# Patient Record
Sex: Male | Born: 1945
Health system: Southern US, Community
[De-identification: ages and names within clinical notes are randomized; demographics above are authoritative.]

## PROBLEM LIST (undated history)

## (undated) DIAGNOSIS — I7090 Unspecified atherosclerosis: Secondary | ICD-10-CM

## (undated) DIAGNOSIS — Z860100 Personal history of colon polyps, unspecified: Secondary | ICD-10-CM

## (undated) DIAGNOSIS — C609 Malignant neoplasm of penis, unspecified: Secondary | ICD-10-CM

## (undated) DIAGNOSIS — Z974 Presence of external hearing-aid: Secondary | ICD-10-CM

## (undated) DIAGNOSIS — K219 Gastro-esophageal reflux disease without esophagitis: Secondary | ICD-10-CM

## (undated) DIAGNOSIS — Z8719 Personal history of other diseases of the digestive system: Secondary | ICD-10-CM

## (undated) DIAGNOSIS — K449 Diaphragmatic hernia without obstruction or gangrene: Secondary | ICD-10-CM

## (undated) DIAGNOSIS — C801 Malignant (primary) neoplasm, unspecified: Secondary | ICD-10-CM

## (undated) DIAGNOSIS — R399 Unspecified symptoms and signs involving the genitourinary system: Secondary | ICD-10-CM

## (undated) DIAGNOSIS — N400 Enlarged prostate without lower urinary tract symptoms: Secondary | ICD-10-CM

## (undated) DIAGNOSIS — I440 Atrioventricular block, first degree: Secondary | ICD-10-CM

## (undated) DIAGNOSIS — N471 Phimosis: Secondary | ICD-10-CM

## (undated) DIAGNOSIS — Z8619 Personal history of other infectious and parasitic diseases: Secondary | ICD-10-CM

## (undated) DIAGNOSIS — I2510 Atherosclerotic heart disease of native coronary artery without angina pectoris: Secondary | ICD-10-CM

## (undated) DIAGNOSIS — C61 Malignant neoplasm of prostate: Secondary | ICD-10-CM

## (undated) DIAGNOSIS — K5792 Diverticulitis of intestine, part unspecified, without perforation or abscess without bleeding: Secondary | ICD-10-CM

## (undated) DIAGNOSIS — I10 Essential (primary) hypertension: Secondary | ICD-10-CM

## (undated) DIAGNOSIS — Z973 Presence of spectacles and contact lenses: Secondary | ICD-10-CM

## (undated) DIAGNOSIS — A630 Anogenital (venereal) warts: Secondary | ICD-10-CM

## (undated) DIAGNOSIS — G4730 Sleep apnea, unspecified: Secondary | ICD-10-CM

## (undated) DIAGNOSIS — I447 Left bundle-branch block, unspecified: Secondary | ICD-10-CM

## (undated) DIAGNOSIS — N138 Other obstructive and reflux uropathy: Secondary | ICD-10-CM

## (undated) DIAGNOSIS — N529 Male erectile dysfunction, unspecified: Secondary | ICD-10-CM

## (undated) HISTORY — DX: Malignant (primary) neoplasm, unspecified: C80.1

## (undated) HISTORY — DX: Essential (primary) hypertension: I10

## (undated) HISTORY — PX: PROSTATE SURGERY: SHX751

## (undated) HISTORY — DX: Gastro-esophageal reflux disease without esophagitis: K21.9

## (undated) HISTORY — PX: COLONOSCOPY: SHX174

## (undated) HISTORY — DX: Diverticulitis of intestine, part unspecified, without perforation or abscess without bleeding: K57.92

## (undated) HISTORY — PX: HERNIA REPAIR: SHX51

## (undated) HISTORY — PX: ABDOMINAL HERNIA REPAIR: SHX539

## (undated) HISTORY — DX: Unspecified atherosclerosis: I70.90

## (undated) HISTORY — DX: Malignant neoplasm of prostate: C61

## (undated) HISTORY — PX: OTHER SURGICAL HISTORY: SHX169

---

## 2000-08-22 ENCOUNTER — Encounter (INDEPENDENT_AMBULATORY_CARE_PROVIDER_SITE_OTHER): Payer: Self-pay

## 2000-08-22 ENCOUNTER — Encounter: Payer: Self-pay | Admitting: Gastroenterology

## 2000-08-22 ENCOUNTER — Encounter: Payer: Self-pay | Admitting: Internal Medicine

## 2000-08-22 ENCOUNTER — Ambulatory Visit (HOSPITAL_COMMUNITY): Admission: RE | Admit: 2000-08-22 | Discharge: 2000-08-22 | Payer: Self-pay | Admitting: Gastroenterology

## 2005-11-28 ENCOUNTER — Encounter: Payer: Self-pay | Admitting: Internal Medicine

## 2005-11-28 ENCOUNTER — Encounter: Payer: Self-pay | Admitting: Gastroenterology

## 2007-01-24 HISTORY — PX: PROSTATE SURGERY: SHX751

## 2007-01-24 HISTORY — PX: CT RADIATION THERAPY GUIDE: HXRAD513

## 2008-01-20 ENCOUNTER — Encounter: Payer: Self-pay | Admitting: Internal Medicine

## 2008-01-20 ENCOUNTER — Encounter: Payer: Self-pay | Admitting: Gastroenterology

## 2008-09-01 ENCOUNTER — Ambulatory Visit: Payer: Self-pay | Admitting: Gastroenterology

## 2008-09-02 ENCOUNTER — Encounter: Payer: Self-pay | Admitting: Gastroenterology

## 2008-09-10 ENCOUNTER — Ambulatory Visit: Payer: Self-pay | Admitting: Gastroenterology

## 2008-09-10 ENCOUNTER — Telehealth (INDEPENDENT_AMBULATORY_CARE_PROVIDER_SITE_OTHER): Payer: Self-pay

## 2008-09-10 DIAGNOSIS — K2970 Gastritis, unspecified, without bleeding: Secondary | ICD-10-CM | POA: Insufficient documentation

## 2008-09-10 DIAGNOSIS — K2990 Gastroduodenitis, unspecified, without bleeding: Secondary | ICD-10-CM

## 2008-09-10 LAB — CONVERTED CEMR LAB: UREASE: NEGATIVE

## 2008-09-11 ENCOUNTER — Encounter (INDEPENDENT_AMBULATORY_CARE_PROVIDER_SITE_OTHER): Payer: Self-pay

## 2008-09-15 ENCOUNTER — Encounter: Payer: Self-pay | Admitting: Gastroenterology

## 2008-10-05 ENCOUNTER — Telehealth: Payer: Self-pay | Admitting: Gastroenterology

## 2008-11-12 ENCOUNTER — Ambulatory Visit: Payer: Self-pay | Admitting: Gastroenterology

## 2008-11-12 DIAGNOSIS — R197 Diarrhea, unspecified: Secondary | ICD-10-CM | POA: Insufficient documentation

## 2008-11-12 DIAGNOSIS — K219 Gastro-esophageal reflux disease without esophagitis: Secondary | ICD-10-CM | POA: Insufficient documentation

## 2009-08-21 ENCOUNTER — Encounter: Payer: Self-pay | Admitting: Internal Medicine

## 2009-10-08 ENCOUNTER — Encounter: Admission: RE | Admit: 2009-10-08 | Discharge: 2009-10-08 | Payer: Self-pay | Admitting: Otolaryngology

## 2009-11-23 ENCOUNTER — Encounter: Payer: Self-pay | Admitting: Internal Medicine

## 2009-11-23 DIAGNOSIS — C61 Malignant neoplasm of prostate: Secondary | ICD-10-CM

## 2009-11-23 HISTORY — DX: Malignant neoplasm of prostate: C61

## 2009-11-30 ENCOUNTER — Ambulatory Visit
Admission: RE | Admit: 2009-11-30 | Discharge: 2010-01-10 | Payer: Self-pay | Source: Home / Self Care | Attending: Radiation Oncology | Admitting: Radiation Oncology

## 2009-12-20 ENCOUNTER — Telehealth (INDEPENDENT_AMBULATORY_CARE_PROVIDER_SITE_OTHER): Payer: Self-pay | Admitting: *Deleted

## 2009-12-21 ENCOUNTER — Encounter: Payer: Self-pay | Admitting: Internal Medicine

## 2009-12-21 ENCOUNTER — Ambulatory Visit: Payer: Self-pay | Admitting: Internal Medicine

## 2009-12-21 DIAGNOSIS — K4090 Unilateral inguinal hernia, without obstruction or gangrene, not specified as recurrent: Secondary | ICD-10-CM | POA: Insufficient documentation

## 2009-12-21 DIAGNOSIS — C61 Malignant neoplasm of prostate: Secondary | ICD-10-CM | POA: Insufficient documentation

## 2009-12-22 ENCOUNTER — Encounter: Payer: Self-pay | Admitting: Gastroenterology

## 2009-12-23 ENCOUNTER — Ambulatory Visit (HOSPITAL_COMMUNITY)
Admission: RE | Admit: 2009-12-23 | Discharge: 2009-12-23 | Payer: Self-pay | Source: Home / Self Care | Admitting: Surgery

## 2009-12-23 HISTORY — PX: INGUINAL HERNIA REPAIR: SUR1180

## 2009-12-24 ENCOUNTER — Telehealth: Payer: Self-pay | Admitting: Gastroenterology

## 2009-12-31 ENCOUNTER — Encounter: Payer: Self-pay | Admitting: Internal Medicine

## 2010-01-05 ENCOUNTER — Ambulatory Visit: Payer: Self-pay | Admitting: Internal Medicine

## 2010-01-11 ENCOUNTER — Encounter: Payer: Self-pay | Admitting: Internal Medicine

## 2010-01-28 ENCOUNTER — Encounter: Payer: Self-pay | Admitting: Internal Medicine

## 2010-02-14 ENCOUNTER — Ambulatory Visit
Admission: RE | Admit: 2010-02-14 | Discharge: 2010-02-14 | Payer: Self-pay | Source: Home / Self Care | Attending: Internal Medicine | Admitting: Internal Medicine

## 2010-02-14 ENCOUNTER — Ambulatory Visit
Admission: RE | Admit: 2010-02-14 | Discharge: 2010-02-22 | Payer: Self-pay | Source: Home / Self Care | Attending: Radiation Oncology | Admitting: Radiation Oncology

## 2010-02-22 NOTE — Miscellaneous (Signed)
Summary: omeprazole refill  Clinical Lists Changes  Medications: Changed medication from OMEPRAZOLE 40 MG  CPDR (OMEPRAZOLE) 1 each day 30 minutes before breakfast to OMEPRAZOLE 40 MG  CPDR (OMEPRAZOLE) 1 each day 30 minutes before breakfast NEEDS OFFICE VISIT FOR ANY FURTHER REFILLS!! - Signed Rx of OMEPRAZOLE 40 MG  CPDR (OMEPRAZOLE) 1 each day 30 minutes before breakfast NEEDS OFFICE VISIT FOR ANY FURTHER REFILLS!!;  #30 x 0;  Signed;  Entered by: Christie Nottingham CMA (AAMA);  Authorized by: Meryl Dare MD Clementeen Graham;  Method used: Electronically to [facility]*, [address], [place], [place]  [id number], Ph: [phone], Fax: [phone]    Prescriptions: OMEPRAZOLE 40 MG  CPDR (OMEPRAZOLE) 1 each day 30 minutes before breakfast NEEDS OFFICE VISIT FOR ANY FURTHER REFILLS!!  #30 x 0   Entered by:   Christie Nottingham CMA (AAMA)   Authorized by:   Meryl Dare MD [facility]   Signed by:   Christie Nottingham CMA Duncan Dull) on 12/22/2009   Method used:   Electronically to        [facility]* (retail)       [address]       [place], [place]  [address]       Ph: [phone]       Fax: [phone]   RxID:   [id number]

## 2010-02-22 NOTE — Progress Notes (Signed)
Summary: Medication  Phone Note Call from Patient Call back at Home Phone [phone]   Caller: Patient Call For: Dr. Russella Dar Reason for Call: Talk to Nurse Summary of Call: Wants to know why the dose was increased on his OMEPRAZOLE, says we used to call in 20mg  and now we are calling in 40mg  Initial call taken by: [place] Johnson,  December 24, 2009 8:55 AM  Follow-up for Phone Call        Pt was switched to omeprazole 40mg  at his last office visit. Pt is due for a yearly follow-up visit and informed that he needs to be seen in the office soon to get his yearly refills. Pt states he just had a  prostate biopsy yesterday and cannot move around much. He states he needs to see Dr. Debby Bud for a follow up anyway and wants to know if he can just see Dr. Debby Bud for this follow up. I told pt that Dr. Russella Dar states he can see his PCP for yearly follow up refills on his PPI instead of coming to see two doctors for refills and follow visits if that is easier on him. Pt agreed that it was and will call Dr. Debby Bud office for a follow-up visit.  Follow-up by: Christie Nottingham CMA Duncan Dull),  December 24, 2009 9:17 AM

## 2010-02-22 NOTE — Letter (Signed)
Summary: Appt Reminder 2  [facility]  [address] [place], [place] [address]   Phone: [phone]  Fax: [phone]        September 11, 2008 MRN: [id number]    Oscar Smith [address] [place], [place]  [address]    Dear Mr. Dascenzo,   You have a return appointment with Dr. Russella Dar on 11-12-08 at 8:45am.  Please remember to bring a complete list of the medicines you are taking, your insurance card and your co-pay.  If you have to cancel or reschedule this appointment, please call before 5:00 pm the evening before to avoid a cancellation fee.  If you have any questions or concerns, please call [phone].    Sincerely,    Darcey Nora RN, CGRN

## 2010-02-22 NOTE — Letter (Signed)
Summary: Patient [facility] Biopsy Results  [facility]  [address] [place], [place] [address]   Phone: [phone]  Fax: [phone]        September 15, 2008 MRN: [id number]    Oscar Smith [address] [place], [place]  [address]    Dear Mr. Lizardi,  I am pleased to inform you that the biopsies taken during your recent endoscopic examination did not show any evidence of H. pylori infection.  Continue with the treatment plan as outlined on the day of your      exam.  Please call us if you are having persistent problems or have questions about your condition that have not been fully answered at this time.  Sincerely,  Meryl Dare MD [facility]  This letter has been electronically signed by your physician.

## 2010-02-22 NOTE — Procedures (Signed)
Summary: EGD w/ biopsy/[facility]  EGD w/ biopsy/[facility]   Imported By: Sherian Rein 10/07/2008 09:48:01  _____________________________________________________________________  External Attachment:    Type:   Image     Comment:   External Document

## 2010-02-22 NOTE — Miscellaneous (Signed)
Summary: clotest  Clinical Lists Changes  Problems: Added new problem of GASTRITIS (ICD-535.50) Orders: Added new Test order of TLB-H Pylori Screen Gastric Biopsy (83013-CLOTEST) - Signed 

## 2010-02-22 NOTE — Miscellaneous (Signed)
Summary: Omeprazole order  Clinical Lists Changes  Medications: Added new medication of OMEPRAZOLE 40 MG  CPDR (OMEPRAZOLE) 1 each day 30 minutes before breakfast - Signed Rx of OMEPRAZOLE 40 MG  CPDR (OMEPRAZOLE) 1 each day 30 minutes before breakfast;  #30 x 11;  Signed;  Entered by: Doristine Church RN II;  Authorized by: Meryl Dare MD Clementeen Graham;  Method used: Electronically to [facility]*, [address], [place], [place]  [address], Ph: [phone], Fax: [phone] Observations: Added new observation of ALLERGY REV: Done (09/10/2008 10:16) Added new observation of NKA: T (09/10/2008 10:16)    Prescriptions: OMEPRAZOLE 40 MG  CPDR (OMEPRAZOLE) 1 each day 30 minutes before breakfast  #30 x 11   Entered by:   Doristine Church RN II   Authorized by:   Meryl Dare MD [facility]   Signed by:   Doristine Church RN II on 09/10/2008   Method used:   Electronically to        [facility]* (retail)       [address]       [place], [place]  [id number]       Ph: [phone]       Fax: [phone]   RxID:   [id number]

## 2010-02-22 NOTE — Assessment & Plan Note (Signed)
Summary: follow up EGD/Sheri   History of Present Illness Visit Type: Initial Visit Primary GI MD: Elie Goody MD [facility] Primary Provider: Elvina Sidle, MD Chief Complaint: Patient here today to follow up from an EGD, he states he is having no problems with acid reflux. He is against any rxs and would like to do only natural drug, he has changed his diet and feels much better. He is having trouble with diarrhea but states that he only has a BM once every 2 to 3 days. He has no abdominal pain and no blood in his stool.  History of Present Illness:   This is a return visit for erosive esophagitis. He has had excellent control of GERD with antireflux measures and daily omeprazole. He has a long history of intermittent diarrhea related to certain foods, and, since beginning a low fat diet he has not had any diarrhea.   GI Review of Systems      Denies abdominal pain, acid reflux, belching, bloating, chest pain, dysphagia with liquids, dysphagia with solids, heartburn, loss of appetite, nausea, vomiting, vomiting blood, weight loss, and  weight gain.      Reports diarrhea.     Denies anal fissure, black tarry stools, change in bowel habit, constipation, diverticulosis, fecal incontinence, heme positive stool, hemorrhoids, irritable bowel syndrome, jaundice, light color stool, liver problems, rectal bleeding, and  rectal pain. Preventive Screening-Counseling & Management  Alcohol-Tobacco     Smoking Status: quit  Caffeine-Diet-Exercise     Does Patient Exercise: yes      Drug Use:  no.     Current Medications (verified): 1)  Omeprazole 40 Mg  Cpdr (Omeprazole) .Marland Kitchen.. 1 Each Day 30 Minutes Before Breakfast 2)  Multivitamins  Tabs (Multiple Vitamin) .... Take One By Mouth Once Daily 3)  Omega-3 350 Mg Caps (Omega-3 Fatty Acids) .... Take One By Mouth Once Daily  Allergies (verified): No Known Drug Allergies  Past History:  Past Medical History: GERD/EE Hiatal  Hernia Diverticulosis Internal/external hemorrhoids  Past Surgical History: Unremarkable  Family History: Family History of Stomach Cancer: father died age 65 Family History of Colon Cancer: maternal nephews X 2  Social History: Patient is a former smoker. teens Alcohol Use - yes one or two beers a day Daily Caffeine Use dronks acid free coffee Illicit Drug Use - no Patient gets regular exercise. Smoking Status:  quit Drug Use:  no Does Patient Exercise:  yes  Review of Systems       The patient complains of allergy/sinus, hearing problems, heart rhythm changes, and urination - excessive.         The pertinent positives and negatives are noted as above and in the HPI. All other ROS were reviewed and were negative.   Vital Signs:  Patient profile:   65 year old male Height:      70 inches Weight:      185.6 pounds BMI:     26.73 Pulse rate:   80 / minute Pulse rhythm:   regular BP sitting:   120 / 78  (left arm) Cuff size:   regular  Vitals Entered By: Harlow Mares CMA Duncan Dull) (November 12, 2008 8:44 AM)  Physical Exam  General:  Well developed, well nourished, no acute distress. Head:  Normocephalic and atraumatic. Eyes:  PERRLA, no icterus. Mouth:  No deformity or lesions, dentition normal. Lungs:  Clear throughout to auscultation. Heart:  Regular rate and rhythm; no murmurs, rubs,  or bruits. Abdomen:  Soft, nontender and  nondistended. No masses, hepatosplenomegaly or hernias noted. Normal bowel sounds. Psych:  Alert and cooperative. Normal mood and affect.   Impression & Recommendations:  Problem # 1:  GERD (ICD-530.81) GERD with grade B. erosive esophagitis. We had a long discussion about the recurrent nature of her erosive esophagitis and potential complications, such as bleeding, strictures, or Barrett's esophagus. He is advised to remain on a proton pump inhibitor life long along with standard antireflux measures.  Problem # 2:  SCREENING  COLORECTAL-CANCER (ICD-V76.51) Colorectal cancer screening due November 2017.  Problem # 3:  DIARRHEA (ICD-787.91) Resolved with dietary changes. His diarrhea is most likely is caused by high-fat foods.  Patient Instructions: 1)  Please schedule a follow-up appointment in 1 year. 2)  Please continue current medications.  3)  Copy sent to : Elvina Sidle, MD 4)  The medication list was reviewed and reconciled.  All changed / newly prescribed medications were explained.  A complete medication list was provided to the patient / caregiver.  Appended Document: follow up EGD/Sheri    Clinical Lists Changes  Medications: Rx of OMEPRAZOLE 40 MG  CPDR (OMEPRAZOLE) 1 each day 30 minutes before breakfast;  #30 x 11;  Signed;  Entered by: Christie Nottingham CMA (AAMA);  Authorized by: Meryl Dare MD Clementeen Graham;  Method used: Electronically to [facility] [address] [place], [place]  [id number], Ph: [phone], Fax: [phone]    Prescriptions: OMEPRAZOLE 40 MG  CPDR (OMEPRAZOLE) 1 each day 30 minutes before breakfast  #30 x 11   Entered by:   Christie Nottingham CMA (AAMA)   Authorized by:   Meryl Dare MD [facility]   Signed by:   Christie Nottingham CMA Duncan Dull) on 11/12/2008   Method used:   Electronically to        [facility]* (retail)       [address]       [place], [place]  [address]       Ph: [phone]       Fax: [phone]   RxID:   [id number]

## 2010-02-22 NOTE — Procedures (Signed)
Summary: Endoscopy   EGD  Procedure date:  09/10/2008  Findings:      Location: [facility]   ENDOSCOPY PROCEDURE REPORT  PATIENT:  Oscar Smith  MR#:  [id number] BIRTHDATE:   1945/11/07, 63 yrs. old   GENDER:   male  ENDOSCOPIST:   Judie Petit T. Russella Dar, MD, [facility] Referred by: Elvina Sidle, M.D.  PROCEDURE DATE:  09/10/2008 PROCEDURE:  EGD with biopsy ASA CLASS:   Class II INDICATIONS: GERD   MEDICATIONS:   Fentanyl 50 mcg IV, Versed 7 mg IV TOPICAL ANESTHETIC:   Exactacain Spray  DESCRIPTION OF PROCEDURE:   After the risks benefits and alternatives of the procedure were thoroughly explained, informed consent was obtained.  The LB [id number]-H180 [id number] endoscope was introduced through the mouth and advanced to the second portion of the duodenum, without limitations.  The instrument was slowly withdrawn as the mucosa was fully examined. <<PROCEDUREIMAGES>>          <<OLD IMAGES>>  Esophagitis was found in the distal esophagus. It was linear arrayed and erosive.  LA Classification Grade B. Mild gastritis was found in the body of the stomach. It was erosive. A biopsy for H. pylori was taken.  Otherwise the examination was normal.   Retroflexed views revealed a hiatal hernia,  4 cm.  The scope was then withdrawn from the patient and the procedure completed.  COMPLICATIONS:   None  ENDOSCOPIC IMPRESSION:  1) Erosive esophagitis   2) Mild gastritis  3) A hiatal hernia  RECOMMENDATIONS:  1) await biopsy results  2) anti-reflux regimen  3) PPI qam indefinitely  4) follow-up: GI clinic 2 months     Samadhi Mahurin T. Russella Dar, MD, Clementeen Graham        Patient: Oscar Smith Note: All result statuses are Final unless otherwise noted.  Tests: (1) H. Pylori Screen Gastric Biopsy (CLOTEST)  H Pylori Screen Gastric Biopsy                             Negative                    Negative  Note: An exclamation mark (!) indicates a result that was not dispersed  into the flowsheet. Document Creation Date: 09/14/2008 5:09 PM _______________________________________________________________________  (1) Order result status: Final Collection or observation date-time: 09/10/2008 10:35 Requested date-time:  Receipt date-time: 09/14/2008 17:09 Reported date-time: 09/14/2008 17:10 Referring Physician:   Ordering Physician: Claudette Head [phone]) Specimen Source:  Source: Eduard Roux Order Number: [id number] Lab site:    Signed by Meryl Dare MD [facility] on 09/15/2008 at 9:41 AM    September 15, 2008 MRN: [id number]    [facility] [address] [place], [place]  [address]    Dear Mr. Mabile,  I am pleased to inform you that the biopsies taken during your recent endoscopic examination did not show any evidence of H. pylori infection.  Continue with the treatment plan as outlined on the day of your      exam.  Please call us if you are having persistent problems or have questions about your condition that have not been fully answered at this time.  Sincerely,  Meryl Dare MD [facility]  This letter has been electronically signed by your physician.   This report was created from the original endoscopy report, which was reviewed and signed by the above listed endoscopist.

## 2010-02-22 NOTE — Progress Notes (Signed)
Summary: meds not working  Phone Note Call from Patient Call back at Work Phone [phone]   Caller: Patient Call For: Oscar Smith Reason for Call: Talk to Nurse Summary of Call: Patient states that medication Omeprazole is not working , states that it makes his symptoms worse. Initial call taken by: Tawni Levy,  October 05, 2008 8:35 AM  Follow-up for Phone Call        patient states that the omeprazole gave him diarrhea, he stopped it over the weekend and did have some improvement. Patient is given samples of dexilant 60 mg and aciphex, he will try both and let us know what helps.  Patient  reports he has a hx of chronic diarrhea. He wonders if he has IBS.  I have offered to move up his appointment for 11-12-08, he wants to keep this appointment.  He will call back for RX of PPI that helps.   Follow-up by: Darcey Nora RN, CGRN,  October 05, 2008 9:27 AM    New/Updated Medications: DEXILANT 60 MG CPDR (DEXLANSOPRAZOLE) 1 by mouth ac breakfast ACIPHEX 20 MG  TBEC (RABEPRAZOLE SODIUM) Take 1 each day 30 minutes before meals

## 2010-02-22 NOTE — Procedures (Signed)
Summary: Colonoscopy/[facility]  Colonoscopy/[facility]   Imported By: Sherian Rein 10/07/2008 09:50:30  _____________________________________________________________________  External Attachment:    Type:   Image     Comment:   External Document

## 2010-02-22 NOTE — Miscellaneous (Signed)
Summary: Update colon recall  Clinical Lists Changes  Observations: Added new observation of COLONNXTDUE: 11/2015 (11/12/2008 8:52)

## 2010-02-22 NOTE — Letter (Signed)
Summary: Oscar Smith   Imported By: Lester Ivalee 12/27/2009 07:13:17  _____________________________________________________________________  External Attachment:    Type:   Image     Comment:   External Document

## 2010-02-22 NOTE — Progress Notes (Signed)
Summary: Handwritten notes/[facility]  Handwritten notes/[facility]   Imported By: Sherian Rein 10/07/2008 09:43:20  _____________________________________________________________________  External Attachment:    Type:   Image     Comment:   External Document

## 2010-02-22 NOTE — Procedures (Signed)
Summary: EGD/Msarc Shanon Ace MD  EGD/Msarc Shanon Ace MD   Imported By: Lester Coalmont 12/27/2009 07:29:25  _____________________________________________________________________  External Attachment:    Type:   Image     Comment:   External Document

## 2010-02-22 NOTE — Procedures (Signed)
Summary: Colon/Marc E Magod MD  Colon/Marc Shanon Ace MD   Imported By: Lester Hawthorne 12/27/2009 07:28:15  _____________________________________________________________________  External Attachment:    Type:   Image     Comment:   External Document

## 2010-02-22 NOTE — Procedures (Signed)
Summary: Colonoscopy w/ polypectomy/[facility]  Colonoscopy w/ polypectomy/[facility]   Imported By: Sherian Rein 10/07/2008 09:46:36  _____________________________________________________________________  External Attachment:    Type:   Image     Comment:   External Document

## 2010-02-22 NOTE — Letter (Signed)
Summary: [facility]   Imported By: Sherian Rein 10/07/2008 09:51:31  _____________________________________________________________________  External Attachment:    Type:   Image     Comment:   External Document

## 2010-02-22 NOTE — Miscellaneous (Signed)
Summary: [facility] Previsit/prep  Clinical Lists Changes  Observations: Added new observation of NKA: T (09/01/2008 9:02)

## 2010-02-22 NOTE — Procedures (Signed)
Summary: Colon/Marc E Magod MD  Colon/Marc Shanon Ace MD   Imported By: Lester Bacliff 12/27/2009 07:33:58  _____________________________________________________________________  External Attachment:    Type:   Image     Comment:   External Document

## 2010-02-22 NOTE — Assessment & Plan Note (Signed)
Summary: PER DR Albena Comes [place] WORK IN-NEW MEDICARE PT-#-PKG/OFF-STC   Vital Signs:  Patient profile:   65 year old male Height:      70 inches Weight:      188 pounds BMI:     27.07 O2 Sat:      98 % on Room air Temp:     98.5 degrees F oral Pulse rate:   75 / minute BP sitting:   138 / 80  (left arm) Cuff size:   regular  Vitals Entered By: Bill Salinas CMA (December 21, 2009 11:31 AM)  O2 Flow:  Room air  Primary Care Provider:  Elvina Sidle, MD   History of Present Illness: Patient presents to establish for on-going continuity care.  He has chronic hearing loss and does wear hearing aids.  He has had EGD with erosive esophagitis and is taking PPI therapy which he should take daily.  He has been diagnosed with low grade prostate cancer and is in the process of consultaiton with radiation oncology and his urologist re: potential treatment but will be waiting to January for repeat PSA  Patient has been diagnosed with a right inquinal hernia. He has an appointment with Dr. Luisa Hart next week but he is having increased pain at rest and with activity.  Preventive Screening-Counseling & Management  Alcohol-Tobacco     Alcohol drinks/day: 3     Alcohol type: wine     >5/day in last 3 mos: yes     Smoking Status: quit  Caffeine-Diet-Exercise     Caffeine use/day: 1 cup per day     Does Patient Exercise: no  Hep-HIV-STD-Contraception     Dental Visit-last 6 months yes     Sun Exposure-Excessive: no  Safety-Violence-Falls     Seat Belt Use: yes     Helmet Use: n/a     Firearms in the Home: no firearms in the home     Smoke Detectors: yes     Violence in the Home: no risk noted     Fall Risk: low fall risk  Current Medications (verified): 1)  Omeprazole 40 Mg  Cpdr (Omeprazole) .Marland Kitchen.. 1 Each Day 30 Minutes Before Breakfast 2)  Multivitamins  Tabs (Multiple Vitamin) .... Take One By Mouth Once Daily 3)  Omega-3 350 Mg Caps (Omega-3 Fatty Acids) .... Take One By Mouth Once  Daily  Allergies (verified): No Known Drug Allergies  Past History:  Past Medical History: INGUINAL HERNIA, RIGHT (ICD-550.90) ADENOCARCINOMA, PROSTATE, GLEASON GRADE 6 (ICD-185) DIARRHEA (ICD-787.91) GERD (ICD-530.81) SCREENING COLORECTAL-CANCER (ICD-V76.51) GASTRITIS (ICD-535.50)  Social History: Trained as an Art gallery manager Married ''75 - 11 years/divorced; married '86 no children Worked as a Sales promotion account executive in [place] Patient is a former smoker. teens Alcohol Use - yes one or two beers a day Daily Caffeine Use dronks acid free coffee Illicit Drug Use - no Patient gets regular exercise. Caffeine use/day:  1 cup per day Does Patient Exercise:  no Dental Care w/in 6 mos.:  yes Sun Exposure-Excessive:  no Seat Belt Use:  yes Fall Risk:  low fall risk  Review of Systems       The patient complains of abdominal pain.  The patient denies anorexia, fever, weight loss, weight gain, chest pain, dyspnea on exertion, muscle weakness, and abnormal bleeding.    Physical Exam  General:  alert, well-developed, well-nourished, and well-hydrated.   Head:  normocephalic and atraumatic.   Eyes:  vision grossly intact, pupils equal, and pupils round.   Abdomen:  soft  and normal bowel sounds.  large right inquinal hernia very tender to touch. On inquinal exam very hard and tender hernia that was not reducible. Neurologic:  alert & oriented X3, cranial nerves II-XII intact, strength normal in all extremities, and gait normal.   Skin:  turgor normal, color normal, and no rashes.   Cervical Nodes:  no anterior cervical adenopathy and no posterior cervical adenopathy.   Psych:  Oriented X3, memory intact for recent and remote, and good eye contact.     Impression & Recommendations:  Problem # 1:  INGUINAL HERNIA, RIGHT (ICD-550.90) acute problem with pain and an irreducible hernia on my exam  Plan to see Dr Jamey Ripa at walk in clinic this PM at 1500hrs patient aware  Problem # 2:  ADENOCARCINOMA,  PROSTATE, GLEASON GRADE 6 (ICD-185) Prudence is the key. Repeat PSA January. Research treatment options including robotic nerve sparing prostatectomy vs porbe guided XRT  Problem # 3:  GASTRITIS (ICD-535.50) EGD proven erosive esophagitis and gastritis. Currently asymptomatic  Plan - continue PPI therapy.  His updated medication list for this problem includes:    Omeprazole 40 Mg Cpdr (Omeprazole) .Marland Kitchen... 1 each day 30 minutes before breakfast  Complete Medication List: 1)  Omeprazole 40 Mg Cpdr (Omeprazole) .Marland Kitchen.. 1 each day 30 minutes before breakfast 2)  Multivitamins Tabs (Multiple vitamin) .... Take one by mouth once daily 3)  Omega-3 350 Mg Caps (Omega-3 fatty acids) .... Take one by mouth once daily   Orders Added: 1)  New Patient Level III [04540]

## 2010-02-22 NOTE — Progress Notes (Signed)
----   Converted from flag ---- ---- 12/15/2009 5:26 PM, Jacques Navy MD wrote: next week is fine  ---- 12/15/2009 12:17 PM, Ivar Bury wrote: Dr Debby Bud  --Oscar Smith will be a new medicare pt with a hernia /groin issue, requesting a prim care phy.--Your next new availability is 01/28/10.  Can I work pt in sooner?  Thanks for your reply ------------------------------ Gave pt appt--phone--:  12/21/09 @ 1110A w/Dr Debby Bud

## 2010-02-23 ENCOUNTER — Ambulatory Visit: Payer: Medicare Other | Attending: Radiation Oncology | Admitting: Radiation Oncology

## 2010-02-23 ENCOUNTER — Ambulatory Visit: Payer: Medicare Other | Admitting: Radiation Oncology

## 2010-02-23 DIAGNOSIS — Z510 Encounter for antineoplastic radiation therapy: Secondary | ICD-10-CM | POA: Insufficient documentation

## 2010-02-23 DIAGNOSIS — C61 Malignant neoplasm of prostate: Secondary | ICD-10-CM | POA: Insufficient documentation

## 2010-02-24 ENCOUNTER — Ambulatory Visit: Payer: Medicare Other | Admitting: Radiation Oncology

## 2010-02-24 NOTE — Letter (Signed)
Summary: [facility]   Imported By: Sherian Rein 01/25/2010 08:33:24  _____________________________________________________________________  External Attachment:    Type:   Image     Comment:   External Document

## 2010-02-24 NOTE — Letter (Signed)
Summary: [facility]   Imported By: Sherian Rein 01/25/2010 08:36:57  _____________________________________________________________________  External Attachment:    Type:   Image     Comment:   External Document

## 2010-02-24 NOTE — Assessment & Plan Note (Signed)
Summary: PER PT 2 WK FU---STC   Vital Signs:  Patient profile:   65 year old male Height:      70 inches Weight:      187 pounds BMI:     26.93 O2 Sat:      98 % on Room air Temp:     98.1 degrees F oral Pulse rate:   67 / minute BP sitting:   138 / 84  (left arm) Cuff size:   regular  Vitals Entered By: Bill Salinas CMA (January 05, 2010 11:03 AM)  O2 Flow:  Room air CC: follow-up visit after having surg on inguinal hernia surg/ ab   Primary Care Provider:  Rosalyn Gess. Gennesis Hogland  CC:  follow-up visit after having surg on inguinal hernia surg/ ab.  History of Present Illness: Patient recently seen as a new patient. He did have a chance to review office note with no additions or corrections.  In the interval since his visit he has had right inguinal  herniorrphy by Dr. Jamey Ripa. He did well and is healing nicely. He still feel some tenderness at the op site. He has questions about level of activity which is deferred to Dr. Jamey Ripa.  He continues to research options in regard to the diagnosis of prostate cancer. He located a urologist by internet review - Dr. Margo Aye in [place] with whom he has an appointment to discuss robotic surgery. I have suggested that for continuity of care he may want to consider seeing Dr. Laverle Patter here in [place] who is also an accomplished urological surgeon experienced in robotic procedures.  Current Medications (verified): 1)  Omeprazole 40 Mg  Cpdr (Omeprazole) .Marland Kitchen.. 1 Each Day 30 Minutes Before Breakfast Needs Office Visit For Any Further Refills!! 2)  Multivitamins  Tabs (Multiple Vitamin) .... Take One By Mouth Once Daily 3)  Omega-3 350 Mg Caps (Omega-3 Fatty Acids) .... Take One By Mouth Once Daily  Allergies (verified): No Known Drug Allergies PMH-FH-SH reviewed-no changes except otherwise noted  Review of Systems  The patient denies anorexia, fever, weight loss, chest pain, syncope, dyspnea on exertion, abdominal pain, hematuria, muscle weakness,  difficulty walking, abnormal bleeding, and enlarged lymph nodes.    Physical Exam  General:  Well-developed,well-nourished,in no acute distress; alert,appropriate and cooperative throughout examination Eyes:  pupils equal, pupils round, corneas and lenses clear, and no injection.   Neck:  supple and full ROM.   Lungs:  normal respiratory effort and normal breath sounds.   Heart:  normal rate and regular rhythm.   Abdomen:  soft, normal bowel sounds, and no guarding.  Well healed surgical scar right groin with a woody texture consistent with mesh repair  Msk:  normal ROM, no joint tenderness, and no joint swelling.   Pulses:  2+ Neurologic:  alert & oriented X3, cranial nerves II-XII intact, and gait normal.   Skin:  turgor normal and color normal.   Psych:  Oriented X3, memory intact for recent and remote, normally interactive, and not anxious appearing.     Impression & Recommendations:  Problem # 1:  INGUINAL HERNIA, RIGHT (ICD-550.90) Successful repain.  Problem # 2:  ADENOCARCINOMA, PROSTATE, GLEASON GRADE 6 (ICD-185) Patient is exploring his options. Based on our discussion - HPI - will refer him to Dr. Laverle Patter.  Orders: Urology Referral (Urology)  Complete Medication List: 1)  Omeprazole 40 Mg Cpdr (Omeprazole) .Marland Kitchen.. 1 each day 30 minutes before breakfast needs office visit for any further refills!! 2)  Multivitamins Tabs (Multiple vitamin) .Marland KitchenMarland KitchenMarland Kitchen  Take one by mouth once daily 3)  Omega-3 350 Mg Caps (Omega-3 fatty acids) .... Take one by mouth once daily Prescriptions: OMEPRAZOLE 40 MG  CPDR (OMEPRAZOLE) 1 each day 30 minutes before breakfast NEEDS OFFICE VISIT FOR ANY FURTHER REFILLS!!  #90 x 3   Entered and Authorized by:   Jacques Navy MD   Signed by:   Jacques Navy MD on 01/05/2010   Method used:   Electronically to        [facility]* (retail)       [address]       [place], [place]  [id number]       Ph: [phone]       Fax: [phone]    RxID:   [id number]    Orders Added: 1)  Urology Referral [Urology] 2)  Est. Patient Level III [24401]

## 2010-02-24 NOTE — Assessment & Plan Note (Signed)
Summary: FU  Natale Milch  #   Vital Signs:  Patient profile:   65 year old male Height:      70 inches Weight:      194 pounds BMI:     27.94 O2 Sat:      97 % on Room air Temp:     98.3 degrees F oral Pulse rate:   80 / minute BP sitting:   138 / 82  (left arm) Cuff size:   regular  Vitals Entered By: Bill Salinas CMA (February 14, 2010 3:04 PM)  O2 Flow:  Room air CC: 3 month f/u to discuss treatment for prostate cancer and discuss hernia/ ab   Primary Care Provider:  Rosalyn Gess. Deepa Barthel  CC:  3 month f/u to discuss treatment for prostate cancer and discuss hernia/ ab.  History of Present Illness: Mr. Carlisle Beers presents for follow-up. He has made a decision in regard to treatment for prostate cancer: he will go with XRT. He has had gold markers place in the prostate and will begin 35-40 XRT sessions Feb 1st. He is going for simulation this week.  Patient is about 8 weeks out from right inquinal hernia repair by Dr. Jamey Ripa. Mr. Stephannie Li reports that he will still have sharp pain with certain movement in the right inquinal area and feels like somthing pulls loose with stretching or flexing. He has had no percepitble bulge, no fever, no change in the surgical scar.  Current Medications (verified): 1)  Omeprazole 40 Mg  Cpdr (Omeprazole) .Marland Kitchen.. 1 Each Day 30 Minutes Before Breakfast Needs Office Visit For Any Further Refills!! 2)  Multivitamins  Tabs (Multiple Vitamin) .... Take One By Mouth Once Daily 3)  Omega-3 350 Mg Caps (Omega-3 Fatty Acids) .... Take One By Mouth Once Daily  Allergies (verified): No Known Drug Allergies PMH-FH-SH reviewed-no changes except otherwise noted  Review of Systems       The patient complains of abdominal pain.  The patient denies anorexia, fever, weight loss, chest pain, severe indigestion/heartburn, muscle weakness, suspicious skin lesions, and enlarged lymph nodes.    Physical Exam  General:  Well-developed,well-nourished,in no acute distress;  alert,appropriate and cooperative throughout examination Head:  no abnormalities observed.   Lungs:  normal respiratory effort and normal breath sounds.   Heart:  normal rate and regular rhythm.   Abdomen:  Well healed scar at right lower abdomen. Able to palpate stitch line vs mesh on palpation along the scar line. Right inquinal exam is normal with no bulge or tenderness.   Impression & Recommendations:  Problem # 1:  INGUINAL HERNIA, RIGHT (ICD-550.90) Patient with persistent discomfort and concern about whether he can resume physical activity. Exam is unremarkable.  Plan - Recommened that he contact Dr. Jamey Ripa with his questions and concerns.  Problem # 2:  ADENOCARCINOMA, PROSTATE, GLEASON GRADE 6 (ICD-185) Patient is planning to proceed with treatment as outlined in HPI.  Complete Medication List: 1)  Omeprazole 40 Mg Cpdr (Omeprazole) .Marland Kitchen.. 1 each day 30 minutes before breakfast needs office visit for any further refills!! 2)  Multivitamins Tabs (Multiple vitamin) .... Take one by mouth once daily 3)  Omega-3 350 Mg Caps (Omega-3 fatty acids) .... Take one by mouth once daily   Orders Added: 1)  Est. Patient Level III [[address]]

## 2010-02-24 NOTE — Consult Note (Signed)
Summary: [facility]   Imported By: Sherian Rein 02/08/2010 10:09:34  _____________________________________________________________________  External Attachment:    Type:   Image     Comment:   External Document

## 2010-04-05 LAB — CBC
HCT: 42.8 % (ref 39.0–52.0)
Hemoglobin: 15.4 g/dL (ref 13.0–17.0)
MCH: 32 pg (ref 26.0–34.0)
MCHC: 36 g/dL (ref 30.0–36.0)
MCV: 88.8 fL (ref 78.0–100.0)
Platelets: 171 10*3/uL (ref 150–400)
RBC: 4.82 MIL/uL (ref 4.22–5.81)
RDW: 12.4 % (ref 11.5–15.5)
WBC: 5.9 10*3/uL (ref 4.0–10.5)

## 2010-04-05 LAB — SURGICAL PCR SCREEN
MRSA, PCR: NEGATIVE
Staphylococcus aureus: POSITIVE — AB

## 2010-06-10 NOTE — Procedures (Signed)
[facility]  Patient:    Oscar Smith                 MRN: [id number] Proc. Date: 08/22/00 Adm. Date:  [id number] Attending:  Nelda Marseille                           Procedure Report  PROCEDURE:  Colonoscopy with polypectomy.  INDICATION:  Periodic diarrhea in a patient due for colonic screening. Consent was signed after risks, benefits, methods, and options were thoroughly discussed in the office.  MEDICATIONS:  Demerol 70, Versed 6.  PROCEDURE:  Rectal inspection was pertinent for external hemorrhoids, small. Digital examination was negative.  Video colonoscope was inserted, easily advanced around the colon to the cecum.  This did not require any abdominal pressure or any position changes.  Cecum was identified by the appendiceal orifice and ileocecal valve.  No obvious abnormality was seen on insertion. The scope was slowly withdrawn.  The prep was adequate.  There was some liquid stool that required washing and suctioning.   Cecum and ascending were normal. At the approximate level of the hepatic flexure, a tiny 1-2 mm polyp was seen and was cold-biopsied x 1.  The scope was further withdrawn.  In the rectal distal sigmoid area, were three tiny polyps, all of which were hot-biopsied x 1 or 2 and they were all put in the same container.  Once back in the rectum, the scope was retroflexed, pertinent for some small internal hemorrhoids.  The scope was straightened, readvanced a short ways up the sigmoid, air was suctioned, and scope removed.  The patient tolerated the procedure well.  There were no obvious immediate complication.  ENDOSCOPIC DIAGNOSES: 1. Internal and external hemorrhoids. 2. Three tiny rectum and distal sigmoid small polyps hot-biopsied. 3. Questionable tiny hepatic flexure polyp, cold-biopsied. 4. Otherwise within normal limits to the cecum.  PLAN:  Await pathology to determine future colonic screening.   Continue work-up with an EGD.  Please see that dictation for details. DD:  08/22/00 TD:  08/22/00 Job: [id number]

## 2010-06-30 ENCOUNTER — Ambulatory Visit
Admission: RE | Admit: 2010-06-30 | Discharge: 2010-06-30 | Disposition: A | Discharge status: Home / Self Care | Payer: Medicare Other | Source: Ambulatory Visit | Attending: Radiation Oncology | Admitting: Radiation Oncology

## 2010-09-05 ENCOUNTER — Encounter: Payer: Self-pay | Admitting: Internal Medicine

## 2010-09-05 ENCOUNTER — Ambulatory Visit (INDEPENDENT_AMBULATORY_CARE_PROVIDER_SITE_OTHER): Payer: Medicare Other | Admitting: Internal Medicine

## 2010-09-05 VITALS — BP 116/80 | HR 88 | Temp 97.2°F | Wt 184.0 lb

## 2010-09-05 DIAGNOSIS — R059 Cough, unspecified: Secondary | ICD-10-CM

## 2010-09-05 DIAGNOSIS — R05 Cough: Secondary | ICD-10-CM

## 2010-09-05 DIAGNOSIS — J157 Pneumonia due to Mycoplasma pneumoniae: Secondary | ICD-10-CM

## 2010-09-05 MED ORDER — AZITHROMYCIN 250 MG PO TABS: ORAL_TABLET | ORAL | Status: AC

## 2010-09-05 NOTE — Progress Notes (Signed)
  Subjective:    Patient ID: Oscar Smith, male    DOB: 1946-01-22, 65 y.o.   MRN: [id number]  HPI Patient presents with a 10 day h/o cough, dry and non-productive, sinus fullness, weakness and loss of energy. He has had intermittent low grade fevers. No rigors. No DOE. He has decreased exercise tolerance in general after six months of limited activity due to hernia repair and recovery and XRT for prostate cancer.   I have reviewed the patient's medical history in detail and updated the computerized patient record.      Review of Systems Review of Systems  Constitutional:  Positive for low grade fever,activity change. Negative for rigors or unexpected weight change.  HEENT:  Negative for hearing loss, ear pain, congestion, neck stiffness and postnasal drip. Negative for sore throat or swallowing problems. Negative for dental complaints.   Eyes: Negative for vision loss or change in visual acuity.  Respiratory: Negative for chest tightness and wheezing.   Cardiovascular: Negative for chest pain and palpitation. Positive for decreased exercise tolerance Gastrointestinal: No change in bowel habit. No bloating or gas. No reflux or indigestion Genitourinary: Negative for urgency, frequency, flank pain and difficulty urinating.  Musculoskeletal: Negative for myalgias, back pain, arthralgias and gait problem.  Neurological: Negative for dizziness, tremors, weakness and headaches.  Hematological: Negative for adenopathy.  Psychiatric/Behavioral: Negative for behavioral problems and dysphoric mood.       Objective:   Physical Exam Vitals noted Gen'l - a little haggard in appearance but in no acute distress HEENT - no sinus tenderness, C&S clear, throat clear Neck- supple Chest - CTAP Cor - RRR       Assessment & Plan:  mycoplasma pneumoniae - symptoms are c/w this diagnosis.  Plan - azithromycin as directed.

## 2010-09-06 ENCOUNTER — Telehealth: Payer: Self-pay | Admitting: Internal Medicine

## 2010-09-06 NOTE — Telephone Encounter (Signed)
Received copies from [facility] on 09/06/10. Forwarded  3pages to Dr. Debby Bud for review.

## 2010-09-16 ENCOUNTER — Telehealth: Payer: Self-pay

## 2010-09-16 MED ORDER — BENZONATATE 100 MG PO CAPS: 100.0000 mg | ORAL_CAPSULE | Freq: Three times a day (TID) | ORAL | Status: AC | PRN

## 2010-09-16 MED ORDER — PREDNISONE 10 MG PO TABS: ORAL_TABLET | ORAL | Status: DC

## 2010-09-16 NOTE — Telephone Encounter (Signed)
Patient called [facility] c/o continued cough and congestion after completeing rx. He would like to know what else MD suggest. Thanks

## 2010-09-16 NOTE — Telephone Encounter (Signed)
Per Scheduling, pt called back and has also been set up for Saturday clinic just incase MD feels that he needs to be seen

## 2010-09-16 NOTE — Telephone Encounter (Signed)
Called patient  - sounds like a tracheal irritative cough.   Plan - tessalon perles tid x 10           Prednisone 30mg  x 1, 20omg x 3, 10 mg x 6           Robitussin DM q6          OV next week if not better.

## 2010-09-17 ENCOUNTER — Ambulatory Visit: Payer: Medicare Other | Admitting: Family Medicine

## 2010-09-22 ENCOUNTER — Encounter: Payer: Self-pay | Admitting: Internal Medicine

## 2010-09-22 ENCOUNTER — Ambulatory Visit (INDEPENDENT_AMBULATORY_CARE_PROVIDER_SITE_OTHER): Payer: Medicare Other | Admitting: Internal Medicine

## 2010-09-22 DIAGNOSIS — J309 Allergic rhinitis, unspecified: Secondary | ICD-10-CM

## 2010-09-22 NOTE — Progress Notes (Signed)
  Subjective:    Patient ID: Oscar Smith, male    DOB: April 05, 1945, 65 y.o.   MRN: [id number]  HPI patinet has been seen for URI and then cyclical cough for which he is finishing prednisone and tessalon perles. He will still have drainage and cough and thinks he may have developed allergies. He also c/o burning mouth and is worry about thrush.   I have reviewed the patient's medical history in detail and updated the computerized patient record.    Review of Systems System review is negative for any constitutional, cardiac, pulmonary, GI or neuro symptoms or complaints     Objective:   Physical Exam Vitals noted Gen'l- WNWD white man  HEENT - no plaque on the tongue or buccal membranes.       Assessment & Plan:  Cough - finish prednisone and tessalon  Allergy - symptoms are behaving like allergy.  Plan - otc allegra or claritin.

## 2010-09-24 DIAGNOSIS — J309 Allergic rhinitis, unspecified: Secondary | ICD-10-CM | POA: Insufficient documentation

## 2010-10-19 ENCOUNTER — Ambulatory Visit: Payer: Medicare Other

## 2010-10-21 ENCOUNTER — Ambulatory Visit (INDEPENDENT_AMBULATORY_CARE_PROVIDER_SITE_OTHER): Payer: Medicare Other

## 2010-10-21 DIAGNOSIS — Z23 Encounter for immunization: Secondary | ICD-10-CM

## 2010-10-31 ENCOUNTER — Encounter: Payer: Medicare Other | Admitting: Internal Medicine

## 2010-12-22 ENCOUNTER — Ambulatory Visit (INDEPENDENT_AMBULATORY_CARE_PROVIDER_SITE_OTHER): Payer: Medicare Other | Admitting: Internal Medicine

## 2010-12-22 ENCOUNTER — Encounter: Payer: Self-pay | Admitting: Internal Medicine

## 2010-12-22 ENCOUNTER — Other Ambulatory Visit: Payer: Self-pay | Admitting: Internal Medicine

## 2010-12-22 ENCOUNTER — Other Ambulatory Visit (INDEPENDENT_AMBULATORY_CARE_PROVIDER_SITE_OTHER): Payer: Medicare Other

## 2010-12-22 DIAGNOSIS — K219 Gastro-esophageal reflux disease without esophagitis: Secondary | ICD-10-CM

## 2010-12-22 DIAGNOSIS — K2970 Gastritis, unspecified, without bleeding: Secondary | ICD-10-CM

## 2010-12-22 DIAGNOSIS — Z136 Encounter for screening for cardiovascular disorders: Secondary | ICD-10-CM

## 2010-12-22 DIAGNOSIS — Z0000 Encounter for general adult medical examination without abnormal findings: Secondary | ICD-10-CM

## 2010-12-22 DIAGNOSIS — L299 Pruritus, unspecified: Secondary | ICD-10-CM

## 2010-12-22 DIAGNOSIS — K4090 Unilateral inguinal hernia, without obstruction or gangrene, not specified as recurrent: Secondary | ICD-10-CM

## 2010-12-22 DIAGNOSIS — C61 Malignant neoplasm of prostate: Secondary | ICD-10-CM

## 2010-12-22 LAB — COMPREHENSIVE METABOLIC PANEL
ALT: 25 U/L (ref 0–53)
AST: 24 U/L (ref 0–37)
Albumin: 4 g/dL (ref 3.5–5.2)
Alkaline Phosphatase: 78 U/L (ref 39–117)
BUN: 13 mg/dL (ref 6–23)
CO2: 28 mEq/L (ref 19–32)
Calcium: 9.1 mg/dL (ref 8.4–10.5)
Chloride: 107 mEq/L (ref 96–112)
Creatinine, Ser: 1 mg/dL (ref 0.4–1.5)
GFR: 83.93 mL/min (ref >60.00)
Glucose, Bld: 91 mg/dL (ref 70–99)
Potassium: 4.3 mEq/L (ref 3.5–5.1)
Sodium: 140 mEq/L (ref 135–145)
Total Bilirubin: 0.7 mg/dL (ref 0.3–1.2)
Total Protein: 7.2 g/dL (ref 6.0–8.3)

## 2010-12-22 LAB — LIPID PANEL
Cholesterol: 208 mg/dL — ABNORMAL HIGH (ref 0–200)
HDL: 36.7 mg/dL — ABNORMAL LOW (ref >39.00)
Total CHOL/HDL Ratio: 6
Triglycerides: 186 mg/dL — ABNORMAL HIGH (ref 0.0–149.0)
VLDL: 37.2 mg/dL (ref 0.0–40.0)

## 2010-12-22 LAB — HEPATIC FUNCTION PANEL
ALT: 25 U/L (ref 0–53)
AST: 24 U/L (ref 0–37)
Albumin: 4 g/dL (ref 3.5–5.2)
Alkaline Phosphatase: 78 U/L (ref 39–117)
Bilirubin, Direct: 0.1 mg/dL (ref 0.0–0.3)
Total Bilirubin: 0.7 mg/dL (ref 0.3–1.2)
Total Protein: 7.2 g/dL (ref 6.0–8.3)

## 2010-12-22 LAB — LDL CHOLESTEROL, DIRECT: Direct LDL: 124.7 mg/dL

## 2010-12-22 MED ORDER — OMEPRAZOLE 40 MG PO CPDR: 40.0000 mg | DELAYED_RELEASE_CAPSULE | Freq: Every day | ORAL | Status: DC

## 2010-12-22 NOTE — Progress Notes (Signed)
Subjective:    Patient ID: Oscar Smith, male    DOB: [birth year], 65 y.o.   MRN: [id number]  HPI   The patient is here for annual Medicare wellness examination and management of other chronic and acute problems. He reports that he is doing well and has no active medical complaints. He is current with his urologist.   The risk factors are reflected in the social history.  The roster of all physicians providing medical care to patient - is listed in the Snapshot section of the chart.  Activities of daily living:  The patient is 100% inedpendent in all ADLs: dressing, toileting, feeding as well as independent mobility  Home safety : The patient has smoke detectors in the home. They wear seatbelts  No firearms at home. There is no violence in the home.   There is no risks for hepatitis, STDs or HIV. There is no   history of blood transfusion. They have no travel history to infectious disease endemic areas of the world.  The patient has seen their dentist in the last six month. They have seen their eye doctor in the last year. They admit to  hearing difficulty and have not had audiologic testing in the last year.  They do not  have excessive sun exposure. Discussed the need for sun protection: hats, long sleeves and use of sunscreen if there is significant sun exposure.   Diet: the importance of a healthy diet is discussed. They do have a healthy diet.  The patient has a regular exercise program: cardio , 30 min duration, 7 days per week.  The benefits of regular aerobic exercise were discussed.  Depression screen: there are no signs or vegative symptoms of depression- irritability, change in appetite, anhedonia, sadness/tearfullness.  Cognitive assessment: the patient manages all their financial and personal affairs and is actively engaged.He is engaged in his art and has taken up the piano.  The following portions of the patient's history were reviewed and updated as appropriate:  allergies, current medications, past family history, past medical history,  past surgical history, past social history  and problem list.  Past Medical History  Diagnosis Date  . Inguinal hernia     right  . Diarrhea   . GERD (gastroesophageal reflux disease)   . Gastritis   . Adenocarcinoma of prostate     Gleason Grade 6, XRT   Past Surgical History  Procedure Date  . Abdominal hernia repair    No family history on file. History   Social History  . Marital Status: Married    Spouse Name: N/A    Number of Children: N/A  . Years of Education: N/A   Occupational History  . Not on file.   Social History Main Topics  . Smoking status: Former Smoker    Quit date: 09/04/1965  . Smokeless tobacco: Never Used  . Alcohol Use: 10.5 oz/week    21 drink(s) per week  . Drug Use: No  . Sexually Active: Yes -- Male partner(s)   Other Topics Concern  . Not on file   Social History Narrative   Trained as an Art gallery manager. Married '75- 11 years, divorced; Married '86. No children. 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 Sculptor-life size figures by commission (see work in [employer] park.) Learning the piano. Patient get regular exercise.         Review of Systems Constitutional:  Negative for fever, chills, activity change and unexpected  weight change.  HEENT:  Negative for hearing loss, ear pain, congestion, neck stiffness and postnasal drip. Negative for sore throat or swallowing problems. Negative for dental complaints.   Eyes: Negative for vision loss or change in visual acuity.  Respiratory: Negative for chest tightness and wheezing. Negative for DOE.   Cardiovascular: Negative for chest pain or palpitations. No decreased exercise tolerance Gastrointestinal: No change in bowel habit. No bloating or gas. No reflux or indigestion Genitourinary: Negative for urgency, frequency, flank pain and difficulty urinating.   Musculoskeletal: Negative for myalgias, back pain, arthralgias and gait problem.  Neurological: Negative for dizziness, tremors, weakness and headaches.  Hematological: Negative for adenopathy.  Psychiatric/Behavioral: Negative for behavioral problems and dysphoric mood.       Objective:   Physical Exam Vital signs reviewed Gen'l: Well nourished well developed white male in no acute distress  HEENT: Head: Normocephalic and atraumatic. Right Ear: External ear normal. EAC/TM nl. Left Ear: External ear normal.  EAC/TM nl. Nose: Nose normal. Mouth/Throat: Oropharynx is clear and moist. Dentition - native, in good repair. No buccal or palatal lesions. Posterior pharynx clear. Eyes: Conjunctivae and sclera clear. EOM intact. Pupils are equal, round, and reactive to light. Right eye exhibits no discharge. Left eye exhibits no discharge. Neck: Normal range of motion. Neck supple. No JVD present. No tracheal deviation present. No thyromegaly present.  Cardiovascular: Normal rate, regular rhythm, no gallop, no friction rub, no murmur heard.      Quiet precordium. 2+ radial and DP pulses . No carotid bruits Pulmonary/Chest: Effort normal. No respiratory distress or increased WOB, no wheezes, no rales. No chest wall deformity or CVAT. Abdominal: Soft. Bowel sounds are normal in all quadrants. He exhibits no distension, no tenderness, no rebound or guarding, No heptosplenomegaly  Genitourinary: Deferred to Dr. Laverle Patter  Musculoskeletal: Normal range of motion. He exhibits no edema and no tenderness.       Small and large joints without redness, synovial thickening or deformity. Full range of motion preserved about all small, median and large joints.  Lymphadenopathy:    He has no cervical or supraclavicular adenopathy.  Neurological: He is alert and oriented to person, place, and time. CN II-XII intact. DTRs 2+ and symmetrical biceps, radial and patellar tendons. Cerebellar function normal with no tremor,  rigidity, normal gait and station.  Skin: Skin is warm and dry. No rash noted. No erythema.  Psychiatric: He has a normal mood and affect. His behavior is normal. Thought content normal.   Lab Results  Component Value Date   WBC 5.9 12/22/2009   HGB 15.4 12/22/2009   HCT 42.8 12/22/2009   PLT 171 12/22/2009   GLUCOSE 91 12/22/2010   CHOL 208* 12/22/2010   TRIG 186.0* 12/22/2010   HDL 36.70* 12/22/2010   LDLDIRECT 124.7 12/22/2010   ALT 25 12/22/2010   ALT 25 12/22/2010   AST 24 12/22/2010   AST 24 12/22/2010   NA 140 12/22/2010   K 4.3 12/22/2010   CL 107 12/22/2010   CREATININE 1.0 12/22/2010   BUN 13 12/22/2010   CO2 28 12/22/2010           Assessment & Plan:

## 2010-12-25 ENCOUNTER — Encounter: Payer: Self-pay | Admitting: Internal Medicine

## 2010-12-25 DIAGNOSIS — Z0000 Encounter for general adult medical examination without abnormal findings: Secondary | ICD-10-CM | POA: Insufficient documentation

## 2010-12-25 NOTE — Assessment & Plan Note (Signed)
Reviewed EGD report with the patient. Explained the long term risks of silent reflux: Tissue transformation that can be premalignant (Barrett's); strictures.  Plan - he is advised to take omeprazole 40mg  qAM routinely, benefit outweighs risk.

## 2010-12-25 NOTE — Assessment & Plan Note (Signed)
Interval medical history is negative for any new illness or event. Physical exam, sans prostate, is normal. Lab results are in normal range including LDL cholesterol which is better than goal of 130 or less. He is current for colorectal cancer screening. Immunizations: had singles vaccine, due Tdap and pneumonia vaccine.   In summary - a very nice, intertesing man who is medically stable He is encouraged to keep up his healthy ways. He will return as needed.

## 2010-12-25 NOTE — Assessment & Plan Note (Signed)
Patient reports he is doing well. He still follows with Dr. Laverle Patter who monitors his PSA. So far so good.

## 2010-12-25 NOTE — Assessment & Plan Note (Signed)
Doing well, no limitations in activity

## 2011-01-31 DIAGNOSIS — N529 Male erectile dysfunction, unspecified: Secondary | ICD-10-CM | POA: Diagnosis not present

## 2011-01-31 DIAGNOSIS — C61 Malignant neoplasm of prostate: Secondary | ICD-10-CM | POA: Diagnosis not present

## 2011-01-31 DIAGNOSIS — N393 Stress incontinence (female) (male): Secondary | ICD-10-CM | POA: Diagnosis not present

## 2011-03-08 DIAGNOSIS — H2510 Age-related nuclear cataract, unspecified eye: Secondary | ICD-10-CM | POA: Diagnosis not present

## 2011-04-19 DIAGNOSIS — J342 Deviated nasal septum: Secondary | ICD-10-CM | POA: Diagnosis not present

## 2011-04-19 DIAGNOSIS — R040 Epistaxis: Secondary | ICD-10-CM | POA: Diagnosis not present

## 2011-04-28 DIAGNOSIS — C61 Malignant neoplasm of prostate: Secondary | ICD-10-CM | POA: Diagnosis not present

## 2011-05-15 ENCOUNTER — Other Ambulatory Visit: Payer: Self-pay | Admitting: Dermatology

## 2011-05-15 DIAGNOSIS — D485 Neoplasm of uncertain behavior of skin: Secondary | ICD-10-CM | POA: Diagnosis not present

## 2011-05-15 DIAGNOSIS — L259 Unspecified contact dermatitis, unspecified cause: Secondary | ICD-10-CM | POA: Diagnosis not present

## 2011-05-15 DIAGNOSIS — D235 Other benign neoplasm of skin of trunk: Secondary | ICD-10-CM | POA: Diagnosis not present

## 2011-05-15 DIAGNOSIS — D239 Other benign neoplasm of skin, unspecified: Secondary | ICD-10-CM | POA: Diagnosis not present

## 2011-08-21 DIAGNOSIS — H6120 Impacted cerumen, unspecified ear: Secondary | ICD-10-CM | POA: Diagnosis not present

## 2011-09-01 ENCOUNTER — Telehealth: Payer: Self-pay | Admitting: Internal Medicine

## 2011-09-01 DIAGNOSIS — K219 Gastro-esophageal reflux disease without esophagitis: Secondary | ICD-10-CM

## 2011-09-01 NOTE — Telephone Encounter (Signed)
OK for referral - [facility] notified.

## 2011-09-01 NOTE — Telephone Encounter (Signed)
Pt request referral to Dr. Loreta Ave, GI([facility]) for stomach issues, Ph [phone], pt does has appt on 8/21 with Dr Debby Bud, would like referral to see Dr Ferrel Logan ASAP

## 2011-09-04 ENCOUNTER — Telehealth: Payer: Self-pay | Admitting: Internal Medicine

## 2011-09-04 NOTE — Telephone Encounter (Signed)
Pt requesting referral to Dr Charna Elizabeth for GI  Doctor--Also he says when in [place] he do not get diarrhea but in the [place] He gets diarrhea and he is concern about this.  Pt desire a call.

## 2011-09-04 NOTE — Telephone Encounter (Signed)
Called patient - he voices his concerns about the diarrhea issue. His father had stomach cancer, his sister was recently diagnosed with pancreatic cancer.  I told him we were working on the referral to Dr. Loreta Ave

## 2011-09-13 ENCOUNTER — Ambulatory Visit (INDEPENDENT_AMBULATORY_CARE_PROVIDER_SITE_OTHER): Payer: Medicare Other | Admitting: Internal Medicine

## 2011-09-13 ENCOUNTER — Encounter: Payer: Self-pay | Admitting: Internal Medicine

## 2011-09-13 VITALS — BP 138/80 | HR 68 | Temp 98.2°F | Resp 16 | Wt 192.0 lb

## 2011-09-13 DIAGNOSIS — R197 Diarrhea, unspecified: Secondary | ICD-10-CM

## 2011-09-13 NOTE — Progress Notes (Signed)
  Subjective:    Patient ID: Oscar Smith, male    DOB: 1945-02-21, 66 y.o.   MRN: [id number]  HPI Mr. Oscar Smith presetns to get a referral to Dr. Ranae Palms. Reviewed phone notes and referral records. All our work and communication was complete August 15th. He is cleared to accept appointment for August 22nd.    Review of Systems     Objective:   Physical Exam        Assessment & Plan:

## 2011-09-14 DIAGNOSIS — K648 Other hemorrhoids: Secondary | ICD-10-CM | POA: Diagnosis not present

## 2011-09-14 DIAGNOSIS — Z8379 Family history of other diseases of the digestive system: Secondary | ICD-10-CM | POA: Diagnosis not present

## 2011-09-14 DIAGNOSIS — R198 Other specified symptoms and signs involving the digestive system and abdomen: Secondary | ICD-10-CM | POA: Diagnosis not present

## 2011-09-14 DIAGNOSIS — K644 Residual hemorrhoidal skin tags: Secondary | ICD-10-CM | POA: Diagnosis not present

## 2011-09-14 DIAGNOSIS — K219 Gastro-esophageal reflux disease without esophagitis: Secondary | ICD-10-CM | POA: Diagnosis not present

## 2011-09-14 DIAGNOSIS — K5730 Diverticulosis of large intestine without perforation or abscess without bleeding: Secondary | ICD-10-CM | POA: Diagnosis not present

## 2011-10-12 ENCOUNTER — Encounter: Payer: Self-pay | Admitting: Gastroenterology

## 2011-10-12 DIAGNOSIS — Z8379 Family history of other diseases of the digestive system: Secondary | ICD-10-CM | POA: Diagnosis not present

## 2011-10-12 DIAGNOSIS — Z800 Family history of malignant neoplasm of digestive organs: Secondary | ICD-10-CM | POA: Diagnosis not present

## 2011-10-12 DIAGNOSIS — K5730 Diverticulosis of large intestine without perforation or abscess without bleeding: Secondary | ICD-10-CM | POA: Diagnosis not present

## 2011-10-12 DIAGNOSIS — K219 Gastro-esophageal reflux disease without esophagitis: Secondary | ICD-10-CM | POA: Diagnosis not present

## 2011-10-12 DIAGNOSIS — Z86010 Personal history of colonic polyps: Secondary | ICD-10-CM | POA: Diagnosis not present

## 2011-10-12 DIAGNOSIS — Z1211 Encounter for screening for malignant neoplasm of colon: Secondary | ICD-10-CM | POA: Diagnosis not present

## 2011-10-18 ENCOUNTER — Ambulatory Visit (INDEPENDENT_AMBULATORY_CARE_PROVIDER_SITE_OTHER): Payer: Medicare Other | Admitting: Physician Assistant

## 2011-10-18 DIAGNOSIS — Z0000 Encounter for general adult medical examination without abnormal findings: Secondary | ICD-10-CM

## 2011-10-18 DIAGNOSIS — Z23 Encounter for immunization: Secondary | ICD-10-CM | POA: Diagnosis not present

## 2011-11-13 DIAGNOSIS — N529 Male erectile dysfunction, unspecified: Secondary | ICD-10-CM | POA: Diagnosis not present

## 2011-11-13 DIAGNOSIS — N476 Balanoposthitis: Secondary | ICD-10-CM | POA: Diagnosis not present

## 2011-11-13 DIAGNOSIS — N393 Stress incontinence (female) (male): Secondary | ICD-10-CM | POA: Diagnosis not present

## 2011-11-13 DIAGNOSIS — C61 Malignant neoplasm of prostate: Secondary | ICD-10-CM | POA: Diagnosis not present

## 2011-11-15 DIAGNOSIS — D126 Benign neoplasm of colon, unspecified: Secondary | ICD-10-CM | POA: Diagnosis not present

## 2011-11-15 DIAGNOSIS — R198 Other specified symptoms and signs involving the digestive system and abdomen: Secondary | ICD-10-CM | POA: Diagnosis not present

## 2011-11-15 DIAGNOSIS — R197 Diarrhea, unspecified: Secondary | ICD-10-CM | POA: Diagnosis not present

## 2011-11-15 DIAGNOSIS — Z1211 Encounter for screening for malignant neoplasm of colon: Secondary | ICD-10-CM | POA: Diagnosis not present

## 2011-11-15 DIAGNOSIS — K5730 Diverticulosis of large intestine without perforation or abscess without bleeding: Secondary | ICD-10-CM | POA: Diagnosis not present

## 2011-11-22 ENCOUNTER — Ambulatory Visit (INDEPENDENT_AMBULATORY_CARE_PROVIDER_SITE_OTHER): Payer: Medicare Other | Admitting: Family Medicine

## 2011-11-22 VITALS — BP 150/91 | HR 69 | Temp 98.8°F | Resp 16 | Ht 70.0 in | Wt 191.0 lb

## 2011-11-22 DIAGNOSIS — R079 Chest pain, unspecified: Secondary | ICD-10-CM

## 2011-11-22 NOTE — Progress Notes (Signed)
66 yo who developed substernal chest pain last night which resolved with Maalox.  It recurred at noon today.    He had a colonoscopy last week.  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  Objective: NAD Chest:  Clear Heart:  Regular, no murmur Abdomen:  Soft, nontender, some fullness  EKG:  NSR  Assessment:  Most consistent with reflux.  I would like to do a complete physical in the next two weeks and patient will make an appointment.  Plan:  Continue the Maalox prn.  CPE

## 2011-12-04 ENCOUNTER — Ambulatory Visit (INDEPENDENT_AMBULATORY_CARE_PROVIDER_SITE_OTHER): Payer: Medicare Other | Admitting: Family Medicine

## 2011-12-04 ENCOUNTER — Encounter: Payer: Self-pay | Admitting: Family Medicine

## 2011-12-04 VITALS — BP 136/90 | HR 66 | Temp 97.9°F | Resp 16 | Ht 70.0 in | Wt 188.6 lb

## 2011-12-04 DIAGNOSIS — K219 Gastro-esophageal reflux disease without esophagitis: Secondary | ICD-10-CM | POA: Diagnosis not present

## 2011-12-04 DIAGNOSIS — E7800 Pure hypercholesterolemia, unspecified: Secondary | ICD-10-CM | POA: Diagnosis not present

## 2011-12-04 DIAGNOSIS — C61 Malignant neoplasm of prostate: Secondary | ICD-10-CM | POA: Diagnosis not present

## 2011-12-04 DIAGNOSIS — Z13220 Encounter for screening for lipoid disorders: Secondary | ICD-10-CM

## 2011-12-04 DIAGNOSIS — Z0000 Encounter for general adult medical examination without abnormal findings: Secondary | ICD-10-CM | POA: Diagnosis not present

## 2011-12-04 DIAGNOSIS — R030 Elevated blood-pressure reading, without diagnosis of hypertension: Secondary | ICD-10-CM | POA: Diagnosis not present

## 2011-12-04 LAB — POCT URINALYSIS DIPSTICK
Bilirubin, UA: NEGATIVE
Blood, UA: NEGATIVE
Glucose, UA: NEGATIVE
Leukocytes, UA: NEGATIVE
Nitrite, UA: NEGATIVE
Protein, UA: NEGATIVE
Spec Grav, UA: 1.02
Urobilinogen, UA: 0.2
pH, UA: 6.5

## 2011-12-04 LAB — COMPREHENSIVE METABOLIC PANEL
ALT: 24 U/L (ref 0–53)
AST: 21 U/L (ref 0–37)
Albumin: 4.2 g/dL (ref 3.5–5.2)
Alkaline Phosphatase: 76 U/L (ref 39–117)
BUN: 12 mg/dL (ref 6–23)
CO2: 27 mEq/L (ref 19–32)
Calcium: 9.3 mg/dL (ref 8.4–10.5)
Chloride: 101 mEq/L (ref 96–112)
Creat: 0.87 mg/dL (ref 0.50–1.35)
Glucose, Bld: 85 mg/dL (ref 70–99)
Potassium: 4 mEq/L (ref 3.5–5.3)
Sodium: 138 mEq/L (ref 135–145)
Total Bilirubin: 0.9 mg/dL (ref 0.3–1.2)
Total Protein: 7 g/dL (ref 6.0–8.3)

## 2011-12-04 LAB — LIPID PANEL
Cholesterol: 218 mg/dL — ABNORMAL HIGH (ref 0–200)
HDL: 38 mg/dL — ABNORMAL LOW (ref >39)
LDL Cholesterol: 146 mg/dL — ABNORMAL HIGH (ref 0–99)
Total CHOL/HDL Ratio: 5.7 Ratio
Triglycerides: 172 mg/dL — ABNORMAL HIGH (ref <150)
VLDL: 34 mg/dL (ref 0–40)

## 2011-12-04 LAB — IFOBT (OCCULT BLOOD): IFOBT: NEGATIVE

## 2011-12-04 LAB — POCT UA - MICROSCOPIC ONLY
Bacteria, U Microscopic: NEGATIVE
Casts, Ur, LPF, POC: NEGATIVE
Crystals, Ur, HPF, POC: NEGATIVE
Mucus, UA: NEGATIVE
WBC, Ur, HPF, POC: NEGATIVE
Yeast, UA: NEGATIVE

## 2011-12-04 NOTE — Progress Notes (Signed)
@[email or web address] @  Patient ID: Oscar Smith MRN: [id number], DOB: 07/05/1945 66 y.o. Date of Encounter: 12/04/2011, 3:38 PM  Primary Physician: Illene Regulus, MD  Chief Complaint: Physical (CPE)  HPI: 66 y.o. y/o male with history noted below here for CPE.  Doing well. No issues/complaints. Hearing loss:  Since childhood, followed by Dr. 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 Eyes: No visual changes, eye redness, or discharge. ENT/Mouth: Ears: No otalgia, tinnitus, hearing loss, discharge. Nose: No congestion, rhinorrhea, sinus pain, or epistaxis. Throat: No sore throat, post nasal drip, or teeth pain. Cardiovascular: No CP, palpitations, diaphoresis, DOE, edema, orthopnea, PND. Respiratory: No cough, hemoptysis, SOB, or wheezing. Gastrointestinal: No anorexia, dysphagia, reflux, pain, nausea, vomiting, hematemesis, diarrhea, constipation, BRBPR, or melena. Genitourinary: No dysuria, frequency, urgency, hematuria, incontinence, nocturia, decreased urinary stream, discharge, impotence, or testicular pain/masses. Musculoskeletal: No decreased ROM, myalgias, stiffness, joint swelling, or weakness. Skin: No rash, erythema, lesion changes, pain, warmth, jaundice, or pruritis. Neurological: No headache, dizziness, syncope, seizures, tremors, memory loss, coordination problems, or paresthesias. Psychological: No anxiety, depression, hallucinations, SI/HI. Endocrine: No fatigue, polydipsia, polyphagia, polyuria, or known diabetes. All other systems were reviewed and are otherwise negative.  Past Medical History  Diagnosis Date  . Inguinal hernia     right  . Diarrhea   . GERD (gastroesophageal reflux disease)   . Gastritis   .  Adenocarcinoma of prostate     Gleason Grade 6, XRT     Past Surgical History  Procedure Date  . Abdominal hernia repair   . Hernia repair   . Prostate surgery     Home Meds:  Prior to Admission medications   Medication Sig Start Date End Date Taking? Authorizing Provider  MULTIPLE VITAMIN PO Take by mouth.     Yes Historical Provider, MD  omeprazole (PRILOSEC) 40 MG capsule Take 1 capsule (40 mg total) by mouth daily. 12/22/10  Yes Jacques Navy, MD  OVER THE COUNTER MEDICATION OTC Ultraflora taking prn for IB   Yes Historical Provider, MD  fish oil-omega-3 fatty acids 1000 MG capsule Take 2 g by mouth daily.      Historical Provider, MD    Allergies: No Known Allergies  History   Social History  . Marital Status: Married    Spouse Name: N/A    Number of Children: N/A  . Years of Education: N/A   Occupational History  . Not on file.   Social History Main Topics  . Smoking status: Former Smoker    Quit date: 09/04/1965  . Smokeless tobacco: Never Used  . Alcohol Use: 10.5 oz/week    21 drink(s) per week  . Drug Use: No  . Sexually Active: Yes -- Male partner(s)   Other Topics Concern  . Not on file   Social History Narrative   Trained as an Art gallery manager. Married '75- 11 years, divorced; Married '86. No children. 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 Sculptor-life size figures by commission (see work in [employer] [place].) Learning the piano. Patient get regular exercise.    Family History  Problem Relation Age of Onset  . Stomach cancer Father   . Pancreatic cancer Sister   . Cancer Sister     Physical Exam: Blood pressure 136/90, pulse 66, temperature 97.9  F (36.6 C), temperature source Oral, resp. rate 16, height 5\' 10"  (1.778 m), weight 188 lb 9.6 oz (85.548 kg), SpO2 98.00%.  General: Well developed, well nourished, in no acute distress. HEENT: Normocephalic, atraumatic. Conjunctiva pink,  sclera non-icteric. Pupils 2 mm constricting to 1 mm, round, regular, and equally reactive to light and accomodation. EOMI. Internal auditory canal clear. TMs with good cone of light and without pathology. Nasal mucosa pink. Nares are without discharge. No sinus tenderness. Oral mucosa pink. Dentition fair. Pharynx without exudate.   Neck: Supple. Trachea midline. No thyromegaly. Full ROM. No lymphadenopathy. Lungs: Clear to auscultation bilaterally without wheezes, rales, or rhonchi. Breathing is of normal effort and unlabored.  Prominent swelling left sterno clavicular joint Cardiovascular: RRR with S1 S2. No murmurs, rubs, or gallops appreciated. Distal pulses 2+ symmetrically. No carotid or abdominal bruits Abdomen: Soft, non-tender, non-distended with normoactive bowel sounds. No hepatosplenomegaly or masses. No rebound/guarding. No CVA tenderness. Without hernias.  Rectal: No external hemorrhoids or fissures. Rectal vault without masses.  Genitourinary:  uncircumcised male. No penile lesions. Testes descended bilaterally, and smooth without tenderness or masses.  Musculoskeletal: Full range of motion and 5/5 strength throughout. Without swelling, atrophy, tenderness, crepitus, or warmth. Extremities without clubbing, cyanosis, or edema. Calves supple. Skin: Warm and moist without erythema, ecchymosis, wounds, or rash. Neuro: A+Ox3. CN II-XII grossly intact. Moves all extremities spontaneously. Full sensation throughout. Normal gait. DTR 2+ throughout upper and lower extremities. Finger to nose intact. Psych:  Responds to questions appropriately with a normal affect.   Studies: CBC, CMET, Lipid,  UA:   Assessment/Plan:  66 y.o. y/o  male here for CPE - 1. Annual physical exam     Signed, Elvina Sidle, MD 12/04/2011 3:38 PM

## 2011-12-05 LAB — CBC WITH DIFFERENTIAL/PLATELET
Basophils Absolute: 0 10*3/uL (ref 0.0–0.1)
Basophils Relative: 1 % (ref 0–1)
Eosinophils Absolute: 0.1 10*3/uL (ref 0.0–0.7)
Eosinophils Relative: 2 % (ref 0–5)
HCT: 42.8 % (ref 39.0–52.0)
Hemoglobin: 15.2 g/dL (ref 13.0–17.0)
Lymphocytes Relative: 31 % (ref 12–46)
Lymphs Abs: 1.6 10*3/uL (ref 0.7–4.0)
MCH: 31.7 pg (ref 26.0–34.0)
MCHC: 35.5 g/dL (ref 30.0–36.0)
MCV: 89.4 fL (ref 78.0–100.0)
Monocytes Absolute: 0.4 10*3/uL (ref 0.1–1.0)
Monocytes Relative: 8 % (ref 3–12)
Neutro Abs: 3.1 10*3/uL (ref 1.7–7.7)
Neutrophils Relative %: 58 % (ref 43–77)
Platelets: 192 10*3/uL (ref 150–400)
RBC: 4.79 MIL/uL (ref 4.22–5.81)
RDW: 13.5 % (ref 11.5–15.5)
WBC: 5.3 10*3/uL (ref 4.0–10.5)

## 2011-12-08 DIAGNOSIS — R350 Frequency of micturition: Secondary | ICD-10-CM | POA: Diagnosis not present

## 2011-12-12 DIAGNOSIS — J310 Chronic rhinitis: Secondary | ICD-10-CM | POA: Diagnosis not present

## 2011-12-12 DIAGNOSIS — H6120 Impacted cerumen, unspecified ear: Secondary | ICD-10-CM | POA: Diagnosis not present

## 2011-12-12 DIAGNOSIS — H903 Sensorineural hearing loss, bilateral: Secondary | ICD-10-CM | POA: Diagnosis not present

## 2011-12-12 DIAGNOSIS — J342 Deviated nasal septum: Secondary | ICD-10-CM | POA: Diagnosis not present

## 2011-12-25 DIAGNOSIS — N529 Male erectile dysfunction, unspecified: Secondary | ICD-10-CM | POA: Diagnosis not present

## 2012-01-03 DIAGNOSIS — N529 Male erectile dysfunction, unspecified: Secondary | ICD-10-CM | POA: Diagnosis not present

## 2012-01-03 DIAGNOSIS — R350 Frequency of micturition: Secondary | ICD-10-CM | POA: Diagnosis not present

## 2012-01-08 DIAGNOSIS — J328 Other chronic sinusitis: Secondary | ICD-10-CM | POA: Diagnosis not present

## 2012-01-11 ENCOUNTER — Other Ambulatory Visit: Payer: Self-pay | Admitting: Otolaryngology

## 2012-01-11 DIAGNOSIS — J324 Chronic pansinusitis: Secondary | ICD-10-CM

## 2012-01-18 DIAGNOSIS — C61 Malignant neoplasm of prostate: Secondary | ICD-10-CM | POA: Diagnosis not present

## 2012-01-18 DIAGNOSIS — N529 Male erectile dysfunction, unspecified: Secondary | ICD-10-CM | POA: Diagnosis not present

## 2012-01-19 ENCOUNTER — Other Ambulatory Visit: Payer: Medicare Other

## 2012-01-25 ENCOUNTER — Encounter: Payer: Medicare Other | Admitting: Internal Medicine

## 2012-02-06 ENCOUNTER — Ambulatory Visit
Admission: RE | Admit: 2012-02-06 | Discharge: 2012-02-06 | Disposition: A | Discharge status: Home / Self Care | Payer: Medicare Other | Source: Ambulatory Visit | Attending: Otolaryngology | Admitting: Otolaryngology

## 2012-02-06 DIAGNOSIS — J329 Chronic sinusitis, unspecified: Secondary | ICD-10-CM | POA: Diagnosis not present

## 2012-02-06 DIAGNOSIS — J324 Chronic pansinusitis: Secondary | ICD-10-CM

## 2012-02-14 DIAGNOSIS — J328 Other chronic sinusitis: Secondary | ICD-10-CM | POA: Diagnosis not present

## 2012-02-20 DIAGNOSIS — J3489 Other specified disorders of nose and nasal sinuses: Secondary | ICD-10-CM | POA: Diagnosis not present

## 2012-02-20 DIAGNOSIS — J329 Chronic sinusitis, unspecified: Secondary | ICD-10-CM | POA: Diagnosis not present

## 2012-02-20 DIAGNOSIS — J342 Deviated nasal septum: Secondary | ICD-10-CM | POA: Diagnosis not present

## 2012-02-20 DIAGNOSIS — J341 Cyst and mucocele of nose and nasal sinus: Secondary | ICD-10-CM | POA: Insufficient documentation

## 2012-02-20 DIAGNOSIS — B9689 Other specified bacterial agents as the cause of diseases classified elsewhere: Secondary | ICD-10-CM | POA: Insufficient documentation

## 2012-03-13 DIAGNOSIS — H2510 Age-related nuclear cataract, unspecified eye: Secondary | ICD-10-CM | POA: Diagnosis not present

## 2012-04-12 DIAGNOSIS — C61 Malignant neoplasm of prostate: Secondary | ICD-10-CM | POA: Diagnosis not present

## 2012-04-16 DIAGNOSIS — J329 Chronic sinusitis, unspecified: Secondary | ICD-10-CM | POA: Diagnosis not present

## 2012-04-16 DIAGNOSIS — J342 Deviated nasal septum: Secondary | ICD-10-CM | POA: Diagnosis not present

## 2012-04-19 DIAGNOSIS — C61 Malignant neoplasm of prostate: Secondary | ICD-10-CM | POA: Diagnosis not present

## 2012-06-10 ENCOUNTER — Ambulatory Visit (INDEPENDENT_AMBULATORY_CARE_PROVIDER_SITE_OTHER): Payer: Medicare Other | Admitting: Family Medicine

## 2012-06-10 VITALS — BP 158/90 | HR 78 | Temp 98.7°F | Resp 18 | Ht 70.0 in | Wt 188.0 lb

## 2012-06-10 DIAGNOSIS — K5732 Diverticulitis of large intestine without perforation or abscess without bleeding: Secondary | ICD-10-CM

## 2012-06-10 DIAGNOSIS — R1032 Left lower quadrant pain: Secondary | ICD-10-CM

## 2012-06-10 LAB — POCT CBC
Granulocyte percent: 75.1 %G (ref 37–80)
HCT, POC: 43.6 % (ref 43.5–53.7)
Hemoglobin: 14.2 g/dL (ref 14.1–18.1)
Lymph, poc: 1.5 (ref 0.6–3.4)
MCH, POC: 31.7 pg — AB (ref 27–31.2)
MCHC: 32.6 g/dL (ref 31.8–35.4)
MCV: 97.4 fL — AB (ref 80–97)
MID (cbc): 0.6 (ref 0–0.9)
MPV: 9.7 fL (ref 0–99.8)
POC Granulocyte: 6.4 (ref 2–6.9)
POC LYMPH PERCENT: 17.5 %L (ref 10–50)
POC MID %: 7.4 %M (ref 0–12)
Platelet Count, POC: 168 10*3/uL (ref 142–424)
RBC: 4.48 M/uL — AB (ref 4.69–6.13)
RDW, POC: 12.8 %
WBC: 8.5 10*3/uL (ref 4.6–10.2)

## 2012-06-10 LAB — POCT URINALYSIS DIPSTICK
Bilirubin, UA: NEGATIVE
Blood, UA: NEGATIVE
Glucose, UA: NEGATIVE
Ketones, UA: NEGATIVE
Leukocytes, UA: NEGATIVE
Nitrite, UA: NEGATIVE
Protein, UA: NEGATIVE
Spec Grav, UA: 1.015
Urobilinogen, UA: 0.2
pH, UA: 5.5

## 2012-06-10 LAB — POCT UA - MICROSCOPIC ONLY
Bacteria, U Microscopic: NEGATIVE
Casts, Ur, LPF, POC: NEGATIVE
Crystals, Ur, HPF, POC: NEGATIVE
Epithelial cells, urine per micros: NEGATIVE
Mucus, UA: NEGATIVE
RBC, urine, microscopic: NEGATIVE
WBC, Ur, HPF, POC: NEGATIVE
Yeast, UA: NEGATIVE

## 2012-06-10 LAB — POCT SEDIMENTATION RATE: POCT SED RATE: 30 mm/hr — AB (ref 0–22)

## 2012-06-10 MED ORDER — METRONIDAZOLE 500 MG PO TABS: 500.0000 mg | ORAL_TABLET | Freq: Three times a day (TID) | ORAL | Status: DC

## 2012-06-10 MED ORDER — CIPROFLOXACIN HCL 500 MG PO TABS: 500.0000 mg | ORAL_TABLET | Freq: Two times a day (BID) | ORAL | Status: DC

## 2012-06-10 NOTE — Progress Notes (Signed)
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  When he arrived home last Friday, he noted daily diarrhea and periumbilical abdominal pain.  No blood in the diarrhea.  No night sweats or fever.  He has chronic urinary leakage following the surgery for prostate cancer.  Objective:  NAD Alert, appropriate Chest:  Clear Heart:  Reg, no murmur Abdomen:  Soft, tender LLQ with palpation, no masses or HSM, no rebound Results for orders placed in visit on 06/10/12  POCT UA - MICROSCOPIC ONLY      Result Value Range   WBC, Ur, HPF, POC neg     RBC, urine, microscopic neg     Bacteria, U Microscopic neg     Mucus, UA neg     Epithelial cells, urine per micros neg     Crystals, Ur, HPF, POC neg     Casts, Ur, LPF, POC neg     Yeast, UA neg    POCT URINALYSIS DIPSTICK      Result Value Range   Color, UA yellow     Clarity, UA clear     Glucose, UA neg     Bilirubin, UA neg     Ketones, UA neg     Spec Grav, UA 1.015     Blood, UA neg     pH, UA 5.5     Protein, UA neg     Urobilinogen, UA 0.2     Nitrite, UA neg     Leukocytes, UA Negative    POCT CBC      Result Value Range   WBC 8.5  4.6 - 10.2 K/uL   Lymph, poc 1.5  0.6 - 3.4   POC LYMPH PERCENT 17.5  10 - 50 %L   MID (cbc) 0.6  0 - 0.9   POC MID % 7.4  0 - 12 %M   POC Granulocyte 6.4  2 - 6.9   Granulocyte percent 75.1  37 - 80 %G   RBC 4.48 (*) 4.69 - 6.13 M/uL   Hemoglobin 14.2  14.1 - 18.1 g/dL   HCT, POC 95.6  21.3 - 53.7 %   MCV 97.4 (*) 80 - 97 fL   MCH, POC 31.7 (*) 27 - 31.2 pg   MCHC 32.6  31.8 - 35.4 g/dL   RDW, POC 08.6     Platelet Count, POC 168  142 - 424 K/uL   MPV 9.7  0 - 99.8 fL  POCT SEDIMENTATION RATE      Result Value Range   POCT SED RATE    0 - 22 mm/hr      Assessment:  Diverticulitis, acute without evidence of abscess formation.    Plan:  Follow up 48 hours

## 2012-06-10 NOTE — Patient Instructions (Addendum)
Diverticulitis °A diverticulum is a small pouch or sac on the colon. Diverticulosis is the presence of these diverticula on the colon. Diverticulitis is the irritation (inflammation) or infection of diverticula. °CAUSES  °The colon and its diverticula contain bacteria. If food particles block the tiny opening to a diverticulum, the bacteria inside can grow and cause an increase in pressure. This leads to infection and inflammation and is called diverticulitis. °SYMPTOMS  °· Abdominal pain and tenderness. Usually, the pain is located on the left side of your abdomen. However, it could be located elsewhere. °· Fever. °· Bloating. °· Feeling sick to your stomach (nausea). °· Throwing up (vomiting). °· Abnormal stools. °DIAGNOSIS  °Your caregiver will take a history and perform a physical exam. Since many things can cause abdominal pain, other tests may be necessary. Tests may include: °· Blood tests. °· Urine tests. °· X-ray of the abdomen. °· CT scan of the abdomen. °Sometimes, surgery is needed to determine if diverticulitis or other conditions are causing your symptoms. °TREATMENT  °Most of the time, you can be treated without surgery. Treatment includes: °· Resting the bowels by only having liquids for a few days. As you improve, you will need to eat a low-fiber diet. °· Intravenous (IV) fluids if you are losing body fluids (dehydrated). °· Antibiotic medicines that treat infections may be given. °· Pain and nausea medicine, if needed. °· Surgery if the inflamed diverticulum has burst. °HOME CARE INSTRUCTIONS  °· Try a clear liquid diet (broth, tea, or water for as long as directed by your caregiver). You may then gradually begin a low-fiber diet as tolerated. A low-fiber diet is a diet with less than 10 grams of fiber. Choose the foods below to reduce fiber in the diet: °· White breads, cereals, rice, and pasta. °· Cooked fruits and vegetables or soft fresh fruits and vegetables without the skin. °· Ground or  well-cooked tender beef, ham, veal, lamb, pork, or poultry. °· Eggs and seafood. °· After your diverticulitis symptoms have improved, your caregiver may put you on a high-fiber diet. A high-fiber diet includes 14 grams of fiber for every 1000 calories consumed. For a standard 2000 calorie diet, you would need 28 grams of fiber. Follow these diet guidelines to help you increase the fiber in your diet. It is important to slowly increase the amount fiber in your diet to avoid gas, constipation, and bloating. °· Choose whole-grain breads, cereals, pasta, and brown rice. °· Choose fresh fruits and vegetables with the skin on. Do not overcook vegetables because the more vegetables are cooked, the more fiber is lost. °· Choose more nuts, seeds, legumes, dried peas, beans, and lentils. °· Look for food products that have greater than 3 grams of fiber per serving on the Nutrition Facts label. °· Take all medicine as directed by your caregiver. °· If your caregiver has given you a follow-up appointment, it is very important that you go. Not going could result in lasting (chronic) or permanent injury, pain, and disability. If there is any problem keeping the appointment, call to reschedule. °SEEK MEDICAL CARE IF:  °· Your pain does not improve. °· You have a hard time advancing your diet beyond clear liquids. °· Your bowel movements do not return to normal. °SEEK IMMEDIATE MEDICAL CARE IF:  °· Your pain becomes worse. °· You have an oral temperature above 102° F (38.9° C), not controlled by medicine. °· You have repeated vomiting. °· You have bloody or black, tarry stools. °· Symptoms   that brought you to your caregiver become worse or are not getting better. °MAKE SURE YOU:  °· Understand these instructions. °· Will watch your condition. °· Will get help right away if you are not doing well or get worse. °Document Released: 10/19/2004 Document Revised: 04/03/2011 Document Reviewed: 02/14/2010 °ExitCare® Patient Information  ©2013 [employer]. ° °

## 2012-06-18 ENCOUNTER — Ambulatory Visit: Payer: Medicare Other | Admitting: Family Medicine

## 2012-08-20 DIAGNOSIS — J329 Chronic sinusitis, unspecified: Secondary | ICD-10-CM | POA: Diagnosis not present

## 2012-08-20 DIAGNOSIS — J3489 Other specified disorders of nose and nasal sinuses: Secondary | ICD-10-CM | POA: Diagnosis not present

## 2012-08-20 DIAGNOSIS — J342 Deviated nasal septum: Secondary | ICD-10-CM | POA: Diagnosis not present

## 2012-09-05 ENCOUNTER — Ambulatory Visit (INDEPENDENT_AMBULATORY_CARE_PROVIDER_SITE_OTHER): Payer: Medicare Other | Admitting: Family Medicine

## 2012-09-05 ENCOUNTER — Encounter: Payer: Self-pay | Admitting: Family Medicine

## 2012-09-05 VITALS — BP 114/64 | HR 92 | Temp 98.1°F | Resp 16 | Ht 70.5 in | Wt 186.0 lb

## 2012-09-05 DIAGNOSIS — R002 Palpitations: Secondary | ICD-10-CM

## 2012-09-05 DIAGNOSIS — R42 Dizziness and giddiness: Secondary | ICD-10-CM | POA: Diagnosis not present

## 2012-09-05 LAB — CBC WITH DIFFERENTIAL/PLATELET
Basophils Absolute: 0 10*3/uL (ref 0.0–0.1)
Basophils Relative: 0 % (ref 0–1)
Eosinophils Absolute: 0.1 10*3/uL (ref 0.0–0.7)
Eosinophils Relative: 2 % (ref 0–5)
HCT: 44.8 % (ref 39.0–52.0)
Hemoglobin: 15.7 g/dL (ref 13.0–17.0)
Lymphocytes Relative: 23 % (ref 12–46)
Lymphs Abs: 1.4 10*3/uL (ref 0.7–4.0)
MCH: 31.4 pg (ref 26.0–34.0)
MCHC: 35 g/dL (ref 30.0–36.0)
MCV: 89.6 fL (ref 78.0–100.0)
Monocytes Absolute: 0.7 10*3/uL (ref 0.1–1.0)
Monocytes Relative: 10 % (ref 3–12)
Neutro Abs: 4 10*3/uL (ref 1.7–7.7)
Neutrophils Relative %: 65 % (ref 43–77)
Platelets: 211 10*3/uL (ref 150–400)
RBC: 5 MIL/uL (ref 4.22–5.81)
RDW: 13.8 % (ref 11.5–15.5)
WBC: 6.2 10*3/uL (ref 4.0–10.5)

## 2012-09-05 LAB — COMPREHENSIVE METABOLIC PANEL
ALT: 20 U/L (ref 0–53)
AST: 18 U/L (ref 0–37)
Albumin: 4 g/dL (ref 3.5–5.2)
Alkaline Phosphatase: 68 U/L (ref 39–117)
BUN: 15 mg/dL (ref 6–23)
CO2: 28 mEq/L (ref 19–32)
Calcium: 9.2 mg/dL (ref 8.4–10.5)
Chloride: 102 mEq/L (ref 96–112)
Creat: 1.19 mg/dL (ref 0.50–1.35)
Glucose, Bld: 78 mg/dL (ref 70–99)
Potassium: 4.2 mEq/L (ref 3.5–5.3)
Sodium: 138 mEq/L (ref 135–145)
Total Bilirubin: 0.8 mg/dL (ref 0.3–1.2)
Total Protein: 6.6 g/dL (ref 6.0–8.3)

## 2012-09-05 LAB — TSH: TSH: 1.171 u[IU]/mL (ref 0.350–4.500)

## 2012-09-05 NOTE — Progress Notes (Signed)
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 He feels like at times the heart is racing and 90 pounds his chest and it stops. He's had no chest pain or syncope. He also denies shortness of breath.  Patient denies calf pain or edema. Patient does have chronic hearing loss but this has not changed recently and is not having vertigo. He also has no ear pain.  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  Objective: HEENT unremarkable No acute distress Chest: Clear Neck: Supple no adenopathy or bruits Heart: Regular no murmur Abdomen: Soft nontender Extremities: No edema, marked varicosities on the left leg. Skin: No rash  Assessment:  strong possibility the patient is having anxiety related issues. 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 He may be having some intermittent atrial arrhythmias, the patient is rather reluctant to pursue etiologies if they aren't immediately life-threatening and he says he can live with this as long as it's not a coronary issue.  Plan: Hold off on cardiology consult for now Lightheaded - Plan: CBC with Differential, TSH, EKG 12-Lead  Palpitations - Plan: TSH, Comprehensive metabolic panel, EKG 12-Lead  Signed, Elvina Sidle, MD

## 2012-11-01 ENCOUNTER — Ambulatory Visit (INDEPENDENT_AMBULATORY_CARE_PROVIDER_SITE_OTHER): Payer: Medicare Other | Admitting: *Deleted

## 2012-11-01 DIAGNOSIS — Z23 Encounter for immunization: Secondary | ICD-10-CM | POA: Diagnosis not present

## 2012-11-20 ENCOUNTER — Other Ambulatory Visit: Payer: Self-pay | Admitting: Dermatology

## 2012-11-20 DIAGNOSIS — L821 Other seborrheic keratosis: Secondary | ICD-10-CM | POA: Diagnosis not present

## 2012-11-20 DIAGNOSIS — L723 Sebaceous cyst: Secondary | ICD-10-CM | POA: Diagnosis not present

## 2012-11-20 DIAGNOSIS — D485 Neoplasm of uncertain behavior of skin: Secondary | ICD-10-CM | POA: Diagnosis not present

## 2012-11-20 DIAGNOSIS — D1801 Hemangioma of skin and subcutaneous tissue: Secondary | ICD-10-CM | POA: Diagnosis not present

## 2012-11-25 DIAGNOSIS — C61 Malignant neoplasm of prostate: Secondary | ICD-10-CM | POA: Diagnosis not present

## 2012-12-02 DIAGNOSIS — R350 Frequency of micturition: Secondary | ICD-10-CM | POA: Diagnosis not present

## 2012-12-02 DIAGNOSIS — C61 Malignant neoplasm of prostate: Secondary | ICD-10-CM | POA: Diagnosis not present

## 2012-12-03 ENCOUNTER — Other Ambulatory Visit: Payer: Self-pay | Admitting: Dermatology

## 2012-12-03 DIAGNOSIS — D1801 Hemangioma of skin and subcutaneous tissue: Secondary | ICD-10-CM | POA: Diagnosis not present

## 2012-12-03 DIAGNOSIS — D485 Neoplasm of uncertain behavior of skin: Secondary | ICD-10-CM | POA: Diagnosis not present

## 2012-12-05 ENCOUNTER — Other Ambulatory Visit: Payer: Self-pay | Admitting: Family Medicine

## 2012-12-05 DIAGNOSIS — I83893 Varicose veins of bilateral lower extremities with other complications: Secondary | ICD-10-CM

## 2012-12-10 DIAGNOSIS — I8310 Varicose veins of unspecified lower extremity with inflammation: Secondary | ICD-10-CM | POA: Diagnosis not present

## 2012-12-17 DIAGNOSIS — L820 Inflamed seborrheic keratosis: Secondary | ICD-10-CM | POA: Diagnosis not present

## 2012-12-24 DIAGNOSIS — M79609 Pain in unspecified limb: Secondary | ICD-10-CM | POA: Diagnosis not present

## 2012-12-24 DIAGNOSIS — I8310 Varicose veins of unspecified lower extremity with inflammation: Secondary | ICD-10-CM | POA: Diagnosis not present

## 2012-12-26 DIAGNOSIS — M79609 Pain in unspecified limb: Secondary | ICD-10-CM | POA: Diagnosis not present

## 2012-12-26 DIAGNOSIS — I8310 Varicose veins of unspecified lower extremity with inflammation: Secondary | ICD-10-CM | POA: Diagnosis not present

## 2013-01-30 ENCOUNTER — Encounter: Payer: Self-pay | Admitting: Family Medicine

## 2013-01-30 ENCOUNTER — Ambulatory Visit (INDEPENDENT_AMBULATORY_CARE_PROVIDER_SITE_OTHER): Payer: Medicare Other | Admitting: Family Medicine

## 2013-01-30 DIAGNOSIS — N529 Male erectile dysfunction, unspecified: Secondary | ICD-10-CM

## 2013-01-30 DIAGNOSIS — Z23 Encounter for immunization: Secondary | ICD-10-CM

## 2013-01-30 MED ORDER — TETANUS-DIPHTH-ACELL PERTUSSIS 5-2.5-18.5 LF-MCG/0.5 IM SUSP: 0.5000 mL | Freq: Once | INTRAMUSCULAR | Status: DC

## 2013-01-30 MED ORDER — SILDENAFIL CITRATE 100 MG PO TABS: 50.0000 mg | ORAL_TABLET | Freq: Every day | ORAL | Status: DC | PRN

## 2013-01-30 NOTE — Progress Notes (Signed)
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 He'll be traveling to [place], [place] to the [place] in February. He wants to make sure all of his immunizations are up-to-date.  Patient's last tetanus shot was June of 2004.  Patient also be traveling to the [place] with stump oversewn [place]. He's concerned about having read about a new virus there and wonders if there's a vaccine for that.  Patient continues to have some erectile dysfunction. He would like to try the vacuum pump. He's had some success with Viagra but not a lot. He's not on the best in terms with his wife at this point.  Plan:  Travel, initial encounter - Plan: Tdap (BOOSTRIX) injection 0.5 mL  Erectile dysfunction - Plan: sildenafil (VIAGRA) 100 MG tablet Vacuum pump prescription written for patient  Signed, [name], MD

## 2013-02-04 DIAGNOSIS — K5730 Diverticulosis of large intestine without perforation or abscess without bleeding: Secondary | ICD-10-CM | POA: Diagnosis not present

## 2013-02-04 DIAGNOSIS — K219 Gastro-esophageal reflux disease without esophagitis: Secondary | ICD-10-CM | POA: Diagnosis not present

## 2013-02-04 DIAGNOSIS — K449 Diaphragmatic hernia without obstruction or gangrene: Secondary | ICD-10-CM | POA: Diagnosis not present

## 2013-02-13 ENCOUNTER — Encounter: Payer: Self-pay | Admitting: Family Medicine

## 2013-02-13 ENCOUNTER — Ambulatory Visit (INDEPENDENT_AMBULATORY_CARE_PROVIDER_SITE_OTHER): Payer: Medicare Other | Admitting: Family Medicine

## 2013-02-13 VITALS — BP 144/87 | HR 67 | Temp 97.7°F | Resp 16 | Ht 69.0 in | Wt 183.2 lb

## 2013-02-13 DIAGNOSIS — Z1329 Encounter for screening for other suspected endocrine disorder: Secondary | ICD-10-CM

## 2013-02-13 DIAGNOSIS — Z130 Encounter for screening for diseases of the blood and blood-forming organs and certain disorders involving the immune mechanism: Secondary | ICD-10-CM

## 2013-02-13 DIAGNOSIS — Z13228 Encounter for screening for other metabolic disorders: Secondary | ICD-10-CM

## 2013-02-13 DIAGNOSIS — Z23 Encounter for immunization: Secondary | ICD-10-CM

## 2013-02-13 NOTE — Progress Notes (Signed)
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 He's not sure if he had the measles as a child. He's doing some international traveling in the next month, stricture is completely immune.  Plan: MMR today.  Signed, Ozella Rocks.D.

## 2013-02-19 DIAGNOSIS — N476 Balanoposthitis: Secondary | ICD-10-CM | POA: Diagnosis not present

## 2013-02-20 DIAGNOSIS — J342 Deviated nasal septum: Secondary | ICD-10-CM | POA: Diagnosis not present

## 2013-02-20 DIAGNOSIS — J3489 Other specified disorders of nose and nasal sinuses: Secondary | ICD-10-CM | POA: Diagnosis not present

## 2013-02-20 DIAGNOSIS — J329 Chronic sinusitis, unspecified: Secondary | ICD-10-CM | POA: Diagnosis not present

## 2013-03-13 DIAGNOSIS — H2510 Age-related nuclear cataract, unspecified eye: Secondary | ICD-10-CM | POA: Diagnosis not present

## 2013-03-17 DIAGNOSIS — H1044 Vernal conjunctivitis: Secondary | ICD-10-CM | POA: Diagnosis not present

## 2013-04-22 DIAGNOSIS — I8310 Varicose veins of unspecified lower extremity with inflammation: Secondary | ICD-10-CM | POA: Diagnosis not present

## 2013-04-28 ENCOUNTER — Telehealth: Payer: Self-pay | Admitting: *Deleted

## 2013-04-28 NOTE — Telephone Encounter (Signed)
On 04-28-13 fax medical records to [facility] [place] [facility], it was follow up note,new pt consult note, end of tx note,

## 2013-05-29 DIAGNOSIS — C61 Malignant neoplasm of prostate: Secondary | ICD-10-CM | POA: Diagnosis not present

## 2013-06-05 DIAGNOSIS — Z8546 Personal history of malignant neoplasm of prostate: Secondary | ICD-10-CM | POA: Diagnosis not present

## 2013-06-05 DIAGNOSIS — R39198 Other difficulties with micturition: Secondary | ICD-10-CM | POA: Diagnosis not present

## 2013-06-05 DIAGNOSIS — N476 Balanoposthitis: Secondary | ICD-10-CM | POA: Diagnosis not present

## 2013-06-05 DIAGNOSIS — N3943 Post-void dribbling: Secondary | ICD-10-CM | POA: Diagnosis not present

## 2013-07-07 DIAGNOSIS — R39198 Other difficulties with micturition: Secondary | ICD-10-CM | POA: Diagnosis not present

## 2013-07-07 DIAGNOSIS — R350 Frequency of micturition: Secondary | ICD-10-CM | POA: Diagnosis not present

## 2013-07-07 DIAGNOSIS — N476 Balanoposthitis: Secondary | ICD-10-CM | POA: Diagnosis not present

## 2013-07-07 DIAGNOSIS — N3943 Post-void dribbling: Secondary | ICD-10-CM | POA: Diagnosis not present

## 2013-07-11 ENCOUNTER — Encounter: Payer: Self-pay | Admitting: Family Medicine

## 2013-07-11 DIAGNOSIS — C61 Malignant neoplasm of prostate: Secondary | ICD-10-CM

## 2013-07-11 NOTE — Assessment & Plan Note (Signed)
[facility] Dr. Sarajane Marek 06/05/13 Discussion Summary: History of prostate cancer stable  Follow up in six months He is interested in trying medication for his nocturia and weak urinary stream as well as post void dribbling.  Prescription for Flomax sent electronically to his pharmacy.  He is given warnings. Follow up in 1 month with AUA sysmptom score and postvoid residual by ultrasound.   He would like to try topical antifungal cream for his balanitis.  Prescription for clotrimazole/betamethasone sent electronically.  Follow up in one month for reeevaluation.

## 2013-07-22 DIAGNOSIS — R339 Retention of urine, unspecified: Secondary | ICD-10-CM | POA: Diagnosis not present

## 2013-07-22 DIAGNOSIS — R39198 Other difficulties with micturition: Secondary | ICD-10-CM | POA: Diagnosis not present

## 2013-07-22 DIAGNOSIS — R350 Frequency of micturition: Secondary | ICD-10-CM | POA: Diagnosis not present

## 2013-07-22 DIAGNOSIS — N3943 Post-void dribbling: Secondary | ICD-10-CM | POA: Diagnosis not present

## 2013-07-30 DIAGNOSIS — M6281 Muscle weakness (generalized): Secondary | ICD-10-CM | POA: Diagnosis not present

## 2013-07-30 DIAGNOSIS — R279 Unspecified lack of coordination: Secondary | ICD-10-CM | POA: Diagnosis not present

## 2013-07-30 DIAGNOSIS — M62838 Other muscle spasm: Secondary | ICD-10-CM | POA: Diagnosis not present

## 2013-07-30 DIAGNOSIS — N3946 Mixed incontinence: Secondary | ICD-10-CM | POA: Diagnosis not present

## 2013-08-11 DIAGNOSIS — M6281 Muscle weakness (generalized): Secondary | ICD-10-CM | POA: Diagnosis not present

## 2013-08-11 DIAGNOSIS — N3946 Mixed incontinence: Secondary | ICD-10-CM | POA: Diagnosis not present

## 2013-08-11 DIAGNOSIS — R279 Unspecified lack of coordination: Secondary | ICD-10-CM | POA: Diagnosis not present

## 2013-08-11 DIAGNOSIS — M62838 Other muscle spasm: Secondary | ICD-10-CM | POA: Diagnosis not present

## 2013-08-21 DIAGNOSIS — M6281 Muscle weakness (generalized): Secondary | ICD-10-CM | POA: Diagnosis not present

## 2013-08-21 DIAGNOSIS — M62838 Other muscle spasm: Secondary | ICD-10-CM | POA: Diagnosis not present

## 2013-08-21 DIAGNOSIS — N3946 Mixed incontinence: Secondary | ICD-10-CM | POA: Diagnosis not present

## 2013-08-21 DIAGNOSIS — R279 Unspecified lack of coordination: Secondary | ICD-10-CM | POA: Diagnosis not present

## 2013-08-27 ENCOUNTER — Encounter: Payer: Self-pay | Admitting: *Deleted

## 2013-08-27 DIAGNOSIS — R339 Retention of urine, unspecified: Secondary | ICD-10-CM | POA: Insufficient documentation

## 2013-09-23 DIAGNOSIS — M6281 Muscle weakness (generalized): Secondary | ICD-10-CM | POA: Diagnosis not present

## 2013-09-23 DIAGNOSIS — R279 Unspecified lack of coordination: Secondary | ICD-10-CM | POA: Diagnosis not present

## 2013-09-23 DIAGNOSIS — N3946 Mixed incontinence: Secondary | ICD-10-CM | POA: Diagnosis not present

## 2013-09-25 ENCOUNTER — Ambulatory Visit (INDEPENDENT_AMBULATORY_CARE_PROVIDER_SITE_OTHER): Payer: Medicare Other

## 2013-09-25 DIAGNOSIS — Z23 Encounter for immunization: Secondary | ICD-10-CM | POA: Diagnosis not present

## 2013-10-02 DIAGNOSIS — N3946 Mixed incontinence: Secondary | ICD-10-CM | POA: Diagnosis not present

## 2013-10-02 DIAGNOSIS — M62838 Other muscle spasm: Secondary | ICD-10-CM | POA: Diagnosis not present

## 2013-10-02 DIAGNOSIS — M6281 Muscle weakness (generalized): Secondary | ICD-10-CM | POA: Diagnosis not present

## 2013-10-02 DIAGNOSIS — R279 Unspecified lack of coordination: Secondary | ICD-10-CM | POA: Diagnosis not present

## 2013-11-03 ENCOUNTER — Ambulatory Visit (INDEPENDENT_AMBULATORY_CARE_PROVIDER_SITE_OTHER): Payer: Medicare Other

## 2013-11-03 ENCOUNTER — Ambulatory Visit (INDEPENDENT_AMBULATORY_CARE_PROVIDER_SITE_OTHER): Payer: Medicare Other | Admitting: Podiatry

## 2013-11-03 ENCOUNTER — Encounter: Payer: Self-pay | Admitting: Podiatry

## 2013-11-03 VITALS — BP 103/73 | HR 85 | Resp 14 | Ht 70.0 in | Wt 183.0 lb

## 2013-11-03 DIAGNOSIS — M722 Plantar fascial fibromatosis: Secondary | ICD-10-CM

## 2013-11-03 DIAGNOSIS — M79672 Pain in left foot: Secondary | ICD-10-CM

## 2013-11-03 NOTE — Patient Instructions (Signed)
Bent - Knee Calf Stretch  1) Stand an arm's length away from a wall. Place the palms of your hands on the wall. Step forward about 12 inches with the opposite foot.  2) Keeping toes pointed forward and both heels on the floor, bend both knees and lean forward. Hold this position for 60 seconds. Don't arch your back and don't hunch your shoulders.  3) Repeat this twice.  DO THIS STRETCHING TECHNIQUE 3 TIMES A DAY.   Stretching Exercises before Standing      Pull your toes up toward your nose and hold for 1 minute before standing.  A towel can assist with this exercise if you put the towel under the ball of your foot. This exercise reduces the intense    pain associated when changing from a seated to a standing position. This stretch can usually be the most beneficial if done before getting out of bed in the mornings. Plantar Fasciitis Plantar fasciitis is a common condition that causes foot pain. It is soreness (inflammation) of the band of tough fibrous tissue on the bottom of the foot that runs from the heel bone (calcaneus) to the ball of the foot. The cause of this soreness may be from excessive standing, poor fitting shoes, running on hard surfaces, being overweight, having an abnormal walk, or overuse (this is common in runners) of the painful foot or feet. It is also common in aerobic exercise dancers and ballet dancers. SYMPTOMS  Most people with plantar fasciitis complain of:  Severe pain in the morning on the bottom of their foot especially when taking the first steps out of bed. This pain recedes after a few minutes of walking.  Severe pain is experienced also during walking following a long period of inactivity.  Pain is worse when walking barefoot or up stairs DIAGNOSIS   Your caregiver will diagnose this condition by examining and feeling your foot.  Special tests such as X-rays of your foot, are usually not needed. PREVENTION   Consult a sports medicine professional  before beginning a new exercise program.  Walking programs offer a good workout. With walking there is a lower chance of overuse injuries common to runners. There is less impact and less jarring of the joints.  Begin all new exercise programs slowly. If problems or pain develop, decrease the amount of time or distance until you are at a comfortable level.  Wear good shoes and replace them regularly.  Stretch your foot and the heel cords at the back of the ankle (Achilles tendon) both before and after exercise.  Run or exercise on even surfaces that are not hard. For example, asphalt is better than pavement.  Do not run barefoot on hard surfaces.  If using a treadmill, vary the incline.  Do not continue to workout if you have foot or joint problems. Seek professional help if they do not improve. HOME CARE INSTRUCTIONS   Avoid activities that cause you pain until you recover.  Use ice or cold packs on the problem or painful areas after working out.  Only take over-the-counter or prescription medicines for pain, discomfort, or fever as directed by your caregiver.  Soft shoe inserts or athletic shoes with air or gel sole cushions may be helpful.  If problems continue or become more severe, consult a sports medicine caregiver or your own health care provider. Cortisone is a potent anti-inflammatory medication that may be injected into the painful area. You can discuss this treatment with your caregiver. MAKE   SURE YOU:   Understand these instructions.  Will watch your condition.  Will get help right away if you are not doing well or get worse. Document Released: 10/04/2000 Document Revised: 04/03/2011 Document Reviewed: 12/04/2007 ExitCare Patient Information 2015 [employer]. This information is not intended to replace advice given to you by your health care provider. Make sure you discuss any questions you have with your health care provider.  

## 2013-11-03 NOTE — Progress Notes (Signed)
   Subjective:    Patient ID: Oscar Smith, male    DOB: 28-Jul-1945, 68 y.o.   MRN: [id number]  HPI Comments: N plantar fasciitis L left heel pad D 2 weeks or more O [place] trip and a long walk C stone bruise sensation A forgot his orthotics, worse after resting T put orthotics back into the shoes  Pt states his right 1st toe in tender to push down, noticed recently.      Review of Systems  All other systems reviewed and are negative.      Objective:   Physical Exam        Assessment & Plan:

## 2013-11-03 NOTE — Progress Notes (Signed)
   Subjective:    Patient ID: Oscar Smith, male    DOB: 1945/11/07, 68 y.o.   MRN: [id number]  HPI  This patient presents complaining of two-week history of a painful left heel [place] The symptoms have persisted with rest and insertion over-the-counter foot orthotic he denies any direct injury.  Review of Systems  All other systems reviewed and are negative.      Objective:   Physical Exam Orientated x3  Vascular:  DP and PT pulses 2/4 bilaterally  Neurological: Knee &ankle reflex equal and reactive bilaterally  Dermatological: Texture and turgor within normal limits  Musculoskeletal: No deformities noted bilaterally Palpable tenderness fascial band mid arch, proximal one third and insertional area on the left without any palpable lesions  X-ray examination left foot  Intact bony structure without a fracture and/or dislocation noted  Inferior calcaneal spur  No deformities noted  Radiographic impression:  No acute bony abnormality noted in the left foot       Assessment & Plan:    Assessment: Satisfactory neurovascular status Plantar fasciitis left  Plan: Offered patient Kenalog injection he verbally consents  Skin is prepped with alcohol and Betadine and 10 mg of Kenalog mixed with 10 mg of plain Xylocaine and 2.5 mg of plain Marcaine injected inferior heel left for Kenalog injection #1. Patient tolerated injections without any difficulty  Fasciitis trap dispensed to wear on left foot  Shoeing and stretching discussed  Reappoint at patient's request

## 2013-11-04 ENCOUNTER — Encounter: Payer: Self-pay | Admitting: Podiatry

## 2013-11-04 DIAGNOSIS — M79672 Pain in left foot: Secondary | ICD-10-CM | POA: Diagnosis not present

## 2013-11-04 DIAGNOSIS — M722 Plantar fascial fibromatosis: Secondary | ICD-10-CM

## 2013-11-04 MED ORDER — TRIAMCINOLONE ACETONIDE 10 MG/ML IJ SUSP
10.0000 mg | Freq: Once | INTRAMUSCULAR | Status: AC
  Administered 2013-11-04: 10 mg

## 2013-12-01 DIAGNOSIS — Z8546 Personal history of malignant neoplasm of prostate: Secondary | ICD-10-CM | POA: Diagnosis not present

## 2013-12-05 ENCOUNTER — Other Ambulatory Visit: Payer: Self-pay | Admitting: Dermatology

## 2013-12-05 DIAGNOSIS — L821 Other seborrheic keratosis: Secondary | ICD-10-CM | POA: Diagnosis not present

## 2013-12-05 DIAGNOSIS — D1801 Hemangioma of skin and subcutaneous tissue: Secondary | ICD-10-CM | POA: Diagnosis not present

## 2013-12-05 DIAGNOSIS — D485 Neoplasm of uncertain behavior of skin: Secondary | ICD-10-CM | POA: Diagnosis not present

## 2013-12-05 DIAGNOSIS — D225 Melanocytic nevi of trunk: Secondary | ICD-10-CM | POA: Diagnosis not present

## 2013-12-05 DIAGNOSIS — L738 Other specified follicular disorders: Secondary | ICD-10-CM | POA: Diagnosis not present

## 2013-12-05 DIAGNOSIS — D2371 Other benign neoplasm of skin of right lower limb, including hip: Secondary | ICD-10-CM | POA: Diagnosis not present

## 2013-12-08 DIAGNOSIS — C61 Malignant neoplasm of prostate: Secondary | ICD-10-CM | POA: Diagnosis not present

## 2013-12-08 DIAGNOSIS — N5201 Erectile dysfunction due to arterial insufficiency: Secondary | ICD-10-CM | POA: Diagnosis not present

## 2013-12-08 DIAGNOSIS — N401 Enlarged prostate with lower urinary tract symptoms: Secondary | ICD-10-CM | POA: Diagnosis not present

## 2013-12-08 DIAGNOSIS — R350 Frequency of micturition: Secondary | ICD-10-CM | POA: Diagnosis not present

## 2013-12-15 ENCOUNTER — Encounter: Payer: Self-pay | Admitting: *Deleted

## 2013-12-25 ENCOUNTER — Encounter: Payer: Self-pay | Admitting: *Deleted

## 2013-12-25 DIAGNOSIS — N401 Enlarged prostate with lower urinary tract symptoms: Secondary | ICD-10-CM | POA: Insufficient documentation

## 2013-12-25 DIAGNOSIS — N529 Male erectile dysfunction, unspecified: Secondary | ICD-10-CM | POA: Insufficient documentation

## 2013-12-25 DIAGNOSIS — R338 Other retention of urine: Principal | ICD-10-CM

## 2014-01-08 ENCOUNTER — Encounter: Payer: Self-pay | Admitting: Family Medicine

## 2014-01-08 ENCOUNTER — Ambulatory Visit (INDEPENDENT_AMBULATORY_CARE_PROVIDER_SITE_OTHER): Payer: Medicare Other | Admitting: Family Medicine

## 2014-01-08 VITALS — BP 151/95 | HR 82 | Temp 98.3°F | Resp 16 | Ht 70.5 in | Wt 189.0 lb

## 2014-01-08 DIAGNOSIS — K5732 Diverticulitis of large intestine without perforation or abscess without bleeding: Secondary | ICD-10-CM

## 2014-01-08 MED ORDER — AMOXICILLIN-POT CLAVULANATE 875-125 MG PO TABS: 1.0000 | ORAL_TABLET | Freq: Two times a day (BID) | ORAL | Status: DC

## 2014-01-08 NOTE — Progress Notes (Signed)
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  Has h/o diverticulitis.  Worried about pancreatic cancer since friends and family member died of this  Last colonoscopy 2 years ago (Dr. Collene Mares)  Objective: NAD Abdomen:  No mass.  Tender LLQ with deep palp.  No HSM. Some guarding, no rebound Chest: clear Gait: normal Skin: no rash  Assessment:       ICD-9-CM ICD-10-CM   1. Diverticulitis of colon 562.11 K57.32 amoxicillin-clavulanate (AUGMENTIN) 875-125 MG per tablet     Signed, [name], MD

## 2014-02-28 ENCOUNTER — Other Ambulatory Visit: Payer: Self-pay | Admitting: Family Medicine

## 2014-02-28 DIAGNOSIS — S60019A Contusion of unspecified thumb without damage to nail, initial encounter: Secondary | ICD-10-CM

## 2014-03-05 DIAGNOSIS — H2513 Age-related nuclear cataract, bilateral: Secondary | ICD-10-CM | POA: Diagnosis not present

## 2014-03-17 DIAGNOSIS — R2231 Localized swelling, mass and lump, right upper limb: Secondary | ICD-10-CM | POA: Diagnosis not present

## 2014-03-31 DIAGNOSIS — K5730 Diverticulosis of large intestine without perforation or abscess without bleeding: Secondary | ICD-10-CM | POA: Diagnosis not present

## 2014-03-31 DIAGNOSIS — K219 Gastro-esophageal reflux disease without esophagitis: Secondary | ICD-10-CM | POA: Diagnosis not present

## 2014-04-08 ENCOUNTER — Other Ambulatory Visit: Payer: Self-pay | Admitting: Orthopedic Surgery

## 2014-04-08 DIAGNOSIS — R2231 Localized swelling, mass and lump, right upper limb: Secondary | ICD-10-CM | POA: Diagnosis not present

## 2014-04-08 DIAGNOSIS — L720 Epidermal cyst: Secondary | ICD-10-CM | POA: Diagnosis not present

## 2014-05-14 DIAGNOSIS — Z4789 Encounter for other orthopedic aftercare: Secondary | ICD-10-CM | POA: Diagnosis not present

## 2014-05-28 ENCOUNTER — Ambulatory Visit (INDEPENDENT_AMBULATORY_CARE_PROVIDER_SITE_OTHER): Payer: Medicare Other | Admitting: Family Medicine

## 2014-05-28 ENCOUNTER — Encounter: Payer: Self-pay | Admitting: Family Medicine

## 2014-05-28 VITALS — BP 130/70 | HR 72 | Temp 98.0°F | Resp 16 | Ht 70.0 in | Wt 186.2 lb

## 2014-05-28 DIAGNOSIS — Z125 Encounter for screening for malignant neoplasm of prostate: Secondary | ICD-10-CM | POA: Diagnosis not present

## 2014-05-28 DIAGNOSIS — Z0000 Encounter for general adult medical examination without abnormal findings: Secondary | ICD-10-CM

## 2014-05-28 DIAGNOSIS — N529 Male erectile dysfunction, unspecified: Secondary | ICD-10-CM

## 2014-05-28 DIAGNOSIS — H6123 Impacted cerumen, bilateral: Secondary | ICD-10-CM | POA: Diagnosis not present

## 2014-05-28 DIAGNOSIS — E785 Hyperlipidemia, unspecified: Secondary | ICD-10-CM

## 2014-05-28 DIAGNOSIS — Z8042 Family history of malignant neoplasm of prostate: Secondary | ICD-10-CM

## 2014-05-28 DIAGNOSIS — R002 Palpitations: Secondary | ICD-10-CM

## 2014-05-28 DIAGNOSIS — N400 Enlarged prostate without lower urinary tract symptoms: Secondary | ICD-10-CM

## 2014-05-28 LAB — POCT URINALYSIS DIPSTICK
Bilirubin, UA: NEGATIVE
Blood, UA: NEGATIVE
Glucose, UA: NEGATIVE
Ketones, UA: NEGATIVE
Leukocytes, UA: NEGATIVE
Nitrite, UA: NEGATIVE
Protein, UA: NEGATIVE
Spec Grav, UA: 1.01
Urobilinogen, UA: 0.2
pH, UA: 7

## 2014-05-28 LAB — COMPLETE METABOLIC PANEL WITH GFR
ALT: 20 U/L (ref 0–53)
AST: 19 U/L (ref 0–37)
Albumin: 4 g/dL (ref 3.5–5.2)
Alkaline Phosphatase: 75 U/L (ref 39–117)
BUN: 11 mg/dL (ref 6–23)
CO2: 25 mEq/L (ref 19–32)
Calcium: 9 mg/dL (ref 8.4–10.5)
Chloride: 101 mEq/L (ref 96–112)
Creat: 0.98 mg/dL (ref 0.50–1.35)
GFR, Est African American: >89 mL/min
GFR, Est Non African American: 78 mL/min
Glucose, Bld: 90 mg/dL (ref 70–99)
Potassium: 4.6 mEq/L (ref 3.5–5.3)
Sodium: 137 mEq/L (ref 135–145)
Total Bilirubin: 0.7 mg/dL (ref 0.2–1.2)
Total Protein: 6.9 g/dL (ref 6.0–8.3)

## 2014-05-28 LAB — LIPID PANEL
Cholesterol: 223 mg/dL — ABNORMAL HIGH (ref 0–200)
HDL: 33 mg/dL — ABNORMAL LOW (ref >=40)
LDL Cholesterol: 140 mg/dL — ABNORMAL HIGH (ref 0–99)
Total CHOL/HDL Ratio: 6.8 Ratio
Triglycerides: 249 mg/dL — ABNORMAL HIGH (ref <150)
VLDL: 50 mg/dL — ABNORMAL HIGH (ref 0–40)

## 2014-05-28 LAB — CBC
HCT: 43.3 % (ref 39.0–52.0)
Hemoglobin: 15 g/dL (ref 13.0–17.0)
MCH: 31.8 pg (ref 26.0–34.0)
MCHC: 34.6 g/dL (ref 30.0–36.0)
MCV: 91.9 fL (ref 78.0–100.0)
MPV: 10 fL (ref 8.6–12.4)
Platelets: 203 10*3/uL (ref 150–400)
RBC: 4.71 MIL/uL (ref 4.22–5.81)
RDW: 13.4 % (ref 11.5–15.5)
WBC: 5.7 10*3/uL (ref 4.0–10.5)

## 2014-05-28 LAB — IFOBT (OCCULT BLOOD): IFOBT: NEGATIVE

## 2014-05-28 MED ORDER — PNEUMOCOCCAL 13-VAL CONJ VACC IM SUSP: 0.5000 mL | INTRAMUSCULAR | Status: DC

## 2014-05-28 MED ORDER — SILDENAFIL CITRATE 100 MG PO TABS: 50.0000 mg | ORAL_TABLET | Freq: Every day | ORAL | Status: DC | PRN

## 2014-05-28 NOTE — Patient Instructions (Addendum)
Health Maintenance A healthy lifestyle and preventative care can promote health and wellness.  Maintain regular health, dental, and eye exams.  Eat a healthy diet. Foods like vegetables, fruits, whole grains, low-fat dairy products, and lean protein foods contain the nutrients you need and are low in calories. Decrease your intake of foods high in solid fats, added sugars, and salt. Get information about a proper diet from your health care provider, if necessary.  Regular physical exercise is one of the most important things you can do for your health. Most adults should get at least 150 minutes of moderate-intensity exercise (any activity that increases your heart rate and causes you to sweat) each week. In addition, most adults need muscle-strengthening exercises on 2 or more days a week.   Maintain a healthy weight. The body mass index (BMI) is a screening tool to identify possible weight problems. It provides an estimate of body fat based on height and weight. Your health care provider can find your BMI and can help you achieve or maintain a healthy weight. For males 20 years and older:  A BMI below 18.5 is considered underweight.  A BMI of 18.5 to 24.9 is normal.  A BMI of 25 to 29.9 is considered overweight.  A BMI of 30 and above is considered obese.  Maintain normal blood lipids and cholesterol by exercising and minimizing your intake of saturated fat. Eat a balanced diet with plenty of fruits and vegetables. Blood tests for lipids and cholesterol should begin at age 20 and be repeated every 5 years. If your lipid or cholesterol levels are high, you are over age 50, or you are at high risk for heart disease, you may need your cholesterol levels checked more frequently.Ongoing high lipid and cholesterol levels should be treated with medicines if diet and exercise are not working.  If you smoke, find out from your health care provider how to quit. If you do not use tobacco, do not  start.  Lung cancer screening is recommended for adults aged 55-80 years who are at high risk for developing lung cancer because of a history of smoking. A yearly low-dose CT scan of the lungs is recommended for people who have at least a 30-pack-year history of smoking and are current smokers or have quit within the past 15 years. A pack year of smoking is smoking an average of 1 pack of cigarettes a day for 1 year (for example, a 30-pack-year history of smoking could mean smoking 1 pack a day for 30 years or 2 packs a day for 15 years). Yearly screening should continue until the smoker has stopped smoking for at least 15 years. Yearly screening should be stopped for people who develop a health problem that would prevent them from having lung cancer treatment.  If you choose to drink alcohol, do not have more than 2 drinks per day. One drink is considered to be 12 oz (360 mL) of beer, 5 oz (150 mL) of wine, or 1.5 oz (45 mL) of liquor.  Avoid the use of street drugs. Do not share needles with anyone. Ask for help if you need support or instructions about stopping the use of drugs.  High blood pressure causes heart disease and increases the risk of stroke. Blood pressure should be checked at least every 1-2 years. Ongoing high blood pressure should be treated with medicines if weight loss and exercise are not effective.  If you are 45-79 years old, ask your health care provider if   you should take aspirin to prevent heart disease.  Diabetes screening involves taking a blood sample to check your fasting blood sugar level. This should be done once every 3 years after age 45 if you are at a normal weight and without risk factors for diabetes. Testing should be considered at a younger age or be carried out more frequently if you are overweight and have at least 1 risk factor for diabetes.  Colorectal cancer can be detected and often prevented. Most routine colorectal cancer screening begins at the age of 50  and continues through age 75. However, your health care provider may recommend screening at an earlier age if you have risk factors for colon cancer. On a yearly basis, your health care provider may provide home test kits to check for hidden blood in the stool. A small camera at the end of a tube may be used to directly examine the colon (sigmoidoscopy or colonoscopy) to detect the earliest forms of colorectal cancer. Talk to your health care provider about this at age 50 when routine screening begins. A direct exam of the colon should be repeated every 5-10 years through age 75, unless early forms of precancerous polyps or small growths are found.  People who are at an increased risk for hepatitis B should be screened for this virus. You are considered at high risk for hepatitis B if:  You were born in a country where hepatitis B occurs often. Talk with your health care provider about which countries are considered high risk.  Your parents were born in a high-risk country and you have not received a shot to protect against hepatitis B (hepatitis B vaccine).  You have HIV or AIDS.  You use needles to inject street drugs.  You live with, or have sex with, someone who has hepatitis B.  You are a man who has sex with other men (MSM).  You get hemodialysis treatment.  You take certain medicines for conditions like cancer, organ transplantation, and autoimmune conditions.  Hepatitis C blood testing is recommended for all people born from 1945 through 1965 and any individual with known risk factors for hepatitis C.  Healthy men should no longer receive prostate-specific antigen (PSA) blood tests as part of routine cancer screening. Talk to your health care provider about prostate cancer screening.  Testicular cancer screening is not recommended for adolescents or adult males who have no symptoms. Screening includes self-exam, a health care provider exam, and other screening tests. Consult with your  health care provider about any symptoms you have or any concerns you have about testicular cancer.  Practice safe sex. Use condoms and avoid high-risk sexual practices to reduce the spread of sexually transmitted infections (STIs).  You should be screened for STIs, including gonorrhea and chlamydia if:  You are sexually active and are younger than 24 years.  You are older than 24 years, and your health care provider tells you that you are at risk for this type of infection.  Your sexual activity has changed since you were last screened, and you are at an increased risk for chlamydia or gonorrhea. Ask your health care provider if you are at risk.  If you are at risk of being infected with HIV, it is recommended that you take a prescription medicine daily to prevent HIV infection. This is called pre-exposure prophylaxis (PrEP). You are considered at risk if:  You are a man who has sex with other men (MSM).  You are a heterosexual man who   is sexually active with multiple partners.  You take drugs by injection.  You are sexually active with a partner who has HIV.  Talk with your health care provider about whether you are at high risk of being infected with HIV. If you choose to begin PrEP, you should first be tested for HIV. You should then be tested every 3 months for as long as you are taking PrEP.  Use sunscreen. Apply sunscreen liberally and repeatedly throughout the day. You should seek shade when your shadow is shorter than you. Protect yourself by wearing long sleeves, pants, a wide-brimmed hat, and sunglasses year round whenever you are outdoors.  Tell your health care provider of new moles or changes in moles, especially if there is a change in shape or color. Also, tell your health care provider if a mole is larger than the size of a pencil eraser.  A one-time screening for abdominal aortic aneurysm (AAA) and surgical repair of large AAAs by ultrasound is recommended for men aged  75-75 years who are current or former smokers.  Stay current with your vaccines (immunizations). Document Released: 07/08/2007 Document Revised: 01/14/2013 Document Reviewed: 06/06/2010 [facility] Patient Information 2015 [place], [place]. This information is not intended to replace advice given to you by your health care provider. Make sure you discuss any questions you have with your health care provider. Palpitations A palpitation is the feeling that your heartbeat is irregular or is faster than normal. It may feel like your heart is fluttering or skipping a beat. Palpitations are usually not a serious problem. However, in some cases, you may need further medical evaluation. CAUSES  Palpitations can be caused by:  Smoking.  Caffeine or other stimulants, such as diet pills or energy drinks.  Alcohol.  Stress and anxiety.  Strenuous physical activity.  Fatigue.  Certain medicines.  Heart disease, especially if you have a history of irregular heart rhythms (arrhythmias), such as atrial fibrillation, atrial flutter, or supraventricular tachycardia.  An improperly working pacemaker or defibrillator. DIAGNOSIS  To find the cause of your palpitations, your health care provider will take your medical history and perform a physical exam. Your health care provider may also have you take a test called an ambulatory electrocardiogram (ECG). An ECG records your heartbeat patterns over a 24-hour period. You may also have other tests, such as:  Transthoracic echocardiogram (TTE). During echocardiography, sound waves are used to evaluate how blood flows through your heart.  Transesophageal echocardiogram (TEE).  Cardiac monitoring. This allows your health care provider to monitor your heart rate and rhythm in real time.  Holter monitor. This is a portable device that records your heartbeat and can help diagnose heart arrhythmias. It allows your health care provider to track your heart activity  for several days, if needed.  Stress tests by exercise or by giving medicine that makes the heart beat faster. TREATMENT  Treatment of palpitations depends on the cause of your symptoms and can vary greatly. Most cases of palpitations do not require any treatment other than time, relaxation, and monitoring your symptoms. Other causes, such as atrial fibrillation, atrial flutter, or supraventricular tachycardia, usually require further treatment. HOME CARE INSTRUCTIONS   Avoid:  Caffeinated coffee, tea, soft drinks, diet pills, and energy drinks.  Chocolate.  Alcohol.  Stop smoking if you smoke.  Reduce your stress and anxiety. Things that can help you relax include:  A method of controlling things in your body, such as your heartbeats, with your mind (biofeedback).  Yoga.  Meditation.  Physical activity such as swimming, jogging, or walking.  Get plenty of rest and sleep. SEEK MEDICAL CARE IF:   You continue to have a fast or irregular heartbeat beyond 24 hours.  Your palpitations occur more often. SEEK IMMEDIATE MEDICAL CARE IF:  You have chest pain or shortness of breath.  You have a severe headache.  You feel dizzy or you faint. MAKE SURE YOU:  Understand these instructions.  Will watch your condition.  Will get help right away if you are not doing well or get worse. Document Released: 01/07/2000 Document Revised: 01/14/2013 Document Reviewed: 03/10/2011 [facility] Patient Information 2015 [place], [place]. This information is not intended to replace advice given to you by your health care provider. Make sure you discuss any questions you have with your health care provider.

## 2014-05-28 NOTE — Progress Notes (Addendum)
Patient ID: Oscar Smith, male   DOB: 03/02/1945, 69 y.o.   MRN: [id number]   This chart was scribed for [name], MD by [facility], medical scribe at [facility].The patient was seen in exam room 26 and the patient's care was started at 11:57 AM.  Patient ID: Oscar Smith MRN: [id number], DOB: [birth year], 69 y.o. Date of Encounter: 05/28/2014  Primary Physician: [name], MD  Chief Complaint:  Chief Complaint  Patient presents with   Annual Exam   HPI:  Oscar Smith is a 69 y.o. male who presents to [facility] here for an annual exam.   Thumb Surgery: Pt had surgery on his right thumb. His thumb is doing well. No pain in his hands or joints.  Heart Palpations: Pt is concerned about a possible PVC, last week he developed heart palpations and shortness of breath which lasted for about 10 min. This occurs once or twice a year. His pulse was high but below 100. His mother has a heart murmur.  Cyst: Pt has noticed a very superficial sebaceous cyst at the base of his neck. The cyst is very tender and has present for some time.  Colonoscopy: His last colonoscopy was three years ago and he is overdue for one. Pt states he has had diarrhea and just recently figured out the source. He says eating eggs has been giving him diarrhea.  Prostate cancer:  Pt has a family history of prostate cancer. Last PSA was in 2013.  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 Authorizing Provider  omeprazole (PRILOSEC) 40 MG capsule Take 1 capsule (40 mg total) by mouth daily. 12/22/10  Yes Neena Rhymes, MD  sildenafil (VIAGRA) 100 MG tablet Take 0.5-1 tablets (50-100 mg total) by mouth daily as needed for erectile dysfunction. 01/30/13  Yes [name], MD  OVER THE COUNTER MEDICATION OTC Ultraflora taking prn for IB    Historical Provider, MD    Allergies: No Known Allergies  History   Social History   Marital Status: Married    Spouse Name: N/A   Number of Children: N/A   Years of Education: N/A   Occupational History   Not on file.   Social History Main Topics   Smoking status: Former Smoker    Types: Cigarettes    Quit date: 09/04/1965   Smokeless tobacco: Never Used     Comment: quit around 69 yrs old.   Alcohol Use: 10.5 oz/week    21 drink(s) per week     Comment: Beer   Drug Use: No   Sexual Activity:    Partners: Male   Other Topics Concern   Not on file   Social History Narrative   Trained as an Chief Financial Officer. Married '75- 72 years, divorced; Married '86. No children.  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 Sculptor-life size figures by commission (see work in [employer] park.) Learning the piano. Patient get regular exercise.                Review of Systems: Constitutional: negative for chills, fever, night sweats, weight changes, or fatigue  HEENT: negative for vision changes, hearing loss, congestion, rhinorrhea, ST, epistaxis, or sinus pressure Cardiovascular: negative for chest pain or palpitations Respiratory: negative for hemoptysis, wheezing, shortness of breath, or cough Abdominal: negative for abdominal pain, nausea, vomiting, diarrhea, or constipation Dermatological: negative for rash Neurologic:  negative for headache, dizziness, or syncope All other systems reviewed and are otherwise negative with the exception to those above and in the HPI.  Physical Exam: Blood pressure 130/70, pulse 72, temperature 98 F (36.7 C), temperature source Oral, resp. rate 16, height 5' 10"  (1.778 m), weight 186 lb 3.2 oz (84.46 kg), SpO2 96 %., Body mass index is 26.72 kg/(m^2). General: Well developed, well nourished, in no acute distress. Head: Normocephalic, atraumatic, eyes without discharge, sclera non-icteric, nares are without discharge.Oral cavity moist, posterior pharynx without exudate, erythema, peritonsillar abscess, or post nasal drip. Cerumen impaction bilaterally.  Neck: Supple. No thyromegaly. Full ROM. No lymphadenopathy. Lungs: Clear bilaterally to auscultation without wheezes, rales, or rhonchi. Breathing is unlabored. Heart: RRR with S1 S2. No murmurs, rubs, or gallops appreciated. Abdomen: Soft, non-tender, non-distended with normoactive bowel sounds. No hepatomegaly. No rebound/guarding. No obvious abdominal masses. Msk:  Strength and tone normal for age. Extremities/Skin: Warm and dry. No clubbing or cyanosis. No edema. No rashes or suspicious lesions. Tender subcutaneous nodule over C-7 at his posterior neck. Well healed scar on the volar surface of his distal right thumb. Neuro: Alert and oriented X 3. Moves all extremities spontaneously. Gait is normal. CNII-XII grossly in tact. Psych:  Responds to questions appropriately with a normal affect.  Bilateral cerumen impaction lavaged clear.  Mild hearing impairment noted before and after exam (patient is being evaluated for hearing aids) EKG:  NSR with first degree AV block (long PR interval) and sinus brady. Lab Results  Component Value Date   CHOL 218* 12/04/2011   HDL 38* 12/04/2011   LDLCALC 146* 12/04/2011   LDLDIRECT 124.7 12/22/2010   TRIG 172* 12/04/2011   CHOLHDL 5.7 12/04/2011     ASSESSMENT AND PLAN:  69 y.o. year  old male with palpitations and annual exam. This chart was scribed in my presence and reviewed by me personally.    ICD-9-CM ICD-10-CM   1. Annual physical exam V70.0 Z00.00 POCT urinalysis dipstick     COMPLETE METABOLIC PANEL WITH GFR     IFOBT POC (occult bld, rslt in office)     pneumococcal 13-valent conjugate vaccine (PREVNAR 13) injection 0.5 mL     CBC  2. Erectile dysfunction, unspecified erectile dysfunction type 607.84 N52.9 sildenafil (VIAGRA) 100 MG tablet  3. Palpitations 785.1 R00.2 EKG 12-Lead     EKG 12-Lead     CBC  4. Erectile dysfunction, unspecified erectile dysfunction typeChronic 607.84 N52.9 sildenafil (VIAGRA) 100 MG tablet  5. BPH (benign prostatic hyperplasia) 600.00 N40.0   6. Medicare annual wellness visit, subsequent V70.0 Z00.00 pneumococcal 13-valent conjugate vaccine (PREVNAR 13) injection 0.5 mL     CBC  7. Family history of prostate cancer V16.42 Z80.42   8. Family history of malignant neoplasm of prostate V16.42 Z80.42   9. Special screening for malignant neoplasm of prostate V76.44  Z12.5 PSA, Medicare  10. Hyperlipidemia 272.4 E78.5 Lipid panel  11. Cerumen impaction, bilateral 380.4 H61.23     Signed, [name], MD 05/28/2014 3:21 PM

## 2014-05-29 LAB — PSA, MEDICARE: PSA: 0.35 ng/mL (ref <=4.00)

## 2014-06-04 DIAGNOSIS — L720 Epidermal cyst: Secondary | ICD-10-CM | POA: Diagnosis not present

## 2014-06-04 DIAGNOSIS — C61 Malignant neoplasm of prostate: Secondary | ICD-10-CM | POA: Diagnosis not present

## 2014-06-04 DIAGNOSIS — D224 Melanocytic nevi of scalp and neck: Secondary | ICD-10-CM | POA: Diagnosis not present

## 2014-06-05 ENCOUNTER — Encounter: Payer: Self-pay | Admitting: Gastroenterology

## 2014-06-15 DIAGNOSIS — R339 Retention of urine, unspecified: Secondary | ICD-10-CM | POA: Diagnosis not present

## 2014-06-15 DIAGNOSIS — C61 Malignant neoplasm of prostate: Secondary | ICD-10-CM | POA: Diagnosis not present

## 2014-06-15 DIAGNOSIS — N5201 Erectile dysfunction due to arterial insufficiency: Secondary | ICD-10-CM | POA: Diagnosis not present

## 2014-06-15 DIAGNOSIS — N401 Enlarged prostate with lower urinary tract symptoms: Secondary | ICD-10-CM | POA: Diagnosis not present

## 2014-10-03 ENCOUNTER — Ambulatory Visit (INDEPENDENT_AMBULATORY_CARE_PROVIDER_SITE_OTHER): Payer: Medicare Other | Admitting: Family Medicine

## 2014-10-03 ENCOUNTER — Ambulatory Visit (INDEPENDENT_AMBULATORY_CARE_PROVIDER_SITE_OTHER): Payer: Medicare Other

## 2014-10-03 VITALS — BP 140/84 | HR 75 | Temp 98.2°F | Resp 18 | Ht 70.0 in | Wt 186.0 lb

## 2014-10-03 DIAGNOSIS — S4992XA Unspecified injury of left shoulder and upper arm, initial encounter: Secondary | ICD-10-CM

## 2014-10-03 DIAGNOSIS — R52 Pain, unspecified: Secondary | ICD-10-CM | POA: Diagnosis not present

## 2014-10-03 MED ORDER — PREDNISONE 20 MG PO TABS: ORAL_TABLET | ORAL | Status: DC

## 2014-10-03 NOTE — Progress Notes (Addendum)
@[facility] @  This chart was scribed for Oscar Haber, MD by Oscar Smith, ED Scribe. This patient was seen in room 11 and the patient's care was started at 8:26 AM.  Patient ID: Oscar Smith MRN: [id number], DOB: May 25, 1945, 69 y.o. Date of Encounter: 10/03/2014, 8:23 AM  Primary Physician: Oscar Haber, MD  Chief Complaint:  Chief Complaint  Patient presents with  . Shoulder Pain    thinks he twisted Lt Shoulder 2 weeks ago    HPI: 69 y.o. year old male with history below presents with left shoulder pain for 2 weeks. Pt states he was in the process of sitting down, using his left arm for assistance, when he twist his left shoulder. He reports pain with certain movement such as lifting his arm but states he's able to lift his arm with assistance without pain.   Pt work as a Development worker, community   Past Medical History  Diagnosis Date  . Inguinal hernia     right  . Diarrhea   . GERD (gastroesophageal reflux disease)   . Gastritis   . Adenocarcinoma of prostate     Gleason Grade 6, XRT     Home Meds: Prior to Admission medications   Medication Sig Start Date End Date Taking? Authorizing Provider  omeprazole (PRILOSEC) 40 MG capsule Take 1 capsule (40 mg total) by mouth daily. 12/22/10  Yes Oscar Rhymes, MD  OVER THE COUNTER MEDICATION OTC Ultraflora taking prn for IB   Yes Historical Provider, MD  sildenafil (VIAGRA) 100 MG tablet Take 0.5-1 tablets (50-100 mg total) by mouth daily as needed for erectile dysfunction. 05/28/14  Yes Oscar Haber, MD    Allergies: No Known Allergies  Social History   Social History  . Marital Status: Married    Spouse Name: N/A  . Number of Children: N/A  . Years of Education: N/A   Occupational History  . Not on file.   Social History Main Topics  . Smoking status: Former Smoker    Types: Cigarettes    Quit date: 09/04/1965  . Smokeless tobacco: Never Used     Comment: quit around 69 yrs old.  . Alcohol Use: 10.5 oz/week    21  drink(s) per week     Comment: Beer  . Drug Use: No  . Sexual Activity:    Partners: Male   Other Topics Concern  . 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 No children. 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 Sculptor-life size figures by commission (see work in [employer] park.) Learning the piano. Patient get regular exercise.                 Review of Systems: Constitutional: negative for chills, fever, night sweats, weight changes, or fatigue  HEENT: negative for vision changes, hearing loss, congestion, rhinorrhea, ST, epistaxis, or sinus pressure Cardiovascular: negative for chest pain or palpitations Respiratory: negative for hemoptysis, wheezing, shortness of breath, or cough Abdominal: negative for abdominal pain, nausea, vomiting, diarrhea, or constipation Dermatological: negative for rash Neurologic: negative for headache, dizziness, or syncope All other systems reviewed and are otherwise negative with the exception to those above and in the HPI.   Physical Exam: Blood pressure 140/84, pulse 75, temperature 98.2 F (36.8 C), temperature source Oral, resp. rate 18, height 5\' 10"  (1.778 m), weight 186 lb (84.369 kg), SpO2 98 %., Body mass index  is 26.69 kg/(m^2). General: Well developed, well nourished, in no acute distress. Head: Normocephalic, atraumatic, eyes without discharge, sclera non-icteric, nares are without discharge. Bilateral auditory canals clear, TM's are without perforation, pearly grey and translucent with reflective cone of light bilaterally. Oral cavity moist, posterior pharynx without exudate, erythema, peritonsillar abscess, or post nasal drip.  Neck: Supple. No thyromegaly. Full ROM. No lymphadenopathy. Lungs: Clear bilaterally to auscultation without wheezes, rales, or rhonchi. Breathing is unlabored. Heart: RRR  with S1 S2. No murmurs, rubs, or gallops appreciated. Abdomen: Soft, non-tender, non-distended with normoactive bowel sounds. No hepatomegaly. No rebound/guarding. No obvious abdominal masses. Msk:  Strength and tone normal for age. Extremities/Skin: Warm and dry. No clubbing or cyanosis. No edema. No rashes or suspicious lesions.marked prominence  of humeral head anteriorly. He cannot abduct the shoulder. Diffuse tenderness of the humerus head. Neuro: Alert and oriented X 3. Moves all extremities spontaneously. Gait is normal. CNII-XII grossly in tact. Psych:  Responds to questions appropriately with a normal affect.   [facility] reading (PRIMARY) by Dr. Joseph Art:  This appears to be an anterior dislocation of the left shoulder  ASSESSMENT AND PLAN:  69 y.o. year old male with a possible labral tear. He will need urgent orthopedic evaluation.  This chart was scribed in my presence and reviewed by me personally.    ICD-9-CM ICD-10-CM   1. Shoulder injury, left, initial encounter 959.2 S49.92XA DG Shoulder Left     Ambulatory referral to Orthopedic Surgery     predniSONE (DELTASONE) 20 MG tablet  2. Pain 780.96 R52 DG Shoulder Left     Ambulatory referral to Orthopedic Surgery     predniSONE (DELTASONE) 20 MG tablet     Signed, Oscar Haber, MD  Signed, Oscar Haber, MD 10/03/2014 8:23 AM

## 2014-10-05 ENCOUNTER — Encounter: Payer: Self-pay | Admitting: Podiatry

## 2014-10-05 ENCOUNTER — Ambulatory Visit (INDEPENDENT_AMBULATORY_CARE_PROVIDER_SITE_OTHER): Payer: Medicare Other | Admitting: Podiatry

## 2014-10-05 ENCOUNTER — Ambulatory Visit (INDEPENDENT_AMBULATORY_CARE_PROVIDER_SITE_OTHER): Payer: Medicare Other

## 2014-10-05 VITALS — BP 140/86 | HR 68 | Resp 16

## 2014-10-05 DIAGNOSIS — M79671 Pain in right foot: Secondary | ICD-10-CM | POA: Diagnosis not present

## 2014-10-05 DIAGNOSIS — M722 Plantar fascial fibromatosis: Secondary | ICD-10-CM | POA: Diagnosis not present

## 2014-10-05 MED ORDER — TRIAMCINOLONE ACETONIDE 10 MG/ML IJ SUSP
10.0000 mg | Freq: Once | INTRAMUSCULAR | Status: AC
  Administered 2014-10-05: 10 mg

## 2014-10-05 NOTE — Progress Notes (Signed)
Subjective:     Patient ID: Oscar Smith, male   DOB: [birth year], 69 y.o.   MRN: [id number]  HPI patient states my right heel has started to hurt me over the last 4 weeks just like my left one had and I've tried different type of insoles   Review of Systems     Objective:   Physical Exam Neurovascular status intact muscle strength adequate with inflammation plantar aspect right at the insertional point tendon the calcaneus with fluid buildup noted    Assessment:     Plantar fasciitis acute nature right heel    Plan:     Reviewed x-rays and at this time injected the right plantar fashion 3 month Kenalog 5 mill grams Xylocaine and applied fascial brace. Reappoint 2 weeks and explained physical therapy to patient

## 2014-10-05 NOTE — Patient Instructions (Signed)

## 2014-10-07 DIAGNOSIS — M25512 Pain in left shoulder: Secondary | ICD-10-CM | POA: Diagnosis not present

## 2014-10-14 DIAGNOSIS — M25512 Pain in left shoulder: Secondary | ICD-10-CM | POA: Diagnosis not present

## 2014-10-21 DIAGNOSIS — M19012 Primary osteoarthritis, left shoulder: Secondary | ICD-10-CM | POA: Diagnosis not present

## 2014-10-21 DIAGNOSIS — S46012A Strain of muscle(s) and tendon(s) of the rotator cuff of left shoulder, initial encounter: Secondary | ICD-10-CM | POA: Diagnosis not present

## 2014-10-24 HISTORY — PX: SHOULDER ARTHROSCOPY WITH SUBACROMIAL DECOMPRESSION, ROTATOR CUFF REPAIR AND BICEP TENDON REPAIR: SHX5687

## 2014-11-09 DIAGNOSIS — S43432A Superior glenoid labrum lesion of left shoulder, initial encounter: Secondary | ICD-10-CM | POA: Diagnosis not present

## 2014-11-09 DIAGNOSIS — M19012 Primary osteoarthritis, left shoulder: Secondary | ICD-10-CM | POA: Diagnosis not present

## 2014-11-09 DIAGNOSIS — S46112A Strain of muscle, fascia and tendon of long head of biceps, left arm, initial encounter: Secondary | ICD-10-CM | POA: Diagnosis not present

## 2014-11-09 DIAGNOSIS — G8918 Other acute postprocedural pain: Secondary | ICD-10-CM | POA: Diagnosis not present

## 2014-11-09 DIAGNOSIS — Y999 Unspecified external cause status: Secondary | ICD-10-CM | POA: Diagnosis not present

## 2014-11-09 DIAGNOSIS — S46012A Strain of muscle(s) and tendon(s) of the rotator cuff of left shoulder, initial encounter: Secondary | ICD-10-CM | POA: Diagnosis not present

## 2014-11-09 DIAGNOSIS — S43492A Other sprain of left shoulder joint, initial encounter: Secondary | ICD-10-CM | POA: Diagnosis not present

## 2014-11-09 DIAGNOSIS — Y929 Unspecified place or not applicable: Secondary | ICD-10-CM | POA: Diagnosis not present

## 2014-11-12 DIAGNOSIS — S46012D Strain of muscle(s) and tendon(s) of the rotator cuff of left shoulder, subsequent encounter: Secondary | ICD-10-CM | POA: Diagnosis not present

## 2014-11-16 DIAGNOSIS — S46012D Strain of muscle(s) and tendon(s) of the rotator cuff of left shoulder, subsequent encounter: Secondary | ICD-10-CM | POA: Diagnosis not present

## 2014-11-19 DIAGNOSIS — S46012D Strain of muscle(s) and tendon(s) of the rotator cuff of left shoulder, subsequent encounter: Secondary | ICD-10-CM | POA: Diagnosis not present

## 2014-11-23 DIAGNOSIS — S46012D Strain of muscle(s) and tendon(s) of the rotator cuff of left shoulder, subsequent encounter: Secondary | ICD-10-CM | POA: Diagnosis not present

## 2014-11-26 DIAGNOSIS — S46012D Strain of muscle(s) and tendon(s) of the rotator cuff of left shoulder, subsequent encounter: Secondary | ICD-10-CM | POA: Diagnosis not present

## 2014-11-30 DIAGNOSIS — S46012D Strain of muscle(s) and tendon(s) of the rotator cuff of left shoulder, subsequent encounter: Secondary | ICD-10-CM | POA: Diagnosis not present

## 2014-12-04 ENCOUNTER — Other Ambulatory Visit: Payer: Self-pay | Admitting: Family Medicine

## 2014-12-04 DIAGNOSIS — S46012D Strain of muscle(s) and tendon(s) of the rotator cuff of left shoulder, subsequent encounter: Secondary | ICD-10-CM | POA: Diagnosis not present

## 2014-12-04 MED ORDER — FLUTICASONE PROPIONATE 50 MCG/ACT NA SUSP: 2.0000 | Freq: Every day | NASAL | Status: DC

## 2014-12-07 DIAGNOSIS — S46012D Strain of muscle(s) and tendon(s) of the rotator cuff of left shoulder, subsequent encounter: Secondary | ICD-10-CM | POA: Diagnosis not present

## 2014-12-09 DIAGNOSIS — S46012D Strain of muscle(s) and tendon(s) of the rotator cuff of left shoulder, subsequent encounter: Secondary | ICD-10-CM | POA: Diagnosis not present

## 2014-12-10 DIAGNOSIS — Z4789 Encounter for other orthopedic aftercare: Secondary | ICD-10-CM | POA: Diagnosis not present

## 2014-12-11 DIAGNOSIS — Z23 Encounter for immunization: Secondary | ICD-10-CM | POA: Diagnosis not present

## 2014-12-14 DIAGNOSIS — S46012D Strain of muscle(s) and tendon(s) of the rotator cuff of left shoulder, subsequent encounter: Secondary | ICD-10-CM | POA: Diagnosis not present

## 2014-12-15 DIAGNOSIS — N481 Balanitis: Secondary | ICD-10-CM | POA: Diagnosis not present

## 2014-12-15 DIAGNOSIS — D225 Melanocytic nevi of trunk: Secondary | ICD-10-CM | POA: Diagnosis not present

## 2014-12-15 DIAGNOSIS — L821 Other seborrheic keratosis: Secondary | ICD-10-CM | POA: Diagnosis not present

## 2014-12-15 DIAGNOSIS — D1801 Hemangioma of skin and subcutaneous tissue: Secondary | ICD-10-CM | POA: Diagnosis not present

## 2014-12-16 ENCOUNTER — Ambulatory Visit (INDEPENDENT_AMBULATORY_CARE_PROVIDER_SITE_OTHER): Payer: Medicare Other | Admitting: Podiatry

## 2014-12-16 DIAGNOSIS — S46012D Strain of muscle(s) and tendon(s) of the rotator cuff of left shoulder, subsequent encounter: Secondary | ICD-10-CM | POA: Diagnosis not present

## 2014-12-16 DIAGNOSIS — M722 Plantar fascial fibromatosis: Secondary | ICD-10-CM

## 2014-12-16 NOTE — Patient Instructions (Signed)
Continue to wear your soft over-the-counter arch support Wear you fasciitis strap as we discussed Maintain stretching with ice massage to mid arch Return as needed  Bent - Knee Calf Stretch  1) Stand an arm's length away from a wall. Place the palms of your hands on the wall. Step forward about 12 inches with the opposite foot.  2) Keeping toes pointed forward and both heels on the floor, bend both knees and lean forward. Hold this position for 60 seconds. Don't arch your back and don't hunch your shoulders.  3) Repeat this twice.  DO THIS STRETCHING TECHNIQUE 3 TIMES A DAY.   Stretching Exercises before Standing  Pull your toes up toward your nose and hold for 1 minute before standing.  A towel can assist with this exercise if you put the towel under the ball of your foot. This exercise reduces the intense   pain associated when changing from a seated to a standing position. This stretch can usually be the most beneficial if done before getting out of bed in the mornings. Plantar Fasciitis Plantar fasciitis is a painful foot condition that affects the heel. It occurs when the band of tissue that connects the toes to the heel bone (plantar fascia) becomes irritated. This can happen after exercising too much or doing other repetitive activities (overuse injury). The pain from plantar fasciitis can range from mild irritation to severe pain that makes it difficult for you to walk or move. The pain is usually worse in the morning or after you have been sitting or lying down for a while. CAUSES This condition may be caused by:  Standing for long periods of time.  Wearing shoes that do not fit.  Doing high-impact activities, including running, aerobics, and ballet.  Being overweight.  Having an abnormal way of walking (gait).  Having tight calf muscles.  Having high arches in your feet.  Starting a new athletic activity. SYMPTOMS The main symptom of this condition is heel pain.  Other symptoms include:  Pain that gets worse after activity or exercise.  Pain that is worse in the morning or after resting.  Pain that goes away after you walk for a few minutes. DIAGNOSIS This condition may be diagnosed based on your signs and symptoms. Your health care provider will also do a physical exam to check for:  A tender area on the bottom of your foot.  A high arch in your foot.  Pain when you move your foot.  Difficulty moving your foot. You may also need to have imaging studies to confirm the diagnosis. These can include:  X-rays.  Ultrasound.  MRI. TREATMENT  Treatment for plantar fasciitis depends on the severity of the condition. Your treatment may include:  Rest, ice, and over-the-counter pain medicines to manage your pain.  Exercises to stretch your calves and your plantar fascia.  A splint that holds your foot in a stretched, upward position while you sleep (night splint).  Physical therapy to relieve symptoms and prevent problems in the future.  Cortisone injections to relieve severe pain.  Extracorporeal shock wave therapy (ESWT) to stimulate damaged plantar fascia with electrical impulses. It is often used as a last resort before surgery.  Surgery, if other treatments have not worked after 12 months. HOME CARE INSTRUCTIONS  Take medicines only as directed by your health care provider.  Avoid activities that cause pain.  Roll the bottom of your foot over a bag of ice or a bottle of cold water.  Do this for 20 minutes, 3-4 times a day.  Perform simple stretches as directed by your health care provider.  Try wearing athletic shoes with air-sole or gel-sole cushions or soft shoe inserts.  Wear a night splint while sleeping, if directed by your health care provider.  Keep all follow-up appointments with your health care provider. PREVENTION   Do not perform exercises or activities that cause heel pain.  Consider finding low-impact  activities if you continue to have problems.  Lose weight if you need to. The best way to prevent plantar fasciitis is to avoid the activities that aggravate your plantar fascia. SEEK MEDICAL CARE IF:  Your symptoms do not go away after treatment with home care measures.  Your pain gets worse.  Your pain affects your ability to move or do your daily activities.   This information is not intended to replace advice given to you by your health care provider. Make sure you discuss any questions you have with your health care provider.   Document Released: 10/04/2000 Document Revised: 09/30/2014 Document Reviewed: 11/19/2013 Elsevier Interactive Patient Education [employer].

## 2014-12-17 DIAGNOSIS — M722 Plantar fascial fibromatosis: Secondary | ICD-10-CM | POA: Diagnosis not present

## 2014-12-17 MED ORDER — TRIAMCINOLONE ACETONIDE 10 MG/ML IJ SUSP
10.0000 mg | Freq: Once | INTRAMUSCULAR | Status: AC
  Administered 2014-12-17: 10 mg

## 2014-12-17 NOTE — Progress Notes (Signed)
Patient ID: Oscar Smith, male   DOB: 17-Mar-1945, 69 y.o.   MRN: [id number]  Subjective: This patient presents again complaining of right inferior heel pain especially noticeable when he changed from seated standing position and first step in the morning. The pain increases with standing and walking causing patient to do reduce the amount of activity. Patient is currently wearing a athletic style shoe with a soft over-the-counter insert. He has a fascial brace which she admits she is not wearing. He said one Kenalog Injection into the heel on the visit of 10/05/2014 without any improvement of symptoms.  Objective: Orientated 3 DP and PT pulses 2/4 bilaterally No peripheral edema bilaterally Palpable tenderness medial plantar insertional area right and mid fascial band right without any palpable lesions. The insertional area right foot is more tender than the mid arch  Assessment: Insertional plantar fasciitis right Mid arch plantar fascial right  Plan: Today I reviewed the results examination with patient in detail He will maintain his athletic style shoes with over-the-counter soft inserts as well as a soft gel pad if he finds comfortable. Also, I want him to wear the fascial brace on the right foot ongoing continuous basis. I offered patient additional Kenalog injection and he verbally consents  Skin is prepped with alcohol and Betadine and 10 mg of Kenalog mixed with 2.5 mg of plain Marcaine and 10 mg of plain Xylocaine injected inferior heel right for Kenalog injection #2  Also patient will maintain stretching and ice massage  Reappoint at patient's request

## 2014-12-21 DIAGNOSIS — S46012D Strain of muscle(s) and tendon(s) of the rotator cuff of left shoulder, subsequent encounter: Secondary | ICD-10-CM | POA: Diagnosis not present

## 2014-12-24 DIAGNOSIS — S46012D Strain of muscle(s) and tendon(s) of the rotator cuff of left shoulder, subsequent encounter: Secondary | ICD-10-CM | POA: Diagnosis not present

## 2014-12-28 DIAGNOSIS — S46012D Strain of muscle(s) and tendon(s) of the rotator cuff of left shoulder, subsequent encounter: Secondary | ICD-10-CM | POA: Diagnosis not present

## 2014-12-31 DIAGNOSIS — S46012D Strain of muscle(s) and tendon(s) of the rotator cuff of left shoulder, subsequent encounter: Secondary | ICD-10-CM | POA: Diagnosis not present

## 2015-01-04 DIAGNOSIS — S46012D Strain of muscle(s) and tendon(s) of the rotator cuff of left shoulder, subsequent encounter: Secondary | ICD-10-CM | POA: Diagnosis not present

## 2015-01-07 DIAGNOSIS — Z4789 Encounter for other orthopedic aftercare: Secondary | ICD-10-CM | POA: Diagnosis not present

## 2015-01-07 DIAGNOSIS — S46012D Strain of muscle(s) and tendon(s) of the rotator cuff of left shoulder, subsequent encounter: Secondary | ICD-10-CM | POA: Diagnosis not present

## 2015-01-13 ENCOUNTER — Ambulatory Visit (INDEPENDENT_AMBULATORY_CARE_PROVIDER_SITE_OTHER): Payer: Medicare Other | Admitting: Podiatry

## 2015-01-13 ENCOUNTER — Encounter: Payer: Self-pay | Admitting: Podiatry

## 2015-01-13 VITALS — BP 141/78 | HR 90 | Resp 12

## 2015-01-13 DIAGNOSIS — M722 Plantar fascial fibromatosis: Secondary | ICD-10-CM

## 2015-01-13 MED ORDER — PREDNISONE 10 MG (21) PO TBPK: ORAL_TABLET | ORAL | Status: DC

## 2015-01-13 NOTE — Patient Instructions (Signed)
Today your examination confirmed tenderness in the midsection of the fascial band on your right foot, consistent with ongoing plantar fasciitis I applied a fascial taping to reduce tension on the fascial band and if possible keep dry leave on up to 5 days Also, I sent a prescription to your pharmacy for a 10 mg tapering prednisone dose pack On day 1 take 6 tablets over you waking hours On day 2 take 5 tablets over you waking hours O on day 3 take 4 tablets orally waking hours On day 4 for take 3 tablets orally waking hours On day 5 take 2 tablets over you waking hours On day 6 take one tablet If you need anything for pain control use Tylenol

## 2015-01-13 NOTE — Progress Notes (Signed)
   Subjective:    Patient ID: Oscar Smith, male    DOB: Apr 24, 1945, 69 y.o.   MRN: [id number]  HPI This patient presents again complaining of increasing pain in the last 2 days was standing walking in the right fascial band area. Patient has history of ongoing plantar fasciitis right and a history of 2 Kenalog injections in inferior heel right. At this time patient admits that he has increased his standing and walking and the pain is increased proportionally. He describes limping when walking causing some right hip pain as well. Currently patient is wearing fascial band on the right foot with a soft over-the-counter arch support   Review of Systems  Musculoskeletal: Positive for gait problem.       Objective:   Physical Exam  Orientated 3  Vascular: No peripheral edema noted bilaterally DP and PT pulses 2/4 bilaterally  Neurological: Deferred  Dermatological: No skin lesions noted bilaterally  Musculoskeletal: There is no palpable tenderness in medial plantar fascial insertional area right Palpable tenderness in the medial central fascial band mid section right without any palpable lesions. This duplicates patient's area of discomfort. There is no evidence of erythema, edema, ecchymosis in the plantar arch of the right foot      Assessment & Plan:   Assessment: Resolved insertional plantar fasciitis right Ongoing mid arch plantar fasciitis right Gait disturbance associated with plantar fasciitis right  Plan: Today review the results of the examination with patient today. I made aware that his problem still was ongoing plantar fasciitis. A fascial taping was applied to the right foot with instructions to leave on if possible up to 5 days. Upon removal of the fascial taping patient will resume fasciitis strap a soft insert and stretching Rx 6 day 10 mg prednisone tapering Dosepak, no refill  After the symptoms of mid arch fasciitis or resolving will recommend a  prescription foot orthotic  Reappoint 7 days for reapplication of fascial taping

## 2015-01-14 DIAGNOSIS — S46012D Strain of muscle(s) and tendon(s) of the rotator cuff of left shoulder, subsequent encounter: Secondary | ICD-10-CM | POA: Diagnosis not present

## 2015-01-19 ENCOUNTER — Encounter: Payer: Self-pay | Admitting: Podiatry

## 2015-01-19 ENCOUNTER — Ambulatory Visit (INDEPENDENT_AMBULATORY_CARE_PROVIDER_SITE_OTHER): Payer: Medicare Other | Admitting: Podiatry

## 2015-01-19 VITALS — BP 173/96 | HR 64 | Resp 14

## 2015-01-19 DIAGNOSIS — M722 Plantar fascial fibromatosis: Secondary | ICD-10-CM

## 2015-01-19 NOTE — Patient Instructions (Signed)
Today your examination demonstrated decrease arch pain or plantar fasciitis If possible leave the office taping on the right foot up to 5 days After the taping is removed continue to apply 2 inch tape over prewrap as described in the office daily on an ongoing basis As tolerated okay to continue wearing a fasciitis strap as well as your soft arch support If symptoms tend to worsen over time we will consider a custom foot orthotic

## 2015-01-19 NOTE — Progress Notes (Signed)
Patient ID: Oscar Smith, male   DOB: 03-Jan-1946, 69 y.o.   MRN: [id number]  Subjective: Patient presents for ongoing care for plantar fasciitis right. He said 2 Kenalog injections right heel with temporary relief. On the visit of 01/13/2015 a fascial taping was applied and a 6 day 10 mg prednisone Dosepak prescribed. Patient states states that the right arch is feeling considerably better. He completed all 41 doses of prednisone  Objective: Orientated 3 No peripheral edema noted bilaterally Mild palpable tenderness mid fascial band right without any palpable lesions  Assessment: Reducing symptoms of plantar fasciitis right  Plan: Reapply fascial taping right with instructions to leave on if possible 5 days Discussed the use of 2 inch tape over prewrap daily after removal fascial taping Okay to continue fasciitis strap and soft shoe insert as tolerated  Reappoint at patient's request

## 2015-01-20 DIAGNOSIS — S46012D Strain of muscle(s) and tendon(s) of the rotator cuff of left shoulder, subsequent encounter: Secondary | ICD-10-CM | POA: Diagnosis not present

## 2015-01-27 DIAGNOSIS — S46012D Strain of muscle(s) and tendon(s) of the rotator cuff of left shoulder, subsequent encounter: Secondary | ICD-10-CM | POA: Diagnosis not present

## 2015-02-03 DIAGNOSIS — S46012D Strain of muscle(s) and tendon(s) of the rotator cuff of left shoulder, subsequent encounter: Secondary | ICD-10-CM | POA: Diagnosis not present

## 2015-02-04 DIAGNOSIS — S46012D Strain of muscle(s) and tendon(s) of the rotator cuff of left shoulder, subsequent encounter: Secondary | ICD-10-CM | POA: Diagnosis not present

## 2015-02-04 DIAGNOSIS — S43432D Superior glenoid labrum lesion of left shoulder, subsequent encounter: Secondary | ICD-10-CM | POA: Diagnosis not present

## 2015-02-16 ENCOUNTER — Telehealth: Payer: Self-pay | Admitting: *Deleted

## 2015-02-16 NOTE — Telephone Encounter (Signed)
Pt states he went to the [facility] and bought a heel cup and that with the black arch brace given by Dr. Amalia Hailey, his foot is 80% better and he will try this for a while.

## 2015-03-10 DIAGNOSIS — M722 Plantar fascial fibromatosis: Secondary | ICD-10-CM | POA: Diagnosis not present

## 2015-03-12 DIAGNOSIS — H2513 Age-related nuclear cataract, bilateral: Secondary | ICD-10-CM | POA: Diagnosis not present

## 2015-04-01 DIAGNOSIS — C61 Malignant neoplasm of prostate: Secondary | ICD-10-CM | POA: Diagnosis not present

## 2015-04-01 DIAGNOSIS — R350 Frequency of micturition: Secondary | ICD-10-CM | POA: Diagnosis not present

## 2015-04-01 DIAGNOSIS — Z0000 Encounter for general adult medical examination without abnormal findings: Secondary | ICD-10-CM | POA: Diagnosis not present

## 2015-04-12 ENCOUNTER — Telehealth: Payer: Self-pay

## 2015-04-13 ENCOUNTER — Other Ambulatory Visit: Payer: Self-pay | Admitting: Urology

## 2015-04-26 DIAGNOSIS — H0015 Chalazion left lower eyelid: Secondary | ICD-10-CM | POA: Diagnosis not present

## 2015-05-24 DIAGNOSIS — H0015 Chalazion left lower eyelid: Secondary | ICD-10-CM | POA: Diagnosis not present

## 2015-05-31 ENCOUNTER — Encounter (HOSPITAL_BASED_OUTPATIENT_CLINIC_OR_DEPARTMENT_OTHER): Payer: Self-pay | Admitting: *Deleted

## 2015-06-01 ENCOUNTER — Encounter (HOSPITAL_BASED_OUTPATIENT_CLINIC_OR_DEPARTMENT_OTHER): Payer: Self-pay | Admitting: *Deleted

## 2015-06-01 DIAGNOSIS — K219 Gastro-esophageal reflux disease without esophagitis: Secondary | ICD-10-CM | POA: Diagnosis not present

## 2015-06-01 DIAGNOSIS — K5730 Diverticulosis of large intestine without perforation or abscess without bleeding: Secondary | ICD-10-CM | POA: Diagnosis not present

## 2015-06-01 DIAGNOSIS — Z86010 Personal history of colonic polyps: Secondary | ICD-10-CM | POA: Diagnosis not present

## 2015-06-01 DIAGNOSIS — K449 Diaphragmatic hernia without obstruction or gangrene: Secondary | ICD-10-CM | POA: Diagnosis not present

## 2015-06-01 NOTE — Progress Notes (Signed)
To [facility] at 1030-Hg on arrival-Instructed Npo after Mn solids-clear liquids only(no dairy,juice with pulp)  until 0530-Npo may take omeprazole,flonase in am.

## 2015-06-09 ENCOUNTER — Ambulatory Visit (HOSPITAL_BASED_OUTPATIENT_CLINIC_OR_DEPARTMENT_OTHER)
Admission: RE | Admit: 2015-06-09 | Discharge: 2015-06-09 | Disposition: A | Discharge status: Home / Self Care | Payer: Medicare Other | Source: Ambulatory Visit | Attending: Urology | Admitting: Urology

## 2015-06-09 ENCOUNTER — Encounter (HOSPITAL_BASED_OUTPATIENT_CLINIC_OR_DEPARTMENT_OTHER)
Admission: RE | Disposition: A | Discharge status: Home / Self Care | Payer: Self-pay | Source: Ambulatory Visit | Attending: Urology

## 2015-06-09 ENCOUNTER — Ambulatory Visit (HOSPITAL_BASED_OUTPATIENT_CLINIC_OR_DEPARTMENT_OTHER): Payer: Medicare Other | Admitting: Anesthesiology

## 2015-06-09 ENCOUNTER — Encounter (HOSPITAL_BASED_OUTPATIENT_CLINIC_OR_DEPARTMENT_OTHER): Payer: Self-pay | Admitting: *Deleted

## 2015-06-09 DIAGNOSIS — N401 Enlarged prostate with lower urinary tract symptoms: Secondary | ICD-10-CM | POA: Diagnosis not present

## 2015-06-09 DIAGNOSIS — Z87891 Personal history of nicotine dependence: Secondary | ICD-10-CM | POA: Insufficient documentation

## 2015-06-09 DIAGNOSIS — R350 Frequency of micturition: Secondary | ICD-10-CM | POA: Insufficient documentation

## 2015-06-09 DIAGNOSIS — G4730 Sleep apnea, unspecified: Secondary | ICD-10-CM | POA: Diagnosis not present

## 2015-06-09 DIAGNOSIS — N478 Other disorders of prepuce: Secondary | ICD-10-CM | POA: Diagnosis not present

## 2015-06-09 DIAGNOSIS — N471 Phimosis: Secondary | ICD-10-CM | POA: Diagnosis not present

## 2015-06-09 DIAGNOSIS — Z8546 Personal history of malignant neoplasm of prostate: Secondary | ICD-10-CM | POA: Diagnosis not present

## 2015-06-09 HISTORY — DX: Diaphragmatic hernia without obstruction or gangrene: K44.9

## 2015-06-09 HISTORY — DX: Male erectile dysfunction, unspecified: N52.9

## 2015-06-09 HISTORY — DX: Personal history of colonic polyps: Z86.010

## 2015-06-09 HISTORY — DX: Presence of external hearing-aid: Z97.4

## 2015-06-09 HISTORY — DX: Personal history of other diseases of the digestive system: Z87.19

## 2015-06-09 HISTORY — PX: CIRCUMCISION: SHX1350

## 2015-06-09 HISTORY — DX: Unspecified symptoms and signs involving the genitourinary system: R39.9

## 2015-06-09 HISTORY — DX: Benign prostatic hyperplasia without lower urinary tract symptoms: N40.0

## 2015-06-09 HISTORY — DX: Personal history of colon polyps, unspecified: Z86.0100

## 2015-06-09 HISTORY — DX: Phimosis: N47.1

## 2015-06-09 LAB — POCT HEMOGLOBIN-HEMACUE: Hemoglobin: 15.1 g/dL (ref 13.0–17.0)

## 2015-06-09 SURGERY — CIRCUMCISION, ADULT
Anesthesia: General

## 2015-06-09 MED ORDER — MIDAZOLAM HCL 2 MG/2ML IJ SOLN
INTRAMUSCULAR | Status: AC
  Filled 2015-06-09: qty 2

## 2015-06-09 MED ORDER — LIDOCAINE HCL (PF) 1 % IJ SOLN
INTRAMUSCULAR | Status: DC | PRN
  Administered 2015-06-09: 5 mL

## 2015-06-09 MED ORDER — ONDANSETRON HCL 4 MG/2ML IJ SOLN
INTRAMUSCULAR | Status: DC | PRN
Start: 2015-06-09 — End: 2015-06-09
  Administered 2015-06-09: 4 mg via INTRAVENOUS

## 2015-06-09 MED ORDER — CLINDAMYCIN PHOSPHATE 900 MG/50ML IV SOLN
INTRAVENOUS | Status: AC
  Filled 2015-06-09: qty 50

## 2015-06-09 MED ORDER — PROPOFOL 10 MG/ML IV BOLUS
INTRAVENOUS | Status: AC
  Filled 2015-06-09: qty 40

## 2015-06-09 MED ORDER — SENNOSIDES-DOCUSATE SODIUM 8.6-50 MG PO TABS: 1.0000 | ORAL_TABLET | Freq: Two times a day (BID) | ORAL | Status: DC

## 2015-06-09 MED ORDER — KETOROLAC TROMETHAMINE 30 MG/ML IJ SOLN
INTRAMUSCULAR | Status: AC
  Filled 2015-06-09: qty 1

## 2015-06-09 MED ORDER — NALOXONE HCL 0.4 MG/ML IJ SOLN
INTRAMUSCULAR | Status: AC
  Filled 2015-06-09: qty 1

## 2015-06-09 MED ORDER — LIDOCAINE HCL (CARDIAC) 20 MG/ML IV SOLN
INTRAVENOUS | Status: AC
  Filled 2015-06-09: qty 5

## 2015-06-09 MED ORDER — LACTATED RINGERS IV SOLN
INTRAVENOUS | Status: DC
  Administered 2015-06-09: 11:00:00 via INTRAVENOUS
  Filled 2015-06-09: qty 1000

## 2015-06-09 MED ORDER — DEXAMETHASONE SODIUM PHOSPHATE 10 MG/ML IJ SOLN
INTRAMUSCULAR | Status: AC
  Filled 2015-06-09: qty 1

## 2015-06-09 MED ORDER — FENTANYL CITRATE (PF) 100 MCG/2ML IJ SOLN
INTRAMUSCULAR | Status: DC | PRN
  Administered 2015-06-09: 100 ug via INTRAVENOUS

## 2015-06-09 MED ORDER — BUPIVACAINE HCL 0.25 % IJ SOLN
INTRAMUSCULAR | Status: DC | PRN
  Administered 2015-06-09: 5 mL

## 2015-06-09 MED ORDER — LIDOCAINE HCL (CARDIAC) 20 MG/ML IV SOLN
INTRAVENOUS | Status: DC | PRN
  Administered 2015-06-09: 75 mg via INTRAVENOUS

## 2015-06-09 MED ORDER — OXYCODONE-ACETAMINOPHEN 5-325 MG PO TABS: 1.0000 | ORAL_TABLET | ORAL | Status: DC | PRN

## 2015-06-09 MED ORDER — EPHEDRINE SULFATE-NACL 50-0.9 MG/10ML-% IV SOSY
PREFILLED_SYRINGE | INTRAVENOUS | Status: DC | PRN
  Administered 2015-06-09: 10 mg via INTRAVENOUS
  Administered 2015-06-09: 15 mg via INTRAVENOUS

## 2015-06-09 MED ORDER — FENTANYL CITRATE (PF) 100 MCG/2ML IJ SOLN
INTRAMUSCULAR | Status: AC
  Filled 2015-06-09: qty 4

## 2015-06-09 MED ORDER — MIDAZOLAM HCL 5 MG/5ML IJ SOLN
INTRAMUSCULAR | Status: DC | PRN
  Administered 2015-06-09: 2 mg via INTRAVENOUS

## 2015-06-09 MED ORDER — GLYCOPYRROLATE 0.2 MG/ML IJ SOLN
INTRAMUSCULAR | Status: AC
  Filled 2015-06-09: qty 4

## 2015-06-09 MED ORDER — SULFAMETHOXAZOLE-TRIMETHOPRIM 800-160 MG PO TABS: 1.0000 | ORAL_TABLET | Freq: Two times a day (BID) | ORAL | Status: DC

## 2015-06-09 MED ORDER — PROPOFOL 10 MG/ML IV BOLUS
INTRAVENOUS | Status: DC | PRN
  Administered 2015-06-09: 200 mg via INTRAVENOUS

## 2015-06-09 MED ORDER — DEXAMETHASONE SODIUM PHOSPHATE 4 MG/ML IJ SOLN
INTRAMUSCULAR | Status: DC | PRN
  Administered 2015-06-09: 10 mg via INTRAVENOUS

## 2015-06-09 MED ORDER — ONDANSETRON HCL 4 MG/2ML IJ SOLN
INTRAMUSCULAR | Status: AC
  Filled 2015-06-09: qty 2

## 2015-06-09 MED ORDER — PROMETHAZINE HCL 25 MG/ML IJ SOLN
6.2500 mg | INTRAMUSCULAR | Status: DC | PRN
  Filled 2015-06-09: qty 1

## 2015-06-09 MED ORDER — CLINDAMYCIN PHOSPHATE 900 MG/50ML IV SOLN
900.0000 mg | INTRAVENOUS | Status: AC
  Administered 2015-06-09: 600 mg via INTRAVENOUS
  Filled 2015-06-09: qty 50

## 2015-06-09 MED ORDER — HYDROMORPHONE HCL 1 MG/ML IJ SOLN
0.2500 mg | INTRAMUSCULAR | Status: DC | PRN
  Filled 2015-06-09: qty 1

## 2015-06-09 MED ORDER — EPHEDRINE SULFATE 50 MG/ML IJ SOLN
INTRAMUSCULAR | Status: AC
  Filled 2015-06-09: qty 3

## 2015-06-09 SURGICAL SUPPLY — 32 items
BANDAGE COBAN STERILE 2 (GAUZE/BANDAGES/DRESSINGS) ×2 IMPLANT
BLADE SURG 15 STRL LF DISP TIS (BLADE) ×1 IMPLANT
BLADE SURG 15 STRL SS (BLADE) ×2
BNDG CONFORM 2 STRL LF (GAUZE/BANDAGES/DRESSINGS) ×2 IMPLANT
COVER BACK TABLE 60X90IN (DRAPES) ×2 IMPLANT
COVER MAYO STAND STRL (DRAPES) ×2 IMPLANT
DECANTER SPIKE VIAL GLASS SM (MISCELLANEOUS) IMPLANT
DRAPE LAPAROTOMY 100X72 PEDS (DRAPES) ×2 IMPLANT
DRAPE LAPAROTOMY T 102X78X121 (DRAPES) ×2 IMPLANT
ELECT NDL TIP 2.8 STRL (NEEDLE) ×1 IMPLANT
ELECT NEEDLE TIP 2.8 STRL (NEEDLE) ×2 IMPLANT
ELECT REM PT RETURN 9FT ADLT (ELECTROSURGICAL) ×2
ELECTRODE REM PT RTRN 9FT ADLT (ELECTROSURGICAL) ×1 IMPLANT
GAUZE SPONGE 4X4 16PLY XRAY LF (GAUZE/BANDAGES/DRESSINGS) ×2 IMPLANT
GAUZE XEROFORM 1X8 LF (GAUZE/BANDAGES/DRESSINGS) ×2 IMPLANT
GLOVE BIO SURGEON STRL SZ7.5 (GLOVE) ×2 IMPLANT
GOWN STRL REUS W/ TWL XL LVL3 (GOWN DISPOSABLE) ×1 IMPLANT
GOWN STRL REUS W/TWL XL LVL3 (GOWN DISPOSABLE) ×2
KIT ROOM TURNOVER WOR (KITS) ×2 IMPLANT
MANIFOLD NEPTUNE II (INSTRUMENTS) IMPLANT
NDL HYPO 25X1 1.5 SAFETY (NEEDLE) IMPLANT
NEEDLE HYPO 25X1 1.5 SAFETY (NEEDLE) IMPLANT
NS IRRIG 500ML POUR BTL (IV SOLUTION) IMPLANT
PACK BASIN DAY SURGERY FS (CUSTOM PROCEDURE TRAY) ×2 IMPLANT
PENCIL BUTTON HOLSTER BLD 10FT (ELECTRODE) ×2 IMPLANT
SPONGE GAUZE 4X4 12PLY (GAUZE/BANDAGES/DRESSINGS) ×2 IMPLANT
SUT VICRYL 4-0 PS2 18IN ABS (SUTURE) ×4 IMPLANT
SYR CONTROL 10ML LL (SYRINGE) IMPLANT
TOWEL OR 17X26 10 PK STRL BLUE (TOWEL DISPOSABLE) ×2 IMPLANT
TRAY DSU PREP LF (CUSTOM PROCEDURE TRAY) ×2 IMPLANT
TUBE CONNECTING 12X1/4 (SUCTIONS) IMPLANT
WATER STERILE IRR 500ML POUR (IV SOLUTION) IMPLANT

## 2015-06-09 NOTE — Brief Op Note (Signed)
06/09/2015  1:18 PM  PATIENT:  Oscar Smith  70 y.o. male  PRE-OPERATIVE DIAGNOSIS:  PHIMOSIS  POST-OPERATIVE DIAGNOSIS:  PHIMOSIS  PROCEDURE:  Procedure(s): CIRCUMCISION ADULT WITH PENILE BLOCK (N/A)  SURGEON:  Surgeon(s) and Role:    * Alexis Frock, MD - Primary  PHYSICIAN ASSISTANT:   ASSISTANTS: none   ANESTHESIA:   local and general  EBL:  Total I/O In: 400 [I.V.:400] Out: 5 [Blood:5]  BLOOD ADMINISTERED:none  DRAINS: none   LOCAL MEDICATIONS USED:  MARCAINE     SPECIMEN:  Source of Specimen:  phimotic foreskin  DISPOSITION OF SPECIMEN:  PATHOLOGY  COUNTS:  YES  TOURNIQUET:  * No tourniquets in log *  DICTATION: .Other Dictation: Dictation Number [id number]  PLAN OF CARE: Discharge to home after PACU  PATIENT DISPOSITION:  PACU - hemodynamically stable.   Delay start of Pharmacological VTE agent (>24hrs) due to surgical blood loss or risk of bleeding: yes

## 2015-06-09 NOTE — Transfer of Care (Signed)
Immediate Anesthesia Transfer of Care Note  Patient: Oscar Smith  Procedure(s) Performed: Procedure(s): CIRCUMCISION ADULT WITH PENILE BLOCK (N/A)  Patient Location: PACU  Anesthesia Type:General  Level of Consciousness: awake, alert , oriented and patient cooperative  Airway & Oxygen Therapy: Patient Spontanous Breathing and Patient connected to nasal cannula oxygen  Post-op Assessment: Report given to RN and Post -op Vital signs reviewed and stable  Post vital signs: Reviewed and stable  Last Vitals:  Filed Vitals:   06/09/15 1044  BP: 172/90  Pulse: 73  Temp: 36.7 C  Resp: 16    Last Pain: There were no vitals filed for this visit.    Patients Stated Pain Goal: 5 ([id number])  Complications: No apparent anesthesia complications

## 2015-06-09 NOTE — Anesthesia Preprocedure Evaluation (Addendum)
Anesthesia Evaluation  Patient identified by MRN, date of birth, ID band Patient awake    Reviewed: Allergy & Precautions, NPO status , Patient's Chart, lab work & pertinent test results  Airway Mallampati: II  TM Distance: >3 FB Neck ROM: Full    Dental  (+) Poor Dentition, Dental Advisory Given, Caps, Chipped   Pulmonary sleep apnea , former smoker,    Pulmonary exam normal        Cardiovascular negative cardio ROS Normal cardiovascular exam Rhythm:Regular Rate:Normal     Neuro/Psych negative neurological ROS  negative psych ROS   GI/Hepatic Neg liver ROS, hiatal hernia, GERD  Medicated and Controlled,Admits to Daily ETOH use   Endo/Other  negative endocrine ROS  Renal/GU negative Renal ROS  negative genitourinary   Musculoskeletal negative musculoskeletal ROS (+)   Abdominal   Peds negative pediatric ROS (+)  Hematology negative hematology ROS (+)   Anesthesia Other Findings   Reproductive/Obstetrics negative OB ROS                          Anesthesia Physical Anesthesia Plan  ASA: II  Anesthesia Plan: General   Post-op Pain Management:    Induction: Intravenous  Airway Management Planned: LMA  Additional Equipment:   Intra-op Plan:   Post-operative Plan: Extubation in OR  Informed Consent: I have reviewed the patients History and Physical, chart, labs and discussed the procedure including the risks, benefits and alternatives for the proposed anesthesia with the patient or authorized representative who has indicated his/her understanding and acceptance.   Dental advisory given  Plan Discussed with: Anesthesiologist and CRNA  Anesthesia Plan Comments:         Anesthesia Quick Evaluation

## 2015-06-09 NOTE — H&P (Signed)
Oscar Smith is an 70 y.o. male.    Chief Complaint: Pre-op Circmcision  HPI:   1 - Low Risk Prostate Cancer - s/p IMRT 2012 (Dr. Valere Dross) for 4/12 cores Gleason 6 (Dr. Bernardo Heater). TRUS 87gm at time. Recent PSA History: 06/2013 - 0.31; 11/2013 -  0.32 05/2014 - 0.30  2 - Enlarged Prostate With Urinary Frequency, Incomplete Emptying - Long h/o obstructive and irritative LUTS. Now managed with PT regimen, Dietary modification. PVR's 75-164mL w/o prior frank retention. DRE 80gm +.   3 -  Phimosis - pt with progressive narrowing of foreskin x several years, becoming more problematic.  Not unable to reduce and interfearing with hygeine.,   PMH unremarkable. His PCP is [name] MD  Today Oscar Smith is seen to proceed with elective circumcision. No interval fevers.   Past Medical History  Diagnosis Date  . GERD (gastroesophageal reflux disease)   . Hiatal hernia   . History of colon polyps     2002 and 2003 hyperplastic  . ED (erectile dysfunction) of organic origin   . BPH (benign prostatic hyperplasia)   . Lower urinary tract symptoms (LUTS)   . Wears hearing aid     bilateral  . History of gastritis   . Phimosis   . Adenocarcinoma of prostate [facility]) urologist-  dr Tresa Moore--  dx Nov 2011--  T1c, Gleason 3+3,  PSA 4.2 (post IMRT 03/2010    Low risk recurrence, Biochemical control,  last PSA 0.30 on 05-28-2014    Past Surgical History  Procedure Laterality Date  . Abdominal hernia repair    . Prostate surgery  2009  . Colonoscopy  last one 11-15-2011  . Inguinal hernia repair Right 12-23-2009  . Shoulder arthroscopy with subacromial decompression, rotator cuff repair and bicep tendon repair Left 10-2014    Family History  Problem Relation Age of Onset  . Stomach cancer Father   . Pancreatic cancer Sister   . Cancer Sister    Social History:  reports that he quit smoking about 49 years ago. His smoking use included Cigarettes. He has never used smokeless tobacco. He  reports that he drinks about 19.8 oz of alcohol per week. He reports that he does not use illicit drugs.  Allergies: No Known Allergies  No prescriptions prior to admission    No results found for this or any previous visit (from the past 48 hour(s)). No results found.  Review of Systems  Constitutional: Negative.  Negative for fever.  HENT: Negative.   Eyes: Negative.   Respiratory: Negative.   Cardiovascular: Negative.   Gastrointestinal: Negative.   Genitourinary: Negative.   Musculoskeletal: Negative.   Skin: Negative.   Neurological: Negative.   Endo/Heme/Allergies: Negative.   Psychiatric/Behavioral: Negative.     Height 5\' 10"  (1.778 m), weight 82.555 kg (182 lb). Physical Exam  Constitutional: He is oriented to person, place, and time. He appears well-developed.  HENT:  Head: Normocephalic.  Eyes: Pupils are equal, round, and reactive to light.  Neck: Normal range of motion.  Cardiovascular: Normal rate.   Respiratory: Effort normal.  GI: Soft.  Genitourinary:  Significant phimosis, unable to retract foreskin, no severe active balanoposthitis.   Musculoskeletal: Normal range of motion.  Neurological: He is oriented to person, place, and time.  Skin: Skin is warm.  Psychiatric: He has a normal mood and affect. His behavior is normal. Judgment and thought content normal.     Assessment/Plan     1 - Low Risk Prostate Cancer -  excellent biochemical control for low risk disease. Transition to yearly PSA.   2 - Enlarged Prostate With Urinary Frequency, Incomplete Emptying - continue  PT regimen. Should symptoms worsen or retention develop would need cysto, UDS and consider channel TURP if obstructed.   3 -Phimosis -  progressive. Rec circ v. dorsal slit, he has tried topical steroid and now refracotry. He wants to proceed with circumcision.  We rediscussed the natural history of phimosis with progressive scarring / tightening of the foreskin with phimotic ring  which can be painful and interfere with sexual function as well as hygiene. We also rediscussed the increased risk of penile cancer in patients with chronic phimosis and poor hygiene. I mentioned that the condition is exclusive to men without prior adequate circumcision and is especially prominent in diabetics.  We rediscussed management options of local therapy (steroid creams, etc.) and surgical therapy with dorsal slit versus circumcision. We rediscussed risks of circumcision including poor cosmesis ("taking too much"), decreased penile sensation and sexual pleasure, wound problems as well as bleeding and rare risks including DVT, PE, MI, CVA, Mortality.  Af  Oscar Frock, MD 06/09/2015, 6:34 AM

## 2015-06-09 NOTE — Anesthesia Procedure Notes (Signed)
Procedure Name: LMA Insertion Date/Time: 06/09/2015 12:38 PM Performed by: Wanita Chamberlain Pre-anesthesia Checklist: Patient identified, Timeout performed, Emergency Drugs available, Suction available and Patient being monitored Patient Re-evaluated:Patient Re-evaluated prior to inductionOxygen Delivery Method: Circle system utilized Preoxygenation: Pre-oxygenation with 100% oxygen Intubation Type: IV induction Ventilation: Mask ventilation without difficulty LMA: LMA inserted LMA Size: 4.0 Number of attempts: 1 Placement Confirmation: breath sounds checked- equal and bilateral and positive ETCO2 Tube secured with: Tape Dental Injury: Teeth and Oropharynx as per pre-operative assessment

## 2015-06-09 NOTE — Discharge Instructions (Signed)
1 - Remove dressing tomorrow morning. All stitches are dissolvable and should disappear over 2 weeks. You may shower starting tomorrow.   2 - Call MD or go to ER for fever >102, severe pain / nausea / vomiting not relieved by medications, or acute change in medical status  Post Anesthesia Home Care Instructions  Activity: Get plenty of rest for the remainder of the day. A responsible adult should stay with you for 24 hours following the procedure.  For the next 24 hours, DO NOT: -Drive a car -Paediatric nurse -Drink alcoholic beverages -Take any medication unless instructed by your physician -Make any legal decisions or sign important papers.  Meals: Start with liquid foods such as gelatin or soup. Progress to regular foods as tolerated. Avoid greasy, spicy, heavy foods. If nausea and/or vomiting occur, drink only clear liquids until the nausea and/or vomiting subsides. Call your physician if vomiting continues.  Special Instructions/Symptoms: Your throat may feel dry or sore from the anesthesia or the breathing tube placed in your throat during surgery. If this causes discomfort, gargle with warm salt water. The discomfort should disappear within 24 hours.  If you had a scopolamine patch placed behind your ear for the management of post- operative nausea and/or vomiting:  1. The medication in the patch is effective for 72 hours, after which it should be removed.  Wrap patch in a tissue and discard in the trash. Wash hands thoroughly with soap and water. 2. You may remove the patch earlier than 72 hours if you experience unpleasant side effects which may include dry mouth, dizziness or visual disturbances. 3. Avoid touching the patch. Wash your hands with soap and water after contact with the patch.

## 2015-06-09 NOTE — Anesthesia Postprocedure Evaluation (Signed)
Anesthesia Post Note  Patient: Oscar Smith  Procedure(s) Performed: Procedure(s) (LRB): CIRCUMCISION ADULT WITH PENILE BLOCK (N/A)  Patient location during evaluation: PACU Anesthesia Type: General Level of consciousness: sedated Pain management: pain level controlled Vital Signs Assessment: post-procedure vital signs reviewed and stable Respiratory status: spontaneous breathing and respiratory function stable Cardiovascular status: stable Anesthetic complications: no    Last Vitals:  Filed Vitals:   06/09/15 1400 06/09/15 1415  BP: 158/90 157/96  Pulse: 85 85  Temp:    Resp: 13 14    Last Pain:  Filed Vitals:   06/09/15 1418  PainSc: 0-No pain                 Lachina Salsberry DANIEL

## 2015-06-10 ENCOUNTER — Encounter (HOSPITAL_BASED_OUTPATIENT_CLINIC_OR_DEPARTMENT_OTHER): Payer: Self-pay | Admitting: Urology

## 2015-06-10 NOTE — Op Note (Signed)
NAME:  [name], Oscar Smith      ACCOUNT NO.:  [id number]  MEDICAL RECORD NO.:  [id number]  LOCATION:                                 FACILITY:  PHYSICIAN:  Alexis Frock, MD     DATE OF BIRTH:  08-11-1945  DATE OF PROCEDURE: 06/09/2015                              OPERATIVE REPORT  DIAGNOSIS:  Phimosis.  PROCEDURES: 1. Circumcision. 2. Penile block.  ESTIMATED BLOOD LOSS:  Nil.  COMPLICATION:  None.  SPECIMEN:  Redundant, phimotic foreskin for permanent pathology.  FINDINGS: 1. Significant phimotic ring unable to retract foreskin, pre-     circumcision. 2. Complete resolution of obstructing phimotic ring, post-     circumcision.  INDICATION:  Mr. [name] is a very pleasant 69 year old gentleman with history of prostate cancer that has been well controlled.  He has developed bothersome phimosis with inability to retract his foreskin with occasional balanitis, this is becoming painful and interfering with hygiene.  Options were discussed including observation and medical therapy with topical corticosteroid versus surgical intervention with dorsal slit versus formal circumcision.  He wished to proceed with the latter.  Informed consent was obtained and placed in the medical record.  PROCEDURE IN DETAIL:  The patient being Cavani Debruyn, was verified.  Procedure being circumcision was confirmed.  Procedure was carried out.  Time-out was performed.  Intravenous antibiotics were administered.  General LMA anesthesia was introduced.  The patient was placed into a supine position, sterile field was created by prepping and draping the patient's penis, perineum, and proximal thighs in the lower abdomen using iodinated prep.  Sterile field was created using sterile towels and disposal drape.  A dorsal slit was performed by placing a straight hemostat at the 12 o'clock position with phimotic ring and releasing this with cold knife.  This was then allowed reduction of  the phimosis.  Foreskin was laid into anatomic position and mucosal collar was marked approximately 7 mm proximal to the corona of the glans and proximal collar also marked and two circumferential incisions were made along these two sites, these were connected in the dorsum and this redundant foreskin was removed from underlying tissue using cautery dissection and the redundant foreskin was set aside for permanent pathology, labeled as such.  Additional electrocautery was applied, resulted in excellent hemostasis and interrupted sutures were placed at the 6 o'clock and 12 o'clock position respectively and the next two running suture lines of 4-0 Vicryl were used to reapproximate the skin edges.  This resulted in excellent hemostasis and cosmesis.  Dressing of Xeroform, gauze dressing and very lightly compressive Coban was applied. Just prior to incision, penile block was also performed.  Penile block was performed by injecting 10 mL of 0.25% plain Marcaine in a ring block-type fashion circumferentially at the base of the penis. Procedure was then terminated.  The patient tolerated the procedure well.  There were no immediate periprocedural complications.  The patient was taken to the postanesthesia care unit in stable condition.          ______________________________ Alexis Frock, MD     TM/MEDQ  D:  06/09/2015  T:  06/10/2015  Job:  [id number]

## 2015-06-16 DIAGNOSIS — N481 Balanitis: Secondary | ICD-10-CM | POA: Diagnosis not present

## 2015-06-16 DIAGNOSIS — Z0000 Encounter for general adult medical examination without abnormal findings: Secondary | ICD-10-CM | POA: Diagnosis not present

## 2015-06-22 ENCOUNTER — Encounter: Payer: Self-pay | Admitting: Family Medicine

## 2015-06-22 ENCOUNTER — Ambulatory Visit (INDEPENDENT_AMBULATORY_CARE_PROVIDER_SITE_OTHER): Payer: Medicare Other | Admitting: Family Medicine

## 2015-06-22 VITALS — BP 150/77 | HR 87 | Temp 98.4°F | Resp 16 | Ht 69.5 in | Wt 185.4 lb

## 2015-06-22 DIAGNOSIS — N400 Enlarged prostate without lower urinary tract symptoms: Secondary | ICD-10-CM

## 2015-06-22 DIAGNOSIS — Z1159 Encounter for screening for other viral diseases: Secondary | ICD-10-CM | POA: Diagnosis not present

## 2015-06-22 DIAGNOSIS — K219 Gastro-esophageal reflux disease without esophagitis: Secondary | ICD-10-CM

## 2015-06-22 DIAGNOSIS — R0681 Apnea, not elsewhere classified: Secondary | ICD-10-CM

## 2015-06-22 DIAGNOSIS — N5203 Combined arterial insufficiency and corporo-venous occlusive erectile dysfunction: Secondary | ICD-10-CM

## 2015-06-22 DIAGNOSIS — Z23 Encounter for immunization: Secondary | ICD-10-CM | POA: Diagnosis not present

## 2015-06-22 DIAGNOSIS — C61 Malignant neoplasm of prostate: Secondary | ICD-10-CM | POA: Diagnosis not present

## 2015-06-22 DIAGNOSIS — R338 Other retention of urine: Secondary | ICD-10-CM

## 2015-06-22 DIAGNOSIS — N401 Enlarged prostate with lower urinary tract symptoms: Secondary | ICD-10-CM

## 2015-06-22 DIAGNOSIS — E7800 Pure hypercholesterolemia, unspecified: Secondary | ICD-10-CM

## 2015-06-22 DIAGNOSIS — J3089 Other allergic rhinitis: Secondary | ICD-10-CM | POA: Diagnosis not present

## 2015-06-22 DIAGNOSIS — Z0000 Encounter for general adult medical examination without abnormal findings: Secondary | ICD-10-CM

## 2015-06-22 DIAGNOSIS — R339 Retention of urine, unspecified: Secondary | ICD-10-CM | POA: Diagnosis not present

## 2015-06-22 DIAGNOSIS — Z79899 Other long term (current) drug therapy: Secondary | ICD-10-CM | POA: Diagnosis not present

## 2015-06-22 LAB — CBC WITH DIFFERENTIAL/PLATELET
Basophils Absolute: 61 cells/uL (ref 0–200)
Basophils Relative: 1 %
Eosinophils Absolute: 122 cells/uL (ref 15–500)
Eosinophils Relative: 2 %
HCT: 43.8 % (ref 38.5–50.0)
Hemoglobin: 14.8 g/dL (ref 13.2–17.1)
Lymphocytes Relative: 25 %
Lymphs Abs: 1525 cells/uL (ref 850–3900)
MCH: 31.4 pg (ref 27.0–33.0)
MCHC: 33.8 g/dL (ref 32.0–36.0)
MCV: 92.8 fL (ref 80.0–100.0)
MPV: 9.8 fL (ref 7.5–12.5)
Monocytes Absolute: 488 cells/uL (ref 200–950)
Monocytes Relative: 8 %
Neutro Abs: 3904 cells/uL (ref 1500–7800)
Neutrophils Relative %: 64 %
Platelets: 227 10*3/uL (ref 140–400)
RBC: 4.72 MIL/uL (ref 4.20–5.80)
RDW: 13.3 % (ref 11.0–15.0)
WBC: 6.1 10*3/uL (ref 3.8–10.8)

## 2015-06-22 LAB — COMPREHENSIVE METABOLIC PANEL
ALT: 20 U/L (ref 9–46)
AST: 17 U/L (ref 10–35)
Albumin: 4.1 g/dL (ref 3.6–5.1)
Alkaline Phosphatase: 84 U/L (ref 40–115)
BUN: 13 mg/dL (ref 7–25)
CO2: 28 mmol/L (ref 20–31)
Calcium: 9.1 mg/dL (ref 8.6–10.3)
Chloride: 102 mmol/L (ref 98–110)
Creat: 0.89 mg/dL (ref 0.70–1.18)
Glucose, Bld: 85 mg/dL (ref 65–99)
Potassium: 4 mmol/L (ref 3.5–5.3)
Sodium: 137 mmol/L (ref 135–146)
Total Bilirubin: 0.7 mg/dL (ref 0.2–1.2)
Total Protein: 6.7 g/dL (ref 6.1–8.1)

## 2015-06-22 LAB — LIPID PANEL
Cholesterol: 221 mg/dL — ABNORMAL HIGH (ref 125–200)
HDL: 43 mg/dL (ref >=40)
LDL Cholesterol: 157 mg/dL — ABNORMAL HIGH (ref <130)
Total CHOL/HDL Ratio: 5.1 Ratio — ABNORMAL HIGH (ref <=5.0)
Triglycerides: 107 mg/dL (ref <150)
VLDL: 21 mg/dL (ref <30)

## 2015-06-22 NOTE — Progress Notes (Signed)
Subjective:    Patient ID: Oscar Smith, male    DOB: July 16, 1945, 70 y.o.   MRN: [id number]  06/22/2015  Annual Exam   HPI This 70 y.o. male presents for Annual Wellness Examination.  Last physical: 2016 Colonoscopy:  2013; repeat 5 years; [facility] Collene Mares. TDAP:  2015 Pneumovax:  2010 Zostavax: 2008 Influenza: thinks received 2016 Eye exam:  +glasses; recent evaluation Dental exam:  Every six months.  L bicep tear: s/p surgery this year.  Full ROM.  Intermittently, at site of muscle attachment; slept on shoulder early; feels something intermittently with certain movements; holding IPAD and carrying object, feels something along bicep/tricep region.  Painful slightly; no follow-up with ortho.  Only happens the last couple of weeks. October 2016.    Prostate cancer: s/p radiation therapy; urology follows closely every 6 months.  Penile issues: brother with penile cancer; s/p circumcision recently ten days ago; has urinary incontinence; difficulty with retracting foreskin.  Had blisters after surgery and rough skin; went to urology department evaluated by NP.  Recommended monitoring for next month.  Every morning, circumcision seems good; during the day, sees spots and feels discomfort on inside.    Plantar fasciitis: followed by podiatry. Thinks something is wrong with toe first.    Apnea/hypersomnolence:  Chronic.  Enduring it.  Wife is getting fed up.    Review of Systems  Constitutional: Negative for fever, chills, diaphoresis, activity change, appetite change, fatigue and unexpected weight change.  HENT: Negative for congestion, dental problem, drooling, ear discharge, ear pain, facial swelling, hearing loss, mouth sores, nosebleeds, postnasal drip, rhinorrhea, sinus pressure, sneezing, sore throat, tinnitus, trouble swallowing and voice change.   Eyes: Negative for photophobia, pain, discharge, redness, itching and visual disturbance.  Respiratory: Negative for apnea,  cough, choking, chest tightness, shortness of breath, wheezing and stridor.   Cardiovascular: Negative for chest pain, palpitations and leg swelling.  Gastrointestinal: Negative for nausea, vomiting, abdominal pain, diarrhea, constipation and blood in stool.  Endocrine: Negative for cold intolerance, heat intolerance, polydipsia, polyphagia and polyuria.  Genitourinary: Positive for penile swelling and penile pain. Negative for dysuria, urgency, frequency, hematuria, flank pain, decreased urine volume, discharge, scrotal swelling, enuresis, difficulty urinating, genital sores and testicular pain.  Musculoskeletal: Positive for arthralgias and gait problem. Negative for myalgias, back pain, joint swelling, neck pain and neck stiffness.  Skin: Negative for color change, pallor, rash and wound.  Allergic/Immunologic: Negative for environmental allergies, food allergies and immunocompromised state.  Neurological: Negative for dizziness, tremors, seizures, syncope, facial asymmetry, speech difficulty, weakness, light-headedness, numbness and headaches.  Hematological: Negative for adenopathy. Does not bruise/bleed easily.  Psychiatric/Behavioral: Negative for suicidal ideas, hallucinations, behavioral problems, confusion, sleep disturbance, self-injury, dysphoric mood, decreased concentration and agitation. The patient is not nervous/anxious and is not hyperactive.     Past Medical History  Diagnosis Date  . GERD (gastroesophageal reflux disease)   . Hiatal hernia   . History of colon polyps     2002 and 2003 hyperplastic  . ED (erectile dysfunction) of organic origin   . BPH (benign prostatic hyperplasia)   . Lower urinary tract symptoms (LUTS)   . Wears hearing aid     bilateral  . History of gastritis   . Phimosis   . Adenocarcinoma of prostate [facility]) urologist-  dr Tresa Moore--  dx Nov 2011--  T1c, Gleason 3+3,  PSA 4.2 (post IMRT 03/2010    Low risk recurrence, Biochemical control,  last PSA  0.30 on 05-28-2014  Past Surgical History  Procedure Laterality Date  . Abdominal hernia repair    . Prostate surgery  2009  . Colonoscopy  last one 11-15-2011  . Inguinal hernia repair Right 12-23-2009  . Shoulder arthroscopy with subacromial decompression, rotator cuff repair and bicep tendon repair Left 10-2014  . Circumcision N/A 06/09/2015    Procedure: CIRCUMCISION ADULT WITH PENILE BLOCK;  Surgeon: Alexis Frock, MD;  Location: [facility];  Service: Urology;  Laterality: N/A;   No Known Allergies Current Outpatient Prescriptions  Medication Sig Dispense Refill  . fluticasone (FLONASE) 50 MCG/ACT nasal spray Place 2 sprays into both nostrils daily. 16 g 6  . Multiple Vitamin (MULTIVITAMIN) tablet Take 1 tablet by mouth daily.    Marland Kitchen omeprazole (PRILOSEC) 40 MG capsule Take 1 capsule (40 mg total) by mouth daily. 30 capsule 11  . OVER THE COUNTER MEDICATION OTC Ultraflora taking prn for IB     No current facility-administered medications for this visit.   Social History   Social History  . Marital Status: Married    Spouse Name: N/A  . Number of Children: 0  . Years of Education: BS   Occupational History  . SCULPTOR    Social History Main Topics  . Smoking status: Former Smoker    Types: Cigarettes    Quit date: 09/04/1965  . Smokeless tobacco: Never Used     Comment: quit around 70 yrs old.  . Alcohol Use: Yes     Comment: 4/day  . Drug Use: No  . Sexual Activity:    Partners: Male   Other Topics Concern  . Not on file   Social History Narrative   Trained as an Chief Financial Officer. Married '75- 70 years, divorced; Married '86. No children. 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 Sculptor-life size figures by commission (see work in [employer] park.) Learning the piano. Patient get regular exercise.      Caffeine: 3/day          Family History  Problem Relation Age of Onset  . Stomach  cancer Father   . Cancer Father 52    stomach versus colon cancer  . Pancreatic cancer Sister   . Cancer Sister 51    pancreatic cancer  . Cancer Brother     penile cancer       Objective:    BP 150/77 mmHg  Pulse 87  Temp(Src) 98.4 F (36.9 C) (Oral)  Resp 16  Ht 5' 9.5" (1.765 m)  Wt 185 lb 6.4 oz (84.097 kg)  BMI 27.00 kg/m2  SpO2 97% Physical Exam  Constitutional: He is oriented to person, place, and time. He appears well-developed and well-nourished. No distress.  HENT:  Head: Normocephalic and atraumatic.  Right Ear: External ear normal.  Left Ear: External ear normal.  Nose: Nose normal.  Mouth/Throat: Oropharynx is clear and moist.  Eyes: Conjunctivae and EOM are normal. Pupils are equal, round, and reactive to light.  Neck: Normal range of motion. Neck supple. Carotid bruit is not present. No thyromegaly present.  Cardiovascular: Normal rate, regular rhythm, normal heart sounds and intact distal pulses.  Exam reveals no gallop and no friction rub.   No murmur heard. Pulmonary/Chest: Effort normal and breath sounds normal. He has no wheezes. He has no rales.  Abdominal: Soft. Bowel sounds are normal. He exhibits no distension and no mass. There is no tenderness. There is no rebound and no guarding.  Genitourinary:  Well healed incision distal penis; scant drainage along incision; +persistent swelling mild.  Minimal erythema.  Musculoskeletal:       Right shoulder: Normal.       Left shoulder: Normal.       Cervical back: Normal.  Lymphadenopathy:    He has no cervical adenopathy.  Neurological: He is alert and oriented to person, place, and time. He has normal reflexes. No cranial nerve deficit. He exhibits normal muscle tone. Coordination normal.  Skin: Skin is warm and dry. No rash noted. He is not diaphoretic.  Psychiatric: He has a normal mood and affect. His behavior is normal. Judgment and thought content normal.   Depression screen [facility] 2/9 06/22/2015  10/03/2014 05/28/2014 01/08/2014  Decreased Interest 0 0 0 0  Down, Depressed, Hopeless 0 0 0 0  PHQ - 2 Score 0 0 0 0    Fall Risk  06/22/2015 05/28/2014 01/08/2014  Falls in the past year? No No No          Assessment & Plan:   1. Encounter for Medicare annual wellness exam   2. Other allergic rhinitis   3. Combined arterial insufficiency and corporo-venous occlusive erectile dysfunction   4. Enlarged prostate with urinary retention   5. Incomplete bladder emptying   6. ADENOCARCINOMA, PROSTATE, GLEASON GRADE 6   7. Gastroesophageal reflux disease without esophagitis   8. Pure hypercholesterolemia   9. Need for hepatitis C screening test   10. Apnea     Orders Placed This Encounter  Procedures  . Pneumococcal conjugate vaccine 13-valent IM  . CBC with Differential/Platelet  . Comprehensive metabolic panel    Order Specific Question:  Has the patient fasted?    Answer:  Yes  . Lipid panel    Order Specific Question:  Has the patient fasted?    Answer:  Yes  . Hepatitis C antibody  . Ambulatory referral to Sleep Studies    Referral Priority:  Routine    Referral Type:  Consultation    Referral Reason:  Specialty Services Required    Number of Visits Requested:  1   No orders of the defined types were placed in this encounter.    No Follow-up on file.    Ferrel Simington Elayne Guerin, M.D. [facility] [address] [place], [place]  [address] [phone] phone [phone] fax

## 2015-06-22 NOTE — Patient Instructions (Addendum)
   IF you received an x-ray today, you will receive an invoice from [facility]. Please contact [facility] at [phone] with questions or concerns regarding your invoice.   IF you received labwork today, you will receive an invoice from [facility]. Please contact [facility] at [phone] with questions or concerns regarding your invoice.   Our billing staff will not be able to assist you with questions regarding bills from these companies.  You will be contacted with the lab results as soon as they are available. The fastest way to get your results is to activate your My Chart account. Instructions are located on the last page of this paperwork. If you have not heard from us regarding the results in 2 weeks, please contact this office.    Keeping you healthy  Get these tests  Blood pressure- Have your blood pressure checked once a year by your healthcare provider.  Normal blood pressure is 120/80  Weight- Have your body mass index (BMI) calculated to screen for obesity.  BMI is a measure of body fat based on height and weight. You can also calculate your own BMI at [email or web address]/bmi/.  Cholesterol- Have your cholesterol checked every year.  Diabetes- Have your blood sugar checked regularly if you have high blood pressure, high cholesterol, have a family history of diabetes or if you are overweight.  Screening for Colon Cancer- Colonoscopy starting at age 50.  Screening may begin sooner depending on your family history and other health conditions. Follow up colonoscopy as directed by your Gastroenterologist.  Screening for Prostate Cancer- Both blood work (PSA) and a rectal exam help screen for Prostate Cancer.  Screening begins at age 40 with African-American men and at age 50 with Caucasian men.  Screening may begin sooner depending on your family history.  Take these medicines  Aspirin- One aspirin daily can help prevent Heart  disease and Stroke.  Flu shot- Every fall.  Tetanus- Every 10 years.  Zostavax- Once after the age of 60 to prevent Shingles.  Pneumonia shot- Once after the age of 65; if you are younger than 65, ask your healthcare provider if you need a Pneumonia shot.  Take these steps  Don't smoke- If you do smoke, talk to your doctor about quitting.  For tips on how to quit, go to www.smokefree.gov or call 1-800-QUIT-NOW.  Be physically active- Exercise 5 days a week for at least 30 minutes.  If you are not already physically active start slow and gradually work up to 30 minutes of moderate physical activity.  Examples of moderate activity include walking briskly, mowing the yard, dancing, swimming, bicycling, etc.  Eat a healthy diet- Eat a variety of healthy food such as fruits, vegetables, low fat milk, low fat cheese, yogurt, lean meant, poultry, fish, beans, tofu, etc. For more information go to www.thenutritionsource.org  Drink alcohol in moderation- Limit alcohol intake to less than two drinks a day. Never drink and drive.  Dentist- Brush and floss twice daily; visit your dentist twice a year.  Depression- Your emotional health is as important as your physical health. If you're feeling down, or losing interest in things you would normally enjoy please talk to your healthcare provider.  Eye exam- Visit your eye doctor every year.  Safe sex- If you may be exposed to a sexually transmitted infection, use a condom.  Seat belts- Seat belts can save your life; always wear one.  Smoke/Carbon Monoxide detectors- These detectors need to be installed on   the appropriate level of your home.  Replace batteries at least once a year.  Skin cancer- When out in the sun, cover up and use sunscreen 15 SPF or higher.  Violence- If anyone is threatening you, please tell your healthcare provider.  Living Will/ Health care power of attorney- Speak with your healthcare provider and family. 

## 2015-06-23 LAB — HEPATITIS C ANTIBODY: HCV Ab: NEGATIVE

## 2015-07-07 ENCOUNTER — Encounter: Payer: Self-pay | Admitting: Neurology

## 2015-07-07 ENCOUNTER — Institutional Professional Consult (permissible substitution): Payer: Medicare Other | Admitting: Neurology

## 2015-07-07 ENCOUNTER — Ambulatory Visit (INDEPENDENT_AMBULATORY_CARE_PROVIDER_SITE_OTHER): Payer: Medicare Other | Admitting: Neurology

## 2015-07-07 VITALS — BP 168/92 | HR 80 | Resp 16 | Ht 69.5 in | Wt 185.0 lb

## 2015-07-07 DIAGNOSIS — G2581 Restless legs syndrome: Secondary | ICD-10-CM

## 2015-07-07 DIAGNOSIS — R0681 Apnea, not elsewhere classified: Secondary | ICD-10-CM | POA: Diagnosis not present

## 2015-07-07 DIAGNOSIS — E663 Overweight: Secondary | ICD-10-CM

## 2015-07-07 DIAGNOSIS — R0683 Snoring: Secondary | ICD-10-CM | POA: Diagnosis not present

## 2015-07-07 DIAGNOSIS — R351 Nocturia: Secondary | ICD-10-CM | POA: Diagnosis not present

## 2015-07-07 DIAGNOSIS — G4710 Hypersomnia, unspecified: Secondary | ICD-10-CM | POA: Diagnosis not present

## 2015-07-07 NOTE — Patient Instructions (Signed)

## 2015-07-07 NOTE — Progress Notes (Signed)
Subjective:    Patient ID: Oscar Smith is a 70 y.o. male.  HPI     Star Age, MD, PhD [facility] [address], [address] [place], [place] [address]  Dear Dr. Tamala Julian,   I saw your patient, Oscar Smith, upon your kind request, in my neurologic clinic today for initial consultation of his sleep disorder, in particular, concern for underlying obstructive sleep apnea. The patient is unaccompanied today. As you know, Oscar Smith is a 70 year old right-handed gentleman with an underlying medical history of prostate cancer, plantar fasciitis, history of left biceps surgery secondary to tear, reflux disease and hiatal hernia, ED, BPH, hearing loss with bilateral hearing aids, and overweight state, who reports snoring and excessive daytime somnolence as well as witnessed breathing pauses while asleep. He and his wife does not typically sleep in the same bedroom. His cat sleeps on his back. Wakes him up around 2 or 3 AM often and then he has trouble going back to sleep. Typically bedtime is between 10 and 10:30 PM and he falls asleep quickly, does not watch TV in bed. Wakeup time is between 5 and 6 AM. He currently is self-employed and works as a Development worker, community. He has nocturia 1, has occasional restless leg symptoms and is not sure if he twitches his legs in his sleep but is a restless sleeper. During the colonoscopy some years ago he was told that he had drops in his oxygen level and apneas, needed to be checked for sleep apnea. He has delayed evaluation for sleep apnea as he thinks he will not be able to tolerate CPAP. I reviewed your office note from 06/22/2015. He is a retired Chief Financial Officer. He was married twice, lives with his second wife. Has no children. He quit smoking many years ago, in 1967, drinks alcohol on a daily basis, usually in the form of wine and/or beer, on average 2 glasses of wine and 2 beer per day. He drinks caffeine in the form  of coffee, 3 cups a day. His Epworth sleepiness score is 9 out of 24, his fatigue score is 27 out of 63. He does not often wake up well rested. He denies morning headaches, is not aware of any family history of OSA. Family is in [place].   His Past Medical History Is Significant For: Past Medical History  Diagnosis Date  . GERD (gastroesophageal reflux disease)   . Hiatal hernia   . History of colon polyps     2002 and 2003 hyperplastic  . ED (erectile dysfunction) of organic origin   . BPH (benign prostatic hyperplasia)   . Lower urinary tract symptoms (LUTS)   . Wears hearing aid     bilateral  . History of gastritis   . Phimosis   . Adenocarcinoma of prostate [facility]) urologist-  dr Tresa Moore--  dx Nov 2011--  T1c, Gleason 3+3,  PSA 4.2 (post IMRT 03/2010    Low risk recurrence, Biochemical control,  last PSA 0.30 on 05-28-2014    His Past Surgical History Is Significant For: Past Surgical History  Procedure Laterality Date  . Abdominal hernia repair    . Prostate surgery  2009  . Colonoscopy  last one 11-15-2011  . Inguinal hernia repair Right 12-23-2009  . Shoulder arthroscopy with subacromial decompression, rotator cuff repair and bicep tendon repair Left 10-2014  . Circumcision N/A 06/09/2015    Procedure: CIRCUMCISION ADULT WITH PENILE BLOCK;  Surgeon: Alexis Frock, MD;  Location: [place];  Service: Urology;  Laterality: N/A;    His Family History Is Significant For: Family History  Problem Relation Age of Onset  . Stomach cancer Father   . Cancer Father 1    stomach versus colon cancer  . Pancreatic cancer Sister   . Cancer Sister 19    pancreatic cancer  . Cancer Brother     penile cancer    His Social History Is Significant For: Social History   Social History  . Marital Status: Married    Spouse Name: N/A  . Number of Children: 0  . Years of Education: BS   Occupational History  . SCULPTOR    Social History Main Topics  . Smoking  status: Former Smoker    Types: Cigarettes    Quit date: 09/04/1965  . Smokeless tobacco: Never Used     Comment: quit around 70 yrs old.  . Alcohol Use: Yes     Comment: 4/day  . Drug Use: No  . Sexual Activity:    Partners: Male   Other Topics Concern  . None   Social History Narrative   Trained as an Chief Financial Officer. Married '75- 4 years, divorced; Married '86. No children. 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 Sculptor-life size figures by commission (see work in [employer] park.) Learning the piano. Patient get regular exercise.      Caffeine: 3/day           His Allergies Are:  No Known Allergies:   His Current Medications Are:  Outpatient Encounter Prescriptions as of 07/07/2015  Medication Sig  . fluticasone (FLONASE) 50 MCG/ACT nasal spray Place 2 sprays into both nostrils daily.  . Multiple Vitamin (MULTIVITAMIN) tablet Take 1 tablet by mouth daily.  Marland Kitchen omeprazole (PRILOSEC) 40 MG capsule Take 1 capsule (40 mg total) by mouth daily.  Marland Kitchen OVER THE COUNTER MEDICATION OTC Ultraflora taking prn for IB  . [DISCONTINUED] oxyCODONE-acetaminophen (ROXICET) 5-325 MG tablet Take 1 tablet by mouth every 4 (four) hours as needed for moderate pain or severe pain. Post-operaviely (Patient not taking: Reported on 06/22/2015)  . [DISCONTINUED] senna-docusate (SENOKOT-S) 8.6-50 MG tablet Take 1 tablet by mouth 2 (two) times daily. While taking pain meds to prevent constipation (Patient not taking: Reported on 06/22/2015)  . [DISCONTINUED] sildenafil (VIAGRA) 100 MG tablet Take 0.5-1 tablets (50-100 mg total) by mouth daily as needed for erectile dysfunction.  . [DISCONTINUED] sulfamethoxazole-trimethoprim (BACTRIM DS,SEPTRA DS) 800-160 MG tablet Take 1 tablet by mouth 2 (two) times daily. X 5 days to prevent post-op infection (Patient not taking: Reported on 06/22/2015)   No facility-administered encounter medications on file as of  07/07/2015.  :  Review of Systems:  Out of a complete 14 point review of systems, all are reviewed and negative with the exception of these symptoms as listed below:   Review of Systems  Neurological:       Snoring, witnessed apnea, sometimes wakes up feeling tired, denies taking naps.    Epworth Sleepiness Scale 0= would never doze 1= slight chance of dozing 2= moderate chance of dozing 3= high chance of dozing  Sitting and reading:1 Watching TV:1 Sitting inactive in a public place (ex. Theater or meeting):1 As a passenger in a car for an hour without a break:2  Lying down to rest in the afternoon:1 Sitting and talking to someone:1 Sitting quietly after lunch (no alcohol):1 In a car, while stopped in traffic:1  Total:9   Objective:  Neurologic Exam  Physical Exam Physical Examination:   Filed Vitals:   07/07/15 0855  BP: 168/92  Pulse: 80  Resp: 16   General Examination: The patient is a very pleasant 70 y.o. male in no acute distress. He appears well-developed and well-nourished and well groomed. He has a beard.  HEENT: Normocephalic, atraumatic, pupils are equal, round and reactive to light and accommodation. Funduscopic exam is normal with sharp disc margins noted. Extraocular tracking is good without limitation to gaze excursion or nystagmus noted. Normal smooth pursuit is noted. Hearing is mildly impaired, has bilateral hearing aids in place. Face is symmetric with normal facial animation and normal facial sensation. Speech is clear with no dysarthria noted. There is no hypophonia. There is no lip, neck/head, jaw or voice tremor. Neck is supple with full range of passive and active motion. There are no carotid bruits on auscultation. Oropharynx exam reveals: mild mouth dryness, adequate dental hygiene and mild airway crowding, due to redundant soft palate, tonsils are either small or absent. Mallampati is class II. Tongue protrudes centrally and palate elevates  symmetrically. Neck size is 15.75 inches. He has a Absent overbite. Nasal inspection reveals no significant nasal mucosal bogginess or redness but he does have septal deviation to the left in the front.    Chest: Clear to auscultation without wheezing, rhonchi or crackles noted.  Heart: S1+S2+0, regular and normal without murmurs, rubs or gallops noted.   Abdomen: Soft, non-tender and non-distended with normal bowel sounds appreciated on auscultation.  Extremities: There is no pitting edema in the distal lower extremities bilaterally. Pedal pulses are intact.  Skin: Warm and dry without trophic changes noted. There are  significant varicose veins in the distal left leg.  Musculoskeletal: exam reveals no obvious joint deformities, tenderness or joint swelling or erythema.   Neurologically:  Mental status: The patient is awake, alert and oriented in all 4 spheres. His immediate and remote memory, attention, language skills and fund of knowledge are appropriate. There is no evidence of aphasia, agnosia, apraxia or anomia. Speech is clear with normal prosody and enunciation. Thought process is linear. Mood is normal and affect is normal.  Cranial nerves II - XII are as described above under HEENT exam. In addition: shoulder shrug is normal with equal shoulder height noted. Motor exam: Normal bulk, strength and tone is noted. There is no drift, tremor or rebound. Romberg is negative, except for mild sway. Reflexes are 2+ throughout. Fine motor skills and coordination: intact with normal finger taps, normal hand movements, normal rapid alternating patting, normal foot taps and normal foot agility.  Cerebellar testing: No dysmetria or intention tremor on finger to nose testing. Heel to shin is unremarkable bilaterally. There is no truncal or gait ataxia.  Sensory exam: intact to light touch, pinprick, vibration, temperature sense in the upper and lower extremities.  Gait, station and balance: He stands  easily. No veering to one side is noted. No leaning to one side is noted. Posture is age-appropriate and stance is narrow based. Gait shows normal stride length and normal pace. No problems turning are noted. He turns en bloc. Tandem walk is unremarkable.   Assessment and Plan:   In summary, Oscar Smith is a very pleasant 70 y.o.-year old male with an underlying medical history of prostate cancer, plantar fasciitis, history of left biceps surgery secondary to tear, reflux disease and hiatal hernia, ED, BPH, hearing loss with bilateral hearing aids, and overweight  state, whose history and physical exam are in keeping with obstructive sleep apnea (OSA). In addition he endorses mild restless leg symptoms. I had a long chat with the patient about my findings and the diagnosis of OSA, its prognosis and treatment options. We talked about medical treatments, surgical interventions and non-pharmacological approaches. I explained in particular the risks and ramifications of untreated moderate to severe OSA, especially with respect to developing cardiovascular disease down the Road, including congestive heart failure, difficult to treat hypertension, cardiac arrhythmias, or stroke. Even type 2 diabetes has, in part, been linked to untreated OSA. Symptoms of untreated OSA include daytime sleepiness, memory problems, mood irritability and mood disorder such as depression and anxiety, lack of energy, as well as recurrent headaches, especially morning headaches. We talked about trying to maintain a healthy lifestyle in general, as well as the importance of weight control. I encouraged the patient to eat healthy, exercise daily and keep well hydrated, to keep a scheduled bedtime and wake time routine, to not skip any meals and eat healthy snacks in between meals. I advised the patient not to drive when feeling sleepy. He is encouraged to consider reducing his alcohol intake, but is not really motivated to do so  ("my only pleasure"). I recommended the following at this time: sleep study with potential positive airway pressure titration. (We will score hypopneas at 4% and split the sleep study into diagnostic and treatment portion, if the estimated. 2 hour AHI is >15/h).   I explained the sleep test procedure to the patient and also outlined possible surgical and non-surgical treatment options of OSA, including the use of a custom-made dental device (which would require a referral to a specialist dentist or oral surgeon), upper airway surgical options, such as pillar implants, radiofrequency surgery, tongue base surgery, and UPPP (which would involve a referral to an ENT surgeon). Rarely, jaw surgery such as mandibular advancement may be considered.  I also explained the CPAP treatment option to the patient, who indicated that he would be willing to try CPAP if the need arises. I explained the importance of being compliant with PAP treatment, not only for insurance purposes but primarily to improve His symptoms, and for the patient's long term health benefit, including to reduce His cardiovascular risks. I answered all his questions today and the patient was in agreement. I would like to see him back after the sleep study is completed and encouraged him to call with any interim questions, concerns, problems or updates.   Thank you very much for allowing me to participate in the care of this nice patient. If I can be of any further assistance to you please do not hesitate to call me at [phone].  Sincerely,   Star Age, MD, PhD

## 2015-07-13 ENCOUNTER — Ambulatory Visit: Payer: Medicare Other | Admitting: Family Medicine

## 2015-07-16 ENCOUNTER — Ambulatory Visit (INDEPENDENT_AMBULATORY_CARE_PROVIDER_SITE_OTHER): Payer: Medicare Other | Admitting: Neurology

## 2015-07-16 DIAGNOSIS — G4720 Circadian rhythm sleep disorder, unspecified type: Secondary | ICD-10-CM

## 2015-07-16 DIAGNOSIS — G4761 Periodic limb movement disorder: Secondary | ICD-10-CM

## 2015-07-16 DIAGNOSIS — G4710 Hypersomnia, unspecified: Secondary | ICD-10-CM

## 2015-07-20 ENCOUNTER — Telehealth: Payer: Self-pay | Admitting: Neurology

## 2015-07-20 DIAGNOSIS — N481 Balanitis: Secondary | ICD-10-CM | POA: Diagnosis not present

## 2015-07-20 NOTE — Telephone Encounter (Signed)
Patient referred by Dr. Tamala Julian, seen by me on 07/07/15, diagnostic PSG on 07/16/15.   Please call and notify the patient that the recent sleep study did not show any significant obstructive sleep apnea. BUT: he slept very little and had some leg twitching in sleep, called PLMs. Please inform patient that I would like to go over the details of the study during a follow up appointment. Arrange a followup appointment. Also, route or fax report to PCP and referring MD, if other than PCP.  Once you have spoken to patient, you can close this encounter.   Thanks,  Star Age, MD, PhD [facility])

## 2015-07-20 NOTE — Telephone Encounter (Signed)
LM with results of sleep study on vm (per DPR). I asked patient to call back and make f/u appt with Dr. Rexene Alberts to discuss further. I will send copy of report to PCP.

## 2015-07-28 DIAGNOSIS — M722 Plantar fascial fibromatosis: Secondary | ICD-10-CM | POA: Diagnosis not present

## 2015-07-28 DIAGNOSIS — M9904 Segmental and somatic dysfunction of sacral region: Secondary | ICD-10-CM | POA: Diagnosis not present

## 2015-07-28 DIAGNOSIS — M4126 Other idiopathic scoliosis, lumbar region: Secondary | ICD-10-CM | POA: Diagnosis not present

## 2015-07-28 DIAGNOSIS — M9903 Segmental and somatic dysfunction of lumbar region: Secondary | ICD-10-CM | POA: Diagnosis not present

## 2015-07-28 DIAGNOSIS — M5137 Other intervertebral disc degeneration, lumbosacral region: Secondary | ICD-10-CM | POA: Diagnosis not present

## 2015-07-28 DIAGNOSIS — Q72812 Congenital shortening of left lower limb: Secondary | ICD-10-CM | POA: Diagnosis not present

## 2015-07-28 DIAGNOSIS — M9905 Segmental and somatic dysfunction of pelvic region: Secondary | ICD-10-CM | POA: Diagnosis not present

## 2015-07-28 DIAGNOSIS — M79671 Pain in right foot: Secondary | ICD-10-CM | POA: Diagnosis not present

## 2015-09-11 ENCOUNTER — Ambulatory Visit (INDEPENDENT_AMBULATORY_CARE_PROVIDER_SITE_OTHER): Payer: Medicare Other | Admitting: Physician Assistant

## 2015-09-11 VITALS — BP 140/80 | HR 78 | Temp 99.6°F | Resp 18 | Ht 69.5 in | Wt 187.4 lb

## 2015-09-11 DIAGNOSIS — R1032 Left lower quadrant pain: Secondary | ICD-10-CM | POA: Diagnosis not present

## 2015-09-11 LAB — POCT CBC
Granulocyte percent: 76.3 %G (ref 37–80)
HCT, POC: 39.2 % — AB (ref 43.5–53.7)
Hemoglobin: 14.4 g/dL (ref 14.1–18.1)
Lymph, poc: 1.8 (ref 0.6–3.4)
MCH, POC: 32.8 pg — AB (ref 27–31.2)
MCHC: 36.8 g/dL — AB (ref 31.8–35.4)
MCV: 89.1 fL (ref 80–97)
MID (cbc): 0.6 (ref 0–0.9)
MPV: 7 fL (ref 0–99.8)
POC Granulocyte: 7.8 — AB (ref 2–6.9)
POC LYMPH PERCENT: 17.6 %L (ref 10–50)
POC MID %: 6.1 %M (ref 0–12)
Platelet Count, POC: 153 10*3/uL (ref 142–424)
RBC: 4.4 M/uL — AB (ref 4.69–6.13)
RDW, POC: 12.6 %
WBC: 10.2 10*3/uL (ref 4.6–10.2)

## 2015-09-11 MED ORDER — AMOXICILLIN-POT CLAVULANATE 875-125 MG PO TABS: 1.0000 | ORAL_TABLET | Freq: Two times a day (BID) | ORAL | 0 refills | Status: AC

## 2015-09-11 NOTE — Patient Instructions (Addendum)
IF you received an x-ray today, you will receive an invoice from [facility]. Please contact [facility] at [phone] with questions or concerns regarding your invoice.   IF you received labwork today, you will receive an invoice from [employer]. Please contact [facility] at [phone] with questions or concerns regarding your invoice.   Our billing staff will not be able to assist you with questions regarding bills from these companies.  You will be contacted with the lab results as soon as they are available. The fastest way to get your results is to activate your My Chart account. Instructions are located on the last page of this paperwork. If you have not heard from [place] regarding the results in 2 weeks, please contact this office.     Diverticulitis Diverticulitis is inflammation or infection of small pouches in your colon that form when you have a condition called diverticulosis. The pouches in your colon are called diverticula. Your colon, or large intestine, is where water is absorbed and stool is formed. Complications of diverticulitis can include:  Bleeding.  Severe infection.  Severe pain.  Perforation of your colon.  Obstruction of your colon. CAUSES  Diverticulitis is caused by bacteria. Diverticulitis happens when stool becomes trapped in diverticula. This allows bacteria to grow in the diverticula, which can lead to inflammation and infection. RISK FACTORS People with diverticulosis are at risk for diverticulitis. Eating a diet that does not include enough fiber from fruits and vegetables may make diverticulitis more likely to develop. SYMPTOMS  Symptoms of diverticulitis may include:  Abdominal pain and tenderness. The pain is normally located on the left side of the abdomen, but may occur in other areas.  Fever and chills.  Bloating.  Cramping.  Nausea.  Vomiting.  Constipation.  Diarrhea.  Blood in  your stool. DIAGNOSIS  Your health care provider will ask you about your medical history and do a physical exam. You may need to have tests done because many medical conditions can cause the same symptoms as diverticulitis. Tests may include:  Blood tests.  Urine tests.  Imaging tests of the abdomen, including X-rays and CT scans. When your condition is under control, your health care provider may recommend that you have a colonoscopy. A colonoscopy can show how severe your diverticula are and whether something else is causing your symptoms. TREATMENT  Most cases of diverticulitis are mild and can be treated at home. Treatment may include:  Taking over-the-counter pain medicines.  Following a clear liquid diet.  Taking antibiotic medicines by mouth for 7-10 days. More severe cases may be treated at a hospital. Treatment may include:  Not eating or drinking.  Taking prescription pain medicine.  Receiving antibiotic medicines through an IV tube.  Receiving fluids and nutrition through an IV tube.  Surgery. HOME CARE INSTRUCTIONS   Follow your health care provider's instructions carefully.  Follow a full liquid diet or other diet as directed by your health care provider. After your symptoms improve, your health care provider may tell you to change your diet. He or she may recommend you eat a high-fiber diet. Fruits and vegetables are good sources of fiber. Fiber makes it easier to pass stool.  Take fiber supplements or probiotics as directed by your health care provider.  Only take medicines as directed by your health care provider.  Keep all your follow-up appointments. SEEK MEDICAL CARE IF:   Your pain does not improve.  You have a hard time eating  food.  Your bowel movements do not return to normal. SEEK IMMEDIATE MEDICAL CARE IF:   Your pain becomes worse.  Your symptoms do not get better.  Your symptoms suddenly get worse.  You have a fever.  You have  repeated vomiting.  You have bloody or black, tarry stools. MAKE SURE YOU:   Understand these instructions.  Will watch your condition.  Will get help right away if you are not doing well or get worse.   This information is not intended to replace advice given to you by your health care provider. Make sure you discuss any questions you have with your health care provider.   Document Released: 10/19/2004 Document Revised: 01/14/2013 Document Reviewed: 12/04/2012 Elsevier Interactive Patient Education [employer].

## 2015-09-11 NOTE — Progress Notes (Signed)
Patient ID: Oscar Smith, male    DOB: 07/16/1945, 70 y.o.   MRN: [id number]  PCP: Reginia Forts, MD  Subjective:   Chief Complaint  Patient presents with  . Abdominal Pain    lower left side, fatigue. Slight fever. x1 week    HPI Presents for evaluation of possible diverticulitis.  He has had mild intermittent LLQ pain x 1 week. Increased pain with standing up and stretching. No change with eating. Had some constipation (no BM x 3 days) but his wife, a nurse, gave him something for that and he had 3 BMs in one day. No melena or hematochezia.  Yesterday he was feeling tired and "slouchy." His wife checked his temperature and it was 101. When he had a low grade fever today (t 99), she advised him to come in for evaluation for possible diverticulitis.  Previously diagnosed with diverticulitis 12/2013 with similar symptoms. Colonoscopy 2013. Polyps. To repeat in 2018. Treated with Augmentin with complete resolution. History of prostate cancer.   Review of Systems  Constitutional: Positive for fatigue and fever. Negative for appetite change, chills and diaphoresis.  Respiratory: Negative for cough and shortness of breath.   Cardiovascular: Negative for chest pain, palpitations and leg swelling.  Gastrointestinal: Positive for abdominal pain. Negative for abdominal distention, anal bleeding, blood in stool, constipation, diarrhea, nausea, rectal pain and vomiting.  Genitourinary: Negative for dysuria, frequency, hematuria and urgency.  Musculoskeletal: Negative for arthralgias, back pain and myalgias.  Neurological: Negative for dizziness, weakness and headaches.  Hematological: Negative for adenopathy. Does not bruise/bleed easily.       Patient Active Problem List   Diagnosis Date Noted  . Enlarged prostate with urinary retention 12/25/2013  . Erectile dysfunction 12/25/2013  . Incomplete bladder emptying 08/27/2013  . Routine health maintenance 12/25/2010  .  Allergic rhinitis 09/24/2010  . ADENOCARCINOMA, PROSTATE, GLEASON GRADE 6 12/21/2009  . INGUINAL HERNIA, RIGHT 12/21/2009  . GERD 11/12/2008     Prior to Admission medications   Medication Sig Start Date End Date Taking? Authorizing Provider  omeprazole (PRILOSEC) 20 MG capsule Take 20 mg by mouth daily.   Yes Historical Provider, MD  fluticasone (FLONASE) 50 MCG/ACT nasal spray Place 2 sprays into both nostrils daily. Patient not taking: Reported on 09/11/2015 12/04/14   [name], MD  Multiple Vitamin (MULTIVITAMIN) tablet Take 1 tablet by mouth daily.    Historical Provider, MD  OVER THE COUNTER MEDICATION OTC Ultraflora taking prn for IB    Historical Provider, MD     No Known Allergies     Objective:  Physical Exam  Constitutional: He is oriented to person, place, and time. He appears well-developed and well-nourished. He is active and cooperative. No distress.  BP 140/80   Pulse 78   Temp 99.6 F (37.6 C) (Oral)   Resp 18   Ht 5' 9.5" (1.765 m)   Wt 187 lb 6.4 oz (85 kg)   SpO2 98%   BMI 27.28 kg/m   HENT:  Head: Normocephalic and atraumatic.  Right Ear: Hearing normal.  Left Ear: Hearing normal.  Eyes: Conjunctivae are normal. No scleral icterus.  Neck: Normal range of motion. Neck supple. No thyromegaly present.  Cardiovascular: Normal rate, regular rhythm and normal heart sounds.   Pulses:      Radial pulses are 2+ on the right side, and 2+ on the left side.  Pulmonary/Chest: Effort normal and breath sounds normal.  Abdominal: Normal appearance and bowel sounds are normal. He  exhibits no distension and no mass. There is no hepatosplenomegaly. There is tenderness in the suprapubic area and left lower quadrant.    Lymphadenopathy:       Head (right side): No tonsillar, no preauricular, no posterior auricular and no occipital adenopathy present.       Head (left side): No tonsillar, no preauricular, no posterior auricular and no occipital adenopathy present.     He has no cervical adenopathy.       Right: No supraclavicular adenopathy present.       Left: No supraclavicular adenopathy present.  Neurological: He is alert and oriented to person, place, and time. No sensory deficit.  Skin: Skin is warm, dry and intact. No rash noted. No cyanosis or erythema. Nails show no clubbing.  Psychiatric: He has a normal mood and affect. His speech is normal and behavior is normal.    Results for orders placed or performed in visit on 09/11/15  POCT CBC  Result Value Ref Range   WBC 10.2 4.6 - 10.2 K/uL   Lymph, poc 1.8 0.6 - 3.4   POC LYMPH PERCENT 17.6 10 - 50 %L   MID (cbc) 0.6 0 - 0.9   POC MID % 6.1 0 - 12 %M   POC Granulocyte 7.8 (A) 2 - 6.9   Granulocyte percent 76.3 37 - 80 %G   RBC 4.40 (A) 4.69 - 6.13 M/uL   Hemoglobin 14.4 14.1 - 18.1 g/dL   HCT, POC 39.2 (A) 43.5 - 53.7 %   MCV 89.1 80 - 97 fL   MCH, POC 32.8 (A) 27 - 31.2 pg   MCHC 36.8 (A) 31.8 - 35.4 g/dL   RDW, POC 12.6 %   Platelet Count, POC 153 142 - 424 K/uL   MPV 7.0 0 - 99.8 fL          Assessment & Plan:   1. LLQ abdominal pain Suspect diverticulitis. Treat empirically with Augmentin. If symptoms worsen, or persist without significant improvement in 48 hours, RTC. Would order CT scan. - POCT CBC - amoxicillin-clavulanate (AUGMENTIN) 875-125 MG tablet; Take 1 tablet by mouth 2 (two) times daily.  Dispense: 20 tablet; Refill: 0   Fara Chute, PA-C Physician Assistant-Certified [facility]

## 2015-09-13 ENCOUNTER — Encounter: Payer: Self-pay | Admitting: Physician Assistant

## 2015-10-15 ENCOUNTER — Other Ambulatory Visit: Payer: Self-pay | Admitting: *Deleted

## 2015-10-15 DIAGNOSIS — M79662 Pain in left lower leg: Secondary | ICD-10-CM

## 2015-10-26 ENCOUNTER — Encounter: Payer: Self-pay | Admitting: Vascular Surgery

## 2015-10-29 ENCOUNTER — Ambulatory Visit (INDEPENDENT_AMBULATORY_CARE_PROVIDER_SITE_OTHER): Payer: Medicare Other | Admitting: Vascular Surgery

## 2015-10-29 ENCOUNTER — Ambulatory Visit (HOSPITAL_COMMUNITY)
Admission: RE | Admit: 2015-10-29 | Discharge: 2015-10-29 | Disposition: A | Discharge status: Home / Self Care | Payer: Medicare Other | Source: Ambulatory Visit | Attending: Vascular Surgery | Admitting: Vascular Surgery

## 2015-10-29 ENCOUNTER — Encounter: Payer: Self-pay | Admitting: Vascular Surgery

## 2015-10-29 VITALS — BP 132/81 | HR 76 | Temp 98.6°F | Resp 20 | Ht 69.5 in | Wt 187.4 lb

## 2015-10-29 DIAGNOSIS — I872 Venous insufficiency (chronic) (peripheral): Secondary | ICD-10-CM | POA: Diagnosis not present

## 2015-10-29 DIAGNOSIS — M79662 Pain in left lower leg: Secondary | ICD-10-CM | POA: Diagnosis not present

## 2015-10-29 NOTE — Progress Notes (Signed)
Patient ID: Linzie Collin Der Sommen, male   DOB: 10-06-1945, 70 y.o.   MRN: [id number]  Reason for Consult: New Evaluation (eval L calf burning, vv's )   Referred by Wardell Honour, MD  Subjective:     HPI:  KACIE KRISTIANSEN Der Sommen is a 70 y.o. male presents for evaluation of varicose veins in his left lower extremity. He has never had bleeding or thrombosis that he knows of from [place] varicose veins. They've been there as long she can remember. He does not have a history of DVT. He has associated heaviness and cramping pain in his left lower extremity that is relieved with over-the-counter pain medicine and rest. He also has occasional edema in his left lower extremity that is worse than his right. He does not have limitation to his walking and is otherwise very healthy. He has never been a smoker. Occasional alcohol.  Past Medical History:  Diagnosis Date  . Adenocarcinoma of prostate [facility]) urologist-  dr Tresa Moore--  dx Nov 2011--  T1c, Gleason 3+3,  PSA 4.2 (post IMRT 03/2010   Low risk recurrence, Biochemical control,  last PSA 0.30 on 05-28-2014  . BPH (benign prostatic hyperplasia)   . Diverticulitis   . ED (erectile dysfunction) of organic origin   . GERD (gastroesophageal reflux disease)   . Hiatal hernia   . History of colon polyps    2002 and 2003 hyperplastic  . History of gastritis   . Lower urinary tract symptoms (LUTS)   . Phimosis   . Wears hearing aid    bilateral   Family History  Problem Relation Age of Onset  . Stomach cancer Father   . Cancer Father 56    stomach versus colon cancer  . Pancreatic cancer Sister   . Cancer Sister 49    pancreatic cancer  . Cancer Brother     penile cancer   Past Surgical History:  Procedure Laterality Date  . ABDOMINAL HERNIA REPAIR    . CIRCUMCISION N/A 06/09/2015   Procedure: CIRCUMCISION ADULT WITH PENILE BLOCK;  Surgeon: Alexis Frock, MD;  Location: [facility];  Service: Urology;  Laterality: N/A;    . COLONOSCOPY  last one 11-15-2011  . INGUINAL HERNIA REPAIR Right 12-23-2009  . PROSTATE SURGERY  2009  . SHOULDER ARTHROSCOPY WITH SUBACROMIAL DECOMPRESSION, ROTATOR CUFF REPAIR AND BICEP TENDON REPAIR Left 10-2014    Short Social History:  Social History  Substance Use Topics  . Smoking status: Former Smoker    Types: Cigarettes    Quit date: 09/04/1965  . Smokeless tobacco: Never Used     Comment: quit around 70 yrs old.  . Alcohol use Yes     Comment: 4/day    Allergies  Allergen Reactions  . Flomax [Tamsulosin Hcl]     "bad reaction" jaw pain    Current Outpatient Prescriptions  Medication Sig Dispense Refill  . fluticasone (FLONASE) 50 MCG/ACT nasal spray Place 2 sprays into both nostrils daily. 16 g 6  . Multiple Vitamin (MULTIVITAMIN) tablet Take 1 tablet by mouth daily.    . Omega-3 Fatty Acids (FISH OIL OMEGA-3 PO) Take by mouth.    Marland Kitchen omeprazole (PRILOSEC) 20 MG capsule Take 20 mg by mouth daily.    Marland Kitchen OVER THE COUNTER MEDICATION OTC Ultraflora taking prn for IB     No current facility-administered medications for this visit.     Review of Systems  Constitutional:  Constitutional negative. HENT: HENT negative.  Eyes:  Eyes negative.  Respiratory: Respiratory negative.  Cardiovascular: Positive for leg swelling.  GI: Gastrointestinal negative.  Musculoskeletal: Positive for leg pain.  Skin: Skin negative.  Neurological: Neurological negative. Hematologic: Hematologic/lymphatic negative.  Psychiatric: Psychiatric negative.        Objective:  Objective   Vitals:   10/29/15 1529  BP: 132/81  Pulse: 76  Resp: 20  Temp: 98.6 F (37 C)  TempSrc: Oral  SpO2: 96%  Weight: 187 lb 6.4 oz (85 kg)  Height: 5' 9.5" (1.765 m)   Body mass index is 27.28 kg/m.  Physical Exam  Constitutional: He is oriented to person, place, and time. He appears well-developed and well-nourished.  HENT:  Head: Atraumatic.  Eyes: EOM are normal.  Neck: Normal range of  motion.  Cardiovascular: Normal rate.   Pulses:      Femoral pulses are 2+ on the right side, and 2+ on the left side.      Popliteal pulses are 2+ on the right side, and 2+ on the left side.       Dorsalis pedis pulses are 2+ on the right side.       Posterior tibial pulses are 2+ on the right side, and 2+ on the left side.  Pulmonary/Chest: Effort normal.  Abdominal: Soft. He exhibits no mass.  Musculoskeletal: He exhibits no edema or tenderness.  Varicose veins mostly medial left leg, no ttp  Neurological: He is alert and oriented to person, place, and time.  Skin: Skin is warm and dry.  Psychiatric: He has a normal mood and affect. His behavior is normal. Judgment and thought content normal.    Data: His venous incompetence in the left great and small saphenous vein greater than 500 ms. The great saphenous vein on the left is 0.7 cm at the sf junction.     Assessment/Plan:    70 year old male presents for bothersome left lower extremity varicose veins that cause heaviness and cramping pain in his leg. He does not have history of DVT. He has study today demonstrating multiple varicosities as well as a reflux in his GSV and SSV with a large GSV of 0.7 cm at the SF junction. He was prescribed thigh-high compression stockings today. We discussed the natural history of varicose veins in the treatment that would be necessary including laser ablation of his saphenous vein and likely phlebectomy of his varicosities. He understands this and will follow up with one of my partners in 3 months should he have not have issues before that.    Waynetta Sandy MD [facility]

## 2015-11-02 ENCOUNTER — Ambulatory Visit (INDEPENDENT_AMBULATORY_CARE_PROVIDER_SITE_OTHER): Payer: Medicare Other

## 2015-11-02 DIAGNOSIS — Z23 Encounter for immunization: Secondary | ICD-10-CM | POA: Diagnosis not present

## 2015-11-13 ENCOUNTER — Encounter: Payer: Self-pay | Admitting: Family Medicine

## 2015-12-21 DIAGNOSIS — D2272 Melanocytic nevi of left lower limb, including hip: Secondary | ICD-10-CM | POA: Diagnosis not present

## 2015-12-21 DIAGNOSIS — D1801 Hemangioma of skin and subcutaneous tissue: Secondary | ICD-10-CM | POA: Diagnosis not present

## 2015-12-21 DIAGNOSIS — D2262 Melanocytic nevi of left upper limb, including shoulder: Secondary | ICD-10-CM | POA: Diagnosis not present

## 2015-12-21 DIAGNOSIS — L821 Other seborrheic keratosis: Secondary | ICD-10-CM | POA: Diagnosis not present

## 2015-12-28 ENCOUNTER — Other Ambulatory Visit: Payer: Self-pay | Admitting: Family Medicine

## 2016-01-01 NOTE — Telephone Encounter (Signed)
Patient was here 06/22/15 for his physical More recently for abdominal pain Ok to refill per protocol.

## 2016-01-05 ENCOUNTER — Ambulatory Visit (INDEPENDENT_AMBULATORY_CARE_PROVIDER_SITE_OTHER): Payer: Medicare Other | Admitting: Family Medicine

## 2016-01-05 VITALS — BP 122/72 | HR 92 | Temp 98.0°F | Resp 17 | Ht 70.5 in | Wt 188.0 lb

## 2016-01-05 DIAGNOSIS — H6123 Impacted cerumen, bilateral: Secondary | ICD-10-CM | POA: Diagnosis not present

## 2016-01-05 DIAGNOSIS — J0120 Acute ethmoidal sinusitis, unspecified: Secondary | ICD-10-CM | POA: Diagnosis not present

## 2016-01-05 MED ORDER — AMOXICILLIN 500 MG PO TABS: 500.0000 mg | ORAL_TABLET | Freq: Three times a day (TID) | ORAL | 0 refills | Status: DC

## 2016-01-05 NOTE — Patient Instructions (Addendum)
Use nasal saline spray into both snares every 1-2 hours throughout the day. The more frequently you can use this, the quicker you will notice symptom relief. Okay to stay on Flonase for now as long as you are not having any nosebleeds. If you do hold the Flonase for 3-4 days and then resume.  I recommend placing a humidifier right next year bed. As frequently as you can do steam treatments either with hot showers, drinking hot tea and soups, or boiling a pot of water and placing a towel over your head while holding her head above it should all help thin and mobilize your mucus.  Staying away from the [facility] for now since it will likely be too drying and will elevate your blood pressure. It would be okay to use some over-the-counter Mucinex to thin your congestion if desired.  IF you received an x-ray today, you will receive an invoice from [facility]. Please contact [facility] at [phone] with questions or concerns regarding your invoice.   IF you received labwork today, you will receive an invoice from [employer]. Please contact [facility] at [phone] with questions or concerns regarding your invoice.   Our billing staff will not be able to assist you with questions regarding bills from these companies.  You will be contacted with the lab results as soon as they are available. The fastest way to get your results is to activate your My Chart account. Instructions are located on the last page of this paperwork. If you have not heard from [place] regarding the results in 2 weeks, please contact this office.      Sinusitis, Adult Sinusitis is soreness and inflammation of your sinuses. Sinuses are hollow spaces in the bones around your face. Your sinuses are located:  Around your eyes.  In the middle of your forehead.  Behind your nose.  In your cheekbones. Your sinuses and nasal passages are lined with a stringy fluid (mucus). Mucus normally  drains out of your sinuses. When your nasal tissues become inflamed or swollen, the mucus can become trapped or blocked so air cannot flow through your sinuses. This allows bacteria, viruses, and funguses to grow, which leads to infection. Sinusitis can develop quickly and last for 7?10 days (acute) or for more than 12 weeks (chronic). Sinusitis often develops after a cold. What are the causes? This condition is caused by anything that creates swelling in the sinuses or stops mucus from draining, including:  Allergies.  Asthma.  Bacterial or viral infection.  Abnormally shaped bones between the nasal passages.  Nasal growths that contain mucus (nasal polyps).  Narrow sinus openings.  Pollutants, such as chemicals or irritants in the air.  A foreign object stuck in the nose.  A fungal infection. This is rare. What increases the risk? The following factors may make you more likely to develop this condition:  Having allergies or asthma.  Having had a recent cold or respiratory tract infection.  Having structural deformities or blockages in your nose or sinuses.  Having a weak immune system.  Doing a lot of swimming or diving.  Overusing nasal sprays.  Smoking. What are the signs or symptoms? The main symptoms of this condition are pain and a feeling of pressure around the affected sinuses. Other symptoms include:  Upper toothache.  Earache.  Headache.  Bad breath.  Decreased sense of smell and taste.  A cough that may get worse at night.  Fatigue.  Fever.  Thick drainage from  your nose. The drainage is often green and it may contain pus (purulent).  Stuffy nose or congestion.  Postnasal drip. This is when extra mucus collects in the throat or back of the nose.  Swelling and warmth over the affected sinuses.  Sore throat.  Sensitivity to light. How is this diagnosed? This condition is diagnosed based on symptoms, a medical history, and a physical exam.  To find out if your condition is acute or chronic, your health care provider may:  Look in your nose for signs of nasal polyps.  Tap over the affected sinus to check for signs of infection.  View the inside of your sinuses using an imaging device that has a light attached (endoscope). If your health care provider suspects that you have chronic sinusitis, you may also:  Be tested for allergies.  Have a sample of mucus taken from your nose (nasal culture) and checked for bacteria.  Have a mucus sample examined to see if your sinusitis is related to an allergy. If your sinusitis does not respond to treatment and it lasts longer than 8 weeks, you may have an MRI or CT scan to check your sinuses. These scans also help to determine how severe your infection is. In rare cases, a bone biopsy may be done to rule out more serious types of fungal sinus disease. How is this treated? Treatment for sinusitis depends on the cause and whether your condition is chronic or acute. If a virus is causing your sinusitis, your symptoms will go away on their own within 10 days. You may be given medicines to relieve your symptoms, including:  Topical nasal decongestants. They shrink swollen nasal passages and let mucus drain from your sinuses.  Antihistamines. These drugs block inflammation that is triggered by allergies. This can help to ease swelling in your nose and sinuses.  Topical nasal corticosteroids. These are nasal sprays that ease inflammation and swelling in your nose and sinuses.  Nasal saline washes. These rinses can help to get rid of thick mucus in your nose. If your condition is caused by bacteria, you will be given an antibiotic medicine. If your condition is caused by a fungus, you will be given an antifungal medicine. Surgery may be needed to correct underlying conditions, such as narrow nasal passages. Surgery may also be needed to remove polyps. Follow these instructions at  home: Medicines  Take, use, or apply over-the-counter and prescription medicines only as told by your health care provider. These may include nasal sprays.  If you were prescribed an antibiotic medicine, take it as told by your health care provider. Do not stop taking the antibiotic even if you start to feel better. Hydrate and Humidify  Drink enough water to keep your urine clear or pale yellow. Staying hydrated will help to thin your mucus.  Use a cool mist humidifier to keep the humidity level in your home above 50%.  Inhale steam for 10-15 minutes, 3-4 times a day or as told by your health care provider. You can do this in the bathroom while a hot shower is running.  Limit your exposure to cool or dry air. Rest  Rest as much as possible.  Sleep with your head raised (elevated).  Make sure to get enough sleep each night. General instructions  Apply a warm, moist washcloth to your face 3-4 times a day or as told by your health care provider. This will help with discomfort.  Wash your hands often with soap and water to  reduce your exposure to viruses and other germs. If soap and water are not available, use hand sanitizer.  Do not smoke. Avoid being around people who are smoking (secondhand smoke).  Keep all follow-up visits as told by your health care provider. This is important. Contact a health care provider if:  You have a fever.  Your symptoms get worse.  Your symptoms do not improve within 10 days. Get help right away if:  You have a severe headache.  You have persistent vomiting.  You have pain or swelling around your face or eyes.  You have vision problems.  You develop confusion.  Your neck is stiff.  You have trouble breathing. This information is not intended to replace advice given to you by your health care provider. Make sure you discuss any questions you have with your health care provider. Document Released: 01/09/2005 Document Revised:  09/05/2015 Document Reviewed: 11/04/2014 Elsevier Interactive Patient Education  2017 [employer].

## 2016-01-05 NOTE — Progress Notes (Signed)
By signing my name below, I, Mesha Guinyard, attest that this documentation has been prepared under the direction and in the presence of Delman Cheadle, MD.  Electronically Signed: Verlee Monte, Medical Scribe. 01/05/16. 12:16 PM.  Subjective:    Patient ID: Oscar Smith, male    DOB: [birth year], 70 y.o.   MRN: [id number]  HPI Chief Complaint  Patient presents with  . Sinusitis  . Nasal Congestion    HPI Comments: Oscar Smith is a 70 y.o. male who presents to the [facility] complaining of trouble breathing onset 2 weeks ago. Reports associated sxs of sleep disturbance, nasal congestion, rhinorrhea, sore throat, and wet cough. Pt went to a family gathering that had sick children and when he got back home he didn't feel well. He has been using fluticasone daily, and ricola drops for his sxs. He does not have a humidifier. Denies fevers, chills, sinus pain, and dental pain.  Reports high diastolic in the 0000000 over the past half year.  Patient Active Problem List   Diagnosis Date Noted  . Enlarged prostate with urinary retention 12/25/2013  . Erectile dysfunction 12/25/2013  . Incomplete bladder emptying 08/27/2013  . Routine health maintenance 12/25/2010  . Allergic rhinitis 09/24/2010  . ADENOCARCINOMA, PROSTATE, GLEASON GRADE 6 12/21/2009  . INGUINAL HERNIA, RIGHT 12/21/2009  . GERD 11/12/2008   Past Medical History:  Diagnosis Date  . Adenocarcinoma of prostate [facility]) urologist-  dr Tresa Moore--  dx Nov 2011--  T1c, Gleason 3+3,  PSA 4.2 (post IMRT 03/2010   Low risk recurrence, Biochemical control,  last PSA 0.30 on 05-28-2014  . BPH (benign prostatic hyperplasia)   . Diverticulitis   . ED (erectile dysfunction) of organic origin   . GERD (gastroesophageal reflux disease)   . Hiatal hernia   . History of colon polyps    2002 and 2003 hyperplastic  . History of gastritis   . Lower urinary tract symptoms (LUTS)   . Phimosis   . Wears  hearing aid    bilateral   Past Surgical History:  Procedure Laterality Date  . ABDOMINAL HERNIA REPAIR    . CIRCUMCISION N/A 06/09/2015   Procedure: CIRCUMCISION ADULT WITH PENILE BLOCK;  Surgeon: Alexis Frock, MD;  Location: [facility];  Service: Urology;  Laterality: N/A;  . COLONOSCOPY  last one 11-15-2011  . INGUINAL HERNIA REPAIR Right 12-23-2009  . PROSTATE SURGERY  2009  . SHOULDER ARTHROSCOPY WITH SUBACROMIAL DECOMPRESSION, ROTATOR CUFF REPAIR AND BICEP TENDON REPAIR Left 10-2014   Allergies  Allergen Reactions  . Flomax [Tamsulosin Hcl]     "bad reaction" jaw pain   Prior to Admission medications   Medication Sig Start Date End Date Taking? Authorizing Provider  fluticasone (FLONASE) 50 MCG/ACT nasal spray USE 2 SPRAYS IN EACH NOSTRIL ONCE DAILY 01/01/16  Yes Chelle Jeffery, PA-C  Multiple Vitamin (MULTIVITAMIN) tablet Take 1 tablet by mouth daily.   Yes Historical Provider, MD  Omega-3 Fatty Acids (FISH OIL OMEGA-3 PO) Take by mouth.   Yes Historical Provider, MD  omeprazole (PRILOSEC) 20 MG capsule Take 20 mg by mouth daily.   Yes Historical Provider, MD  OVER THE COUNTER MEDICATION OTC Ultraflora taking prn for IB   Yes Historical Provider, MD   Social History   Social History  . Marital status: Married    Spouse name: N/A  . Number of children: 0  . Years of education: BS   Occupational History  .  [employer]    Social History Main Topics  . Smoking status: Former Smoker    Types: Cigarettes    Quit date: 09/04/1965  . Smokeless tobacco: Never Used     Comment: quit around 70 yrs old.  . Alcohol use Yes     Comment: 4/day  . Drug use: No  . Sexual activity: Yes    Partners: Male   Other Topics Concern  . Not on file   Social History Narrative   Originally from [place].   Trained as an Chief Financial Officer.    Married '75- 110 years, divorced; Married '86. No children.    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 Sculptor-life size figures by commission (see work in [employer] park.) Learning the piano. Patient get regular exercise.   Wife is a Marine scientist.   Caffeine: 3/day          Review of Systems  Constitutional: Negative for chills and fever.  HENT: Positive for congestion, rhinorrhea and sore throat. Negative for dental problem and sinus pain.   Respiratory: Positive for cough.   Psychiatric/Behavioral: Positive for sleep disturbance.   Objective:  Physical Exam  Constitutional: He appears well-developed and well-nourished. No distress.  HENT:  Head: Normocephalic and atraumatic.  Mouth/Throat: Posterior oropharyngeal erythema present.  Septum deviated to the left and septumm has large amount of friability and scabing and purulent drainage bilaterally Enlarged tonsillar nodes  Eyes: Conjunctivae are normal.  Neck: Neck supple.  Cardiovascular: Normal rate, regular rhythm, S1 normal, S2 normal and normal heart sounds.  Exam reveals no gallop.   No murmur heard. Pulses:      Radial pulses are 2+ on the right side, and 2+ on the left side.  Pulmonary/Chest: Effort normal and breath sounds normal. No respiratory distress. He has no decreased breath sounds. He has no wheezes. He has no rhonchi. He has no rales.  Neurological: He is alert.  Skin: Skin is warm and dry.  Psychiatric: He has a normal mood and affect. His behavior is normal.  Nursing note and vitals reviewed.  BP 122/72 (BP Location: Right Arm, Patient Position: Sitting, Cuff Size: Normal)   Pulse 92   Temp 98 F (36.7 C) (Oral)   Resp 17   Ht 5' 10.5" (1.791 m)   Wt 188 lb (85.3 kg)   SpO2 98%   BMI 26.59 kg/m   Assessment & Plan:   1. Acute non-recurrent ethmoidal sinusitis   2. Bilateral impacted cerumen - by lavage    Orders Placed This Encounter  Procedures  . Ear wax removal    bilateral  . Care order/instruction:    AVS printed - let patient go!     Meds ordered this encounter  Medications  . amoxicillin (AMOXIL) 500 MG tablet    Sig: Take 1 tablet (500 mg total) by mouth 3 (three) times daily.    Dispense:  21 tablet    Refill:  0    I personally performed the services described in this documentation, which was scribed in my presence. The recorded information has been reviewed and considered, and addended by me as needed.   Delman Cheadle, M.D.  [facility] [address] [place], [place] [address] [phone] phone [phone] fax  01/05/16 1:06 PM

## 2016-01-14 ENCOUNTER — Encounter: Payer: Self-pay | Admitting: Vascular Surgery

## 2016-02-01 ENCOUNTER — Ambulatory Visit (INDEPENDENT_AMBULATORY_CARE_PROVIDER_SITE_OTHER): Payer: Medicare Other | Admitting: Vascular Surgery

## 2016-02-01 ENCOUNTER — Encounter: Payer: Self-pay | Admitting: Vascular Surgery

## 2016-02-01 VITALS — BP 153/93 | HR 70 | Temp 97.1°F | Resp 18 | Ht 70.5 in | Wt 185.0 lb

## 2016-02-01 DIAGNOSIS — I83899 Varicose veins of unspecified lower extremities with other complications: Secondary | ICD-10-CM

## 2016-02-01 DIAGNOSIS — I872 Venous insufficiency (chronic) (peripheral): Secondary | ICD-10-CM | POA: Diagnosis not present

## 2016-02-01 NOTE — Progress Notes (Signed)
Problems with Activities of Daily Living Secondary to Leg Pain  1. 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 Standing for prolonged periods is difficult for him due to leg pain.         Failure of  Conservative Therapy:  1. Worn 20-30 mm Hg thigh high compression hose >3 months with no relief of symptoms.  2. Frequently elevates legs-no relief of symptoms  3. Taken Ibuprofen 600 Mg TID with no relief of symptoms.                                        Vascular and Vein Specialist of [facility]  Patient name: Oscar Smith Der Smith MRN: [id number] DOB: 26-Jan-1945 Sex: male  REASON FOR VISIT: Follow-up of symptomatic left leg venous varicosities  HPI: Oscar Smith is a 71 y.o. male here today for follow-up.  He was last seen 3 months ago.  He has been compliant with his compression garment since that time.  He stands for great deal of his a day and work and reports that time an achy sensation with this.  He does have very pronounced varicosities in his medial thigh and calf and reports these become very distended if he is standing for prolonged periods as well.  Past Medical History:  Diagnosis Date  . Adenocarcinoma of prostate [facility]) urologist-  dr Tresa Moore--  dx Nov 2011--  T1c, Gleason 3+3,  PSA 4.2 (post IMRT 03/2010   Low risk recurrence, Biochemical control,  last PSA 0.30 on 05-28-2014  . BPH (benign prostatic hyperplasia)   . Diverticulitis   . ED (erectile dysfunction) of organic origin   . GERD (gastroesophageal reflux disease)   . Hiatal hernia   . History of colon polyps    2002 and 2003 hyperplastic  . History of gastritis   . Lower urinary tract symptoms (LUTS)   . Phimosis   . Wears hearing aid    bilateral    Family History  Problem Relation Age of Onset  . Stomach cancer Father   . Cancer Father 109    stomach versus colon cancer  . Pancreatic cancer Sister   . Cancer Sister 88    pancreatic cancer  . Cancer Brother      penile cancer    SOCIAL HISTORY: Social History  Substance Use Topics  . Smoking status: Former Smoker    Types: Cigarettes    Quit date: 09/04/1965  . Smokeless tobacco: Never Used     Comment: quit around 71 yrs old.  . Alcohol use Yes     Comment: 4/day    Allergies  Allergen Reactions  . Flomax [Tamsulosin Hcl]     "bad reaction" jaw pain    Current Outpatient Prescriptions  Medication Sig Dispense Refill  . fluticasone (FLONASE) 50 MCG/ACT nasal spray USE 2 SPRAYS IN EACH NOSTRIL ONCE DAILY 16 g 0  . Multiple Vitamin (MULTIVITAMIN) tablet Take 1 tablet by mouth daily.    . Omega-3 Fatty Acids (FISH OIL OMEGA-3 PO) Take by mouth.    Marland Kitchen omeprazole (PRILOSEC) 20 MG capsule Take 20 mg by mouth daily.    Marland Kitchen OVER THE COUNTER MEDICATION OTC Ultraflora taking prn for IB    . amoxicillin (AMOXIL) 500 MG tablet Take 1 tablet (500 mg total) by mouth 3 (three) times daily. (Patient not taking: Reported on 02/01/2016)  21 tablet 0   No current facility-administered medications for this visit.     REVIEW OF SYSTEMS:  [X]  denotes positive finding, [ ]  denotes negative finding Cardiac  Comments:  Chest pain or chest pressure:    Shortness of breath upon exertion:    Short of breath when lying flat:    Irregular heart rhythm:        Vascular    Pain in calf, thigh, or hip brought on by ambulation:    Pain in feet at night that wakes you up from your sleep:     Blood clot in your veins:    Leg swelling:           PHYSICAL EXAM: Vitals:   02/01/16 0938 02/01/16 0939  BP: (!) 158/95 (!) 153/93  Pulse: 70   Resp: 18   Temp: 97.1 F (36.2 C)   TempSrc: Oral   SpO2: 97%   Weight: 185 lb (83.9 kg)   Height: 5' 10.5" (1.791 m)     GENERAL: The patient is a well-nourished male, in no acute distress. The vital signs are documented above. CARDIOVASCULAR: Palpable radial pulses.  Marked dilatation of the varicosities throughout his medial thigh and calf PULMONARY: There is good  air exchange  MUSCULOSKELETAL: There are no major deformities or cyanosis. NEUROLOGIC: No focal weakness or paresthesias are detected. SKIN: There are no ulcers or rashes noted. PSYCHIATRIC: The patient has a normal affect.  DATA:  I reviewed his duplex from 10/29/2015 and also images vein Site.  He Does Have Dilatation of the Saphenous Vein throughout the Thigh This Does Give Rise to a Large Branch That Becomes close to the surface and is the feeding into the large calf varicosities which are causing discomfort  MEDICAL ISSUES: Failed conservative treatment of left leg venous hypertension and varicose veins with complications.  Have recommended laser ablation of his left great saphenous vein.  He does have a curvature varicosities and we will proceed with stab phlebectomy of several of these more prominent was a same setting symptom relief.  We will schedule this at his earliest convenience.  He understands a slight risk of DVT associated with this.  Understands this is under straight local anesthesia    Rosetta Posner, MD [facility] Vascular and Vein Specialists of [facility] Tel [phone] Pager [phone]

## 2016-02-02 ENCOUNTER — Other Ambulatory Visit: Payer: Self-pay | Admitting: *Deleted

## 2016-02-02 DIAGNOSIS — I83892 Varicose veins of left lower extremities with other complications: Secondary | ICD-10-CM

## 2016-02-08 ENCOUNTER — Encounter: Payer: Self-pay | Admitting: Vascular Surgery

## 2016-02-10 ENCOUNTER — Encounter: Payer: Self-pay | Admitting: Vascular Surgery

## 2016-02-10 ENCOUNTER — Ambulatory Visit (INDEPENDENT_AMBULATORY_CARE_PROVIDER_SITE_OTHER): Payer: Medicare Other | Admitting: Vascular Surgery

## 2016-02-10 ENCOUNTER — Other Ambulatory Visit: Payer: Self-pay | Admitting: Physician Assistant

## 2016-02-10 VITALS — BP 158/94 | HR 68 | Temp 97.8°F | Resp 20 | Ht 70.5 in | Wt 189.3 lb

## 2016-02-10 DIAGNOSIS — I83899 Varicose veins of unspecified lower extremities with other complications: Secondary | ICD-10-CM

## 2016-02-10 DIAGNOSIS — I83892 Varicose veins of left lower extremities with other complications: Secondary | ICD-10-CM | POA: Diagnosis not present

## 2016-02-10 HISTORY — PX: ENDOVENOUS ABLATION SAPHENOUS VEIN W/ LASER: SUR449

## 2016-02-10 NOTE — Progress Notes (Signed)
     Laser Ablation Procedure    Date: 02/10/2016   Oscar Smith DOB:16-Feb-1945  Consent signed: Yes    Surgeon:  Dr. Sherren Mocha Isaiyah Feldhaus  Procedure: Laser Ablation: left Greater Saphenous Vein  BP (!) 158/94 (BP Location: Left Arm) Comment: recheck  Pulse 68   Temp 97.8 F (36.6 C) (Oral)   Resp 20   Ht 5' 10.5" (1.791 m)   Wt 189 lb 4.8 oz (85.9 kg)   SpO2 98%   BMI 26.78 kg/m   Tumescent Anesthesia: 450 cc 0.9% NaCl with 50 cc Lidocaine HCL with 1% Epi and 15 cc 8.4% NaHCO3  Local Anesthesia: 3 cc Lidocaine HCL and NaHCO3 (ratio 2:1)  15 watts continuous mode        Total energy: 1988 Joules   Total time: 2:13   Stab Phlebectomy: <10 incisions Sites: Calf left leg  Patient tolerated procedure well   Description of Procedure:  After marking the course of the secondary varicosities, the patient was placed on the operating table in the supine position, and the left leg was prepped and draped in sterile fashion.   Local anesthetic was administered and under ultrasound guidance the saphenous vein was accessed with a micro needle and guide wire; then the mirco puncture sheath was placed.  A guide wire was inserted saphenofemoral junction , followed by a 5 french sheath.  The position of the sheath and then the laser fiber below the junction was confirmed using the ultrasound.  Tumescent anesthesia was administered along the course of the saphenous vein using ultrasound guidance. The patient was placed in Trendelenburg position and protective laser glasses were placed on patient and staff, and the laser was fired at 15 watts continuous mode advancing 1-66mm/second for a total of 1988 joules.   For stab phlebectomies, local anesthetic was administered at the previously marked varicosities, and tumescent anesthesia was administered around the vessels.Less than 10 stab wounds were made using the tip of an 11 blade. And using the vein hook, the phlebectomies were performed using a  hemostat to avulse the varicosities.  Adequate hemostasis was achieved.       Steri strips were applied to the stab wounds and ABD pads and thigh high compression stockings were applied.  Ace wrap bandages were applied over the phlebectomy sites and at the top of the saphenofemoral junction. Blood loss was less than 15 cc.  The patient ambulated out of the operating room having tolerated the procedure well.  Uneventful ablation from medial knee to just below the saphenofemoral junction. Had a superficial branch that arose in mid thigh and extended down the knee and onto the calf. Several stabs over this and several varicosities without difficulty. Follow-up in one week

## 2016-02-11 ENCOUNTER — Telehealth: Payer: Self-pay | Admitting: *Deleted

## 2016-02-11 NOTE — Telephone Encounter (Signed)
    02/11/2016  Time: 9:46 AM   Patient Name: Oscar Smith Der Smith  Patient of: T.F. Early  Procedure:Laser Ablation left greater saphenous vein and stab phlebectomy < 10 incisions left leg 02-10-2016   Reached patient at home and checked  His status  Yes    Comments/Actions Taken: Mr. Oscar Smith states no left leg pain, swelling, or bleeding/oozing.  Reviewed all post procedural instructions with him and reminded him of post laser ablation duplex and VV follow up appointment with Dr. Donnetta Hutching on 02-17-2016.      @SIGNATURE @

## 2016-02-17 ENCOUNTER — Ambulatory Visit (INDEPENDENT_AMBULATORY_CARE_PROVIDER_SITE_OTHER): Payer: Medicare Other | Admitting: Vascular Surgery

## 2016-02-17 ENCOUNTER — Ambulatory Visit (HOSPITAL_COMMUNITY)
Admission: RE | Admit: 2016-02-17 | Discharge: 2016-02-17 | Disposition: A | Discharge status: Home / Self Care | Payer: Medicare Other | Source: Ambulatory Visit | Attending: Vascular Surgery | Admitting: Vascular Surgery

## 2016-02-17 VITALS — BP 140/88 | HR 74 | Resp 16

## 2016-02-17 DIAGNOSIS — I83899 Varicose veins of unspecified lower extremities with other complications: Secondary | ICD-10-CM | POA: Diagnosis not present

## 2016-02-17 DIAGNOSIS — I83892 Varicose veins of left lower extremities with other complications: Secondary | ICD-10-CM | POA: Diagnosis not present

## 2016-02-17 NOTE — Progress Notes (Signed)
Vascular and Vein Specialist of [facility]  Patient name: Oscar Smith Der Smith MRN: [id number] DOB: [birth year] Sex: male  REASON FOR VISIT: One-week follow-up after laser ablation of left great saphenous vein and stab phlebectomy of tributary varicosities  HPI: Oscar Smith is a 71 y.o. male here today for follow-up. Is done quite well with the procedure. Has the typical amount of discomfort over the ablation site and minimal discomfort associated with the phlebectomy sites.  Past Medical History:  Diagnosis Date  . Adenocarcinoma of prostate [facility]) urologist-  dr Tresa Moore--  dx Nov 2011--  T1c, Gleason 3+3,  PSA 4.2 (post IMRT 03/2010   Low risk recurrence, Biochemical control,  last PSA 0.30 on 05-28-2014  . BPH (benign prostatic hyperplasia)   . Diverticulitis   . ED (erectile dysfunction) of organic origin   . GERD (gastroesophageal reflux disease)   . Hiatal hernia   . History of colon polyps    2002 and 2003 hyperplastic  . History of gastritis   . Lower urinary tract symptoms (LUTS)   . Phimosis   . Wears hearing aid    bilateral    Family History  Problem Relation Age of Onset  . Stomach cancer Father   . Cancer Father 62    stomach versus colon cancer  . Pancreatic cancer Sister   . Cancer Sister 61    pancreatic cancer  . Cancer Brother     penile cancer    SOCIAL HISTORY: Social History  Substance Use Topics  . Smoking status: Former Smoker    Types: Cigarettes    Quit date: 09/04/1965  . Smokeless tobacco: Never Used     Comment: quit around 71 yrs old.  . Alcohol use Yes     Comment: 4/day    Allergies  Allergen Reactions  . Flomax [Tamsulosin Hcl]     "bad reaction" jaw pain    Current Outpatient Prescriptions  Medication Sig Dispense Refill  . fluticasone (FLONASE) 50 MCG/ACT nasal spray USE 2 SPRAYS IN EACH NOSTRIL ONCE DAILY 16 g 1  . Multiple Vitamin (MULTIVITAMIN) tablet Take 1 tablet by  mouth daily.    . Omega-3 Fatty Acids (FISH OIL OMEGA-3 PO) Take by mouth.    Marland Kitchen omeprazole (PRILOSEC) 20 MG capsule Take 20 mg by mouth daily.    Marland Kitchen OVER THE COUNTER MEDICATION OTC Ultraflora taking prn for IB     No current facility-administered medications for this visit.     REVIEW OF SYSTEMS:  [X]  denotes positive finding, [ ]  denotes negative finding Cardiac  Comments:  Chest pain or chest pressure:    Shortness of breath upon exertion:    Short of breath when lying flat:    Irregular heart rhythm:        Vascular    Pain in calf, thigh, or hip brought on by ambulation:    Pain in feet at night that wakes you up from your sleep:     Blood clot in your veins:    Leg swelling:           PHYSICAL EXAM: Vitals:   02/17/16 0952  BP: 140/88  Pulse: 74  Resp: 16    GENERAL: The patient is a well-nourished male, in no acute distress. The vital signs are documented above. CARDIOVASCULAR: Palpable dorsalis pedis pulse on the left. Mild erythema over the medial thigh. Does have thickening at the area of the subcuticular portion of the vein was ablated.  Stab phlebectomy is are all healing quite nicely with moderate bruising. PULMONARY: There is good air exchange  MUSCULOSKELETAL: There are no major deformities or cyanosis. NEUROLOGIC: No focal weakness or paresthesias are detected. SKIN: There are no ulcers or rashes noted. PSYCHIATRIC: The patient has a normal affect.  DATA:  Reviewed the duplex with patient today which shows closure of the great saphenous vein from the medial knee to within 1 cm the saphenofemoral junction and no evidence of DVT  MEDICAL ISSUES: Excellent Ottilia Pippenger result from laser ablation and stab phlebectomy for symptomatic varicosities. He will continue his compression garments one additional week and then as needed. He will see [place] again on an as-needed basis    Rosetta Posner, MD [facility] Vascular and Vein Specialists of [facility] Tel [phone] Pager [phone]

## 2016-02-24 ENCOUNTER — Other Ambulatory Visit: Payer: Medicare Other | Admitting: Vascular Surgery

## 2016-03-02 ENCOUNTER — Ambulatory Visit: Payer: Medicare Other | Admitting: Vascular Surgery

## 2016-03-02 ENCOUNTER — Encounter (HOSPITAL_COMMUNITY): Payer: Medicare Other

## 2016-03-06 ENCOUNTER — Ambulatory Visit (INDEPENDENT_AMBULATORY_CARE_PROVIDER_SITE_OTHER): Payer: Medicare Other | Admitting: Emergency Medicine

## 2016-03-06 VITALS — BP 118/56 | HR 80 | Temp 98.2°F | Ht 70.5 in | Wt 185.0 lb

## 2016-03-06 DIAGNOSIS — R059 Cough, unspecified: Secondary | ICD-10-CM

## 2016-03-06 DIAGNOSIS — J111 Influenza due to unidentified influenza virus with other respiratory manifestations: Secondary | ICD-10-CM | POA: Diagnosis not present

## 2016-03-06 DIAGNOSIS — R05 Cough: Secondary | ICD-10-CM

## 2016-03-06 MED ORDER — OSELTAMIVIR PHOSPHATE 75 MG PO CAPS: 75.0000 mg | ORAL_CAPSULE | Freq: Two times a day (BID) | ORAL | 0 refills | Status: AC

## 2016-03-06 MED ORDER — PROMETHAZINE-CODEINE 6.25-10 MG/5ML PO SYRP: 5.0000 mL | ORAL_SOLUTION | Freq: Every evening | ORAL | 0 refills | Status: DC | PRN

## 2016-03-06 NOTE — Progress Notes (Signed)
Oscar Smith 71 y.o.   Chief Complaint  Patient presents with  . Cough    X 2 days with chest pains  . Dizziness    X 2 days    HISTORY OF PRESENT ILLNESS: This is a 71 y.o. male complaining of flu symptoms since yesterday; wife recently diagnosed with influenza.  Influenza  This is a new problem. The current episode started yesterday. The problem occurs constantly. The problem has been rapidly worsening. Associated symptoms include chest pain (pleuritic with cough), chills, congestion, coughing, a fever, headaches, myalgias and nausea. Pertinent negatives include no abdominal pain, arthralgias, neck pain, rash, sore throat, urinary symptoms, vertigo, visual change or vomiting. He has tried nothing for the symptoms.     Prior to Admission medications   Medication Sig Start Date End Date Taking? Authorizing Provider  fluticasone (FLONASE) 50 MCG/ACT nasal spray USE 2 SPRAYS IN EACH NOSTRIL ONCE DAILY 02/11/16  Yes Wardell Honour, MD  Multiple Vitamin (MULTIVITAMIN) tablet Take 1 tablet by mouth daily.   Yes Historical Provider, MD  Omega-3 Fatty Acids (FISH OIL OMEGA-3 PO) Take by mouth.   Yes Historical Provider, MD  omeprazole (PRILOSEC) 20 MG capsule Take 20 mg by mouth daily.   Yes Historical Provider, MD  OVER THE COUNTER MEDICATION OTC Ultraflora taking prn for IB   Yes Historical Provider, MD  oseltamivir (TAMIFLU) 75 MG capsule Take 1 capsule (75 mg total) by mouth 2 (two) times daily. 03/06/16 03/11/16  Horald Pollen, MD  promethazine-codeine [facility] WITH CODEINE) 6.25-10 MG/5ML syrup Take 5 mLs by mouth at bedtime as needed for cough. 03/06/16   Horald Pollen, MD    Allergies  Allergen Reactions  . Flomax [Tamsulosin Hcl]     "bad reaction" jaw pain    Patient Active Problem List   Diagnosis Date Noted  . Enlarged prostate with urinary retention 12/25/2013  . Erectile dysfunction 12/25/2013  . Incomplete bladder emptying 08/27/2013  .  Routine health maintenance 12/25/2010  . Allergic rhinitis 09/24/2010  . ADENOCARCINOMA, PROSTATE, GLEASON GRADE 6 12/21/2009  . INGUINAL HERNIA, RIGHT 12/21/2009  . GERD 11/12/2008    Past Medical History:  Diagnosis Date  . Adenocarcinoma of prostate [facility]) urologist-  dr Tresa Moore--  dx Nov 2011--  T1c, Gleason 3+3,  PSA 4.2 (post IMRT 03/2010   Low risk recurrence, Biochemical control,  last PSA 0.30 on 05-28-2014  . BPH (benign prostatic hyperplasia)   . Diverticulitis   . ED (erectile dysfunction) of organic origin   . GERD (gastroesophageal reflux disease)   . Hiatal hernia   . History of colon polyps    2002 and 2003 hyperplastic  . History of gastritis   . Lower urinary tract symptoms (LUTS)   . Phimosis   . Wears hearing aid    bilateral    Past Surgical History:  Procedure Laterality Date  . ABDOMINAL HERNIA REPAIR    . CIRCUMCISION N/A 06/09/2015   Procedure: CIRCUMCISION ADULT WITH PENILE BLOCK;  Surgeon: Alexis Frock, MD;  Location: [facility];  Service: Urology;  Laterality: N/A;  . COLONOSCOPY  last one 11-15-2011  . ENDOVENOUS ABLATION SAPHENOUS VEIN W/ LASER Left 02/10/2016   endovenous laser ablation left greater saphenous vein and stab phlebectomy left leg by Curt Jews MD  . INGUINAL HERNIA REPAIR Right 12-23-2009  . PROSTATE SURGERY  2009  . SHOULDER ARTHROSCOPY WITH SUBACROMIAL DECOMPRESSION, ROTATOR CUFF REPAIR AND BICEP TENDON REPAIR Left 10-2014  Social History   Social History  . Marital status: Married    Spouse name: N/A  . Number of children: 0  . Years of education: BS   Occupational History  . [employer]    Social History Main Topics  . Smoking status: Former Smoker    Types: Cigarettes    Quit date: 09/04/1965  . Smokeless tobacco: Never Used     Comment: quit around 71 yrs old.  . Alcohol use Yes     Comment: 4/day  . Drug use: No  . Sexual activity: Yes    Partners: Male   Other Topics Concern  .  Not on file   Social History Narrative   Originally from [place].   Trained as an Chief Financial Officer.    Married '75- 66 years, divorced; Married '86. No children.    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 Sculptor-life size figures by commission (see work in [employer] park.) Learning the piano. Patient get regular exercise.   Wife is a Marine scientist.   Caffeine: 3/day           Family History  Problem Relation Age of Onset  . Stomach cancer Father   . Cancer Father 43    stomach versus colon cancer  . Pancreatic cancer Sister   . Cancer Sister 31    pancreatic cancer  . Cancer Brother     penile cancer     Review of Systems  Constitutional: Positive for chills, fever and malaise/fatigue.  HENT: Positive for congestion. Negative for ear discharge, ear pain, nosebleeds, sinus pain and sore throat.   Eyes: Negative for discharge and redness.  Respiratory: Positive for cough. Negative for sputum production, shortness of breath and wheezing.   Cardiovascular: Positive for chest pain (pleuritic with cough). Negative for palpitations and leg swelling.  Gastrointestinal: Positive for diarrhea and nausea. Negative for abdominal pain, blood in stool and vomiting.  Genitourinary: Negative for dysuria and hematuria.  Musculoskeletal: Positive for myalgias. Negative for arthralgias and neck pain.  Skin: Negative for rash.  Neurological: Positive for dizziness and headaches. Negative for vertigo.  Endo/Heme/Allergies: Does not bruise/bleed easily.  All other systems reviewed and are negative.  Vitals:   03/06/16 0941  BP: (!) 118/56  Pulse: 80  Temp: 98.2 F (36.8 C)     Physical Exam  Constitutional: He is oriented to person, place, and time. He appears well-developed and well-nourished.  HENT:  Head: Normocephalic and atraumatic.  Right Ear: Hearing and external ear normal.  Left Ear: Hearing and external ear normal.   Nose: Nose normal.  Mouth/Throat: Oropharynx is clear and moist.  Eyes: Conjunctivae and EOM are normal. Pupils are equal, round, and reactive to light.  Neck: Normal range of motion. Neck supple. No JVD present. No thyromegaly present.  Cardiovascular: Normal rate, regular rhythm and normal heart sounds.   Pulmonary/Chest: Effort normal and breath sounds normal. He has no wheezes. He has no rales.  Abdominal: Soft. Bowel sounds are normal. He exhibits no distension. There is no tenderness.  Musculoskeletal: Normal range of motion.  Lymphadenopathy:    He has no cervical adenopathy.  Neurological: He is alert and oriented to person, place, and time. No sensory deficit. He exhibits normal muscle tone.  Skin: Skin is warm and dry. Capillary refill takes less than 2 seconds.  Psychiatric: He has a normal mood and affect. His behavior is normal.  Vitals reviewed.  ASSESSMENT & PLAN: Amitoj was seen today for cough and dizziness.  Diagnoses and all orders for this visit:  Influenza with respiratory manifestation other than pneumonia  Cough  Other orders -     oseltamivir (TAMIFLU) 75 MG capsule; Take 1 capsule (75 mg total) by mouth 2 (two) times daily. -     promethazine-codeine (PHENERGAN WITH CODEINE) 6.25-10 MG/5ML syrup; Take 5 mLs by mouth at bedtime as needed for cough.    Patient Instructions       IF you received an x-ray today, you will receive an invoice from [facility]. Please contact [facility] at [phone] with questions or concerns regarding your invoice.   IF you received labwork today, you will receive an invoice from [facility]. Please contact [facility] at [phone] with questions or concerns regarding your invoice.   Our billing staff will not be able to assist you with questions regarding bills from these companies.  You will be contacted with the lab results as soon as they are available. The fastest way to get your results is to  activate your My Chart account. Instructions are located on the last page of this paperwork. If you have not heard from [place] regarding the results in 2 weeks, please contact this office.      Influenza, Adult Influenza ("the flu") is an infection in the lungs, nose, and throat (respiratory tract). It is caused by a virus. The flu causes many common cold symptoms, as well as a high fever and body aches. It can make you feel very sick. The flu spreads easily from person to person (is contagious). Getting a flu shot (influenza vaccination) every year is the best way to prevent the flu. Follow these instructions at home:  Take over-the-counter and prescription medicines only as told by your doctor.  Use a cool mist humidifier to add moisture (humidity) to the air in your home. This can make it easier to breathe.  Rest as needed.  Drink enough fluid to keep your pee (urine) clear or pale yellow.  Cover your mouth and nose when you cough or sneeze.  Wash your hands with soap and water often, especially after you cough or sneeze. If you cannot use soap and water, use hand sanitizer.  Stay home from work or school as told by your doctor. Unless you are visiting your doctor, try to avoid leaving home until your fever has been gone for 24 hours without the use of medicine.  Keep all follow-up visits as told by your doctor. This is important. How is this prevented?  Getting a yearly (annual) flu shot is the best way to avoid getting the flu. You may get the flu shot in late summer, fall, or winter. Ask your doctor when you should get your flu shot.  Wash your hands often or use hand sanitizer often.  Avoid contact with people who are sick during cold and flu season.  Eat healthy foods.  Drink plenty of fluids.  Get enough sleep.  Exercise regularly. Contact a doctor if:  You get new symptoms.  You have:  Chest pain.  Watery poop (diarrhea).  A fever.  Your cough gets  worse.  You start to have more mucus.  You feel sick to your stomach (nauseous).  You throw up (vomit). Get help right away if:  You start to be short of breath or have trouble breathing.  Your skin or nails turn a bluish color.  You have very bad pain or stiffness in  your neck.  You get a sudden headache.  You get sudden pain in your face or ear.  You cannot stop throwing up. This information is not intended to replace advice given to you by your health care provider. Make sure you discuss any questions you have with your health care provider. Document Released: 10/19/2007 Document Revised: 06/17/2015 Document Reviewed: 11/03/2014 Elsevier Interactive Patient Education  2017 Elsevier Inc.      Agustina Caroli, MD [facility]

## 2016-03-06 NOTE — Patient Instructions (Addendum)
     IF you received an x-ray today, you will receive an invoice from [facility]. Please contact [facility] at [phone] with questions or concerns regarding your invoice.   IF you received labwork today, you will receive an invoice from [facility]. Please contact [facility] at [phone] with questions or concerns regarding your invoice.   Our billing staff will not be able to assist you with questions regarding bills from these companies.  You will be contacted with the lab results as soon as they are available. The fastest way to get your results is to activate your My Chart account. Instructions are located on the last page of this paperwork. If you have not heard from us regarding the results in 2 weeks, please contact this office.      Influenza, Adult Influenza ("the flu") is an infection in the lungs, nose, and throat (respiratory tract). It is caused by a virus. The flu causes many common cold symptoms, as well as a high fever and body aches. It can make you feel very sick. The flu spreads easily from person to person (is contagious). Getting a flu shot (influenza vaccination) every year is the best way to prevent the flu. Follow these instructions at home:  Take over-the-counter and prescription medicines only as told by your doctor.  Use a cool mist humidifier to add moisture (humidity) to the air in your home. This can make it easier to breathe.  Rest as needed.  Drink enough fluid to keep your pee (urine) clear or pale yellow.  Cover your mouth and nose when you cough or sneeze.  Wash your hands with soap and water often, especially after you cough or sneeze. If you cannot use soap and water, use hand sanitizer.  Stay home from work or school as told by your doctor. Unless you are visiting your doctor, try to avoid leaving home until your fever has been gone for 24 hours without the use of medicine.  Keep all follow-up visits as told by your doctor.  This is important. How is this prevented?  Getting a yearly (annual) flu shot is the best way to avoid getting the flu. You may get the flu shot in late summer, fall, or winter. Ask your doctor when you should get your flu shot.  Wash your hands often or use hand sanitizer often.  Avoid contact with people who are sick during cold and flu season.  Eat healthy foods.  Drink plenty of fluids.  Get enough sleep.  Exercise regularly. Contact a doctor if:  You get new symptoms.  You have:  Chest pain.  Watery poop (diarrhea).  A fever.  Your cough gets worse.  You start to have more mucus.  You feel sick to your stomach (nauseous).  You throw up (vomit). Get help right away if:  You start to be short of breath or have trouble breathing.  Your skin or nails turn a bluish color.  You have very bad pain or stiffness in your neck.  You get a sudden headache.  You get sudden pain in your face or ear.  You cannot stop throwing up. This information is not intended to replace advice given to you by your health care provider. Make sure you discuss any questions you have with your health care provider. Document Released: 10/19/2007 Document Revised: 06/17/2015 Document Reviewed: 11/03/2014 Elsevier Interactive Patient Education  2017 [employer] Inc.  

## 2016-03-08 ENCOUNTER — Encounter: Payer: Self-pay | Admitting: Emergency Medicine

## 2016-03-15 DIAGNOSIS — H2513 Age-related nuclear cataract, bilateral: Secondary | ICD-10-CM | POA: Diagnosis not present

## 2016-04-24 ENCOUNTER — Other Ambulatory Visit: Payer: Self-pay | Admitting: Family Medicine

## 2016-05-11 DIAGNOSIS — Z8546 Personal history of malignant neoplasm of prostate: Secondary | ICD-10-CM | POA: Diagnosis not present

## 2016-05-16 DIAGNOSIS — N481 Balanitis: Secondary | ICD-10-CM | POA: Diagnosis not present

## 2016-05-16 DIAGNOSIS — C61 Malignant neoplasm of prostate: Secondary | ICD-10-CM | POA: Diagnosis not present

## 2016-05-16 DIAGNOSIS — N5201 Erectile dysfunction due to arterial insufficiency: Secondary | ICD-10-CM | POA: Diagnosis not present

## 2016-05-16 DIAGNOSIS — R350 Frequency of micturition: Secondary | ICD-10-CM | POA: Diagnosis not present

## 2016-07-24 DIAGNOSIS — H0012 Chalazion right lower eyelid: Secondary | ICD-10-CM | POA: Diagnosis not present

## 2016-08-16 ENCOUNTER — Other Ambulatory Visit: Payer: Self-pay | Admitting: Family Medicine

## 2016-08-21 DIAGNOSIS — H0012 Chalazion right lower eyelid: Secondary | ICD-10-CM | POA: Diagnosis not present

## 2016-09-01 DIAGNOSIS — H0012 Chalazion right lower eyelid: Secondary | ICD-10-CM | POA: Diagnosis not present

## 2016-09-12 DIAGNOSIS — K219 Gastro-esophageal reflux disease without esophagitis: Secondary | ICD-10-CM | POA: Diagnosis not present

## 2016-09-12 DIAGNOSIS — Z1211 Encounter for screening for malignant neoplasm of colon: Secondary | ICD-10-CM | POA: Diagnosis not present

## 2016-09-18 DIAGNOSIS — Z1212 Encounter for screening for malignant neoplasm of rectum: Secondary | ICD-10-CM | POA: Diagnosis not present

## 2016-09-18 DIAGNOSIS — Z1211 Encounter for screening for malignant neoplasm of colon: Secondary | ICD-10-CM | POA: Diagnosis not present

## 2016-09-26 ENCOUNTER — Ambulatory Visit (INDEPENDENT_AMBULATORY_CARE_PROVIDER_SITE_OTHER): Payer: Medicare Other

## 2016-09-26 VITALS — BP 170/92 | HR 71 | Temp 98.2°F | Ht 70.5 in | Wt 184.4 lb

## 2016-09-26 DIAGNOSIS — Z131 Encounter for screening for diabetes mellitus: Secondary | ICD-10-CM | POA: Diagnosis not present

## 2016-09-26 DIAGNOSIS — Z0000 Encounter for general adult medical examination without abnormal findings: Secondary | ICD-10-CM | POA: Diagnosis not present

## 2016-09-26 DIAGNOSIS — Z13220 Encounter for screening for lipoid disorders: Secondary | ICD-10-CM | POA: Diagnosis not present

## 2016-09-26 DIAGNOSIS — Z136 Encounter for screening for cardiovascular disorders: Secondary | ICD-10-CM

## 2016-09-26 NOTE — Patient Instructions (Addendum)
Oscar Smith , Thank you for taking time to come for your Medicare Wellness Visit. I appreciate your ongoing commitment to your health goals. Please review the following plan we discussed and let me know if I can assist you in the future.   Screening recommendations/referrals: Colonoscopy: up to date, next due 11/14/2021 Recommended yearly ophthalmology/optometry visit for glaucoma screening and checkup Recommended yearly dental visit for hygiene and checkup  Vaccinations: Influenza vaccine: declined today, you will come later and receive this vaccine Pneumococcal vaccine: up to date Tdap vaccine: up to date, next due 01/31/2023 Shingles vaccine: up to date   Advanced directives: Copy in chart  Conditions/risks identified: Please try to eat less so he can lose weight in your abdomen area like you stated. This will help significantly with you overall health.    Next appointment: schedule follow up appointment with pcp, 1 year for AWV  Preventive Care 50 Years and Older, Male Preventive care refers to lifestyle choices and visits with your health care provider that can promote health and wellness. What does preventive care include?  A yearly physical exam. This is also called an annual well check.  Dental exams once or twice a year.  Routine eye exams. Ask your health care provider how often you should have your eyes checked.  Personal lifestyle choices, including:  Daily care of your teeth and gums.  Regular physical activity.  Eating a healthy diet.  Avoiding tobacco and drug use.  Limiting alcohol use.  Practicing safe sex.  Taking low doses of aspirin every day.  Taking vitamin and mineral supplements as recommended by your health care provider. What happens during an annual well check? The services and screenings done by your health care provider during your annual well check will depend on your age, overall health, lifestyle risk factors, and family history of  disease. Counseling  Your health care provider may ask you questions about your:  Alcohol use.  Tobacco use.  Drug use.  Emotional well-being.  Home and relationship well-being.  Sexual activity.  Eating habits.  History of falls.  Memory and ability to understand (cognition).  Work and work Statistician. Screening  You may have the following tests or measurements:  Height, weight, and BMI.  Blood pressure.  Lipid and cholesterol levels. These may be checked every 5 years, or more frequently if you are over 44 years old.  Skin check.  Lung cancer screening. You may have this screening every year starting at age 39 if you have a 30-pack-year history of smoking and currently smoke or have quit within the past 15 years.  Fecal occult blood test (FOBT) of the stool. You may have this test every year starting at age 90.  Flexible sigmoidoscopy or colonoscopy. You may have a sigmoidoscopy every 5 years or a colonoscopy every 10 years starting at age 45.  Prostate cancer screening. Recommendations will vary depending on your family history and other risks.  Hepatitis C blood test.  Hepatitis B blood test.  Sexually transmitted disease (STD) testing.  Diabetes screening. This is done by checking your blood sugar (glucose) after you have not eaten for a while (fasting). You may have this done every 1-3 years.  Abdominal aortic aneurysm (AAA) screening. You may need this if you are a current or former smoker.  Osteoporosis. You may be screened starting at age 64 if you are at high risk. Talk with your health care provider about your test results, treatment options, and if necessary,  the need for more tests. Vaccines  Your health care provider may recommend certain vaccines, such as:  Influenza vaccine. This is recommended every year.  Tetanus, diphtheria, and acellular pertussis (Tdap, Td) vaccine. You may need a Td booster every 10 years.  Zoster vaccine. You may  need this after age 90.  Pneumococcal 13-valent conjugate (PCV13) vaccine. One dose is recommended after age 66.  Pneumococcal polysaccharide (PPSV23) vaccine. One dose is recommended after age 12. Talk to your health care provider about which screenings and vaccines you need and how often you need them. This information is not intended to replace advice given to you by your health care provider. Make sure you discuss any questions you have with your health care provider. Document Released: 02/05/2015 Document Revised: 09/29/2015 Document Reviewed: 11/10/2014 Elsevier Interactive Patient Education  2017 [place] Prevention in the Home Falls can cause injuries. They can happen to people of all ages. There are many things you can do to make your home safe and to help prevent falls. What can I do on the outside of my home?  Regularly fix the edges of walkways and driveways and fix any cracks.  Remove anything that might make you trip as you walk through a door, such as a raised step or threshold.  Trim any bushes or trees on the path to your home.  Use bright outdoor lighting.  Clear any walking paths of anything that might make someone trip, such as rocks or tools.  Regularly check to see if handrails are loose or broken. Make sure that both sides of any steps have handrails.  Any raised decks and porches should have guardrails on the edges.  Have any leaves, snow, or ice cleared regularly.  Use sand or salt on walking paths during winter.  Clean up any spills in your garage right away. This includes oil or grease spills. What can I do in the bathroom?  Use night lights.  Install grab bars by the toilet and in the tub and shower. Do not use towel bars as grab bars.  Use non-skid mats or decals in the tub or shower.  If you need to sit down in the shower, use a plastic, non-slip stool.  Keep the floor dry. Clean up any water that spills on the floor as soon as it  happens.  Remove soap buildup in the tub or shower regularly.  Attach bath mats securely with double-sided non-slip rug tape.  Do not have throw rugs and other things on the floor that can make you trip. What can I do in the bedroom?  Use night lights.  Make sure that you have a light by your bed that is easy to reach.  Do not use any sheets or blankets that are too big for your bed. They should not hang down onto the floor.  Have a firm chair that has side arms. You can use this for support while you get dressed.  Do not have throw rugs and other things on the floor that can make you trip. What can I do in the kitchen?  Clean up any spills right away.  Avoid walking on wet floors.  Keep items that you use a lot in easy-to-reach places.  If you need to reach something above you, use a strong step stool that has a grab bar.  Keep electrical cords out of the way.  Do not use floor polish or wax that makes floors slippery. If you must use  wax, use non-skid floor wax.  Do not have throw rugs and other things on the floor that can make you trip. What can I do with my stairs?  Do not leave any items on the stairs.  Make sure that there are handrails on both sides of the stairs and use them. Fix handrails that are broken or loose. Make sure that handrails are as long as the stairways.  Check any carpeting to make sure that it is firmly attached to the stairs. Fix any carpet that is loose or worn.  Avoid having throw rugs at the top or bottom of the stairs. If you do have throw rugs, attach them to the floor with carpet tape.  Make sure that you have a light switch at the top of the stairs and the bottom of the stairs. If you do not have them, ask someone to add them for you. What else can I do to help prevent falls?  Wear shoes that:  Do not have high heels.  Have rubber bottoms.  Are comfortable and fit you well.  Are closed at the toe. Do not wear sandals.  If you  use a stepladder:  Make sure that it is fully opened. Do not climb a closed stepladder.  Make sure that both sides of the stepladder are locked into place.  Ask someone to hold it for you, if possible.  Clearly mark and make sure that you can see:  Any grab bars or handrails.  First and last steps.  Where the edge of each step is.  Use tools that help you move around (mobility aids) if they are needed. These include:  Canes.  Walkers.  Scooters.  Crutches.  Turn on the lights when you go into a dark area. Replace any light bulbs as soon as they burn out.  Set up your furniture so you have a clear path. Avoid moving your furniture around.  If any of your floors are uneven, fix them.  If there are any pets around you, be aware of where they are.  Review your medicines with your doctor. Some medicines can make you feel dizzy. This can increase your chance of falling. Ask your doctor what other things that you can do to help prevent falls. This information is not intended to replace advice given to you by your health care provider. Make sure you discuss any questions you have with your health care provider. Document Released: 11/05/2008 Document Revised: 06/17/2015 Document Reviewed: 02/13/2014 Elsevier Interactive Patient Education  2017 [employer].

## 2016-09-26 NOTE — Progress Notes (Signed)
Subjective:   Oscar Smith is a 71 y.o. male who presents for Medicare Annual/Subsequent preventive examination.  Review of Systems:  N/A Cardiac Risk Factors include: advanced age (>46mn, >>59women);male gender;sedentary lifestyle     Objective:    Vitals: BP (!) 170/92 (BP Location: Left Arm)   Pulse 71   Temp 98.2 F (36.8 C) (Oral)   Ht 5' 10.5" (1.791 m)   Wt 184 lb 6 oz (83.6 kg)   BMI 26.08 kg/m   Body mass index is 26.08 kg/m.  Tobacco History  Smoking Status  . Former Smoker  . Types: Cigarettes  . Quit date: 09/04/1965  Smokeless Tobacco  . Never Used    Comment: quit around 71yrs old.     Counseling given: Not Answered   Past Medical History:  Diagnosis Date  . Adenocarcinoma of prostate ([facility] urologist-  dr mTresa Moore-  dx Nov 2011--  T1c, Gleason 3+3,  PSA 4.2 (post IMRT 03/2010   Low risk recurrence, Biochemical control,  last PSA 0.30 on 05-28-2014  . BPH (benign prostatic hyperplasia)   . Diverticulitis   . ED (erectile dysfunction) of organic origin   . GERD (gastroesophageal reflux disease)   . Hiatal hernia   . History of colon polyps    2002 and 2003 hyperplastic  . History of gastritis   . Lower urinary tract symptoms (LUTS)   . Phimosis   . Wears hearing aid    bilateral   Past Surgical History:  Procedure Laterality Date  . ABDOMINAL HERNIA REPAIR    . CIRCUMCISION N/A 06/09/2015   Procedure: CIRCUMCISION ADULT WITH PENILE BLOCK;  Surgeon: TAlexis Frock MD;  [facility]  Service: Urology;  Laterality: N/A;  . COLONOSCOPY  last one 11-15-2011  . ENDOVENOUS ABLATION SAPHENOUS VEIN W/ LASER Left 02/10/2016   endovenous laser ablation left greater saphenous vein and stab phlebectomy left leg by TCurt JewsMD  . INGUINAL HERNIA REPAIR Right 12-23-2009  . PROSTATE SURGERY  2009  . SHOULDER ARTHROSCOPY WITH SUBACROMIAL DECOMPRESSION, ROTATOR CUFF REPAIR AND BICEP TENDON REPAIR Left 10-2014   Family  History  Problem Relation Age of Onset  . Stomach cancer Father   . Cancer Father [phone]      stomach versus colon cancer  . Pancreatic cancer Sister   . Cancer Sister [phone]      pancreatic cancer  . Cancer Brother        penile cancer   History  Sexual Activity  . Sexual activity: Yes  . Partners: Male    Outpatient Encounter Prescriptions as of 09/26/2016  Medication Sig  . fluticasone (FLONASE) 50 MCG/ACT nasal spray USE 2 SPRAYS IN EACH NOSTRIL ONCE A DAY.  . Multiple Vitamin (MULTIVITAMIN) tablet Take 1 tablet by mouth daily.  . Omega-3 Fatty Acids (FISH OIL OMEGA-3 PO) Take by mouth.  .Marland Kitchenomeprazole (PRILOSEC) 20 MG capsule Take 20 mg by mouth daily.  .Marland KitchenOVER THE COUNTER MEDICATION OTC Ultraflora taking prn for IB  . [DISCONTINUED] promethazine-codeine (PHENERGAN WITH CODEINE) 6.25-10 MG/5ML syrup Take 5 mLs by mouth at bedtime as needed for cough.   No facility-administered encounter medications on file as of 09/26/2016.     Activities of Daily Living In your present state of health, do you have any difficulty performing the following activities: 09/26/2016 09/26/2016  Hearing? Y Y  Comment patient wears hearing aids  Patient has hearing aids   Vision? N -  Difficulty concentrating or making decisions? Y -  Walking or climbing stairs? N -  Dressing or bathing? N -  Doing errands, shopping? N -  Preparing Food and eating ? N -  Using the Toilet? N -  In the past six months, have you accidently leaked urine? Y -  Comment urine leakage due to prostate issues -  Do you have problems with loss of bowel control? N -  Managing your Medications? N -  Managing your Finances? N -  Housekeeping or managing your Housekeeping? N -  Some recent data might be hidden    Patient Care Team: Wardell Honour, MD as PCP - General (Family Medicine) Wardell Honour, MD as Consulting Physician (Family Medicine) Juanita Craver, MD as Consulting Physician (Gastroenterology) Sharyne Peach, MD as  Consulting Physician (Ophthalmology) Donn Pierini Jenetta Downer, DMD as Consulting Physician (Dentistry)   Assessment:     Exercise Activities and Dietary recommendations Current Exercise Habits: The patient does not participate in regular exercise at present, Exercise limited by: None identified  Goals    . Reduce portion size          Patient states that he is going to try to eat less so he can lose weight in his abdomen area.       Fall Risk Fall Risk  09/26/2016 03/06/2016 01/05/2016 09/11/2015 06/22/2015  Falls in the past year? _0    Depression Screen PHQ 2/9 Scores 09/26/2016 03/06/2016 01/05/2016 09/11/2015  PHQ - 2 Score 0 0 0 0    Cognitive Function     6CIT Screen 09/26/2016  What Year? 0 points  What month? 0 points  What time? 0 points  Count back from 20 0 points  Months in reverse 2 points  Repeat phrase 0 points  Total Score 2    Immunization History  Administered Date(s) Administered  . Influenza Split 10/21/2010, 10/18/2011  . Influenza,inj,Quad PF,6+ Mos 11/01/2012, 09/25/2013, 11/02/2015  . MMR 02/13/2013  . Pneumococcal Conjugate-13 06/22/2015  . Pneumococcal Polysaccharide-23 08/21/2008  . Tdap 01/30/2013  . Zoster 10/21/2010   Screening Tests Health Maintenance  Topic Date Due  . INFLUENZA VACCINE  09/26/2017 (Originally 08/23/2016)  . COLONOSCOPY  11/14/2021  . TETANUS/TDAP  01/31/2023  . Hepatitis C Screening  Completed  . PNA vac Low Risk Adult  Completed      Plan:   I have personally reviewed and noted the following in the patient's chart:   . Medical and social history . Use of alcohol, tobacco or illicit drugs  . Current medications and supplements . Functional ability and status . Nutritional status . Physical activity . Advanced directives . List of other physicians . Hospitalizations, surgeries, and ER visits in previous 12 months . Vitals . Screenings to include cognitive, depression, and falls . Referrals and  appointments  In addition, I have reviewed and discussed with patient certain preventive protocols, quality metrics, and best practice recommendations. A written personalized care plan for preventive services as well as general preventive health recommendations were provided to patient.  Blood work ordered during visit.    Andrez Grime, LPN  09/29/7891

## 2016-10-03 ENCOUNTER — Other Ambulatory Visit: Payer: Self-pay | Admitting: Family Medicine

## 2016-10-04 ENCOUNTER — Encounter: Payer: Self-pay | Admitting: Family Medicine

## 2016-10-04 ENCOUNTER — Ambulatory Visit (INDEPENDENT_AMBULATORY_CARE_PROVIDER_SITE_OTHER): Payer: Medicare Other | Admitting: Family Medicine

## 2016-10-04 VITALS — BP 150/82 | HR 62 | Temp 97.8°F | Resp 17 | Ht 70.5 in | Wt 181.2 lb

## 2016-10-04 DIAGNOSIS — K219 Gastro-esophageal reflux disease without esophagitis: Secondary | ICD-10-CM | POA: Diagnosis not present

## 2016-10-04 DIAGNOSIS — J301 Allergic rhinitis due to pollen: Secondary | ICD-10-CM

## 2016-10-04 DIAGNOSIS — Z23 Encounter for immunization: Secondary | ICD-10-CM

## 2016-10-04 DIAGNOSIS — C61 Malignant neoplasm of prostate: Secondary | ICD-10-CM

## 2016-10-04 DIAGNOSIS — E7800 Pure hypercholesterolemia, unspecified: Secondary | ICD-10-CM | POA: Diagnosis not present

## 2016-10-04 DIAGNOSIS — R339 Retention of urine, unspecified: Secondary | ICD-10-CM

## 2016-10-04 DIAGNOSIS — I10 Essential (primary) hypertension: Secondary | ICD-10-CM

## 2016-10-04 DIAGNOSIS — N5203 Combined arterial insufficiency and corporo-venous occlusive erectile dysfunction: Secondary | ICD-10-CM

## 2016-10-04 LAB — POCT URINALYSIS DIP (MANUAL ENTRY)
Bilirubin, UA: NEGATIVE
Blood, UA: NEGATIVE
Glucose, UA: NEGATIVE mg/dL
Ketones, POC UA: NEGATIVE mg/dL
Leukocytes, UA: NEGATIVE
Nitrite, UA: NEGATIVE
Spec Grav, UA: 1.015 (ref 1.010–1.025)
Urobilinogen, UA: 0.2 E.U./dL
pH, UA: 7.5 (ref 5.0–8.0)

## 2016-10-04 MED ORDER — LISINOPRIL 10 MG PO TABS: 10.0000 mg | ORAL_TABLET | Freq: Every day | ORAL | 1 refills | Status: DC

## 2016-10-04 MED ORDER — ZOSTER VAC RECOMB ADJUVANTED 50 MCG/0.5ML IM SUSR: 0.5000 mL | Freq: Once | INTRAMUSCULAR | 1 refills | Status: AC

## 2016-10-04 NOTE — Progress Notes (Signed)
Subjective:    Patient ID: Oscar Smith, male    DOB: Jan 13, 1946, 71 y.o.   MRN: [id number]  10/04/2016  Hypertension (follow up)   HPI This 71 y.o. male presents for evaluation of hypertension new onset.  Last visit in 05/2015.  Underwent AWV on 09/26/16 and blood pressure elevated at that time.   Blood pressure has been elevated for six months.  138-160/78-98.  Trying to walk more and exercise more.  Trying to walk atleast three times per week.  Only exercising one mile.  Started exercising.  Inconsistent with walking.    Went to [place] in May 2018; bikes a lot.    Last physical:  09-26-16 Colonoscopy:  2013; repeat hemosure with Mann three weeks ago.   PSA:  prostate cancer; followed by urology every year.    Eye exam:  +glasses; recent visit.    BP Readings from Last 3 Encounters:  10/04/16 (!) 150/82  09/26/16 (!) 170/92  03/06/16 (!) 118/56   Wt Readings from Last 3 Encounters:  10/04/16 181 lb 3.2 oz (82.2 kg)  09/26/16 184 lb 6 oz (83.6 kg)  03/06/16 185 lb (83.9 kg)   Immunization History  Administered Date(s) Administered  . Influenza Split 10/21/2010, 10/18/2011  . Influenza,inj,Quad PF,6+ Mos 11/01/2012, 09/25/2013, 11/02/2015  . MMR 02/13/2013  . Pneumococcal Conjugate-13 06/22/2015  . Pneumococcal Polysaccharide-23 08/21/2008  . Tdap 01/30/2013  . Zoster 10/21/2010    Review of Systems  Constitutional: Negative for activity change, appetite change, chills, diaphoresis, fatigue, fever and unexpected weight change.  HENT: Positive for hearing loss. Negative for congestion, dental problem, drooling, ear discharge, ear pain, facial swelling, mouth sores, nosebleeds, postnasal drip, rhinorrhea, sinus pressure, sneezing, sore throat, tinnitus, trouble swallowing and voice change.   Eyes: Negative for photophobia, pain, discharge, redness, itching and visual disturbance.  Respiratory: Negative for apnea, cough, choking, chest tightness, shortness of breath,  wheezing and stridor.   Cardiovascular: Negative for chest pain, palpitations and leg swelling.  Gastrointestinal: Negative for abdominal pain, blood in stool, constipation, diarrhea, nausea and vomiting.  Endocrine: Negative for cold intolerance, heat intolerance, polydipsia, polyphagia and polyuria.  Genitourinary: Negative for decreased urine volume, difficulty urinating, discharge, dysuria, enuresis, flank pain, frequency, genital sores, hematuria, penile pain, penile swelling, scrotal swelling, testicular pain and urgency.  Musculoskeletal: Negative for arthralgias, back pain, gait problem, joint swelling, myalgias, neck pain and neck stiffness.  Skin: Negative for color change, pallor, rash and wound.  Allergic/Immunologic: Negative for environmental allergies, food allergies and immunocompromised state.  Neurological: Negative for dizziness, tremors, seizures, syncope, facial asymmetry, speech difficulty, weakness, light-headedness, numbness and headaches.  Hematological: Negative for adenopathy. Does not bruise/bleed easily.  Psychiatric/Behavioral: Negative for agitation, behavioral problems, confusion, decreased concentration, dysphoric mood, hallucinations, self-injury, sleep disturbance and suicidal ideas. The patient is not nervous/anxious and is not hyperactive.        Bedtime 1000; wakes up 0500.    Past Medical History:  Diagnosis Date  . Adenocarcinoma of prostate [facility]) urologist-  dr Tresa Moore--  dx Nov 2011--  T1c, Gleason 3+3,  PSA 4.2 (post IMRT 03/2010   Low risk recurrence, Biochemical control,  last PSA 0.30 on 05-28-2014  . BPH (benign prostatic hyperplasia)   . Diverticulitis   . ED (erectile dysfunction) of organic origin   . GERD (gastroesophageal reflux disease)   . Hiatal hernia   . History of colon polyps    2002 and 2003 hyperplastic  . History of gastritis   .  Hypertension   . Lower urinary tract symptoms (LUTS)   . Phimosis   . Wears hearing aid     bilateral   Past Surgical History:  Procedure Laterality Date  . ABDOMINAL HERNIA REPAIR    . CIRCUMCISION N/A 06/09/2015   Procedure: CIRCUMCISION ADULT WITH PENILE BLOCK;  Surgeon: Alexis Frock, MD;  Location: [facility];  Service: Urology;  Laterality: N/A;  . COLONOSCOPY  last one 11-15-2011  . ENDOVENOUS ABLATION SAPHENOUS VEIN W/ LASER Left 02/10/2016   endovenous laser ablation left greater saphenous vein and stab phlebectomy left leg by Curt Jews MD  . INGUINAL HERNIA REPAIR Right 12-23-2009  . PROSTATE SURGERY  2009  . SHOULDER ARTHROSCOPY WITH SUBACROMIAL DECOMPRESSION, ROTATOR CUFF REPAIR AND BICEP TENDON REPAIR Left 10-2014   Allergies  Allergen Reactions  . Flomax [Tamsulosin Hcl]     "bad reaction" jaw pain   Current Outpatient Prescriptions  Medication Sig Dispense Refill  . fluticasone (FLONASE) 50 MCG/ACT nasal spray USE 2 SPRAYS IN EACH NOSTRIL ONCE A DAY. 16 g 0  . Multiple Vitamin (MULTIVITAMIN) tablet Take 1 tablet by mouth daily.    . Omega-3 Fatty Acids (FISH OIL OMEGA-3 PO) Take by mouth.    Marland Kitchen omeprazole (PRILOSEC) 20 MG capsule Take 20 mg by mouth daily.    Marland Kitchen OVER THE COUNTER MEDICATION OTC Ultraflora taking prn for IB    . lisinopril (PRINIVIL,ZESTRIL) 10 MG tablet Take 1 tablet (10 mg total) by mouth daily. 90 tablet 1   No current facility-administered medications for this visit.    Social History   Social History  . Marital status: Married    Spouse name: N/A  . Number of children: 0  . Years of education: BS   Occupational History  . [employer]    Social History Main Topics  . Smoking status: Former Smoker    Types: Cigarettes    Quit date: 09/04/1965  . Smokeless tobacco: Never Used     Comment: quit around 71 yrs old.  . Alcohol use 2.4 oz/week    2 Glasses of wine, 2 Cans of beer per week  . Drug use: No  . Sexual activity: Yes    Partners: Male   Other Topics Concern  . Not on file   Social History  Narrative   Originally from [place].   Trained as an Chief Financial Officer.    Married '75- 82 years, divorced; Married '86. No children.    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 Sculptor-life size figures by commission (see work in [place]  Sculpting work regularly.   Never smoking.   Alcohol: drinks 2 beers and 2-3 wines every night;    Learning the piano. Patient get regular exercise.   Wife is a Marine scientist.   Caffeine: 3/day    ADLs: drives; independent with ADLs   Advanced Directives: FULL CODE; no prolonged measures.         Family History  Problem Relation Age of Onset  . Stomach cancer Father   . Cancer Father 8       stomach versus colon cancer  . Pancreatic cancer Sister   . Cancer Sister 73       pancreatic cancer  . Cancer Brother        penile cancer       Objective:    BP (!) 150/82   Pulse 62   Temp 97.8  F (36.6 C) (Oral)   Resp 17   Ht 5' 10.5" (1.791 m)   Wt 181 lb 3.2 oz (82.2 kg)   SpO2 98%   BMI 25.63 kg/m  Physical Exam  Constitutional: He is oriented to person, place, and time. He appears well-developed and well-nourished. No distress.  HENT:  Head: Normocephalic and atraumatic.  Right Ear: External ear normal.  Left Ear: External ear normal.  Nose: Nose normal.  Mouth/Throat: Oropharynx is clear and moist.  Eyes: Pupils are equal, round, and reactive to light. Conjunctivae and EOM are normal.  Neck: Normal range of motion. Neck supple. Carotid bruit is not present. No thyromegaly present.  Cardiovascular: Normal rate, regular rhythm, normal heart sounds and intact distal pulses.  Exam reveals no gallop and no friction rub.   No murmur heard. Pulmonary/Chest: Effort normal and breath sounds normal. He has no wheezes. He has no rales.  Abdominal: Soft. Bowel sounds are normal. He exhibits no distension and no mass. There is no tenderness. There is no rebound and no  guarding.  Musculoskeletal:       Right shoulder: Normal.       Left shoulder: Normal.       Cervical back: Normal.  Lymphadenopathy:    He has no cervical adenopathy.  Neurological: He is alert and oriented to person, place, and time. He has normal reflexes. No cranial nerve deficit. He exhibits normal muscle tone. Coordination normal.  Skin: Skin is warm and dry. No rash noted. He is not diaphoretic.  Psychiatric: He has a normal mood and affect. His behavior is normal. Judgment and thought content normal.    No results found. Depression screen [facility] 2/9 10/04/2016 09/26/2016 03/06/2016 01/05/2016 09/11/2015  Decreased Interest 0 0 0 0 0  Down, Depressed, Hopeless 0 0 0 0 0  PHQ - 2 Score 0 0 0 0 0   Fall Risk  10/04/2016 09/26/2016 03/06/2016 01/05/2016 09/11/2015  Falls in the past year? No No No No No        Assessment & Plan:   1. Essential hypertension, benign   2. Seasonal allergic rhinitis due to pollen   3. Gastroesophageal reflux disease without esophagitis   4. ADENOCARCINOMA, PROSTATE, GLEASON GRADE 6   5. Combined arterial insufficiency and corporo-venous occlusive erectile dysfunction   6. Incomplete bladder emptying   7. Need for shingles vaccine   8. Pure hypercholesterolemia    -new onset hypertension; rx for Lisinopril 63m daily provided; obtain labs and EKG for new onset hypertension.  -history of prostate cancer; s/p prostatectomy; suffers with urinary incontinence.   Orders Placed This Encounter  Procedures  . CBC with Differential/Platelet  . Comprehensive metabolic panel    Order Specific Question:   Has the patient fasted?    Answer:   Yes  . Lipid panel    Order Specific Question:   Has the patient fasted?    Answer:   Yes  . POCT urinalysis dipstick  . EKG 12-Lead   Meds ordered this encounter  Medications  . Zoster Vac Recomb Adjuvanted ([facility] injection    Sig: Inject 0.5 mLs into the muscle once.    Dispense:  0.5 mL    Refill:  1  .  lisinopril (PRINIVIL,ZESTRIL) 10 MG tablet    Sig: Take 1 tablet (10 mg total) by mouth daily.    Dispense:  90 tablet    Refill:  1    Return in about 6 weeks (around 11/15/2016) for recheck blood  pressure.   Rashi Granier Elayne Guerin, M.D. Primary Care at [facility] previously [facility] [place] [address] [place], [place]  [address] [phone] phone [phone] fax

## 2016-10-04 NOTE — Patient Instructions (Addendum)
Please check to see if you have a LIVING WILL OR ADVANCED DIRECTIVES. Recommend ASPIRIN 81MG ENTERIC COATED ONE TABLET DAILY.  IF you received an x-ray today, you will receive an invoice from [facility]. Please contact [facility] at [phone] with questions or concerns regarding your invoice.   IF you received labwork today, you will receive an invoice from [facility]. Please contact [facility] at [phone] with questions or concerns regarding your invoice.   Our billing staff will not be able to assist you with questions regarding bills from these companies.  You will be contacted with the lab results as soon as they are available. The fastest way to get your results is to activate your My Chart account. Instructions are located on the last page of this paperwork. If you have not heard from [place] regarding the results in 2 weeks, please contact this office.     Preventive Care 23 Years and Older, Male Preventive care refers to lifestyle choices and visits with your health care provider that can promote health and wellness. What does preventive care include?  A yearly physical exam. This is also called an annual well check.  Dental exams once or twice a year.  Routine eye exams. Ask your health care provider how often you should have your eyes checked.  Personal lifestyle choices, including: ? Daily care of your teeth and gums. ? Regular physical activity. ? Eating a healthy diet. ? Avoiding tobacco and drug use. ? Limiting alcohol use. ? Practicing safe sex. ? Taking low doses of aspirin every day. ? Taking vitamin and mineral supplements as recommended by your health care provider. What happens during an annual well check? The services and screenings done by your health care provider during your annual well check will depend on your age, overall health, lifestyle risk factors, and family history of disease. Counseling Your health care provider may ask you  questions about your:  Alcohol use.  Tobacco use.  Drug use.  Emotional well-being.  Home and relationship well-being.  Sexual activity.  Eating habits.  History of falls.  Memory and ability to understand (cognition).  Work and work Statistician.  Screening You may have the following tests or measurements:  Height, weight, and BMI.  Blood pressure.  Lipid and cholesterol levels. These may be checked every 5 years, or more frequently if you are over 69 years old.  Skin check.  Lung cancer screening. You may have this screening every year starting at age 43 if you have a 30-pack-year history of smoking and currently smoke or have quit within the past 15 years.  Fecal occult blood test (FOBT) of the stool. You may have this test every year starting at age 53.  Flexible sigmoidoscopy or colonoscopy. You may have a sigmoidoscopy every 5 years or a colonoscopy every 10 years starting at age 48.  Prostate cancer screening. Recommendations will vary depending on your family history and other risks.  Hepatitis C blood test.  Hepatitis B blood test.  Sexually transmitted disease (STD) testing.  Diabetes screening. This is done by checking your blood sugar (glucose) after you have not eaten for a while (fasting). You may have this done every 1-3 years.  Abdominal aortic aneurysm (AAA) screening. You may need this if you are a current or former smoker.  Osteoporosis. You may be screened starting at age 75 if you are at high risk.  Talk with your health care provider about your test results, treatment options, and if necessary, the  need for more tests. Vaccines Your health care provider may recommend certain vaccines, such as:  Influenza vaccine. This is recommended every year.  Tetanus, diphtheria, and acellular pertussis (Tdap, Td) vaccine. You may need a Td booster every 10 years.  Varicella vaccine. You may need this if you have not been vaccinated.  Zoster  vaccine. You may need this after age 37.  Measles, mumps, and rubella (MMR) vaccine. You may need at least one dose of MMR if you were born in 1957 or later. You may also need a second dose.  Pneumococcal 13-valent conjugate (PCV13) vaccine. One dose is recommended after age 70.  Pneumococcal polysaccharide (PPSV23) vaccine. One dose is recommended after age 67.  Meningococcal vaccine. You may need this if you have certain conditions.  Hepatitis A vaccine. You may need this if you have certain conditions or if you travel or work in places where you may be exposed to hepatitis A.  Hepatitis B vaccine. You may need this if you have certain conditions or if you travel or work in places where you may be exposed to hepatitis B.  Haemophilus influenzae type b (Hib) vaccine. You may need this if you have certain risk factors.  Talk to your health care provider about which screenings and vaccines you need and how often you need them. This information is not intended to replace advice given to you by your health care provider. Make sure you discuss any questions you have with your health care provider. Document Released: 02/05/2015 Document Revised: 09/29/2015 Document Reviewed: 11/10/2014 Elsevier Interactive Patient Education  2017 [employer].

## 2016-10-05 LAB — COMPREHENSIVE METABOLIC PANEL
ALT: 21 IU/L (ref 0–44)
AST: 24 IU/L (ref 0–40)
Albumin/Globulin Ratio: 1.7 (ref 1.2–2.2)
Albumin: 4.5 g/dL (ref 3.5–4.8)
Alkaline Phosphatase: 84 IU/L (ref 39–117)
BUN/Creatinine Ratio: 13 (ref 10–24)
BUN: 12 mg/dL (ref 8–27)
Bilirubin Total: 0.7 mg/dL (ref 0.0–1.2)
CO2: 23 mmol/L (ref 20–29)
Calcium: 9.3 mg/dL (ref 8.6–10.2)
Chloride: 105 mmol/L (ref 96–106)
Creatinine, Ser: 0.96 mg/dL (ref 0.76–1.27)
GFR calc Af Amer: 90 mL/min/{1.73_m2} (ref >59)
GFR calc non Af Amer: 78 mL/min/{1.73_m2} (ref >59)
Globulin, Total: 2.7 g/dL (ref 1.5–4.5)
Glucose: 85 mg/dL (ref 65–99)
Potassium: 4.2 mmol/L (ref 3.5–5.2)
Sodium: 142 mmol/L (ref 134–144)
Total Protein: 7.2 g/dL (ref 6.0–8.5)

## 2016-10-05 LAB — LIPID PANEL
Chol/HDL Ratio: 4.9 ratio (ref 0.0–5.0)
Cholesterol, Total: 211 mg/dL — ABNORMAL HIGH (ref 100–199)
HDL: 43 mg/dL (ref >39)
LDL Calculated: 141 mg/dL — ABNORMAL HIGH (ref 0–99)
Triglycerides: 134 mg/dL (ref 0–149)
VLDL Cholesterol Cal: 27 mg/dL (ref 5–40)

## 2016-10-05 LAB — CBC WITH DIFFERENTIAL/PLATELET
Basophils Absolute: 0 10*3/uL (ref 0.0–0.2)
Basos: 1 %
EOS (ABSOLUTE): 0.1 10*3/uL (ref 0.0–0.4)
Eos: 2 %
Hematocrit: 44.3 % (ref 37.5–51.0)
Hemoglobin: 14.6 g/dL (ref 13.0–17.7)
Immature Grans (Abs): 0 10*3/uL (ref 0.0–0.1)
Immature Granulocytes: 0 %
Lymphocytes Absolute: 1.7 10*3/uL (ref 0.7–3.1)
Lymphs: 35 %
MCH: 30.6 pg (ref 26.6–33.0)
MCHC: 33 g/dL (ref 31.5–35.7)
MCV: 93 fL (ref 79–97)
Monocytes Absolute: 0.5 10*3/uL (ref 0.1–0.9)
Monocytes: 10 %
Neutrophils Absolute: 2.5 10*3/uL (ref 1.4–7.0)
Neutrophils: 52 %
Platelets: 220 10*3/uL (ref 150–379)
RBC: 4.77 x10E6/uL (ref 4.14–5.80)
RDW: 13.2 % (ref 12.3–15.4)
WBC: 4.9 10*3/uL (ref 3.4–10.8)

## 2016-10-07 MED ORDER — ATORVASTATIN CALCIUM 10 MG PO TABS: 10.0000 mg | ORAL_TABLET | Freq: Every day | ORAL | 1 refills | Status: DC

## 2016-10-07 NOTE — Addendum Note (Signed)
Addended by: Wardell Honour on: 10/07/2016 10:33 AM   Modules accepted: Orders

## 2016-10-10 ENCOUNTER — Encounter: Payer: Self-pay | Admitting: Family Medicine

## 2016-10-10 DIAGNOSIS — D170 Benign lipomatous neoplasm of skin and subcutaneous tissue of head, face and neck: Secondary | ICD-10-CM | POA: Diagnosis not present

## 2016-10-10 DIAGNOSIS — L738 Other specified follicular disorders: Secondary | ICD-10-CM | POA: Diagnosis not present

## 2016-10-23 DIAGNOSIS — R221 Localized swelling, mass and lump, neck: Secondary | ICD-10-CM | POA: Diagnosis not present

## 2016-11-13 ENCOUNTER — Ambulatory Visit: Payer: Medicare Other | Admitting: Family Medicine

## 2016-11-20 ENCOUNTER — Encounter: Payer: Self-pay | Admitting: Family Medicine

## 2016-11-20 ENCOUNTER — Ambulatory Visit (INDEPENDENT_AMBULATORY_CARE_PROVIDER_SITE_OTHER): Payer: Medicare Other | Admitting: Family Medicine

## 2016-11-20 ENCOUNTER — Other Ambulatory Visit: Payer: Self-pay | Admitting: Family Medicine

## 2016-11-20 VITALS — BP 132/72 | HR 70 | Temp 98.0°F | Resp 16 | Ht 70.47 in | Wt 184.0 lb

## 2016-11-20 DIAGNOSIS — I10 Essential (primary) hypertension: Secondary | ICD-10-CM | POA: Diagnosis not present

## 2016-11-20 DIAGNOSIS — E7800 Pure hypercholesterolemia, unspecified: Secondary | ICD-10-CM

## 2016-11-20 DIAGNOSIS — Z23 Encounter for immunization: Secondary | ICD-10-CM | POA: Diagnosis not present

## 2016-11-20 NOTE — Progress Notes (Signed)
Subjective:    Patient ID: Oscar Smith, male    DOB: [birth year], 71 y.o.   MRN: [id number]  11/20/2016  Hypertension (6 week follow-up ) and Hyperlipidemia    HPI This 71 y.o. male presents for evaluation of hypertension and hypercholesterolemia. Management changes at last visit included starting Lisinopril 11m daily. Also recommended Atorvastatin 143mdaily; pt declined statin therapy initially.  Decided to start statin therapy after visit.  Wife is an RNTherapist, sportschecked BP one week ago and diastolic 7000F   BP Readings from Last 3 Encounters:  11/20/16 132/72  10/04/16 (!) 150/82  09/26/16 (!) 170/92   Wt Readings from Last 3 Encounters:  11/20/16 184 lb (83.5 kg)  10/04/16 181 lb 3.2 oz (82.2 kg)  09/26/16 184 lb 6 oz (83.6 kg)   Immunization History  Administered Date(s) Administered  . Influenza Split 10/21/2010, 10/18/2011  . Influenza,inj,Quad PF,6+ Mos 11/01/2012, 09/25/2013, 11/02/2015, 11/20/2016  . MMR 02/13/2013  . Pneumococcal Conjugate-13 06/22/2015  . Pneumococcal Polysaccharide-23 08/21/2008  . Tdap 01/30/2013  . Zoster 10/21/2010    Review of Systems  Constitutional: Negative for activity change, appetite change, chills, diaphoresis, fatigue and fever.  Respiratory: Negative for cough and shortness of breath.   Cardiovascular: Negative for chest pain, palpitations and leg swelling.  Gastrointestinal: Negative for abdominal pain, diarrhea, nausea and vomiting.  Endocrine: Negative for cold intolerance, heat intolerance, polydipsia, polyphagia and polyuria.  Skin: Negative for color change, rash and wound.  Neurological: Negative for dizziness, tremors, seizures, syncope, facial asymmetry, speech difficulty, weakness, light-headedness, numbness and headaches.  Psychiatric/Behavioral: Negative for dysphoric mood and sleep disturbance. The patient is not nervous/anxious.     Past Medical History:  Diagnosis Date  . [facility]  dr maTresa Moore  dx Nov 2011--  T1c, Gleason 3+3,  PSA 4.2 (post IMRT 03/2010   Low risk recurrence, Biochemical control,  last PSA 0.30 on 05-28-2014  . BPH (benign prostatic hyperplasia)   . Diverticulitis   . ED (erectile dysfunction) of organic origin   . GERD (gastroesophageal reflux disease)   . Hiatal hernia   . History of colon polyps    2002 and 2003 hyperplastic  . History of gastritis   . Hypertension   . Lower urinary tract symptoms (LUTS)   . Phimosis   . Wears hearing aid    bilateral   Past Surgical History:  Procedure Laterality Date  . ABDOMINAL HERNIA REPAIR    . CIRCUMCISION N/A 06/09/2015   Procedure: CIRCUMCISION ADULT WITH PENILE BLOCK;  Surgeon: ThAlexis FrockMD;  [facility] Service: Urology;  Laterality: N/A;  . COLONOSCOPY  last one 11-15-2011  . ENDOVENOUS ABLATION SAPHENOUS VEIN W/ LASER Left 02/10/2016   endovenous laser ablation left greater saphenous vein and stab phlebectomy left leg by ToCurt JewsD  . INGUINAL HERNIA REPAIR Right 12-23-2009  . PROSTATE SURGERY  2009  . SHOULDER ARTHROSCOPY WITH SUBACROMIAL DECOMPRESSION, ROTATOR CUFF REPAIR AND BICEP TENDON REPAIR Left 10-2014   Allergies  Allergen Reactions  . Flomax [Tamsulosin Hcl]     "bad reaction" jaw pain   Current Outpatient Prescriptions on File Prior to Visit  Medication Sig Dispense Refill  . atorvastatin (LIPITOR) 10 MG tablet Take 1 tablet (10 mg total) by mouth daily. 90 tablet 1  . lisinopril (PRINIVIL,ZESTRIL) 10 MG tablet Take 1 tablet (10 mg total) by mouth daily. 90 tablet 1  . Multiple Vitamin (MULTIVITAMIN) tablet Take 1  tablet by mouth daily.    . Omega-3 Fatty Acids (FISH OIL OMEGA-3 PO) Take by mouth.    Marland Kitchen omeprazole (PRILOSEC) 20 MG capsule Take 20 mg by mouth daily.    Marland Kitchen OVER THE COUNTER MEDICATION OTC Ultraflora taking prn for IB     No current facility-administered medications on file prior to visit.    Social History    Social History  . Marital status: Married    Spouse name: N/A  . Number of children: 0  . Years of education: BS   Occupational History  . [employer]    Social History Main Topics  . Smoking status: Former Smoker    Types: Cigarettes    Quit date: 09/04/1965  . Smokeless tobacco: Never Used     Comment: quit around 71 yrs old.  . Alcohol use 1.8 oz/week    2 Glasses of wine, 1 Cans of beer per week  . Drug use: No  . Sexual activity: Yes    Partners: Male   Other Topics Concern  . Not on file   Social History Narrative   Originally from [place].   Trained as an Chief Financial Officer.    Married '75- 78 years, divorced; Married '86. No children.    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 Sculptor-life size figures by commission (see work in [place]  Sculpting work regularly.   Never smoking.   Alcohol: drinks 2 beers and 2-3 wines every night;    Learning the piano. Patient get regular exercise.   Wife is a Marine scientist.   Caffeine: 3/day    ADLs: drives; independent with ADLs   Advanced Directives: FULL CODE; no prolonged measures.         Family History  Problem Relation Age of Onset  . Stomach cancer Father   . Cancer Father 13       stomach versus colon cancer  . Pancreatic cancer Sister   . Cancer Sister 16       pancreatic cancer  . Cancer Brother        penile cancer       Objective:    BP 132/72   Pulse 70   Temp 98 F (36.7 C) (Oral)   Resp 16   Ht 5' 10.47" (1.79 m)   Wt 184 lb (83.5 kg)   SpO2 98%   BMI 26.05 kg/m  Physical Exam  Constitutional: He is oriented to person, place, and time. He appears well-developed and well-nourished. No distress.  HENT:  Head: Normocephalic and atraumatic.  Right Ear: External ear normal.  Left Ear: External ear normal.  Nose: Nose normal.  Mouth/Throat: Oropharynx is clear and moist.  Eyes: Pupils are equal, round, and  reactive to light. Conjunctivae and EOM are normal.  Neck: Normal range of motion. Neck supple. Carotid bruit is not present. No thyromegaly present.  Cardiovascular: Normal rate, regular rhythm, normal heart sounds and intact distal pulses.  Exam reveals no gallop and no friction rub.   No murmur heard. Pulmonary/Chest: Effort normal and breath sounds normal. He has no wheezes. He has no rales.  Abdominal: Soft. Bowel sounds are normal. He exhibits no distension and no mass. There is no tenderness. There is no rebound and no guarding.  Lymphadenopathy:    He has no cervical adenopathy.  Neurological: He is alert and oriented to person, place, and time. No cranial nerve deficit.  Skin: Skin  is warm and dry. No rash noted. He is not diaphoretic.  Psychiatric: He has a normal mood and affect. His behavior is normal.  Nursing note and vitals reviewed.  No results found. Depression screen [facility] 2/9 11/20/2016 10/04/2016 09/26/2016 03/06/2016 01/05/2016  Decreased Interest 0 0 0 0 0  Down, Depressed, Hopeless 0 0 0 0 0  PHQ - 2 Score 0 0 0 0 0   Fall Risk  11/20/2016 10/04/2016 09/26/2016 03/06/2016 01/05/2016  Falls in the past year? No No No No No        Assessment & Plan:   1. Essential hypertension   2. Pure hypercholesterolemia   3. Need for prophylactic vaccination and inoculation against influenza     -controlled hypertension; obtain labs; continue current medication. -uncontrolled new onset hypercholesterolemia.  Tolerating statin therapy; obtain labs.   Orders Placed This Encounter  Procedures  . Flu Vaccine QUAD 36+ mos IM  . Lipid panel  . Comprehensive metabolic panel   No orders of the defined types were placed in this encounter.   Return in about 6 months (around 05/21/2017) for follow-up chronic medical conditions.   Vianna Venezia Elayne Guerin, M.D. Primary Care at [facility] previously [facility] [place] [address] [place], [place]  [address] [phone] phone [phone] fax

## 2016-11-20 NOTE — H&P (Signed)
Subjective:     Patient ID: Oscar Smith is a 71 y.o. male.  HPI  Referred by Dr. Ronnald Ramp for evaluation mass neck present for over a year. Has had similar lesion or cyst chest removed. Notes mass has grown since onset and not tender with pressure from collars/shirts.   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 Notes has PCP f/u end of month to follow up on new BP medication and would like procedure following this.  Review of Systems  HENT: Positive for hearing loss and sinus pressure.    Remainder 12 point review negative    Objective:   Physical Exam  Constitutional: He is oriented to person, place, and time.  Cardiovascular: Normal rate and regular rhythm.   Pulmonary/Chest: Effort normal and breath sounds normal.  Neurological: He is alert and oriented to person, place, and time.   HEENT: posterior neck soft non compressible mass without punctum 4 x 6 cm, minimally mobile    Assessment:     Mass neck, suspect lipoma    Plan:     Suspect lipoma. Reviewed benign nature but will continue to grow. Now tender. Patient desires some type sedation. Plan OP surgery. Discussed sutures, scar long as lesions, scar maturation over months, risks recurrence, seroma, additional procedure pending pathology. Given size do not anticipate drain but location prone to seroma and may need aspiration.   Asked that he hold fish oil and ASA for week prior to surgery.   Irene Limbo, MD [facility] [phone], pin [phone]

## 2016-11-20 NOTE — Patient Instructions (Signed)
     IF you received an x-ray today, you will receive an invoice from [facility]. Please contact [facility] at [phone] with questions or concerns regarding your invoice.   IF you received labwork today, you will receive an invoice from [facility]. Please contact [facility] at [phone] with questions or concerns regarding your invoice.   Our billing staff will not be able to assist you with questions regarding bills from these companies.  You will be contacted with the lab results as soon as they are available. The fastest way to get your results is to activate your My Chart account. Instructions are located on the last page of this paperwork. If you have not heard from us regarding the results in 2 weeks, please contact this office.     

## 2016-11-21 LAB — COMPREHENSIVE METABOLIC PANEL
ALT: 35 IU/L (ref 0–44)
AST: 26 IU/L (ref 0–40)
Albumin/Globulin Ratio: 2.1 (ref 1.2–2.2)
Albumin: 4.7 g/dL (ref 3.5–4.8)
Alkaline Phosphatase: 86 IU/L (ref 39–117)
BUN/Creatinine Ratio: 11 (ref 10–24)
BUN: 11 mg/dL (ref 8–27)
Bilirubin Total: 0.6 mg/dL (ref 0.0–1.2)
CO2: 24 mmol/L (ref 20–29)
Calcium: 9.5 mg/dL (ref 8.6–10.2)
Chloride: 101 mmol/L (ref 96–106)
Creatinine, Ser: 0.97 mg/dL (ref 0.76–1.27)
GFR calc Af Amer: 89 mL/min/{1.73_m2} (ref >59)
GFR calc non Af Amer: 77 mL/min/{1.73_m2} (ref >59)
Globulin, Total: 2.2 g/dL (ref 1.5–4.5)
Glucose: 88 mg/dL (ref 65–99)
Potassium: 4.9 mmol/L (ref 3.5–5.2)
Sodium: 140 mmol/L (ref 134–144)
Total Protein: 6.9 g/dL (ref 6.0–8.5)

## 2016-11-21 LAB — LIPID PANEL
Chol/HDL Ratio: 3.6 ratio (ref 0.0–5.0)
Cholesterol, Total: 157 mg/dL (ref 100–199)
HDL: 44 mg/dL (ref >39)
LDL Calculated: 81 mg/dL (ref 0–99)
Triglycerides: 158 mg/dL — ABNORMAL HIGH (ref 0–149)
VLDL Cholesterol Cal: 32 mg/dL (ref 5–40)

## 2016-11-27 ENCOUNTER — Encounter (HOSPITAL_BASED_OUTPATIENT_CLINIC_OR_DEPARTMENT_OTHER): Payer: Self-pay | Admitting: *Deleted

## 2016-12-01 ENCOUNTER — Ambulatory Visit (HOSPITAL_BASED_OUTPATIENT_CLINIC_OR_DEPARTMENT_OTHER): Payer: Medicare Other | Admitting: Anesthesiology

## 2016-12-01 ENCOUNTER — Ambulatory Visit (HOSPITAL_BASED_OUTPATIENT_CLINIC_OR_DEPARTMENT_OTHER)
Admission: RE | Admit: 2016-12-01 | Discharge: 2016-12-01 | Disposition: A | Discharge status: Home / Self Care | Payer: Medicare Other | Source: Ambulatory Visit | Attending: Plastic Surgery | Admitting: Plastic Surgery

## 2016-12-01 ENCOUNTER — Encounter (HOSPITAL_BASED_OUTPATIENT_CLINIC_OR_DEPARTMENT_OTHER): Payer: Self-pay | Admitting: *Deleted

## 2016-12-01 ENCOUNTER — Encounter (HOSPITAL_BASED_OUTPATIENT_CLINIC_OR_DEPARTMENT_OTHER)
Admission: RE | Disposition: A | Discharge status: Home / Self Care | Payer: Self-pay | Source: Ambulatory Visit | Attending: Plastic Surgery

## 2016-12-01 DIAGNOSIS — D171 Benign lipomatous neoplasm of skin and subcutaneous tissue of trunk: Secondary | ICD-10-CM | POA: Diagnosis not present

## 2016-12-01 DIAGNOSIS — K219 Gastro-esophageal reflux disease without esophagitis: Secondary | ICD-10-CM | POA: Insufficient documentation

## 2016-12-01 DIAGNOSIS — R222 Localized swelling, mass and lump, trunk: Secondary | ICD-10-CM | POA: Diagnosis present

## 2016-12-01 DIAGNOSIS — Z87891 Personal history of nicotine dependence: Secondary | ICD-10-CM | POA: Diagnosis not present

## 2016-12-01 DIAGNOSIS — K449 Diaphragmatic hernia without obstruction or gangrene: Secondary | ICD-10-CM | POA: Diagnosis not present

## 2016-12-01 DIAGNOSIS — Z79899 Other long term (current) drug therapy: Secondary | ICD-10-CM | POA: Insufficient documentation

## 2016-12-01 DIAGNOSIS — Z7951 Long term (current) use of inhaled steroids: Secondary | ICD-10-CM | POA: Insufficient documentation

## 2016-12-01 DIAGNOSIS — I10 Essential (primary) hypertension: Secondary | ICD-10-CM | POA: Insufficient documentation

## 2016-12-01 DIAGNOSIS — G4730 Sleep apnea, unspecified: Secondary | ICD-10-CM | POA: Diagnosis not present

## 2016-12-01 DIAGNOSIS — D170 Benign lipomatous neoplasm of skin and subcutaneous tissue of head, face and neck: Secondary | ICD-10-CM | POA: Diagnosis not present

## 2016-12-01 DIAGNOSIS — Z7982 Long term (current) use of aspirin: Secondary | ICD-10-CM | POA: Diagnosis not present

## 2016-12-01 DIAGNOSIS — D1779 Benign lipomatous neoplasm of other sites: Secondary | ICD-10-CM | POA: Diagnosis not present

## 2016-12-01 HISTORY — DX: Sleep apnea, unspecified: G47.30

## 2016-12-01 HISTORY — PX: LIPOMA EXCISION: SHX5283

## 2016-12-01 SURGERY — EXCISION LIPOMA
Anesthesia: General | Site: Back

## 2016-12-01 MED ORDER — CHLORHEXIDINE GLUCONATE CLOTH 2 % EX PADS: 6.0000 | MEDICATED_PAD | Freq: Once | CUTANEOUS | Status: DC

## 2016-12-01 MED ORDER — EPHEDRINE 5 MG/ML INJ
INTRAVENOUS | Status: AC
  Filled 2016-12-01: qty 10

## 2016-12-01 MED ORDER — LACTATED RINGERS IV SOLN
INTRAVENOUS | Status: DC
  Administered 2016-12-01: 09:00:00 via INTRAVENOUS

## 2016-12-01 MED ORDER — MIDAZOLAM HCL 2 MG/2ML IJ SOLN
INTRAMUSCULAR | Status: AC
  Filled 2016-12-01: qty 2

## 2016-12-01 MED ORDER — CEFAZOLIN SODIUM-DEXTROSE 2-4 GM/100ML-% IV SOLN
INTRAVENOUS | Status: AC
  Filled 2016-12-01: qty 100

## 2016-12-01 MED ORDER — 0.9 % SODIUM CHLORIDE (POUR BTL) OPTIME
TOPICAL | Status: DC | PRN
  Administered 2016-12-01: 100 mL

## 2016-12-01 MED ORDER — HYDROCODONE-ACETAMINOPHEN 5-325 MG PO TABS: 1.0000 | ORAL_TABLET | ORAL | 0 refills | Status: DC | PRN

## 2016-12-01 MED ORDER — SCOPOLAMINE 1 MG/3DAYS TD PT72
MEDICATED_PATCH | TRANSDERMAL | Status: AC
  Filled 2016-12-01: qty 1

## 2016-12-01 MED ORDER — SCOPOLAMINE 1 MG/3DAYS TD PT72
1.0000 | MEDICATED_PATCH | Freq: Once | TRANSDERMAL | Status: DC | PRN
  Administered 2016-12-01: 1.5 mg via TRANSDERMAL

## 2016-12-01 MED ORDER — BUPIVACAINE-EPINEPHRINE 0.25% -1:200000 IJ SOLN
INTRAMUSCULAR | Status: DC | PRN
Start: 2016-12-01 — End: 2016-12-01
  Administered 2016-12-01: 10 mL

## 2016-12-01 MED ORDER — FENTANYL CITRATE (PF) 100 MCG/2ML IJ SOLN
50.0000 ug | INTRAMUSCULAR | Status: DC | PRN
  Administered 2016-12-01 (×2): 50 ug via INTRAVENOUS

## 2016-12-01 MED ORDER — DEXAMETHASONE SODIUM PHOSPHATE 4 MG/ML IJ SOLN
INTRAMUSCULAR | Status: DC | PRN
  Administered 2016-12-01: 10 mg via INTRAVENOUS

## 2016-12-01 MED ORDER — SUGAMMADEX SODIUM 200 MG/2ML IV SOLN
INTRAVENOUS | Status: DC | PRN
  Administered 2016-12-01: 200 mg via INTRAVENOUS

## 2016-12-01 MED ORDER — PROPOFOL 10 MG/ML IV BOLUS
INTRAVENOUS | Status: DC | PRN
  Administered 2016-12-01: 150 mg via INTRAVENOUS

## 2016-12-01 MED ORDER — FENTANYL CITRATE (PF) 100 MCG/2ML IJ SOLN
INTRAMUSCULAR | Status: AC
  Filled 2016-12-01: qty 2

## 2016-12-01 MED ORDER — ROCURONIUM BROMIDE 100 MG/10ML IV SOLN
INTRAVENOUS | Status: DC | PRN
  Administered 2016-12-01: 40 mg via INTRAVENOUS

## 2016-12-01 MED ORDER — MIDAZOLAM HCL 2 MG/2ML IJ SOLN
1.0000 mg | INTRAMUSCULAR | Status: DC | PRN
  Administered 2016-12-01: 1 mg via INTRAVENOUS

## 2016-12-01 MED ORDER — CEFAZOLIN SODIUM-DEXTROSE 2-4 GM/100ML-% IV SOLN: 2.0000 g | INTRAVENOUS | Status: DC

## 2016-12-01 MED ORDER — FENTANYL CITRATE (PF) 100 MCG/2ML IJ SOLN: 25.0000 ug | INTRAMUSCULAR | Status: DC | PRN

## 2016-12-01 MED ORDER — ONDANSETRON HCL 4 MG/2ML IJ SOLN: 4.0000 mg | Freq: Once | INTRAMUSCULAR | Status: DC | PRN

## 2016-12-01 SURGICAL SUPPLY — 75 items
ADH SKN CLS APL DERMABOND .7 (GAUZE/BANDAGES/DRESSINGS) ×1
APL SKNCLS STERI-STRIP NONHPOA (GAUZE/BANDAGES/DRESSINGS)
BENZOIN TINCTURE PRP APPL 2/3 (GAUZE/BANDAGES/DRESSINGS) IMPLANT
BLADE CLIPPER SURG (BLADE) ×2 IMPLANT
BLADE SURG 15 STRL LF DISP TIS (BLADE) ×1 IMPLANT
BLADE SURG 15 STRL SS (BLADE) ×3
BNDG CONFORM 2 STRL LF (GAUZE/BANDAGES/DRESSINGS) IMPLANT
BNDG ELASTIC 2X5.8 VLCR STR LF (GAUZE/BANDAGES/DRESSINGS) IMPLANT
CANISTER SUCT 1200ML W/VALVE (MISCELLANEOUS) IMPLANT
CHLORAPREP W/TINT 26ML (MISCELLANEOUS) ×3 IMPLANT
CLEANER CAUTERY TIP 5X5 PAD (MISCELLANEOUS) IMPLANT
CLOSURE WOUND 1/2 X4 (GAUZE/BANDAGES/DRESSINGS)
CORD BIPOLAR FORCEPS 12FT (ELECTRODE) IMPLANT
COVER BACK TABLE 60X90IN (DRAPES) ×3 IMPLANT
COVER MAYO STAND STRL (DRAPES) ×3 IMPLANT
DERMABOND ADVANCED (GAUZE/BANDAGES/DRESSINGS) ×2
DERMABOND ADVANCED .7 DNX12 (GAUZE/BANDAGES/DRESSINGS) IMPLANT
DRAIN JP 10F RND SILICONE (MISCELLANEOUS) IMPLANT
DRAPE LAPAROTOMY 100X72 PEDS (DRAPES) ×2 IMPLANT
DRAPE U-SHAPE 76X120 STRL (DRAPES) ×1 IMPLANT
ELECT COATED BLADE 2.86 ST (ELECTRODE) ×2 IMPLANT
ELECT NDL BLADE 2-5/6 (NEEDLE) ×1 IMPLANT
ELECT NEEDLE BLADE 2-5/6 (NEEDLE) ×3 IMPLANT
ELECT REM PT RETURN 9FT ADLT (ELECTROSURGICAL) ×3
ELECT REM PT RETURN 9FT PED (ELECTROSURGICAL)
ELECTRODE REM PT RETRN 9FT PED (ELECTROSURGICAL) IMPLANT
ELECTRODE REM PT RTRN 9FT ADLT (ELECTROSURGICAL) IMPLANT
EVACUATOR SILICONE 100CC (DRAIN) IMPLANT
GAUZE SPONGE 4X4 12PLY STRL LF (GAUZE/BANDAGES/DRESSINGS) IMPLANT
GAUZE XEROFORM 1X8 LF (GAUZE/BANDAGES/DRESSINGS) IMPLANT
GLOVE BIO SURGEON STRL SZ 6 (GLOVE) ×3 IMPLANT
GLOVE BIO SURGEON STRL SZ 6.5 (GLOVE) ×2 IMPLANT
GLOVE BIO SURGEONS STRL SZ 6.5 (GLOVE) ×1
GLOVE BIOGEL PI IND STRL 7.0 (GLOVE) IMPLANT
GLOVE BIOGEL PI INDICATOR 7.0 (GLOVE) ×4
GLOVE SURG SS PI 6.5 STRL IVOR (GLOVE) ×2 IMPLANT
GOWN STRL REUS W/ TWL LRG LVL3 (GOWN DISPOSABLE) ×2 IMPLANT
GOWN STRL REUS W/TWL LRG LVL3 (GOWN DISPOSABLE) ×9
NDL HYPO 25X1 1.5 SAFETY (NEEDLE) IMPLANT
NDL PRECISIONGLIDE 27X1.5 (NEEDLE) ×1 IMPLANT
NEEDLE HYPO 25X1 1.5 SAFETY (NEEDLE) IMPLANT
NEEDLE PRECISIONGLIDE 27X1.5 (NEEDLE) ×3 IMPLANT
NS IRRIG 1000ML POUR BTL (IV SOLUTION) ×2 IMPLANT
PACK BASIN DAY SURGERY FS (CUSTOM PROCEDURE TRAY) ×3 IMPLANT
PAD CLEANER CAUTERY TIP 5X5 (MISCELLANEOUS)
PENCIL BUTTON HOLSTER BLD 10FT (ELECTRODE) ×2 IMPLANT
RUBBERBAND STERILE (MISCELLANEOUS) IMPLANT
SHEET MEDIUM DRAPE 40X70 STRL (DRAPES) IMPLANT
SPONGE GAUZE 2X2 8PLY STER LF (GAUZE/BANDAGES/DRESSINGS)
SPONGE GAUZE 2X2 8PLY STRL LF (GAUZE/BANDAGES/DRESSINGS) IMPLANT
SPONGE LAP 18X18 X RAY DECT (DISPOSABLE) IMPLANT
STRIP CLOSURE SKIN 1/2X4 (GAUZE/BANDAGES/DRESSINGS) IMPLANT
SUCTION FRAZIER HANDLE 10FR (MISCELLANEOUS)
SUCTION TUBE FRAZIER 10FR DISP (MISCELLANEOUS) IMPLANT
SUT CHROMIC 4 0 P 3 18 (SUTURE) IMPLANT
SUT ETHILON 4 0 PS 2 18 (SUTURE) IMPLANT
SUT ETHILON 5 0 P 3 18 (SUTURE)
SUT MNCRL 6-0 UNDY P1 1X18 (SUTURE) ×1 IMPLANT
SUT MNCRL AB 4-0 PS2 18 (SUTURE) ×2 IMPLANT
SUT MON AB 5-0 P3 18 (SUTURE) IMPLANT
SUT MONOCRYL 6-0 P1 1X18 (SUTURE)
SUT NYLON ETHILON 5-0 P-3 1X18 (SUTURE) IMPLANT
SUT PDS AB 2-0 CT2 27 (SUTURE) ×2 IMPLANT
SUT PLAIN 5 0 P 3 18 (SUTURE) IMPLANT
SUT SILK 2 0 PERMA HAND 18 BK (SUTURE) IMPLANT
SUT VIC AB 3-0 FS2 27 (SUTURE) ×2 IMPLANT
SUT VIC AB 5-0 P-3 18X BRD (SUTURE) IMPLANT
SUT VIC AB 5-0 P3 18 (SUTURE)
SUT VICRYL 4-0 PS2 18IN ABS (SUTURE) ×1 IMPLANT
SYR BULB 3OZ (MISCELLANEOUS) ×2 IMPLANT
SYR CONTROL 10ML LL (SYRINGE) ×3 IMPLANT
TOWEL OR 17X24 6PK STRL BLUE (TOWEL DISPOSABLE) ×3 IMPLANT
TRAY DSU PREP LF (CUSTOM PROCEDURE TRAY) IMPLANT
TUBE CONNECTING 20'X1/4 (TUBING)
TUBE CONNECTING 20X1/4 (TUBING) IMPLANT

## 2016-12-01 NOTE — Interval H&P Note (Signed)
History and Physical Interval Note:  12/01/2016 8:51 AM  Oscar Smith  has presented today for surgery, with the diagnosis of Neck Lipoma  The various methods of treatment have been discussed with the patient and family. After consideration of risks, benefits and other options for treatment, the patient has consented to  Procedure(s): Horatio (N/A) as a surgical intervention .  The patient's history has been reviewed, patient examined, no change in status, stable for surgery.  I have reviewed the patient's chart and labs.  Questions were answered to the patient's satisfaction.     Sintia Mckissic

## 2016-12-01 NOTE — Op Note (Signed)
Operative Note   DATE OF OPERATION: 11.9.18  LOCATION: [facility]-outpatient  SURGICAL DIVISION: Plastic Surgery  PREOPERATIVE DIAGNOSES:  1. Soft tissue mass back  POSTOPERATIVE DIAGNOSES:  same  PROCEDURE:  Excision subfascial mass trunk 6 cm  SURGEON: Irene Limbo MD MBA  ASSISTANT: S Rayburn PA-C  ANESTHESIA:  General.   EBL: 10 ml  COMPLICATIONS: None immediate.   INDICATIONS FOR PROCEDURE:  The patient, Oscar Smith, is a 71 y.o. male born on 09/15/1945, is here for soft tissue mass upper back.   FINDINGS: Clinically lipoma excised.  DESCRIPTION OF PROCEDURE:  The patient's operative site was marked with the patient in the preoperative area. The patient was taken to the operating room. SCDs were placed and IV antibiotics were given. Following induction, the patient was placed in prone position. The patient's operative site was prepped and draped in a sterile fashion. A time out was performed and all information was confirmed to be correct. Local anesthetic infiltrated surrounding mass. Transverse incision made over mass. This was carried through subcutaneous tissue and superficial mass. Subfascial mass firm consistent with lipoma dissected free from adjacent tissue, diameter 6 cm. Wound irrigated and hemostasis obtained. Closure completed with 2-0 PDS to approximate superficial fascia and plicate this to base of wound. 3-0 vicryl used in dermis followed by 4-0 monocryl subcuticular skin closure. Tissue adhesive and dry dressing applied. Patient returned to supine position.   The patient was allowed to wake from anesthesia, extubated and taken to the recovery room in satisfactory condition.   SPECIMENS: back mass  DRAINS: none  Irene Limbo, MD [facility] [phone], pin [phone]

## 2016-12-01 NOTE — Anesthesia Preprocedure Evaluation (Addendum)
Anesthesia Evaluation  Patient identified by MRN, date of birth, ID band Patient awake    Reviewed: Allergy & Precautions, NPO status , Patient's Chart, lab work & pertinent test results  Airway Mallampati: II  TM Distance: <3 FB Neck ROM: Full    Dental  (+) Teeth Intact, Dental Advisory Given, Poor Dentition   Pulmonary sleep apnea , former smoker,    Pulmonary exam normal breath sounds clear to auscultation       Cardiovascular hypertension, Pt. on medications Normal cardiovascular exam Rhythm:Regular Rate:Normal     Neuro/Psych negative neurological ROS  negative psych ROS   GI/Hepatic Neg liver ROS, hiatal hernia, GERD  Medicated and Controlled,  Endo/Other  negative endocrine ROS  Renal/GU negative Renal ROS     Musculoskeletal negative musculoskeletal ROS (+)   Abdominal   Peds  Hematology negative hematology ROS (+)   Anesthesia Other Findings Day of surgery medications reviewed with the patient.  Reproductive/Obstetrics                            Anesthesia Physical Anesthesia Plan  ASA: II  Anesthesia Plan: General   Post-op Pain Management:    Induction: Intravenous  PONV Risk Score and Plan: 2 and Ondansetron and Dexamethasone  Airway Management Planned: Oral ETT  Additional Equipment:   Intra-op Plan:   Post-operative Plan: Extubation in OR  Informed Consent: I have reviewed the patients History and Physical, chart, labs and discussed the procedure including the risks, benefits and alternatives for the proposed anesthesia with the patient or authorized representative who has indicated his/her understanding and acceptance.   Dental advisory given  Plan Discussed with: CRNA  Anesthesia Plan Comments: (Risks/benefits of general anesthesia discussed with patient including risk of damage to teeth, lips, gum, and tongue, nausea/vomiting, allergic reactions to  medications, and the possibility of heart attack, stroke and death.  All patient questions answered.  Patient wishes to proceed.)        Anesthesia Quick Evaluation

## 2016-12-01 NOTE — Discharge Instructions (Signed)

## 2016-12-01 NOTE — Anesthesia Procedure Notes (Signed)
Procedure Name: Intubation Date/Time: 12/01/2016 9:30 AM Performed by: Willa Frater, CRNA Pre-anesthesia Checklist: Patient identified, Emergency Drugs available, Suction available and Patient being monitored Patient Re-evaluated:Patient Re-evaluated prior to induction Oxygen Delivery Method: Circle system utilized Preoxygenation: Pre-oxygenation with 100% oxygen Induction Type: IV induction Ventilation: Mask ventilation without difficulty Laryngoscope Size: Glidescope and 3 Tube type: Oral Tube size: 7.0 mm Number of attempts: 1 Airway Equipment and Method: Stylet,  Oral airway and Video-laryngoscopy Placement Confirmation: ETT inserted through vocal cords under direct vision,  positive ETCO2 and breath sounds checked- equal and bilateral Secured at: 22 cm Tube secured with: Tape Dental Injury: Teeth and Oropharynx as per pre-operative assessment  Difficulty Due To: Difficulty was anticipated, Difficult Airway- due to limited oral opening, Difficult Airway- due to anterior larynx and Difficult Airway- due to dentition

## 2016-12-01 NOTE — Transfer of Care (Signed)
Immediate Anesthesia Transfer of Care Note  Patient: Oscar Smith  Procedure(s) Performed: EXCISION POSTERIOR NECK MASS (N/A Back)  Patient Location: PACU  Anesthesia Type:General  Level of Consciousness: awake, alert , oriented and drowsy  Airway & Oxygen Therapy: Patient Spontanous Breathing and Patient connected to face mask oxygen  Post-op Assessment: Report given to RN and Post -op Vital signs reviewed and stable  Post vital signs: Reviewed and stable  Last Vitals:  Vitals:   12/01/16 0824  BP: (!) 154/87  Pulse: 65  Resp: 18  Temp: 36.5 C  SpO2: 99%    Last Pain:  Vitals:   12/01/16 0824  TempSrc: Oral         Complications: No apparent anesthesia complications

## 2016-12-01 NOTE — Anesthesia Postprocedure Evaluation (Signed)
Anesthesia Post Note  Patient: [place]  Procedure(s) Performed: EXCISION POSTERIOR NECK MASS (N/A Back)     Patient location during evaluation: PACU Anesthesia Type: General Level of consciousness: awake and alert Pain management: pain level controlled Vital Signs Assessment: post-procedure vital signs reviewed and stable Respiratory status: spontaneous breathing, nonlabored ventilation and respiratory function stable Cardiovascular status: blood pressure returned to baseline and stable Postop Assessment: no apparent nausea or vomiting Anesthetic complications: no    Last Vitals:  Vitals:   12/01/16 1045 12/01/16 1115  BP: (!) 157/86 (!) 161/86  Pulse: 68 63  Resp: 12 14  Temp:  (!) 36.3 C  SpO2: 99% 97%    Last Pain:  Vitals:   12/01/16 1115  TempSrc:   PainSc: 0-No pain                 Catalina Gravel

## 2016-12-04 ENCOUNTER — Encounter (HOSPITAL_BASED_OUTPATIENT_CLINIC_OR_DEPARTMENT_OTHER): Payer: Self-pay | Admitting: Plastic Surgery

## 2016-12-07 ENCOUNTER — Encounter: Payer: Self-pay | Admitting: Urgent Care

## 2016-12-07 ENCOUNTER — Ambulatory Visit (INDEPENDENT_AMBULATORY_CARE_PROVIDER_SITE_OTHER): Payer: Medicare Other

## 2016-12-07 ENCOUNTER — Other Ambulatory Visit: Payer: Self-pay

## 2016-12-07 ENCOUNTER — Ambulatory Visit (INDEPENDENT_AMBULATORY_CARE_PROVIDER_SITE_OTHER): Payer: Medicare Other | Admitting: Urgent Care

## 2016-12-07 VITALS — BP 159/86 | HR 78 | Temp 97.8°F | Resp 16 | Ht 70.0 in | Wt 184.0 lb

## 2016-12-07 DIAGNOSIS — M160 Bilateral primary osteoarthritis of hip: Secondary | ICD-10-CM | POA: Diagnosis not present

## 2016-12-07 DIAGNOSIS — M47816 Spondylosis without myelopathy or radiculopathy, lumbar region: Secondary | ICD-10-CM | POA: Diagnosis not present

## 2016-12-07 DIAGNOSIS — M25551 Pain in right hip: Secondary | ICD-10-CM | POA: Diagnosis not present

## 2016-12-07 DIAGNOSIS — M5441 Lumbago with sciatica, right side: Secondary | ICD-10-CM | POA: Diagnosis not present

## 2016-12-07 DIAGNOSIS — M1611 Unilateral primary osteoarthritis, right hip: Secondary | ICD-10-CM | POA: Diagnosis not present

## 2016-12-07 DIAGNOSIS — M545 Low back pain: Secondary | ICD-10-CM | POA: Diagnosis not present

## 2016-12-07 MED ORDER — METHYLPREDNISOLONE ACETATE 80 MG/ML IJ SUSP
80.0000 mg | Freq: Once | INTRAMUSCULAR | Status: AC
  Administered 2016-12-07: 80 mg via INTRAMUSCULAR

## 2016-12-07 MED ORDER — ACETAMINOPHEN-CODEINE #3 300-30 MG PO TABS: 1.0000 | ORAL_TABLET | Freq: Three times a day (TID) | ORAL | 0 refills | Status: DC | PRN

## 2016-12-07 NOTE — Progress Notes (Signed)
MRN: [id number] DOB: December 29, 1945  Subjective:   Oscar Smith is a 71 y.o. male presenting for 2 week history of right hip pain. Also can have low back pain, posterior right hip pain that radiates down toward lateral thigh into knee. Has tried APAP PM seldomly, does stretches for his low back which are no longer helping. Denies trauma, falls, injuries in the past, weakness, numbness, tingling, hematuria, saddle paraesthesia.  Oscar Smith has a current medication list which includes the following prescription(s): aspirin ec, atorvastatin, fluticasone, hydrocodone-acetaminophen, lisinopril, multivitamin, omega-3 fatty acids, omeprazole, and OVER THE COUNTER MEDICATION. Also is allergic to flomax [tamsulosin hcl].  Oscar Smith  has a past medical history of Adenocarcinoma of prostate [facility]) (urologist-  dr Tresa Moore--  dx Nov 2011--  T1c, Gleason 3+3,  PSA 4.2 (post IMRT 03/2010), BPH (benign prostatic hyperplasia), Diverticulitis, ED (erectile dysfunction) of organic origin, GERD (gastroesophageal reflux disease), Hiatal hernia, History of colon polyps, History of gastritis, Hypertension, Lower urinary tract symptoms (LUTS), Phimosis, Sleep apnea, and Wears hearing aid. Also  has a past surgical history that includes Abdominal hernia repair; Prostate surgery (2009); Colonoscopy (last one 11-15-2011); Inguinal hernia repair (Right, 12-23-2009); Shoulder arthroscopy with subacromial decompression, rotator cuff repair and bicep tendon repair (Left, 10-2014); Circumcision (N/A, 06/09/2015); Endovenous ablation saphenous vein w/ laser (Left, 02/10/2016); and Lipoma excision (N/A, 12/01/2016).  Objective:   Vitals: BP (!) 157/89   Pulse 78   Temp 97.8 F (36.6 C) (Oral)   Resp 16   Ht 5\' 10"  (1.778 m)   Wt 184 lb (83.5 kg)   SpO2 97%   BMI 26.40 kg/m   Physical Exam  Constitutional: He is oriented to person, place, and time. He appears well-developed and well-nourished.  Cardiovascular: Normal rate.    Pulmonary/Chest: Effort normal.  Musculoskeletal:       Right hip: He exhibits tenderness (over area depicted). He exhibits normal range of motion, normal strength, no bony tenderness, no swelling, no crepitus, no deformity and no laceration.       Lumbar back: He exhibits decreased range of motion (lateral flexion to the right) and tenderness (over areas depicted). He exhibits no bony tenderness, no swelling, no edema, no deformity, no laceration and no spasm.       Back:       Legs: Neurological: He is alert and oriented to person, place, and time. He displays normal reflexes.    Dg Hip Unilat W Or W/o Pelvis 2-3 Views Right  Result Date: 12/07/2016 CLINICAL DATA:  Hip pain.  History of prostate cancer. EXAM: DG HIP (WITH OR WITHOUT PELVIS) 2-3V RIGHT COMPARISON:  No prior. FINDINGS: Degenerative changes lumbar spine and both hips. Sclerotic changes noted about both femoral heads most likely secondary to degenerative change. Avascular necrosis cannot be excluded. No acute bony or joint abnormality identified. Surgical clips in the prostate bed. Pelvic calcifications noted consistent phleboliths. Small sclerotic density noted over the left iliac wing most likely a bone island. Further evaluation with bone scan can be obtained if needed. IMPRESSION: 1. Degenerative changes lumbar spine and both hips. No acute bony or joint abnormality identified. 2. Sclerotic changes both femoral heads, most likely from degenerative change. Bilateral femoral head avascular necrosis cannot be excluded . MRI of the hips can be obtained to further evaluate if need be . 3. Small sclerotic density noted on the the left iliac wing. This is most likely a bone island. Small blastic metastatic focus cannot be excluded. Further evaluation with bone scan  can be obtained if need be . Electronically Signed   By: Oscar Smith   On: 12/07/2016 12:12     Assessment and Plan :   1. Right hip pain 2. Acute right-sided low  back pain with right-sided sciatica 3. Bilateral hip joint arthritis 4. Arthritis of lumbar spine - IM Depomedrol due to severe pain. Tylenol #3 at home. Ortho referral pending to rule out avascular necrosis. Patient prefers to hold on on me ordering the MRI and would like to see the orthopedic surgeon first. Return-to-clinic precautions discussed, patient verbalized understanding.   Oscar Eagles, PA-C [facility] [phone] 12/07/2016  11:30 AM

## 2016-12-07 NOTE — Patient Instructions (Addendum)
Arthritis Arthritis is a term that is commonly used to refer to joint pain or joint disease. There are more than 100 types of arthritis. What are the causes? The most common cause of this condition is wear and tear of a joint. Other causes include:  Gout.  Inflammation of a joint.  An infection of a joint.  Sprains and other injuries near the joint.  A drug reaction or allergic reaction.  In some cases, the cause may not be known. What are the signs or symptoms? The main symptom of this condition is pain in the joint with movement. Other symptoms include:  Redness, swelling, or stiffness at a joint.  Warmth coming from the joint.  Fever.  Overall feeling of illness.  How is this diagnosed? This condition may be diagnosed with a physical exam and tests, including:  Blood tests.  Urine tests.  Imaging tests, such as MRI, X-rays, or a CT scan.  Sometimes, fluid is removed from a joint for testing. How is this treated? Treatment for this condition may involve:  Treatment of the cause, if it is known.  Rest.  Raising (elevating) the joint.  Applying cold or hot packs to the joint.  Medicines to improve symptoms and reduce inflammation.  Injections of a steroid such as cortisone into the joint to help reduce pain and inflammation.  Depending on the cause of your arthritis, you may need to make lifestyle changes to reduce stress on your joint. These changes may include exercising more and losing weight. Follow these instructions at home: Medicines  Take over-the-counter and prescription medicines only as told by your health care provider.  Do not take aspirin to relieve pain if gout is suspected. Activity  Rest your joint if told by your health care provider. Rest is important when your disease is active and your joint feels painful, swollen, or stiff.  Avoid activities that make the pain worse. It is important to balance activity with rest.  Exercise your  joint regularly with range-of-motion exercises as told by your health care provider. Try doing low-impact exercise, such as: ? Swimming. ? Water aerobics. ? Biking. ? Walking. Joint Care   If your joint is swollen, keep it elevated if told by your health care provider.  If your joint feels stiff in the morning, try taking a warm shower.  If directed, apply heat to the joint. If you have diabetes, do not apply heat without permission from your health care provider. ? Put a towel between the joint and the hot pack or heating pad. ? Leave the heat on the area for 20-30 minutes.  If directed, apply ice to the joint: ? Put ice in a plastic bag. ? Place a towel between your skin and the bag. ? Leave the ice on for 20 minutes, 2-3 times per day.  Keep all follow-up visits as told by your health care provider. This is important. Contact a health care provider if:  The pain gets worse.  You have a fever. Get help right away if:  You develop severe joint pain, swelling, or redness.  Many joints become painful and swollen.  You develop severe back pain.  You develop severe weakness in your leg.  You cannot control your bladder or bowels. This information is not intended to replace advice given to you by your health care provider. Make sure you discuss any questions you have with your health care provider. Document Released: 02/17/2004 Document Revised: 06/17/2015 Document Reviewed: 04/06/2014 Elsevier Interactive Patient   Education  2018 [employer]     IF you received an x-ray today, you will receive an invoice from [facility]. Please contact [facility] at [phone] with questions or concerns regarding your invoice.   IF you received labwork today, you will receive an invoice from [facility]. Please contact [facility] at [phone] with questions or concerns regarding your invoice.   Our billing staff will not be able to assist you with questions  regarding bills from these companies.  You will be contacted with the lab results as soon as they are available. The fastest way to get your results is to activate your My Chart account. Instructions are located on the last page of this paperwork. If you have not heard from us regarding the results in 2 weeks, please contact this office.      

## 2016-12-22 DIAGNOSIS — G8929 Other chronic pain: Secondary | ICD-10-CM | POA: Diagnosis not present

## 2016-12-22 DIAGNOSIS — M5441 Lumbago with sciatica, right side: Secondary | ICD-10-CM | POA: Diagnosis not present

## 2016-12-22 DIAGNOSIS — M4150 Other secondary scoliosis, site unspecified: Secondary | ICD-10-CM | POA: Diagnosis not present

## 2016-12-26 DIAGNOSIS — D1801 Hemangioma of skin and subcutaneous tissue: Secondary | ICD-10-CM | POA: Diagnosis not present

## 2016-12-26 DIAGNOSIS — D2262 Melanocytic nevi of left upper limb, including shoulder: Secondary | ICD-10-CM | POA: Diagnosis not present

## 2016-12-26 DIAGNOSIS — D225 Melanocytic nevi of trunk: Secondary | ICD-10-CM | POA: Diagnosis not present

## 2016-12-26 DIAGNOSIS — D2272 Melanocytic nevi of left lower limb, including hip: Secondary | ICD-10-CM | POA: Diagnosis not present

## 2016-12-26 DIAGNOSIS — L821 Other seborrheic keratosis: Secondary | ICD-10-CM | POA: Diagnosis not present

## 2017-01-09 DIAGNOSIS — M5441 Lumbago with sciatica, right side: Secondary | ICD-10-CM | POA: Diagnosis not present

## 2017-01-09 DIAGNOSIS — G8929 Other chronic pain: Secondary | ICD-10-CM | POA: Diagnosis not present

## 2017-01-17 ENCOUNTER — Other Ambulatory Visit: Payer: Self-pay | Admitting: Family Medicine

## 2017-01-24 DIAGNOSIS — H903 Sensorineural hearing loss, bilateral: Secondary | ICD-10-CM | POA: Diagnosis not present

## 2017-01-24 DIAGNOSIS — M5416 Radiculopathy, lumbar region: Secondary | ICD-10-CM | POA: Insufficient documentation

## 2017-01-24 DIAGNOSIS — J310 Chronic rhinitis: Secondary | ICD-10-CM | POA: Diagnosis not present

## 2017-01-24 DIAGNOSIS — J342 Deviated nasal septum: Secondary | ICD-10-CM | POA: Diagnosis not present

## 2017-01-24 DIAGNOSIS — H6123 Impacted cerumen, bilateral: Secondary | ICD-10-CM | POA: Diagnosis not present

## 2017-02-07 DIAGNOSIS — M5416 Radiculopathy, lumbar region: Secondary | ICD-10-CM | POA: Diagnosis not present

## 2017-02-17 ENCOUNTER — Encounter: Payer: Self-pay | Admitting: Family Medicine

## 2017-03-01 ENCOUNTER — Other Ambulatory Visit: Payer: Self-pay | Admitting: Family Medicine

## 2017-03-02 ENCOUNTER — Encounter: Payer: Self-pay | Admitting: Physician Assistant

## 2017-03-02 ENCOUNTER — Ambulatory Visit (INDEPENDENT_AMBULATORY_CARE_PROVIDER_SITE_OTHER): Payer: Medicare Other

## 2017-03-02 ENCOUNTER — Ambulatory Visit (INDEPENDENT_AMBULATORY_CARE_PROVIDER_SITE_OTHER): Payer: Medicare Other | Admitting: Physician Assistant

## 2017-03-02 VITALS — BP 114/62 | HR 101 | Temp 98.6°F | Resp 16 | Ht 69.0 in | Wt 182.2 lb

## 2017-03-02 DIAGNOSIS — R000 Tachycardia, unspecified: Secondary | ICD-10-CM | POA: Diagnosis not present

## 2017-03-02 DIAGNOSIS — R059 Cough, unspecified: Secondary | ICD-10-CM

## 2017-03-02 DIAGNOSIS — J22 Unspecified acute lower respiratory infection: Secondary | ICD-10-CM | POA: Diagnosis not present

## 2017-03-02 DIAGNOSIS — R05 Cough: Secondary | ICD-10-CM

## 2017-03-02 DIAGNOSIS — R0989 Other specified symptoms and signs involving the circulatory and respiratory systems: Secondary | ICD-10-CM

## 2017-03-02 MED ORDER — PROMETHAZINE-DM 6.25-15 MG/5ML PO SYRP: 5.0000 mL | ORAL_SOLUTION | Freq: Four times a day (QID) | ORAL | 0 refills | Status: DC | PRN

## 2017-03-02 MED ORDER — GUAIFENESIN ER 1200 MG PO TB12: 1.0000 | ORAL_TABLET | Freq: Two times a day (BID) | ORAL | 1 refills | Status: DC | PRN

## 2017-03-02 MED ORDER — AZITHROMYCIN 250 MG PO TABS
ORAL_TABLET | ORAL | 0 refills | Status: DC
Start: 2017-03-02 — End: 2017-06-25

## 2017-03-02 NOTE — Progress Notes (Signed)
Oscar Smith  MRN: [id number] DOB: 1946/01/07  PCP: Wardell Honour, MD  Subjective:  Pt is a 72 year old male PMH HTN, cancer, GERD who presents to clinic for cough x >1 week. Cough recently became productive. Is now keeping him up at night. He has taken Promethazine/codeine that he received last year. Endorses HA and fatigue.  He is not sleeping well due to cough.  Denies fever, chills, sinus pressure, chest tightness, wheezing.   Review of Systems  Constitutional: Positive for fatigue. Negative for chills, diaphoresis and fever.  Respiratory: Positive for cough. Negative for chest tightness, shortness of breath and wheezing.   Cardiovascular: Negative for chest pain and palpitations.  Neurological: Positive for headaches.  Psychiatric/Behavioral: Positive for sleep disturbance.    There are no active problems to display for this patient.   Current Outpatient Medications on File Prior to Visit  Medication Sig Dispense Refill  . aspirin 81 MG tablet Take 81 mg by mouth daily.    Marland Kitchen lisinopril (PRINIVIL,ZESTRIL) 10 MG tablet Take 10 mg by mouth daily.     No current facility-administered medications on file prior to visit.     No Known Allergies   Objective:  BP 114/62   Pulse (!) 101   Temp 98.6 F (37 C) (Oral)   Resp 16   Ht 5\' 9"  (1.753 m)   Wt 182 lb 3.2 oz (82.6 kg)   SpO2 95%   BMI 26.91 kg/m   Physical Exam  Constitutional: He is oriented to person, place, and time and well-developed, well-nourished, and in no distress. He does not have a sickly appearance. No distress.  HENT:  Right Ear: Tympanic membrane normal.  Left Ear: Tympanic membrane normal.  Nose: Mucosal edema present. No rhinorrhea. Right sinus exhibits no maxillary sinus tenderness and no frontal sinus tenderness. Left sinus exhibits no maxillary sinus tenderness and no frontal sinus tenderness.  Mouth/Throat: Oropharynx is clear and moist and mucous membranes are normal.  Cardiovascular:  Normal rate, regular rhythm and normal heart sounds.  Pulmonary/Chest: Effort normal. No accessory muscle usage. No respiratory distress. He has no wheezes. He has rhonchi in the left lower field. He has rales in the left lower field.  Neurological: He is alert and oriented to person, place, and time. GCS score is 15.  Skin: Skin is warm and dry.  Psychiatric: Mood, memory, affect and judgment normal.  Vitals reviewed.   Dg Chest 2 View  Result Date: 03/02/2017 CLINICAL DATA:  Cough for the past 10 days. EXAM: CHEST  2 VIEW COMPARISON:  None. FINDINGS: Normal sized heart. Clear lungs. The lungs are hyperexpanded with mild diffuse peribronchial thickening. Thoracic spine degenerative changes and mild scoliosis. IMPRESSION: No acute abnormality.  Changes of COPD and chronic bronchitis. Electronically Signed   By: Claudie Revering M.D.   On: 03/02/2017 10:16    Assessment and Plan :  1. Lower respiratory infection - azithromycin (ZITHROMAX) 250 MG tablet; Take 2 tabs PO x 1 dose, then 1 tab PO QD x 4 days  Dispense: 6 tablet; Refill: 0 - Pt presents for worsening cough x >1 week. He is tachycardic. Chest x-ray is negative. Suspect lower respiratory infection although negative chest xr. Plan to cover. RTC in 7-10 days if no improvement.  2. Cough - DG Chest 2 View; Future - promethazine-dextromethorphan (PROMETHAZINE-DM) 6.25-15 MG/5ML syrup; Take 5 mLs by mouth 4 (four) times daily as needed for cough.  Dispense: 118 mL; Refill: 0  3. Chest congestion -  Guaifenesin (MUCINEX MAXIMUM STRENGTH) 1200 MG TB12; Take 1 tablet (1,200 mg total) by mouth every 12 (twelve) hours as needed.  Dispense: 14 tablet; Refill: 1 4. Tachycardia  Mercer Pod, PA-C  [facility] 03/02/2017 9:58 AM

## 2017-03-02 NOTE — Patient Instructions (Addendum)
Start Azithromycin - this is an antibiotic to help clear your infection.  Stay well hydrated! Drink at least 32-64 oz water/day. Guaifenesin will help thin out your mucus to help you cough it up. Drink plenty of water with this   Promethazine/codeine is for your cough at night.  Promethazine/dextormethorphan is for your cough during the day.   Get lost of rest. Wash your hands often.   -Foods that can help speed recovery: honey, garlic, chicken soup, elderberries, green tea.  -Supplements that can help speed recovery: vitamin C, zinc, elderberry extract, quercetin, ginseng, selenium -Supplement with prebiotics and probiotics:   For sore throat: ? Gargle with 8 oz of salt water ( tsp of salt per 1 qt of water) as often as every 1-2 hours to soothe your throat.  Gargle liquid benadryl.  Cepacol throat lozenges (if you are not at risk for choking).  For sore throat try using a honey-based tea. Use 3 teaspoons of honey with juice squeezed from half lemon. Place shaved pieces of ginger into 1/2-1 cup of water and warm over stove top. Then mix the ingredients and repeat every 4 hours as needed.  Cough Syrup Recipe: Sweet Lemon & Honey Thyme  Ingredients a handful of fresh thyme sprigs   1 pint of water (2 cups)  1/2 cup honey (raw is best, but regular will do)  1/2 lemon chopped Instructions 1. Place the lemon in the pint jar and cover with the honey. The honey will macerate the lemons and draw out liquids which taste so delicious! 2. Meanwhile, toss the thyme leaves into a saucepan and cover them with the water. 3. Bring the water to a gentle simmer and reduce it to half, about a cup of tea. 4. When the tea is reduced and cooled a bit, strain the sprigs & leaves, add it into the pint jar and stir it well. 5. Give it a shake and use a spoonful as needed. 6. Store your homemade cough syrup in the refrigerator for about a month.  What causes a cough? In adults, common causes of a cough  include: ?An infection of the airways or lungs (such as the common cold) ?Postnasal drip - Postnasal drip is when mucus from the nose drips down or flows along the back of the throat. Postnasal drip can happen when people have: .A cold .Allergies .A sinus infection - The sinuses are hollow areas in the bones of the face that open into the nose. ?Lung conditions, like asthma and chronic obstructive pulmonary disease (COPD) - Both of these conditions can make it hard to breathe. COPD is usually caused by smoking. ?Acid reflux - Acid reflux is when the acid that is normally in your stomach backs up into your esophagus (the tube that carries food from your mouth to your stomach). ?A side effect from blood pressure medicines called "ACE inhibitors" ?Smoking cigarettes  Is there anything I can do on my own to get rid of my cough? Yes. To help get rid of your cough, you can: ?Use a humidifier in your bedroom ?Use an over-the-counter cough medicine, or suck on cough drops or hard candy ?Stop smoking, if you smoke ?If you have allergies, avoid the things you are allergic to (like pollen, dust, animals, or mold) If you have acid reflux, your doctor or nurse will tell you which lifestyle changes can help reduce symptoms.     IF you received an x-ray today, you will receive an invoice from [facility]. Please  contact [facility] at [phone] with questions or concerns regarding your invoice.   IF you received labwork today, you will receive an invoice from [facility]. Please contact [facility] at [phone] with questions or concerns regarding your invoice.   Our billing staff will not be able to assist you with questions regarding bills from these companies.  You will be contacted with the lab results as soon as they are available. The fastest way to get your results is to activate your My Chart account. Instructions are located on the last page of this paperwork. If you have not  heard from [place] regarding the results in 2 weeks, please contact this office.

## 2017-03-05 ENCOUNTER — Encounter: Payer: Self-pay | Admitting: Urgent Care

## 2017-03-16 DIAGNOSIS — H2513 Age-related nuclear cataract, bilateral: Secondary | ICD-10-CM | POA: Diagnosis not present

## 2017-03-29 ENCOUNTER — Encounter: Payer: Self-pay | Admitting: Family Medicine

## 2017-04-01 ENCOUNTER — Other Ambulatory Visit: Payer: Self-pay | Admitting: Family Medicine

## 2017-04-02 NOTE — Telephone Encounter (Signed)
Lisinopril refill Last OV: 10/04/16 Last Refill:10/04/16 #90 tabs 1 RF PCP: Reginia Forts MD Pharmacy:[facility]

## 2017-04-08 ENCOUNTER — Encounter: Payer: Self-pay | Admitting: Family Medicine

## 2017-04-15 ENCOUNTER — Other Ambulatory Visit: Payer: Self-pay | Admitting: Family Medicine

## 2017-04-29 ENCOUNTER — Other Ambulatory Visit: Payer: Self-pay | Admitting: Family Medicine

## 2017-05-14 DIAGNOSIS — C61 Malignant neoplasm of prostate: Secondary | ICD-10-CM | POA: Diagnosis not present

## 2017-05-21 DIAGNOSIS — R350 Frequency of micturition: Secondary | ICD-10-CM | POA: Diagnosis not present

## 2017-05-21 DIAGNOSIS — N481 Balanitis: Secondary | ICD-10-CM | POA: Diagnosis not present

## 2017-05-21 DIAGNOSIS — C61 Malignant neoplasm of prostate: Secondary | ICD-10-CM | POA: Diagnosis not present

## 2017-05-28 ENCOUNTER — Other Ambulatory Visit: Payer: Self-pay | Admitting: Family Medicine

## 2017-05-28 MED ORDER — LISINOPRIL 10 MG PO TABS
10.0000 mg | ORAL_TABLET | Freq: Every day | ORAL | 0 refills | Status: DC
Start: 2017-05-28 — End: 2017-06-03

## 2017-06-03 ENCOUNTER — Encounter: Payer: Self-pay | Admitting: Family Medicine

## 2017-06-03 MED ORDER — LISINOPRIL 10 MG PO TABS: 15.0000 mg | ORAL_TABLET | Freq: Every day | ORAL | 0 refills | Status: DC

## 2017-06-20 ENCOUNTER — Encounter: Payer: Self-pay | Admitting: Family Medicine

## 2017-06-21 ENCOUNTER — Other Ambulatory Visit: Payer: Self-pay | Admitting: Family Medicine

## 2017-06-25 ENCOUNTER — Other Ambulatory Visit: Payer: Self-pay

## 2017-06-25 ENCOUNTER — Encounter: Payer: Self-pay | Admitting: Family Medicine

## 2017-06-25 ENCOUNTER — Ambulatory Visit (INDEPENDENT_AMBULATORY_CARE_PROVIDER_SITE_OTHER): Payer: Medicare Other | Admitting: Family Medicine

## 2017-06-25 VITALS — BP 138/70 | HR 84 | Temp 98.0°F | Resp 16 | Ht 69.29 in | Wt 182.6 lb

## 2017-06-25 DIAGNOSIS — K219 Gastro-esophageal reflux disease without esophagitis: Secondary | ICD-10-CM | POA: Diagnosis not present

## 2017-06-25 DIAGNOSIS — C61 Malignant neoplasm of prostate: Secondary | ICD-10-CM

## 2017-06-25 DIAGNOSIS — Z23 Encounter for immunization: Secondary | ICD-10-CM | POA: Diagnosis not present

## 2017-06-25 DIAGNOSIS — E7800 Pure hypercholesterolemia, unspecified: Secondary | ICD-10-CM

## 2017-06-25 DIAGNOSIS — R339 Retention of urine, unspecified: Secondary | ICD-10-CM

## 2017-06-25 DIAGNOSIS — J301 Allergic rhinitis due to pollen: Secondary | ICD-10-CM

## 2017-06-25 DIAGNOSIS — Z0000 Encounter for general adult medical examination without abnormal findings: Secondary | ICD-10-CM | POA: Diagnosis not present

## 2017-06-25 LAB — COMPREHENSIVE METABOLIC PANEL
ALT: 37 IU/L (ref 0–44)
AST: 26 IU/L (ref 0–40)
Albumin/Globulin Ratio: 1.8 (ref 1.2–2.2)
Albumin: 4.4 g/dL (ref 3.5–4.8)
Alkaline Phosphatase: 103 IU/L (ref 39–117)
BUN/Creatinine Ratio: 8 — ABNORMAL LOW (ref 10–24)
BUN: 8 mg/dL (ref 8–27)
Bilirubin Total: 0.8 mg/dL (ref 0.0–1.2)
CO2: 24 mmol/L (ref 20–29)
Calcium: 9.2 mg/dL (ref 8.6–10.2)
Chloride: 99 mmol/L (ref 96–106)
Creatinine, Ser: 0.97 mg/dL (ref 0.76–1.27)
GFR calc Af Amer: 89 mL/min/{1.73_m2} (ref >59)
GFR calc non Af Amer: 77 mL/min/{1.73_m2} (ref >59)
Globulin, Total: 2.4 g/dL (ref 1.5–4.5)
Glucose: 91 mg/dL (ref 65–99)
Potassium: 4.2 mmol/L (ref 3.5–5.2)
Sodium: 136 mmol/L (ref 134–144)
Total Protein: 6.8 g/dL (ref 6.0–8.5)

## 2017-06-25 LAB — CBC WITH DIFFERENTIAL/PLATELET
Basophils Absolute: 0 10*3/uL (ref 0.0–0.2)
Basos: 0 %
EOS (ABSOLUTE): 0.1 10*3/uL (ref 0.0–0.4)
Eos: 2 %
Hematocrit: 44 % (ref 37.5–51.0)
Hemoglobin: 15.3 g/dL (ref 13.0–17.7)
Immature Grans (Abs): 0 10*3/uL (ref 0.0–0.1)
Immature Granulocytes: 0 %
Lymphocytes Absolute: 1.3 10*3/uL (ref 0.7–3.1)
Lymphs: 33 %
MCH: 31.9 pg (ref 26.6–33.0)
MCHC: 34.8 g/dL (ref 31.5–35.7)
MCV: 92 fL (ref 79–97)
Monocytes Absolute: 0.7 10*3/uL (ref 0.1–0.9)
Monocytes: 17 %
Neutrophils Absolute: 1.9 10*3/uL (ref 1.4–7.0)
Neutrophils: 48 %
Platelets: 165 10*3/uL (ref 150–450)
RBC: 4.8 x10E6/uL (ref 4.14–5.80)
RDW: 13.4 % (ref 12.3–15.4)
WBC: 4 10*3/uL (ref 3.4–10.8)

## 2017-06-25 LAB — LIPID PANEL
Chol/HDL Ratio: 3.4 ratio (ref 0.0–5.0)
Cholesterol, Total: 124 mg/dL (ref 100–199)
HDL: 36 mg/dL — ABNORMAL LOW (ref >39)
LDL Calculated: 62 mg/dL (ref 0–99)
Triglycerides: 132 mg/dL (ref 0–149)
VLDL Cholesterol Cal: 26 mg/dL (ref 5–40)

## 2017-06-25 LAB — POCT URINALYSIS DIP (MANUAL ENTRY)
Bilirubin, UA: NEGATIVE
Blood, UA: NEGATIVE
Glucose, UA: NEGATIVE mg/dL
Ketones, POC UA: NEGATIVE mg/dL
Leukocytes, UA: NEGATIVE
Nitrite, UA: NEGATIVE
Protein Ur, POC: NEGATIVE mg/dL
Spec Grav, UA: 1.01 (ref 1.010–1.025)
Urobilinogen, UA: 0.2 E.U./dL
pH, UA: 7 (ref 5.0–8.0)

## 2017-06-25 MED ORDER — LISINOPRIL 20 MG PO TABS: 20.0000 mg | ORAL_TABLET | Freq: Every day | ORAL | 3 refills | Status: DC

## 2017-06-25 MED ORDER — ATORVASTATIN CALCIUM 10 MG PO TABS: 10.0000 mg | ORAL_TABLET | Freq: Every day | ORAL | 3 refills | Status: DC

## 2017-06-25 MED ORDER — ZOSTER VAC RECOMB ADJUVANTED 50 MCG/0.5ML IM SUSR: 0.5000 mL | Freq: Once | INTRAMUSCULAR | 1 refills | Status: AC

## 2017-06-25 MED ORDER — IPRATROPIUM BROMIDE 0.03 % NA SOLN: 2.0000 | Freq: Two times a day (BID) | NASAL | 0 refills | Status: DC

## 2017-06-25 MED ORDER — FLUTICASONE PROPIONATE 50 MCG/ACT NA SUSP: 2.0000 | Freq: Every day | NASAL | 3 refills | Status: DC

## 2017-06-25 MED ORDER — AMOXICILLIN-POT CLAVULANATE 875-125 MG PO TABS: 1.0000 | ORAL_TABLET | Freq: Two times a day (BID) | ORAL | 0 refills | Status: DC

## 2017-06-25 MED ORDER — OMEPRAZOLE 20 MG PO CPDR: 20.0000 mg | DELAYED_RELEASE_CAPSULE | Freq: Every day | ORAL | 3 refills | Status: DC

## 2017-06-25 NOTE — Progress Notes (Signed)
Subjective:    Patient ID: Oscar Smith, male    DOB: 18-Mar-1945, 72 y.o.   MRN: [id number]  06/25/2017  Medicare Wellness    HPI This 72 y.o. male presents for [place] and follow-up of chronic medical conditions.   Visual Acuity Screening   Right eye Left eye Both eyes  Without correction:     With correction: 20/25 20/25 20/20     BP Readings from Last 3 Encounters:  06/25/17 138/70  03/02/17 114/62  12/07/16 (!) 159/86   Wt Readings from Last 3 Encounters:  06/25/17 182 lb 9.6 oz (82.8 kg)  03/02/17 182 lb 3.2 oz (82.6 kg)  12/07/16 184 lb (83.5 kg)   Immunization History  Administered Date(s) Administered  . Influenza Split 10/21/2010, 10/18/2011  . Influenza,inj,Quad PF,6+ Mos 11/01/2012, 09/25/2013, 11/02/2015, 11/20/2016  . Influenza-Unspecified 10/23/2016  . MMR 02/13/2013  . Pneumococcal Conjugate-13 06/22/2015  . Pneumococcal Polysaccharide-23 08/21/2008  . Tdap 01/30/2013  . Zoster 10/21/2010   Health Maintenance  Topic Date Due  . INFLUENZA VACCINE  08/23/2017  . COLONOSCOPY  11/14/2021  . TETANUS/TDAP  01/31/2023  . Hepatitis C Screening  Completed  . PNA vac Low Risk Adult  Completed   ILLNESS: onset of fever Tmax 100.5.  Onset of illness one week ago.  +fatigue.  No headache.  +PND; no sore throat.  Same symptoms last year.  +rhinorrhea; Blowing like crazy.  Clear.  No nasal congestion.  Coughing a lot; some congestion. Mostly PND.  Minimal DOE.  Tired very easily.  No n/v/d.  Took TYlenol PM last night and slept well.  No other medications at home.  Taking Flonase daily.  HTN: Patient reports good compliance with medication, good tolerance to medication, and good symptom control.    Hypercholesterolemia: Patient reports good compliance with medication, good tolerance to medication, and good symptom control.    GERD: Patient reports good compliance with medication, good tolerance to medication, and good symptom control.      Prostate Cancer: last folllow-up last month.  Followed annually.  Review of Systems  Constitutional: Positive for chills, diaphoresis, fatigue and fever. Negative for activity change, appetite change and unexpected weight change.  HENT: Positive for congestion, postnasal drip, rhinorrhea and sinus pain. Negative for dental problem, drooling, ear discharge, ear pain, facial swelling, hearing loss, mouth sores, nosebleeds, sinus pressure, sneezing, sore throat, tinnitus, trouble swallowing and voice change.   Eyes: Negative for photophobia, pain, discharge, redness, itching and visual disturbance.  Respiratory: Positive for cough and shortness of breath. Negative for apnea, choking, chest tightness, wheezing and stridor.   Cardiovascular: Negative for chest pain, palpitations and leg swelling.  Gastrointestinal: Negative for abdominal distention, abdominal pain, anal bleeding, blood in stool, constipation, diarrhea, nausea, rectal pain and vomiting.  Endocrine: Negative for cold intolerance, heat intolerance, polydipsia, polyphagia and polyuria.  Genitourinary: Negative for decreased urine volume, difficulty urinating, discharge, dysuria, enuresis, flank pain, frequency, genital sores, hematuria, penile pain, penile swelling, scrotal swelling, testicular pain and urgency.  Musculoskeletal: Negative for arthralgias, back pain, gait problem, joint swelling, myalgias, neck pain and neck stiffness.  Skin: Negative for color change, pallor, rash and wound.  Allergic/Immunologic: Negative for environmental allergies, food allergies and immunocompromised state.  Neurological: Negative for dizziness, tremors, seizures, syncope, facial asymmetry, speech difficulty, weakness, light-headedness, numbness and headaches.  Hematological: Negative for adenopathy. Does not bruise/bleed easily.  Psychiatric/Behavioral: Negative for agitation, behavioral problems, confusion, decreased concentration, dysphoric mood,  hallucinations, self-injury, sleep disturbance and suicidal ideas. The  patient is not nervous/anxious and is not hyperactive.     Past Medical History:  Diagnosis Date  . Adenocarcinoma of prostate [facility]) urologist-  dr Tresa Moore--  dx Nov 2011--  T1c, Gleason 3+3,  PSA 4.2 (post IMRT 03/2010   Low risk recurrence, Biochemical control,  last PSA 0.30 on 05-28-2014  . BPH (benign prostatic hyperplasia)   . Cancer ([place])   . Diverticulitis   . ED (erectile dysfunction) of organic origin   . GERD (gastroesophageal reflux disease)   . Hiatal hernia   . History of colon polyps    2002 and 2003 hyperplastic  . History of gastritis   . Hypertension   . Lower urinary tract symptoms (LUTS)   . Phimosis   . Sleep apnea    did not complete study.    . Wears hearing aid    bilateral   Past Surgical History:  Procedure Laterality Date  . ABDOMINAL HERNIA REPAIR    . CIRCUMCISION N/A 06/09/2015   Procedure: CIRCUMCISION ADULT WITH PENILE BLOCK;  Surgeon: Alexis Frock, MD;  Location: [facility];  Service: Urology;  Laterality: N/A;  . COLONOSCOPY  last one 11-15-2011  . ENDOVENOUS ABLATION SAPHENOUS VEIN W/ LASER Left 02/10/2016   endovenous laser ablation left greater saphenous vein and stab phlebectomy left leg by Curt Jews MD  . INGUINAL HERNIA REPAIR Right 12-23-2009  . left shoulder surgery    . LIPOMA EXCISION N/A 12/01/2016   Procedure: EXCISION POSTERIOR NECK MASS;  Surgeon: Irene Limbo, MD;  Location: [facility];  Service: Plastics;  Laterality: N/A;  . PROSTATE SURGERY  2009  . PROSTATE SURGERY    . SHOULDER ARTHROSCOPY WITH SUBACROMIAL DECOMPRESSION, ROTATOR CUFF REPAIR AND BICEP TENDON REPAIR Left 10-2014   Allergies  Allergen Reactions  . Flomax [Tamsulosin Hcl]     "bad reaction" jaw pain   Current Outpatient Medications on File Prior to Visit  Medication Sig Dispense Refill  . aspirin EC 81 MG tablet Take 81 mg daily by mouth.    .  Multiple Vitamin (MULTIVITAMIN) tablet Take 1 tablet by mouth daily.    . Omega-3 Fatty Acids (FISH OIL OMEGA-3 PO) Take by mouth.    Marland Kitchen OVER THE COUNTER MEDICATION OTC Ultraflora taking prn for IB     No current facility-administered medications on file prior to visit.    Social History   Socioeconomic History  . Marital status: Married    Spouse name: Not on file  . Number of children: 0  . Years of education: BS  . Highest education level: Not on file  Occupational History  . Occupation: Development worker, community  . Occupation: Chief Financial Officer  Social Needs  . Financial resource strain: Not on file  . Food insecurity:    Worry: Not on file    Inability: Not on file  . Transportation needs:    Medical: Not on file    Non-medical: Not on file  Tobacco Use  . Smoking status: Former Smoker    Types: Cigarettes    Last attempt to quit: 09/04/1965    Years since quitting: 51.8  . Smokeless tobacco: Never Used  . Tobacco comment: quit around 72 yrs old.  Substance and Sexual Activity  . Alcohol use: Yes    Alcohol/week: 12.0 oz    Types: 20 Standard drinks or equivalent per week    Comment: nightly  . Drug use: No  . Sexual activity: Yes    Partners: Male    Birth  control/protection: Post-menopausal  Lifestyle  . Physical activity:    Days per week: Not on file    Minutes per session: Not on file  . Stress: Not on file  Relationships  . Social connections:    Talks on phone: Not on file    Gets together: Not on file    Attends religious service: Not on file    Active member of club or organization: Not on file    Attends meetings of clubs or organizations: Not on file    Relationship status: Not on file  . Intimate partner violence:    Fear of current or ex partner: Not on file    Emotionally abused: Not on file    Physically abused: Not on file    Forced sexual activity: Not on file  Other Topics Concern  . Not on file  Social History Narrative   ** Merged History Encounter **         Originally from [place].   Trained as an Chief Financial Officer.    Married '75- 98 years, divorced; Married '86. No children.    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 Sculptor-life size figures    by commission (see work in [place]  Sculpting work regularly.   Never smoking.   Alcohol: drinks1- 2 beers and 1-2 wines every night;    Learning the piano. Patient get regular exercise.   Wife is a Marine scientist.   Caffeine: 3/day    ADLs: drives; independent with ADLs   Advanced Directives: YES; FULL CODE; no prolonged measures.        Family History  Problem Relation Age of Onset  . Stomach cancer Father   . Cancer Father 51       stomach versus colon cancer  . Pancreatic cancer Sister   . Cancer Sister 23       pancreatic cancer  . Cancer Brother        penile cancer       Objective:    BP 138/70   Pulse 84   Temp 98 F (36.7 C) (Oral)   Resp 16   Ht 5' 9.29" (1.76 m)   Wt 182 lb 9.6 oz (82.8 kg)   SpO2 95%   BMI 26.74 kg/m  Physical Exam  Constitutional: He is oriented to person, place, and time. He appears well-developed and well-nourished. No distress.  HENT:  Head: Normocephalic and atraumatic.  Right Ear: External ear normal.  Left Ear: External ear normal.  Nose: Nose normal.  Mouth/Throat: Oropharynx is clear and moist. No oropharyngeal exudate.  Eyes: Pupils are equal, round, and reactive to light. Conjunctivae and EOM are normal.  Neck: Normal range of motion. Neck supple. Carotid bruit is not present. No thyromegaly present.  Cardiovascular: Normal rate, regular rhythm, normal heart sounds and intact distal pulses. Exam reveals no gallop and no friction rub.  No murmur heard. Pulmonary/Chest: Effort normal and breath sounds normal. He has no wheezes. He has no rales.  Abdominal: Soft. Bowel sounds are normal. He exhibits no distension and no mass. There is no tenderness. There is no rebound  and no guarding.  Musculoskeletal:       Right shoulder: Normal.       Left shoulder: Normal.       Cervical back: Normal.  Lymphadenopathy:    He has no cervical adenopathy.  Neurological: He is alert and oriented to person, place, and time. He  has normal reflexes. No cranial nerve deficit. He exhibits normal muscle tone. Coordination normal.  Skin: Skin is warm and dry. No rash noted. He is not diaphoretic.  Psychiatric: He has a normal mood and affect. His behavior is normal. Judgment and thought content normal.   No results found. Depression screen [facility] 2/9 06/25/2017 12/07/2016 11/20/2016 10/04/2016 09/26/2016  Decreased Interest 0 0 0 0 0  Down, Depressed, Hopeless 0 0 0 0 0  PHQ - 2 Score 0 0 0 0 0   Fall Risk  06/25/2017 12/07/2016 11/20/2016 10/04/2016 09/26/2016  Falls in the past year? No No No No No    Functional Status Survey: Is the patient deaf or have difficulty hearing?: No Does the patient have difficulty seeing, even when wearing glasses/contacts?: No Does the patient have difficulty concentrating, remembering, or making decisions?: No Does the patient have difficulty walking or climbing stairs?: No Does the patient have difficulty dressing or bathing?: No Does the patient have difficulty doing errands alone such as visiting a doctor's office or shopping?: No     Assessment & Plan:   1. Encounter for Medicare annual wellness exam   2. ADENOCARCINOMA, PROSTATE, GLEASON GRADE 6   3. Seasonal allergic rhinitis due to pollen   4. Gastroesophageal reflux disease without esophagitis   5. Incomplete bladder emptying   6. Pure hypercholesterolemia   7. Need for shingles vaccine     -anticipatory guidance provided --- exercise, weight loss, safe driving practices, aspirin 56m daily. -obtain age appropriate screening labs and labs for chronic disease management. -moderate fall risk; no evidence of depression; history of hearing loss; has hearing aides.  Discussed advanced  directives and living will; also discussed end of life issues including code status.  -New onset acute sinusitis: rx Augmentin, Atrovent nasal spray; continue Flonase; recommend Dayquil and Nyquil. -HTN: stable; increase Lisinopril 230mdaily; obtain labs. -Hypercholesterolemia: no changes. -GERD: stable; well controlled. -Prostate Cancer hx: followed by [facility] once yearly. -Colon cancer screening: stable; s/p Cologuard by report by gastroenterologist. -RTC in upcoming month for Pneumovax due to acute illness with fever. -rx for shingrix provided. -recommend initiating exercise program.   Orders Placed This Encounter  Procedures  . CBC with Differential/Platelet  . Comprehensive metabolic panel    Order Specific Question:   Has the patient fasted?    Answer:   No  . Lipid panel    Order Specific Question:   Has the patient fasted?    Answer:   No  . POCT urinalysis dipstick   Meds ordered this encounter  Medications  . Zoster Vaccine Adjuvanted [facility]    Sig: Inject 0.5 mLs into the muscle once for 1 dose.    Dispense:  0.5 mL    Refill:  1  . atorvastatin (LIPITOR) 10 MG tablet    Sig: Take 1 tablet (10 mg total) by mouth daily.    Dispense:  90 tablet    Refill:  3  . fluticasone (FLONASE) 50 MCG/ACT nasal spray    Sig: Place 2 sprays into both nostrils daily.    Dispense:  48 g    Refill:  3  . lisinopril (PRINIVIL,ZESTRIL) 20 MG tablet    Sig: Take 1 tablet (20 mg total) by mouth daily.    Dispense:  90 tablet    Refill:  3  . omeprazole (PRILOSEC) 20 MG capsule    Sig: Take 1 capsule (20 mg total) by mouth daily.    Dispense:  90  capsule    Refill:  3  . amoxicillin-clavulanate (AUGMENTIN) 875-125 MG tablet    Sig: Take 1 tablet by mouth 2 (two) times daily.    Dispense:  20 tablet    Refill:  0  . ipratropium (ATROVENT) 0.03 % nasal spray    Sig: Place 2 sprays into both nostrils 2 (two) times daily.    Dispense:  30 mL    Refill:  0      Return in about 6 months (around 12/25/2017) for follow-up chronic medical conditions [place].   Ziyon Cedotal Elayne Guerin, M.D. Primary Care at [facility] previously [facility] [address] [place], [place]  [phone] phone [phone] fax

## 2017-06-25 NOTE — Patient Instructions (Addendum)
IF you received an x-ray today, you will receive an invoice from [facility]. Please contact [facility] at [phone] with questions or concerns regarding your invoice.   IF you received labwork today, you will receive an invoice from [facility]. Please contact [facility] at [phone] with questions or concerns regarding your invoice.   Our billing staff will not be able to assist you with questions regarding bills from these companies.  You will be contacted with the lab results as soon as they are available. The fastest way to get your results is to activate your My Chart account. Instructions are located on the last page of this paperwork. If you have not heard from [place] regarding the results in 2 weeks, please contact this office.      Preventive Care 24 Years and Older, Male Preventive care refers to lifestyle choices and visits with your health care provider that can promote health and wellness. What does preventive care include?  A yearly physical exam. This is also called an annual well check.  Dental exams once or twice a year.  Routine eye exams. Ask your health care provider how often you should have your eyes checked.  Personal lifestyle choices, including: ? Daily care of your teeth and gums. ? Regular physical activity. ? Eating a healthy diet. ? Avoiding tobacco and drug use. ? Limiting alcohol use. ? Practicing safe sex. ? Taking low doses of aspirin every day. ? Taking vitamin and mineral supplements as recommended by your health care provider. What happens during an annual well check? The services and screenings done by your health care provider during your annual well check will depend on your age, overall health, lifestyle risk factors, and family history of disease. Counseling Your health care provider may ask you questions about your:  Alcohol use.  Tobacco use.  Drug use.  Emotional well-being.  Home and relationship  well-being.  Sexual activity.  Eating habits.  History of falls.  Memory and ability to understand (cognition).  Work and work Statistician.  Screening You may have the following tests or measurements:  Height, weight, and BMI.  Blood pressure.  Lipid and cholesterol levels. These may be checked every 5 years, or more frequently if you are over 31 years old.  Skin check.  Lung cancer screening. You may have this screening every year starting at age 20 if you have a 30-pack-year history of smoking and currently smoke or have quit within the past 15 years.  Fecal occult blood test (FOBT) of the stool. You may have this test every year starting at age 49.  Flexible sigmoidoscopy or colonoscopy. You may have a sigmoidoscopy every 5 years or a colonoscopy every 10 years starting at age 27.  Prostate cancer screening. Recommendations will vary depending on your family history and other risks.  Hepatitis C blood test.  Hepatitis B blood test.  Sexually transmitted disease (STD) testing.  Diabetes screening. This is done by checking your blood sugar (glucose) after you have not eaten for a while (fasting). You may have this done every 1-3 years.  Abdominal aortic aneurysm (AAA) screening. You may need this if you are a current or former smoker.  Osteoporosis. You may be screened starting at age 42 if you are at high risk.  Talk with your health care provider about your test results, treatment options, and if necessary, the need for more tests. Vaccines Your health care provider may recommend certain vaccines, such as:  Influenza vaccine. This  is recommended every year.  Tetanus, diphtheria, and acellular pertussis (Tdap, Td) vaccine. You may need a Td booster every 10 years.  Varicella vaccine. You may need this if you have not been vaccinated.  Zoster vaccine. You may need this after age 48.  Measles, mumps, and rubella (MMR) vaccine. You may need at least one dose of  MMR if you were born in 1957 or later. You may also need a second dose.  Pneumococcal 13-valent conjugate (PCV13) vaccine. One dose is recommended after age 10.  Pneumococcal polysaccharide (PPSV23) vaccine. One dose is recommended after age 57.  Meningococcal vaccine. You may need this if you have certain conditions.  Hepatitis A vaccine. You may need this if you have certain conditions or if you travel or work in places where you may be exposed to hepatitis A.  Hepatitis B vaccine. You may need this if you have certain conditions or if you travel or work in places where you may be exposed to hepatitis B.  Haemophilus influenzae type b (Hib) vaccine. You may need this if you have certain risk factors.  Talk to your health care provider about which screenings and vaccines you need and how often you need them. This information is not intended to replace advice given to you by your health care provider. Make sure you discuss any questions you have with your health care provider. Document Released: 02/05/2015 Document Revised: 09/29/2015 Document Reviewed: 11/10/2014 Elsevier Interactive Patient Education  [employer].

## 2017-07-15 IMAGING — CR DG SHOULDER 2+V*L*
3 series · 3 of 3 positions shown · non-contrast
Comparison: None.

CLINICAL DATA: Left shoulder pain for 2 weeks

EXAM:
LEFT SHOULDER - 2+ VIEW

[AP]
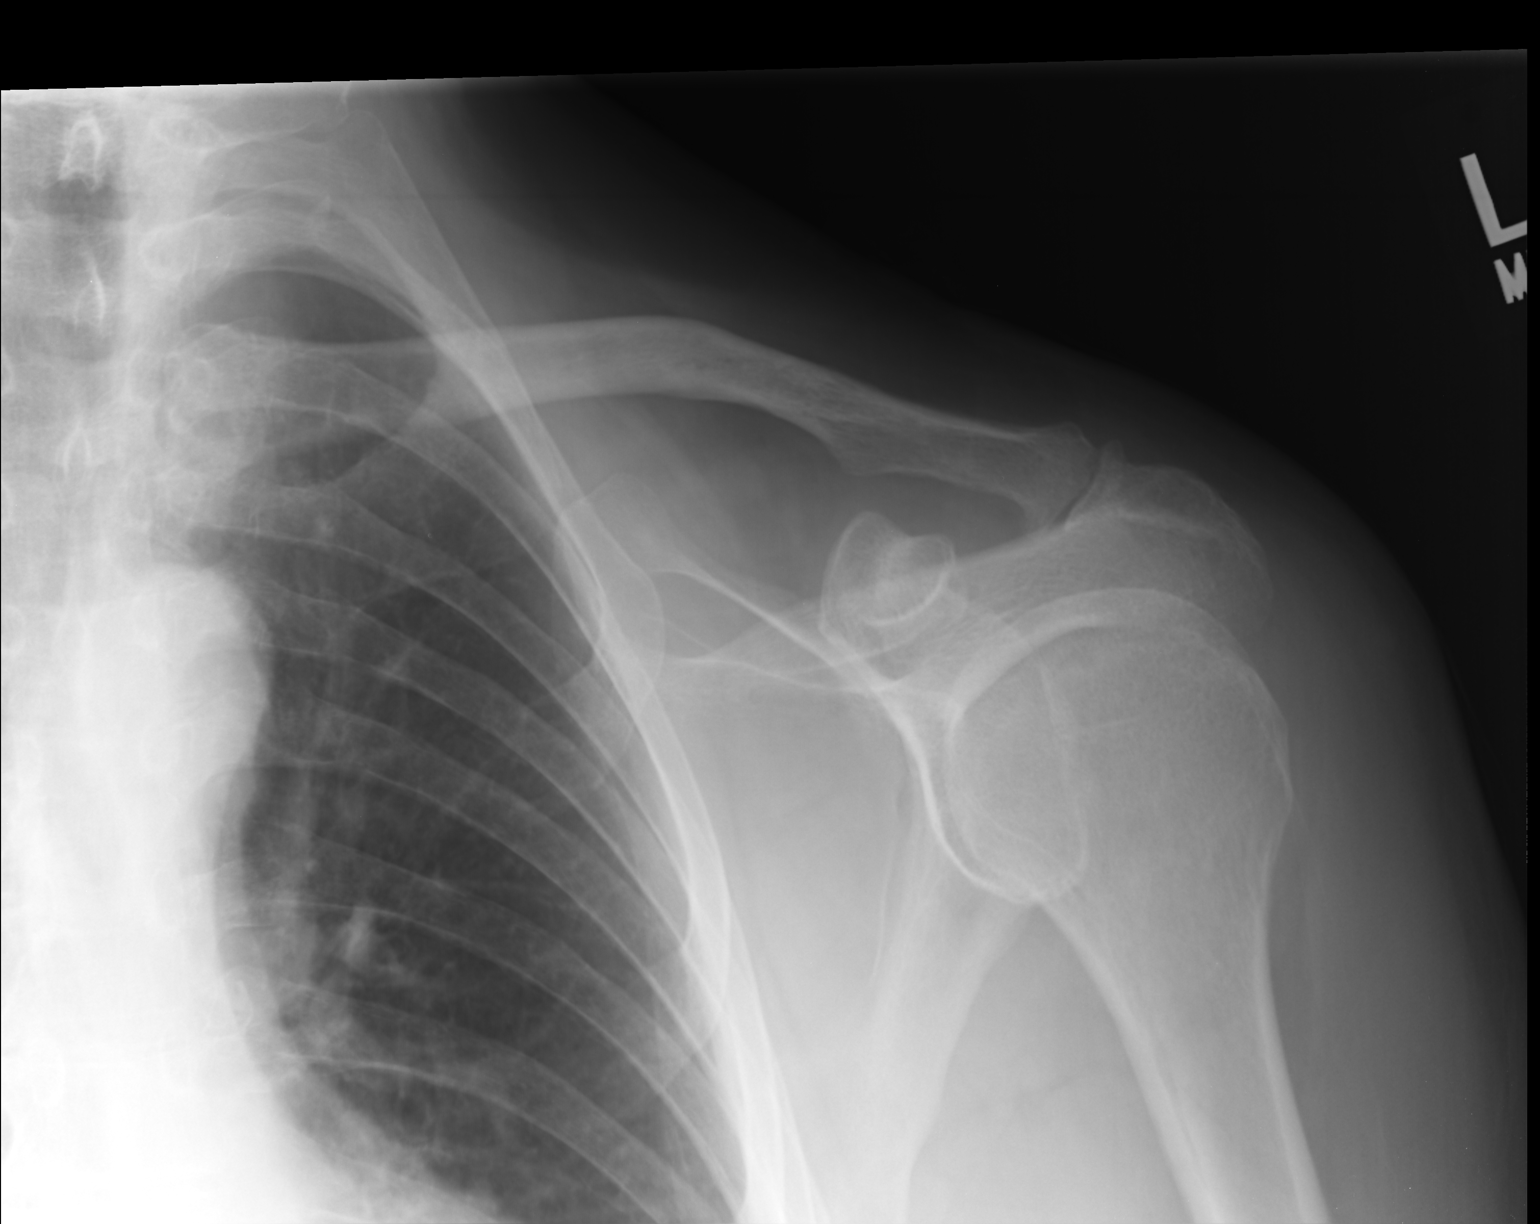

[pa y view]
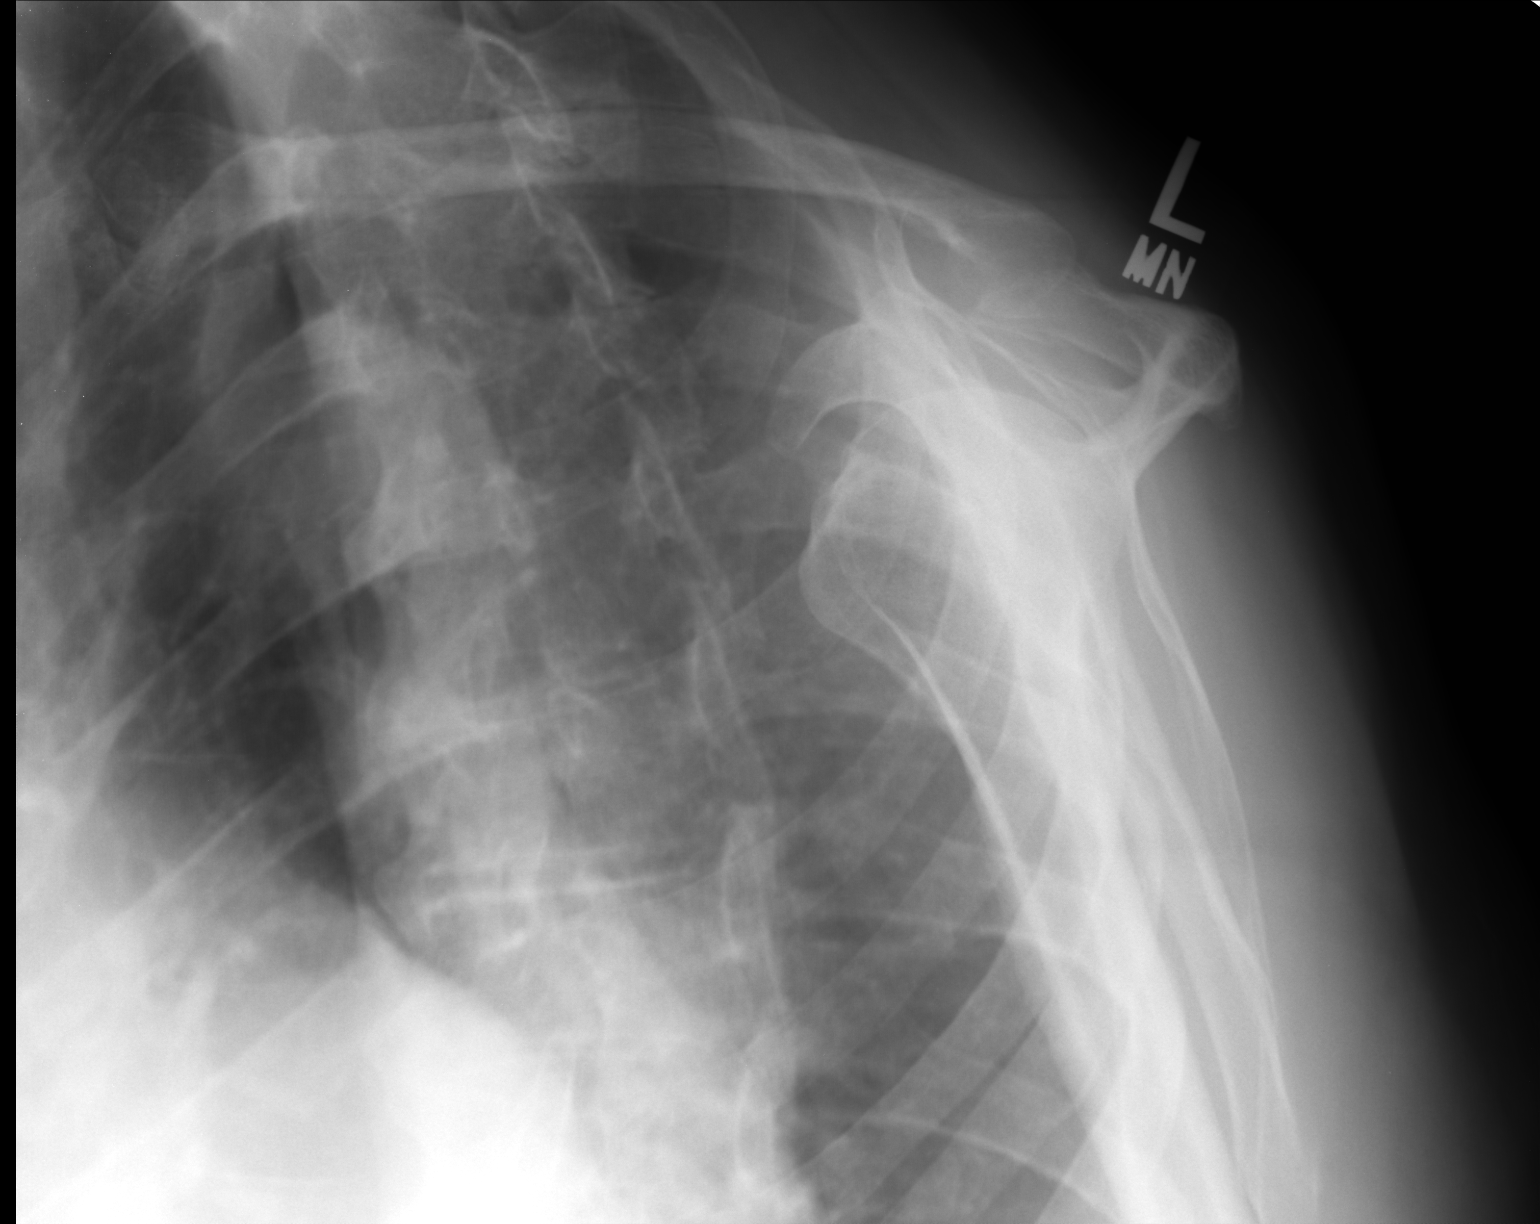

[axial]
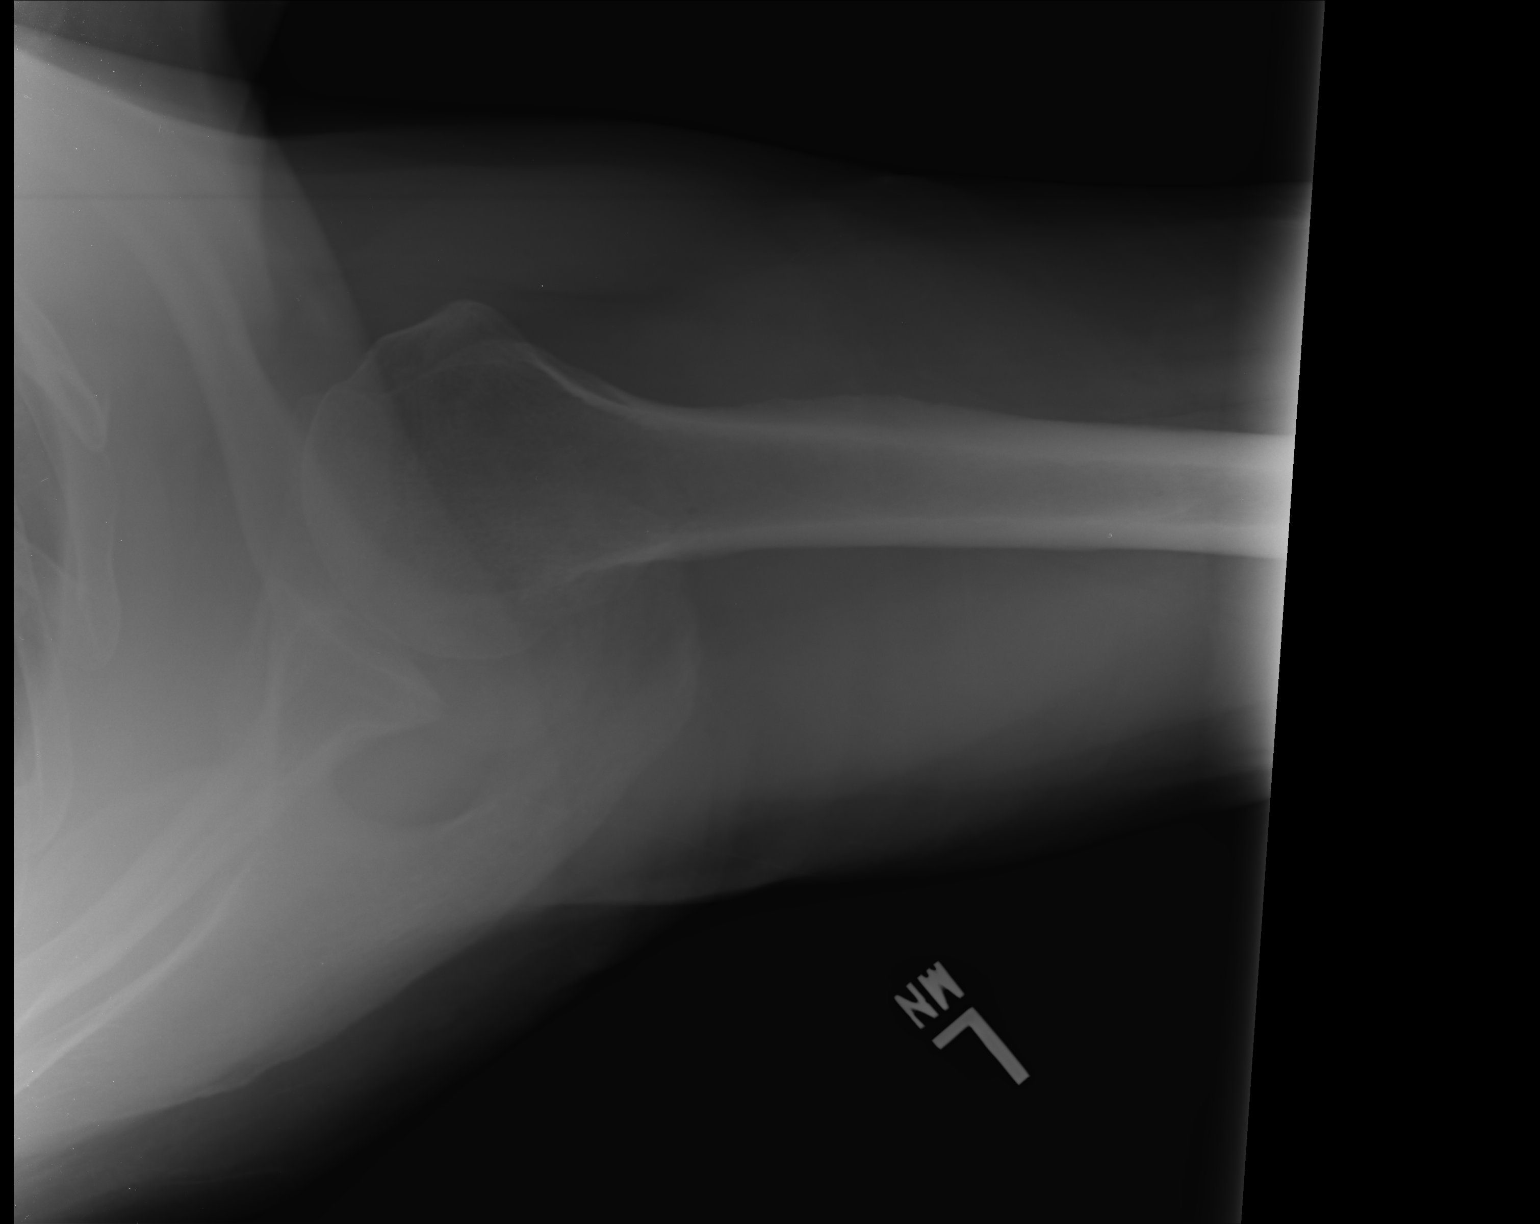

[3 of 3 positions shown; findings below may reference images not displayed]

FINDINGS: No acute fracture. No dislocation. Moderate inferior osteophyte
formation at the AC joint.
IMPRESSION: No acute bony pathology.

## 2017-07-23 ENCOUNTER — Ambulatory Visit: Payer: Medicare Other | Admitting: Family Medicine

## 2017-10-15 DIAGNOSIS — Z0000 Encounter for general adult medical examination without abnormal findings: Secondary | ICD-10-CM | POA: Diagnosis not present

## 2017-10-31 ENCOUNTER — Ambulatory Visit (INDEPENDENT_AMBULATORY_CARE_PROVIDER_SITE_OTHER): Payer: Medicare Other | Admitting: Family Medicine

## 2017-10-31 ENCOUNTER — Encounter: Payer: Self-pay | Admitting: Family Medicine

## 2017-10-31 DIAGNOSIS — Z23 Encounter for immunization: Secondary | ICD-10-CM

## 2017-10-31 NOTE — Progress Notes (Signed)
Nurse visit

## 2017-10-31 NOTE — Patient Instructions (Signed)
° ° ° °  If you have lab work done today you will be contacted with your lab results within the next 2 weeks.  If you have not heard from us then please contact us. The fastest way to get your results is to register for My Chart. ° ° °IF you received an x-ray today, you will receive an invoice from [facility]. Please contact [facility] at [phone] with questions or concerns regarding your invoice.  ° °IF you received labwork today, you will receive an invoice from [facility]. Please contact [facility] at [phone] with questions or concerns regarding your invoice.  ° °Our billing staff will not be able to assist you with questions regarding bills from these companies. ° °You will be contacted with the lab results as soon as they are available. The fastest way to get your results is to activate your My Chart account. Instructions are located on the last page of this paperwork. If you have not heard from us regarding the results in 2 weeks, please contact this office. °  ° ° ° °

## 2017-11-01 DIAGNOSIS — H1013 Acute atopic conjunctivitis, bilateral: Secondary | ICD-10-CM | POA: Diagnosis not present

## 2017-12-25 ENCOUNTER — Encounter: Payer: Self-pay | Admitting: Emergency Medicine

## 2017-12-25 ENCOUNTER — Ambulatory Visit (INDEPENDENT_AMBULATORY_CARE_PROVIDER_SITE_OTHER): Payer: Medicare Other | Admitting: Emergency Medicine

## 2017-12-25 ENCOUNTER — Other Ambulatory Visit: Payer: Self-pay

## 2017-12-25 VITALS — BP 155/82 | HR 63 | Temp 97.6°F | Resp 16 | Ht 69.0 in | Wt 185.2 lb

## 2017-12-25 DIAGNOSIS — Z7689 Persons encountering health services in other specified circumstances: Secondary | ICD-10-CM

## 2017-12-25 DIAGNOSIS — Z0000 Encounter for general adult medical examination without abnormal findings: Secondary | ICD-10-CM

## 2017-12-25 NOTE — Progress Notes (Signed)
BP Readings from Last 3 Encounters:  06/25/17 138/70  03/02/17 114/62  12/07/16 (!) 159/86   Oscar Smith 72 y.o.   Chief Complaint  Patient presents with  . Annual Exam    HISTORY OF PRESENT ILLNESS: This is a 72 y.o. male here to establish care.  Last annual exam was on June 2019.  Chronic medical problems stable.  No medical concerns/issues were identified at that time.  Patient takes lisinopril, Lipitor, and baby aspirin daily.  No concerns there.  Compliant with medications.  Today he has no complaints or medical concerns.  HPI   Prior to Admission medications   Medication Sig Start Date End Date Taking? Authorizing Provider  aspirin EC 81 MG tablet Take 81 mg daily by mouth.   Yes [provider]  atorvastatin (LIPITOR) 10 MG tablet Take 1 tablet (10 mg total) by mouth daily. 06/25/17  Yes Wardell Honour, MD  fluticasone Asencion Islam) 50 MCG/ACT nasal spray Place 2 sprays into both nostrils daily. 06/25/17  Yes Wardell Honour, MD  ipratropium (ATROVENT) 0.03 % nasal spray Place 2 sprays into both nostrils 2 (two) times daily. 06/25/17  Yes Wardell Honour, MD  lisinopril (PRINIVIL,ZESTRIL) 20 MG tablet Take 1 tablet (20 mg total) by mouth daily. 06/25/17  Yes Wardell Honour, MD  Multiple Vitamin (MULTIVITAMIN) tablet Take 1 tablet by mouth daily.   Yes [provider]  Omega-3 Fatty Acids (FISH OIL OMEGA-3 PO) Take by mouth.   Yes [provider]  omeprazole (PRILOSEC) 20 MG capsule Take 1 capsule (20 mg total) by mouth daily. 06/25/17  Yes Wardell Honour, MD  OVER THE COUNTER MEDICATION [place] taking prn for IB   Yes [provider]    Allergies  Allergen Reactions  . Flomax [Tamsulosin Hcl]     "bad reaction" jaw pain    Patient Active Problem List   Diagnosis Date Noted  . Enlarged prostate with urinary retention 12/25/2013  . Erectile dysfunction 12/25/2013  . Incomplete bladder emptying 08/27/2013  . Allergic rhinitis  09/24/2010  . ADENOCARCINOMA, PROSTATE, GLEASON GRADE 6 12/21/2009  . GERD 11/12/2008    Past Medical History:  Diagnosis Date  . Adenocarcinoma of prostate [facility]) urologist-  dr Tresa Moore--  dx Nov 2011--  T1c, Gleason 3+3,  PSA 4.2 (post IMRT 03/2010   Low risk recurrence, Biochemical control,  last PSA 0.30 on 05-28-2014  . BPH (benign prostatic hyperplasia)   . Cancer ([place])   . Diverticulitis   . ED (erectile dysfunction) of organic origin   . GERD (gastroesophageal reflux disease)   . Hiatal hernia   . History of colon polyps    2002 and 2003 hyperplastic  . History of gastritis   . Hypertension   . Lower urinary tract symptoms (LUTS)   . Phimosis   . Sleep apnea    did not complete study.    . Wears hearing aid    bilateral    Past Surgical History:  Procedure Laterality Date  . ABDOMINAL HERNIA REPAIR    . CIRCUMCISION N/A 06/09/2015   Procedure: CIRCUMCISION ADULT WITH PENILE BLOCK;  Surgeon: Alexis Frock, MD;  Location[facility];  Service: Urology;  Laterality: N/A;  . COLONOSCOPY  last one 11-15-2011  . ENDOVENOUS ABLATION SAPHENOUS VEIN W/ LASER Left 02/10/2016   endovenous laser ablation left greater saphenous vein and stab phlebectomy left leg by Curt Jews MD  . INGUINAL HERNIA REPAIR Right 12-23-2009  . left shoulder  surgery    . LIPOMA EXCISION N/A 12/01/2016   Procedure: EXCISION POSTERIOR NECK MASS;  Surgeon: Irene Limbo, MD;  Location: [facility];  Service: Plastics;  Laterality: N/A;  . PROSTATE SURGERY  2009  . PROSTATE SURGERY    . SHOULDER ARTHROSCOPY WITH SUBACROMIAL DECOMPRESSION, ROTATOR CUFF REPAIR AND BICEP TENDON REPAIR Left 10-2014    Social History   Socioeconomic History  . Marital status: Married    Spouse name: Not on file  . Number of children: 0  . Years of education: BS  . Highest education level: Not on file  Occupational History  . Occupation: Development worker, community  . Occupation: Chief Financial Officer  Social Needs    . Financial resource strain: Not on file  . Food insecurity:    Worry: Not on file    Inability: Not on file  . Transportation needs:    Medical: Not on file    Non-medical: Not on file  Tobacco Use  . Smoking status: Former Smoker    Types: Cigarettes    Last attempt to quit: 09/04/1965    Years since quitting: 52.3  . Smokeless tobacco: Never Used  . Tobacco comment: quit around 72 yrs old.  Substance and Sexual Activity  . Alcohol use: Yes    Alcohol/week: 20.0 standard drinks    Types: 20 Standard drinks or equivalent per week    Comment: nightly  . Drug use: No  . Sexual activity: Yes    Partners: Male    Birth control/protection: Post-menopausal  Lifestyle  . Physical activity:    Days per week: Not on file    Minutes per session: Not on file  . Stress: Not on file  Relationships  . Social connections:    Talks on phone: Not on file    Gets together: Not on file    Attends religious service: Not on file    Active member of club or organization: Not on file    Attends meetings of clubs or organizations: Not on file    Relationship status: Not on file  . Intimate partner violence:    Fear of current or ex partner: Not on file    Emotionally abused: Not on file    Physically abused: Not on file    Forced sexual activity: Not on file  Other Topics Concern  . Not on file  Social History Narrative   ** Merged History Encounter **       Originally from [place].   Trained as an Chief Financial Officer.    Married '75- 66 years, divorced; Married '86. No children.    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 Sculptor-life size figures    by commission ([place] work [place]  Sculpting work regularly.   Never smoking.   Alcohol: drinks1- 2 beers and 1-2 wines every night;    Learning the piano. Patient get regular exercise.   Wife is a Marine scientist.   Caffeine: 3/day    ADLs: drives; independent with ADLs    Advanced Directives: YES; FULL CODE; no prolonged measures.         Family History  Problem Relation Age of Onset  . Stomach cancer Father   . Cancer Father 57       stomach versus colon cancer  . Pancreatic cancer Sister   . Cancer Sister 43       pancreatic cancer  . Cancer Brother  penile cancer     Review of Systems  Constitutional: Negative.  Negative for chills and fever.  HENT: Negative.  Negative for congestion, hearing loss, nosebleeds and sore throat.   Eyes: Negative.  Negative for blurred vision, double vision, discharge and redness.  Respiratory: Negative.  Negative for cough and shortness of breath.   Cardiovascular: Negative.  Negative for chest pain, palpitations and leg swelling.  Gastrointestinal: Negative.  Negative for abdominal pain, blood in stool, diarrhea, melena, nausea and vomiting.  Genitourinary: Negative.  Negative for dysuria.  Musculoskeletal: Negative.  Negative for back pain, joint pain, myalgias and neck pain.  Skin: Negative.  Negative for rash.  Neurological: Negative.  Negative for dizziness, sensory change, focal weakness and headaches.  Endo/Heme/Allergies: Negative.   All other systems reviewed and are negative.   Vitals:   12/25/17 0813  BP: (!) 155/82  Pulse: 63  Resp: 16  Temp: 97.6 F (36.4 C)  SpO2: 99%    Physical Exam  Constitutional: He is oriented to person, place, and time. He appears well-developed and well-nourished.  HENT:  Head: Normocephalic and atraumatic.  Right Ear: External ear normal.  Left Ear: External ear normal.  Nose: Nose normal.  Mouth/Throat: Oropharynx is clear and moist.  Eyes: Pupils are equal, round, and reactive to light. Conjunctivae and EOM are normal.  Neck: Normal range of motion. Neck supple. No JVD present. Carotid bruit is not present.  Cardiovascular: Normal rate, regular rhythm, normal heart sounds and intact distal pulses.  Pulmonary/Chest: Effort normal and breath sounds  normal.  Abdominal: Soft. Bowel sounds are normal. He exhibits no distension. There is no tenderness.  Musculoskeletal: Normal range of motion. He exhibits no edema or tenderness.  Lymphadenopathy:    He has no cervical adenopathy.  Neurological: He is alert and oriented to person, place, and time. No sensory deficit. He exhibits normal muscle tone. Coordination normal.  Skin: Skin is warm and dry. Capillary refill takes less than 2 seconds. No rash noted.  Psychiatric: He has a normal mood and affect. His behavior is normal.  Vitals reviewed.    ASSESSMENT & PLAN: Ann was seen today for chronic medical problem.  Diagnoses and all orders for this visit:  Encounter to establish care  Normal exam    Patient Instructions       If you have lab work done today you will be contacted with your lab results within the next 2 weeks.  If you have not heard from [place] then please contact us. The fastest way to get your results is to register for My Chart.   IF you received an x-ray today, you will receive an invoice from [facility]. Please contact [facility] at [phone] with questions or concerns regarding your invoice.   IF you received labwork today, you will receive an invoice from [facility]. Please contact [facility] at [phone] with questions or concerns regarding your invoice.   Our billing staff will not be able to assist you with questions regarding bills from these companies.  You will be contacted with the lab results as soon as they are available. The fastest way to get your results is to activate your My Chart account. Instructions are located on the last page of this paperwork. If you have not heard from [place] regarding the results in 2 weeks, please contact this office.     Health Maintenance, Male A healthy lifestyle and preventive care is important for your health and wellness. Ask your health care provider  about what schedule of regular  examinations is right for you. What should I know about weight and diet? Eat a Healthy Diet  Eat plenty of vegetables, fruits, whole grains, low-fat dairy products, and lean protein.  Do not eat a lot of foods high in solid fats, added sugars, or salt.  Maintain a Healthy Weight Regular exercise can help you achieve or maintain a healthy weight. You should:  Do at least 150 minutes of exercise each week. The exercise should increase your heart rate and make you sweat (moderate-intensity exercise).  Do strength-training exercises at least twice a week.  Watch Your Levels of Cholesterol and Blood Lipids  Have your blood tested for lipids and cholesterol every 5 years starting at 74 years of age. If you are at high risk for heart disease, you should start having your blood tested when you are 74 years old. You may need to have your cholesterol levels checked more often if: ? Your lipid or cholesterol levels are high. ? You are older than 74 years of age. ? You are at high risk for heart disease.  What should I know about cancer screening? Many types of cancers can be detected early and may often be prevented. Lung Cancer  You should be screened every year for lung cancer if: ? You are a current smoker who has smoked for at least 30 years. ? You are a former smoker who has quit within the past 15 years.  Talk to your health care provider about your screening options, when you should start screening, and how often you should be screened.  Colorectal Cancer  Routine colorectal cancer screening usually begins at 74 years of age and should be repeated every 5-10 years until you are 74 years old. You may need to be screened more often if early forms of precancerous polyps or small growths are found. Your health care provider may recommend screening at an earlier age if you have risk factors for colon cancer.  Your health care provider may recommend using home test kits to check for hidden  blood in the stool.  A small camera at the end of a tube can be used to examine your colon (sigmoidoscopy or colonoscopy). This checks for the earliest forms of colorectal cancer.  Prostate and Testicular Cancer  Depending on your age and overall health, your health care provider may do certain tests to screen for prostate and testicular cancer.  Talk to your health care provider about any symptoms or concerns you have about testicular or prostate cancer.  Skin Cancer  Check your skin from head to toe regularly.  Tell your health care provider about any new moles or changes in moles, especially if: ? There is a change in a mole's size, shape, or color. ? You have a mole that is larger than a pencil eraser.  Always use sunscreen. Apply sunscreen liberally and repeat throughout the day.  Protect yourself by wearing long sleeves, pants, a wide-brimmed hat, and sunglasses when outside.  What should I know about heart disease, diabetes, and high blood pressure?  If you are 69-86 years of age, have your blood pressure checked every 3-5 years. If you are 54 years of age or older, have your blood pressure checked every year. You should have your blood pressure measured twice-once when you are at a hospital or clinic, and once when you are not at a hospital or clinic. Record the average of the two measurements. To check your blood pressure  when you are not at a hospital or clinic, you can use: ? An automated blood pressure machine at a pharmacy. ? A home blood pressure monitor.  Talk to your health care provider about your target blood pressure.  If you are between 36-43 years old, ask your health care provider if you should take aspirin to prevent heart disease.  Have regular diabetes screenings by checking your fasting blood sugar level. ? If you are at a normal weight and have a low risk for diabetes, have this test once every three years after the age of 39. ? If you are overweight and  have a high risk for diabetes, consider being tested at a younger age or more often.  A one-time screening for abdominal aortic aneurysm (AAA) by ultrasound is recommended for men aged 21-75 years who are current or former smokers. What should I know about preventing infection? Hepatitis B If you have a higher risk for hepatitis B, you should be screened for this virus. Talk with your health care provider to find out if you are at risk for hepatitis B infection. Hepatitis C Blood testing is recommended for:  Everyone born from [birth year] through 1967.  Anyone with known risk factors for hepatitis C.  Sexually Transmitted Diseases (STDs)  You should be screened each year for STDs including gonorrhea and chlamydia if: ? You are sexually active and are younger than 74 years of age. ? You are older than 74 years of age and your health care provider tells you that you are at risk for this type of infection. ? Your sexual activity has changed since you were last screened and you are at an increased risk for chlamydia or gonorrhea. Ask your health care provider if you are at risk.  Talk with your health care provider about whether you are at high risk of being infected with HIV. Your health care provider may recommend a prescription medicine to help prevent HIV infection.  What else can I do?  Schedule regular health, dental, and eye exams.  Stay current with your vaccines (immunizations).  Do not use any tobacco products, such as cigarettes, chewing tobacco, and e-cigarettes. If you need help quitting, ask your health care provider.  Limit alcohol intake to no more than 2 drinks per day. One drink equals 12 ounces of beer, 5 ounces of wine, or 1 ounces of hard liquor.  Do not use street drugs.  Do not share needles.  Ask your health care provider for help if you need support or information about quitting drugs.  Tell your health care provider if you often feel depressed.  Tell your  health care provider if you have ever been abused or do not feel safe at home. This information is not intended to replace advice given to you by your health care provider. Make sure you discuss any questions you have with your health care provider. Document Released: 07/08/2007 Document Revised: 09/08/2015 Document Reviewed: 10/13/2014 Elsevier Interactive Patient Education  2018 Elsevier Inc.      Agustina Caroli, MD [facility]

## 2017-12-25 NOTE — Patient Instructions (Addendum)

## 2017-12-27 DIAGNOSIS — D1801 Hemangioma of skin and subcutaneous tissue: Secondary | ICD-10-CM | POA: Diagnosis not present

## 2017-12-27 DIAGNOSIS — L820 Inflamed seborrheic keratosis: Secondary | ICD-10-CM | POA: Diagnosis not present

## 2017-12-27 DIAGNOSIS — D485 Neoplasm of uncertain behavior of skin: Secondary | ICD-10-CM | POA: Diagnosis not present

## 2017-12-27 DIAGNOSIS — L821 Other seborrheic keratosis: Secondary | ICD-10-CM | POA: Diagnosis not present

## 2018-01-29 DIAGNOSIS — J342 Deviated nasal septum: Secondary | ICD-10-CM | POA: Diagnosis not present

## 2018-01-29 DIAGNOSIS — H6123 Impacted cerumen, bilateral: Secondary | ICD-10-CM | POA: Diagnosis not present

## 2018-01-29 DIAGNOSIS — H903 Sensorineural hearing loss, bilateral: Secondary | ICD-10-CM | POA: Diagnosis not present

## 2018-01-29 DIAGNOSIS — J310 Chronic rhinitis: Secondary | ICD-10-CM | POA: Diagnosis not present

## 2018-02-07 ENCOUNTER — Ambulatory Visit (INDEPENDENT_AMBULATORY_CARE_PROVIDER_SITE_OTHER): Payer: Medicare Other

## 2018-02-07 ENCOUNTER — Ambulatory Visit (INDEPENDENT_AMBULATORY_CARE_PROVIDER_SITE_OTHER): Payer: Medicare Other | Admitting: Emergency Medicine

## 2018-02-07 ENCOUNTER — Other Ambulatory Visit: Payer: Self-pay

## 2018-02-07 ENCOUNTER — Encounter: Payer: Self-pay | Admitting: Emergency Medicine

## 2018-02-07 VITALS — BP 156/74 | HR 58 | Temp 97.7°F | Resp 16 | Ht 69.0 in | Wt 188.2 lb

## 2018-02-07 DIAGNOSIS — R1031 Right lower quadrant pain: Secondary | ICD-10-CM

## 2018-02-07 DIAGNOSIS — M1611 Unilateral primary osteoarthritis, right hip: Secondary | ICD-10-CM

## 2018-02-07 DIAGNOSIS — M25551 Pain in right hip: Secondary | ICD-10-CM

## 2018-02-07 MED ORDER — TRAMADOL HCL 50 MG PO TABS
50.0000 mg | ORAL_TABLET | Freq: Three times a day (TID) | ORAL | 0 refills | Status: DC | PRN
Start: 2018-02-07 — End: 2018-11-25

## 2018-02-07 NOTE — Patient Instructions (Addendum)
     If you have lab work done today you will be contacted with your lab results within the next 2 weeks.  If you have not heard from [place] then please contact us. The fastest way to get your results is to register for My Chart.   IF you received an x-ray today, you will receive an invoice from [facility]. Please contact [facility] at [phone] with questions or concerns regarding your invoice.   IF you received labwork today, you will receive an invoice from [facility]. Please contact [facility] at [phone] with questions or concerns regarding your invoice.   Our billing staff will not be able to assist you with questions regarding bills from these companies.  You will be contacted with the lab results as soon as they are available. The fastest way to get your results is to activate your My Chart account. Instructions are located on the last page of this paperwork. If you have not heard from [place] regarding the results in 2 weeks, please contact this office.     Hip Pain  The hip is the joint between the upper legs and the lower pelvis. The bones, cartilage, tendons, and muscles of your hip joint support your body and allow you to move around. Hip pain can range from a minor ache to severe pain in one or both of your hips. The pain may be felt on the inside of the hip joint near the groin, or the outside near the buttocks and upper thigh. You may also have swelling or stiffness. Follow these instructions at home: Managing pain, stiffness, and swelling  If directed, apply ice to the injured area. ? Put ice in a plastic bag. ? Place a towel between your skin and the bag. ? Leave the ice on for 20 minutes, 2-3 times a day  Sleep with a pillow between your legs on your most comfortable side.  Avoid any activities that cause pain. General instructions  Take over-the-counter and prescription medicines only as told by your health care provider.  Do any exercises as told  by your health care provider.  Record the following: ? How often you have hip pain. ? The location of your pain. ? What the pain feels like. ? What makes the pain worse.  Keep all follow-up visits as told by your health care provider. This is important. Contact a health care provider if:  You cannot put weight on your leg.  Your pain or swelling continues or gets worse after one week.  It gets harder to walk.  You have a fever. Get help right away if:  You fall.  You have a sudden increase in pain and swelling in your hip.  Your hip is red or swollen or very tender to touch. Summary  Hip pain can range from a minor ache to severe pain in one or both of your hips.  The pain may be felt on the inside of the hip joint near the groin, or the outside near the buttocks and upper thigh.  Avoid any activities that cause pain.  Record how often you have hip pain, the location of the pain, what makes it worse and what it feels like. This information is not intended to replace advice given to you by your health care provider. Make sure you discuss any questions you have with your health care provider. Document Released: 06/29/2009 Document Revised: 12/13/2015 Document Reviewed: 12/13/2015 Elsevier Interactive Patient Education  2019 [employer].

## 2018-02-07 NOTE — Progress Notes (Signed)
Oscar Smith 73 y.o.   Chief Complaint  Patient presents with  . Hip Pain    RIGHT and to the groin - per patient started this morning    HISTORY OF PRESENT ILLNESS: This is a 73 y.o. male complaining of several months history of intermittent pain to his right hip, sharp at times, radiating to right groin area with no other associated symptoms.  HPI   Prior to Admission medications   Medication Sig Start Date End Date Taking? Authorizing Provider  aspirin EC 81 MG tablet Take 81 mg daily by mouth.   Yes [provider]  atorvastatin (LIPITOR) 10 MG tablet Take 1 tablet (10 mg total) by mouth daily. 06/25/17  Yes Wardell Honour, MD  fluticasone Asencion Islam) 50 MCG/ACT nasal spray Place 2 sprays into both nostrils daily. 06/25/17  Yes Wardell Honour, MD  lisinopril (PRINIVIL,ZESTRIL) 20 MG tablet Take 1 tablet (20 mg total) by mouth daily. 06/25/17  Yes Wardell Honour, MD  Multiple Vitamin (MULTIVITAMIN) tablet Take 1 tablet by mouth daily.   Yes [provider]  Omega-3 Fatty Acids (FISH OIL OMEGA-3 PO) Take by mouth.   Yes [provider]  omeprazole (PRILOSEC) 20 MG capsule Take 1 capsule (20 mg total) by mouth daily. 06/25/17  Yes Wardell Honour, MD  ipratropium (ATROVENT) 0.03 % nasal spray Place 2 sprays into both nostrils 2 (two) times daily. Patient not taking: Reported on 02/07/2018 06/25/17   Wardell Honour, MD  OVER THE COUNTER MEDICATION OTC Ultraflora taking prn for IB    [provider]    Allergies  Allergen Reactions  . Flomax [Tamsulosin Hcl]     "bad reaction" jaw pain    Patient Active Problem List   Diagnosis Date Noted  . Enlarged prostate with urinary retention 12/25/2013  . Erectile dysfunction 12/25/2013  . Incomplete bladder emptying 08/27/2013  . Allergic rhinitis 09/24/2010  . ADENOCARCINOMA, PROSTATE, GLEASON GRADE 6 12/21/2009  . GERD 11/12/2008    Past Medical History:  Diagnosis Date  . Adenocarcinoma of  prostate [facility] urologist-  dr Tresa Moore--  dx Nov 2011--  T1c, Gleason 3+3,  PSA 4.2 (post IMRT 03/2010   Low risk recurrence, Biochemical control,  last PSA 0.30 on 05-28-2014  . BPH (benign prostatic hyperplasia)   . Cancer ([place])   . Diverticulitis   . ED (erectile dysfunction) of organic origin   . GERD (gastroesophageal reflux disease)   . Hiatal hernia   . History of colon polyps    2002 and 2003 hyperplastic  . History of gastritis   . Hypertension   . Lower urinary tract symptoms (LUTS)   . Phimosis   . Sleep apnea    did not complete study.    . Wears hearing aid    bilateral    Past Surgical History:  Procedure Laterality Date  . ABDOMINAL HERNIA REPAIR    . CIRCUMCISION N/A 06/09/2015   Procedure: CIRCUMCISION ADULT WITH PENILE BLOCK;  Surgeon: Alexis Frock, MD;  [facility];  Service: Urology;  Laterality: N/A;  . COLONOSCOPY  last one 11-15-2011  . ENDOVENOUS ABLATION SAPHENOUS VEIN W/ LASER Left 02/10/2016   endovenous laser ablation left greater saphenous vein and stab phlebectomy left leg by Curt Jews MD  . INGUINAL HERNIA REPAIR Right 12-23-2009  . left shoulder surgery    . LIPOMA EXCISION N/A 12/01/2016   Procedure: EXCISION POSTERIOR NECK MASS;  Surgeon: Irene Limbo, MD;  Location: [facility];  Service: Plastics;  Laterality: N/A;  . PROSTATE SURGERY  2009  . PROSTATE SURGERY    . SHOULDER ARTHROSCOPY WITH SUBACROMIAL DECOMPRESSION, ROTATOR CUFF REPAIR AND BICEP TENDON REPAIR Left 10-2014    Social History   Socioeconomic History  . Marital status: Married    Spouse name: Not on file  . Number of children: 0  . Years of education: BS  . Highest education level: Not on file  Occupational History  . Occupation: Development worker, community  . Occupation: Chief Financial Officer  Social Needs  . Financial resource strain: Not on file  . Food insecurity:    Worry: Not on file    Inability: Not on file  . Transportation needs:    Medical:  Not on file    Non-medical: Not on file  Tobacco Use  . Smoking status: Former Smoker    Types: Cigarettes    Last attempt to quit: 09/04/1965    Years since quitting: 52.4  . Smokeless tobacco: Never Used  . Tobacco comment: quit around 73 yrs old.  Substance and Sexual Activity  . Alcohol use: Yes    Alcohol/week: 20.0 standard drinks    Types: 20 Standard drinks or equivalent per week    Comment: nightly  . Drug use: No  . Sexual activity: Yes    Partners: Male    Birth control/protection: Post-menopausal  Lifestyle  . Physical activity:    Days per week: Not on file    Minutes per session: Not on file  . Stress: Not on file  Relationships  . Social connections:    Talks on phone: Not on file    Gets together: Not on file    Attends religious service: Not on file    Active member of club or organization: Not on file    Attends meetings of clubs or organizations: Not on file    Relationship status: Not on file  . Intimate partner violence:    Fear of current or ex partner: Not on file    Emotionally abused: Not on file    Physically abused: Not on file    Forced sexual activity: Not on file  Other Topics Concern  . Not on file  Social History Narrative   ** Merged History Encounter **       Originally from [place].   Trained as an Chief Financial Officer.    Married '75- 66 years, divorced; Married '86. No children.    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 Sculptor-life size figures    by commission (see work in [place]  Sculpting work regularly.   Never smoking.   Alcohol: drinks1- 2 beers and 1-2 wines every night;    Learning the piano. Patient get regular exercise.   Wife is a Marine scientist.   Caffeine: 3/day    ADLs: drives; independent with ADLs   Advanced Directives: YES; FULL CODE; no prolonged measures.         Family History  Problem Relation Age of Onset  . Stomach cancer Father   .  Cancer Father 42       stomach versus colon cancer  . Pancreatic cancer Sister   . Cancer Sister 1       pancreatic cancer  . Cancer Brother        penile cancer     Review of Systems  Constitutional: Negative.  Negative for chills and fever.  HENT: Negative.   Eyes:  Negative.   Respiratory: Negative.  Negative for shortness of breath.   Cardiovascular: Negative for chest pain and palpitations.  Gastrointestinal: Negative for abdominal pain, blood in stool, diarrhea, melena, nausea and vomiting.  Genitourinary: Negative for dysuria and hematuria.  Musculoskeletal: Positive for joint pain (Right hip).  Skin: Negative.  Negative for rash.  Neurological: Negative.  Negative for dizziness, sensory change, focal weakness, weakness and headaches.  Endo/Heme/Allergies: Negative.   All other systems reviewed and are negative.  Vitals:   02/07/18 0958  BP: (!) 156/74  Pulse: (!) 58  Resp: 16  Temp: 97.7 F (36.5 C)  SpO2: 98%     Physical Exam Vitals signs reviewed.  Constitutional:      Appearance: Normal appearance.  HENT:     Head: Normocephalic and atraumatic.     Mouth/Throat:     Mouth: Mucous membranes are moist.     Pharynx: Oropharynx is clear.  Eyes:     Extraocular Movements: Extraocular movements intact.     Pupils: Pupils are equal, round, and reactive to light.  Neck:     Musculoskeletal: Normal range of motion and neck supple.  Cardiovascular:     Rate and Rhythm: Normal rate and regular rhythm.  Pulmonary:     Effort: Pulmonary effort is normal.     Breath sounds: Normal breath sounds.  Abdominal:     General: There is no distension.     Palpations: There is no mass.     Tenderness: There is no abdominal tenderness.     Hernia: No hernia is present. There is no hernia in the right inguinal area or left inguinal area.  Genitourinary:    Penis: Normal and circumcised.      Scrotum/Testes: Normal.        Right: Tenderness not present.        Left:  Tenderness not present.    Neurological:     Mental Status: He is alert.     Dg Hip Unilat W Or W/o Pelvis 2-3 Views Right  Result Date: 02/07/2018 CLINICAL DATA:  Right hip pain. EXAM: DG HIP (WITH OR WITHOUT PELVIS) 2-3V RIGHT COMPARISON:  Right hip radiographs-11/27/2016 FINDINGS: No fracture or dislocation. Mild-to-moderate degenerative change of the right hip with joint space loss and subchondral sclerosis. No evidence of avascular necrosis. Limited visualization of the pelvis is normal. Similar mild to moderate degenerative changes suspected within the contralateral left hip though incompletely evaluated. Degenerative change of the lower lumbar spine is suspected though incompletely evaluated. Surgical clips overlie the expected location of the prostate gland. Regional soft tissues appear otherwise normal. IMPRESSION: Mild-to-moderate degenerative change of the right hip. Electronically Signed   By: Sandi Mariscal M.D.   On: 02/07/2018 11:20   A total of 25 minutes was spent in the room with the patient, greater than 50% of which was in counseling/coordination of care regarding diagnosis, treatment, medication, prognosis, and need for follow-up.  ASSESSMENT & PLAN: Alondra was seen today for hip pain.  Diagnoses and all orders for this visit:  Right hip pain -     DG HIP UNILAT W OR W/O PELVIS 2-3 VIEWS RIGHT; Future -     traMADol (ULTRAM) 50 MG tablet; Take 1 tablet (50 mg total) by mouth every 8 (eight) hours as needed for severe pain.  Right groin pain -     traMADol (ULTRAM) 50 MG tablet; Take 1 tablet (50 mg total) by mouth every 8 (eight) hours as needed for severe pain.  Osteoarthritis of right hip, unspecified osteoarthritis type    Patient Instructions       If you have lab work done today you will be contacted with your lab results within the next 2 weeks.  If you have not heard from [place] then please contact us. The fastest way to get your results is to register for My  Chart.   IF you received an x-ray today, you will receive an invoice from [facility]. Please contact [facility] at [phone] with questions or concerns regarding your invoice.   IF you received labwork today, you will receive an invoice from [facility]. Please contact [facility] at [phone] with questions or concerns regarding your invoice.   Our billing staff will not be able to assist you with questions regarding bills from these companies.  You will be contacted with the lab results as soon as they are available. The fastest way to get your results is to activate your My Chart account. Instructions are located on the last page of this paperwork. If you have not heard from [place] regarding the results in 2 weeks, please contact this office.     Hip Pain  The hip is the joint between the upper legs and the lower pelvis. The bones, cartilage, tendons, and muscles of your hip joint support your body and allow you to move around. Hip pain can range from a minor ache to severe pain in one or both of your hips. The pain may be felt on the inside of the hip joint near the groin, or the outside near the buttocks and upper thigh. You may also have swelling or stiffness. Follow these instructions at home: Managing pain, stiffness, and swelling  If directed, apply ice to the injured area. ? Put ice in a plastic bag. ? Place a towel between your skin and the bag. ? Leave the ice on for 20 minutes, 2-3 times a day  Sleep with a pillow between your legs on your most comfortable side.  Avoid any activities that cause pain. General instructions  Take over-the-counter and prescription medicines only as told by your health care provider.  Do any exercises as told by your health care provider.  Record the following: ? How often you have hip pain. ? The location of your pain. ? What the pain feels like. ? What makes the pain worse.  Keep all follow-up visits as told by your  health care provider. This is important. Contact a health care provider if:  You cannot put weight on your leg.  Your pain or swelling continues or gets worse after one week.  It gets harder to walk.  You have a fever. Get help right away if:  You fall.  You have a sudden increase in pain and swelling in your hip.  Your hip is red or swollen or very tender to touch. Summary  Hip pain can range from a minor ache to severe pain in one or both of your hips.  The pain may be felt on the inside of the hip joint near the groin, or the outside near the buttocks and upper thigh.  Avoid any activities that cause pain.  Record how often you have hip pain, the location of the pain, what makes it worse and what it feels like. This information is not intended to replace advice given to you by your health care provider. Make sure you discuss any questions you have with your health care provider. Document Released: 06/29/2009 Document Revised:  12/13/2015 Document Reviewed: 12/13/2015 Elsevier Interactive Patient Education  2019 Elsevier Inc.      Agustina Caroli, MD [facility]

## 2018-02-08 ENCOUNTER — Ambulatory Visit: Payer: Medicare Other | Admitting: Emergency Medicine

## 2018-02-26 DIAGNOSIS — H2513 Age-related nuclear cataract, bilateral: Secondary | ICD-10-CM | POA: Diagnosis not present

## 2018-04-12 ENCOUNTER — Encounter: Payer: Self-pay | Admitting: *Deleted

## 2018-05-28 DIAGNOSIS — C61 Malignant neoplasm of prostate: Secondary | ICD-10-CM | POA: Diagnosis not present

## 2018-06-04 DIAGNOSIS — N5201 Erectile dysfunction due to arterial insufficiency: Secondary | ICD-10-CM | POA: Diagnosis not present

## 2018-06-04 DIAGNOSIS — C61 Malignant neoplasm of prostate: Secondary | ICD-10-CM | POA: Diagnosis not present

## 2018-06-04 DIAGNOSIS — R350 Frequency of micturition: Secondary | ICD-10-CM | POA: Diagnosis not present

## 2018-06-26 ENCOUNTER — Ambulatory Visit: Payer: Medicare Other | Admitting: Emergency Medicine

## 2018-06-28 ENCOUNTER — Encounter: Payer: Medicare Other | Admitting: Emergency Medicine

## 2018-06-28 ENCOUNTER — Telehealth: Payer: Self-pay | Admitting: Emergency Medicine

## 2018-06-28 NOTE — Telephone Encounter (Signed)
PATIENT WOULD LIKE TO RESCHEDULE HIS WELL VISIT  [name]   Copied from [place] [phone]. Topic: Quick Communication - Appointment Cancellation >> Jun 28, 2018 12:10 PM Oscar Smith wrote: Patient called to cancel appointment scheduled for 07/02/18 Patient has not rescheduled their appointment. Pt has appt scheduled for 07/02/18 and 07/03/18. Pt only wants to come on 07/03/18.  He does not want to come on his birthday.  Attempted to call office, could not get through    Route to department's PEC pool.

## 2018-07-02 ENCOUNTER — Telehealth: Payer: Self-pay | Admitting: *Deleted

## 2018-07-02 ENCOUNTER — Ambulatory Visit: Payer: Medicare Other | Admitting: Emergency Medicine

## 2018-07-02 NOTE — Telephone Encounter (Signed)
Schedule AWV.  

## 2018-07-03 ENCOUNTER — Telehealth (INDEPENDENT_AMBULATORY_CARE_PROVIDER_SITE_OTHER): Payer: Medicare Other | Admitting: Emergency Medicine

## 2018-07-03 ENCOUNTER — Encounter: Payer: Self-pay | Admitting: Emergency Medicine

## 2018-07-03 VITALS — Ht 69.0 in | Wt 185.0 lb

## 2018-07-03 DIAGNOSIS — I10 Essential (primary) hypertension: Secondary | ICD-10-CM | POA: Insufficient documentation

## 2018-07-03 DIAGNOSIS — E7800 Pure hypercholesterolemia, unspecified: Secondary | ICD-10-CM | POA: Insufficient documentation

## 2018-07-03 MED ORDER — LISINOPRIL 20 MG PO TABS: 30.0000 mg | ORAL_TABLET | Freq: Every day | ORAL | 3 refills | Status: DC

## 2018-07-03 NOTE — Progress Notes (Signed)
Telemedicine Encounter- SOAP NOTE Established Patient  This telephone encounter was conducted with the patient's (or proxy's) verbal consent via audio telecommunications: yes/no: Yes Patient was instructed to have this encounter in a suitably private space; and to only have persons present to whom they give permission to participate. In addition, patient identity was confirmed by use of name plus two identifiers (DOB and address).  I discussed the limitations, risks, security and privacy concerns of performing an evaluation and management service by telephone and the availability of in person appointments. I also discussed with the patient that there may be a patient responsible charge related to this service. The patient expressed understanding and agreed to proceed.  I spent a total of TIME; 0 MIN TO 60 MIN: 15 minutes talking with the patient or their proxy.  No chief complaint on file. Hypertension follow-up  Subjective   Oscar Smith is a 72 y.o. male established patient. Telephone visit today for follow-up on chronic medical problems including hypertension.  Osteoarthritis doing well.  Has increased physical activity, increase walking.  Doing better.  Denies urinary problems at present time.  Blood pressure readings at home have been on the high side [id number] systolics and diastolics in the 94W.  Takes lisinopril 20 mg a day.  No other complaints or medical concerns today.  Wife also helping with the history taking.  She is a retired Therapist, sports.  HPI   Patient Active Problem List   Diagnosis Date Noted  . Osteoarthritis of right hip 02/07/2018  . Enlarged prostate with urinary retention 12/25/2013  . Erectile dysfunction 12/25/2013  . Incomplete bladder emptying 08/27/2013  . ADENOCARCINOMA, PROSTATE, GLEASON GRADE 6 12/21/2009  . GERD 11/12/2008    Past Medical History:  Diagnosis Date  . Adenocarcinoma of prostate [facility]) urologist-  dr Tresa Moore--  dx Nov 2011--  T1c, Gleason  3+3,  PSA 4.2 (post IMRT 03/2010   Low risk recurrence, Biochemical control,  last PSA 0.30 on 05-28-2014  . BPH (benign prostatic hyperplasia)   . Cancer ([place])   . Diverticulitis   . ED (erectile dysfunction) of organic origin   . GERD (gastroesophageal reflux disease)   . Hiatal hernia   . History of colon polyps    2002 and 2003 hyperplastic  . History of gastritis   . Hypertension   . Lower urinary tract symptoms (LUTS)   . Phimosis   . Sleep apnea    did not complete study.    . Wears hearing aid    bilateral    Current Outpatient Medications  Medication Sig Dispense Refill  . aspirin EC 81 MG tablet Take 81 mg daily by mouth.    Marland Kitchen atorvastatin (LIPITOR) 10 MG tablet Take 1 tablet (10 mg total) by mouth daily. 90 tablet 3  . fluticasone (FLONASE) 50 MCG/ACT nasal spray Place 2 sprays into both nostrils daily. 48 g 3  . lisinopril (PRINIVIL,ZESTRIL) 20 MG tablet Take 1 tablet (20 mg total) by mouth daily. 90 tablet 3  . Multiple Vitamin (MULTIVITAMIN) tablet Take 1 tablet by mouth daily.    . Omega-3 Fatty Acids (FISH OIL OMEGA-3 PO) Take by mouth.    Marland Kitchen omeprazole (PRILOSEC) 20 MG capsule Take 1 capsule (20 mg total) by mouth daily. 90 capsule 3  . ipratropium (ATROVENT) 0.03 % nasal spray Place 2 sprays into both nostrils 2 (two) times daily. (Patient not taking: Reported on 02/07/2018) 30 mL 0  . OVER THE COUNTER MEDICATION OTC Ultraflora taking prn for  IB    . traMADol (ULTRAM) 50 MG tablet Take 1 tablet (50 mg total) by mouth every 8 (eight) hours as needed for severe pain. (Patient not taking: Reported on 07/03/2018) 15 tablet 0   No current facility-administered medications for this visit.     Allergies  Allergen Reactions  . Flomax [Tamsulosin Hcl]     "bad reaction" jaw pain    Social History   Socioeconomic History  . Marital status: Married    Spouse name: Not on file  . Number of children: 0  . Years of education: BS  . Highest education level: Not on  file  Occupational History  . Occupation: Development worker, community  . Occupation: Chief Financial Officer  Social Needs  . Financial resource strain: Not on file  . Food insecurity:    Worry: Not on file    Inability: Not on file  . Transportation needs:    Medical: Not on file    Non-medical: Not on file  Tobacco Use  . Smoking status: Former Smoker    Types: Cigarettes    Last attempt to quit: 09/04/1965    Years since quitting: 52.8  . Smokeless tobacco: Never Used  . Tobacco comment: quit around 73 yrs old.  Substance and Sexual Activity  . Alcohol use: Yes    Alcohol/week: 20.0 standard drinks    Types: 20 Standard drinks or equivalent per week    Comment: nightly  . Drug use: No  . Sexual activity: Yes    Partners: Male    Birth control/protection: Post-menopausal  Lifestyle  . Physical activity:    Days per week: Not on file    Minutes per session: Not on file  . Stress: Not on file  Relationships  . Social connections:    Talks on phone: Not on file    Gets together: Not on file    Attends religious service: Not on file    Active member of club or organization: Not on file    Attends meetings of clubs or organizations: Not on file    Relationship status: Not on file  . Intimate partner violence:    Fear of current or ex partner: Not on file    Emotionally abused: Not on file    Physically abused: Not on file    Forced sexual activity: Not on file  Other Topics Concern  . Not on file  Social History Narrative   ** Merged History Encounter **       Originally from [place].   Trained as an Chief Financial Officer.    Married '75- 58 years, divorced; Married '86. No children.    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 Sculptor-life size figures    by commission (see work in [place]  Sculpting work regularly.   Never smoking.   Alcohol: drinks1- 2 beers and 1-2 wines every night;    Learning the piano. Patient get  regular exercise.   Wife is a Marine scientist.   Caffeine: 3/day    ADLs: drives; independent with ADLs   Advanced Directives: YES; FULL CODE; no prolonged measures.         Review of Systems  Constitutional: Negative.  Negative for chills and fever.  HENT: Negative.  Negative for congestion and sore throat.   Eyes: Negative.   Respiratory: Negative for cough and shortness of breath.   Cardiovascular: Negative.  Negative for chest pain and palpitations.  Gastrointestinal: Negative.  Negative for  abdominal pain, diarrhea, nausea and vomiting.  Musculoskeletal: Negative.  Negative for myalgias and neck pain.  Skin: Negative.  Negative for rash.  Neurological: Negative for dizziness and headaches.  Endo/Heme/Allergies: Negative.   All other systems reviewed and are negative.   Objective   Vitals as reported by the patient: Today's Vitals   07/03/18 0754  Weight: 185 lb (83.9 kg)  Height: 5\' 9"  (1.753 m)  Awake and oriented x3 in no respiratory distress.  There are no diagnoses linked to this encounter. Essential hypertension Uncontrolled hypertension.  Will increase lisinopril to 30 mg a day.  Advised to to continue monitoring blood pressure at home.  May need to go up to 40 mg a day.  Scheduled for blood work. Office visit in 2 to 3 months.  Diagnoses and all orders for this visit:  Essential hypertension -     lisinopril (ZESTRIL) 20 MG tablet; Take 1.5 tablets (30 mg total) by mouth daily. -     Comprehensive metabolic panel; Future -     CBC with Differential/Platelet; Future  Pure hypercholesterolemia -     Lipid panel; Future     I discussed the assessment and treatment plan with the patient. The patient was provided an opportunity to ask questions and all were answered. The patient agreed with the plan and demonstrated an understanding of the instructions.   The patient was advised to call back or seek an in-person evaluation if the symptoms worsen or if the condition  fails to improve as anticipated.  I provided 15 minutes of non-face-to-face time during this encounter.  Horald Pollen, MD  Primary Care at [facility]

## 2018-07-03 NOTE — Progress Notes (Signed)
APPNT SCHEDULED  °

## 2018-07-03 NOTE — Progress Notes (Signed)
Called patient to triage for appointment. Patient having an annual physical today. Patient states he feels tired sometimes and he walks 1 mile every other day. Patient states he needs a refill on Lisinopril.

## 2018-07-03 NOTE — Assessment & Plan Note (Signed)
Uncontrolled hypertension.  Will increase lisinopril to 30 mg a day.  Advised to to continue monitoring blood pressure at home.  May need to go up to 40 mg a day.  Scheduled for blood work. Office visit in 2 to 3 months.

## 2018-07-08 ENCOUNTER — Encounter: Payer: Self-pay | Admitting: *Deleted

## 2018-07-10 ENCOUNTER — Other Ambulatory Visit: Payer: Self-pay

## 2018-07-10 ENCOUNTER — Ambulatory Visit (INDEPENDENT_AMBULATORY_CARE_PROVIDER_SITE_OTHER): Payer: Medicare Other | Admitting: Family Medicine

## 2018-07-10 VITALS — BP 154/93 | HR 69 | Wt 185.0 lb

## 2018-07-10 DIAGNOSIS — Z0000 Encounter for general adult medical examination without abnormal findings: Secondary | ICD-10-CM

## 2018-07-10 NOTE — Patient Instructions (Signed)
Thank you for taking time to come for your Medicare Wellness Visit. I appreciate your ongoing commitment to your health goals. Please review the following plan we discussed and let me know if I can assist you in the future.  Oscar Kennedy LPN  Preventive Care 75 Years and Older, Male Preventive care refers to lifestyle choices and visits with your health care provider that can promote health and wellness. What does preventive care include?   A yearly physical exam. This is also called an annual well check.  Dental exams once or twice a year.  Routine eye exams. Ask your health care provider how often you should have your eyes checked.  Personal lifestyle choices, including: ? Daily care of your teeth and gums. ? Regular physical activity. ? Eating a healthy diet. ? Avoiding tobacco and drug use. ? Limiting alcohol use. ? Practicing safe sex. ? Taking low doses of aspirin every day. ? Taking vitamin and mineral supplements as recommended by your health care provider. What happens during an annual well check? The services and screenings done by your health care provider during your annual well check will depend on your age, overall health, lifestyle risk factors, and family history of disease. Counseling Your health care provider may ask you questions about your:  Alcohol use.  Tobacco use.  Drug use.  Emotional well-being.  Home and relationship well-being.  Sexual activity.  Eating habits.  History of falls.  Memory and ability to understand (cognition).  Work and work Statistician. Screening You may have the following tests or measurements:  Height, weight, and BMI.  Blood pressure.  Lipid and cholesterol levels. These may be checked every 5 years, or more frequently if you are over 12 years old.  Skin check.  Lung cancer screening. You may have this screening every year starting at age 56 if you have a 30-pack-year history of smoking and currently smoke  or have quit within the past 15 years.  Colorectal cancer screening. All adults should have this screening starting at age 8 and continuing until age 65. You will have tests every 1-10 years, depending on your results and the type of screening test. People at increased risk should start screening at an earlier age. Screening tests may include: ? Guaiac-based fecal occult blood testing. ? Fecal immunochemical test (FIT). ? Stool DNA test. ? Virtual colonoscopy. ? Sigmoidoscopy. During this test, a flexible tube with a tiny camera (sigmoidoscope) is used to examine your rectum and lower colon. The sigmoidoscope is inserted through your anus into your rectum and lower colon. ? Colonoscopy. During this test, a long, thin, flexible tube with a tiny camera (colonoscope) is used to examine your entire colon and rectum.  Prostate cancer screening. Recommendations will vary depending on your family history and other risks.  Hepatitis C blood test.  Hepatitis B blood test.  Sexually transmitted disease (STD) testing.  Diabetes screening. This is done by checking your blood sugar (glucose) after you have not eaten for a while (fasting). You may have this done every 1-3 years.  Abdominal aortic aneurysm (AAA) screening. You may need this if you are a current or former smoker.  Osteoporosis. You may be screened starting at age 55 if you are at high risk. Talk with your health care provider about your test results, treatment options, and if necessary, the need for more tests. Vaccines Your health care provider may recommend certain vaccines, such as:  Influenza vaccine. This is recommended every year.  Tetanus, diphtheria, and acellular pertussis (Tdap, Td) vaccine. You may need a Td booster every 10 years.  Varicella vaccine. You may need this if you have not been vaccinated.  Zoster vaccine. You may need this after age 14.  Measles, mumps, and rubella (MMR) vaccine. You may need at least one  dose of MMR if you were born in 1957 or later. You may also need a second dose.  Pneumococcal 13-valent conjugate (PCV13) vaccine. One dose is recommended after age 38.  Pneumococcal polysaccharide (PPSV23) vaccine. One dose is recommended after age 87.  Meningococcal vaccine. You may need this if you have certain conditions.  Hepatitis A vaccine. You may need this if you have certain conditions or if you travel or work in places where you may be exposed to hepatitis A.  Hepatitis B vaccine. You may need this if you have certain conditions or if you travel or work in places where you may be exposed to hepatitis B.  Haemophilus influenzae type b (Hib) vaccine. You may need this if you have certain risk factors. Talk to your health care provider about which screenings and vaccines you need and how often you need them. This information is not intended to replace advice given to you by your health care provider. Make sure you discuss any questions you have with your health care provider. Document Released: 02/05/2015 Document Revised: 03/01/2017 Document Reviewed: 11/10/2014 Elsevier Interactive Patient Education  2019 [employer].

## 2018-07-10 NOTE — Progress Notes (Signed)
Presents today for [employer] Visit   Date of last exam: 07/03/2018  Interpreter used for this visit? no  I connected with  Oscar Smith on 07/10/18 by telephone  and verified that I am speaking with the correct person using two identifiers.   I discussed the limitations of evaluation and management by telemedicine. The patient expressed understanding and agreed to proceed.   Patient Care Team: Horald Pollen, MD as PCP - General (Internal Medicine) Wardell Honour, MD as Consulting Physician (Family Medicine) Juanita Craver, MD as Consulting Physician (Gastroenterology) Sharyne Peach, MD as Consulting Physician (Ophthalmology) Donn Pierini Jenetta Downer, DMD as Consulting Physician (Dentistry) Wardell Honour, MD (Family Medicine) Alexis Frock, MD as Consulting Physician (Urology)   Other items to address today:  Discussed Eye/Dental exams Discussed immunizations Discussed documented BP risk of high readings/symptoms .    Other Screening:  Last lipid screening:07/22/2017   ADVANCE DIRECTIVES: Discussed: yes On File: yes Materials Provided: no  Immunization status:  Immunization History  Administered Date(s) Administered   Influenza Split 10/21/2010, 10/18/2011   Influenza, High Dose Seasonal PF 10/31/2017   Influenza,inj,Quad PF,6+ Mos 11/01/2012, 09/25/2013, 11/02/2015, 11/20/2016   Influenza-Unspecified 10/23/2016   MMR 02/13/2013   Pneumococcal Conjugate-13 06/22/2015   Pneumococcal Polysaccharide-23 08/21/2008   Tdap 01/30/2013   Zoster 10/21/2010   Zoster Recombinat (Shingrix) 04/09/2018     There are no preventive care reminders to display for this patient.   Functional Status Survey: Is the patient deaf or have difficulty hearing?: Yes(hearing aids in both ears) Does the patient have difficulty seeing, even when wearing glasses/contacts?: No Does the patient have difficulty concentrating, remembering, or  making decisions?: No Does the patient have difficulty walking or climbing stairs?: No Does the patient have difficulty dressing or bathing?: No Does the patient have difficulty doing errands alone such as visiting a doctor's office or shopping?: No   6CIT Screen 07/10/2018 09/26/2016  What Year? 0 points 0 points  What month? 0 points 0 points  What time? 0 points 0 points  Count back from 20 0 points 0 points  Months in reverse 0 points 2 points  Repeat phrase 0 points 0 points  Total Score 0 2        Clinical Support from 07/10/2018 in [facility]  AUDIT-C Score  4       Home Environment:    Live in two story with wife  No scattered rugs,electrical codes,pet peds No grab bars No trouble climbing stairs   Patient Active Problem List   Diagnosis Date Noted   Essential hypertension 07/03/2018   Pure hypercholesterolemia 07/03/2018   Osteoarthritis of right hip 02/07/2018   Enlarged prostate with urinary retention 12/25/2013   Erectile dysfunction 12/25/2013   Incomplete bladder emptying 08/27/2013   ADENOCARCINOMA, PROSTATE, GLEASON GRADE 6 12/21/2009   GERD 11/12/2008     Past Medical History:  Diagnosis Date   Adenocarcinoma of prostate [facility]) urologist-  dr Tresa Moore--  dx Nov 2011--  T1c, Gleason 3+3,  PSA 4.2 (post IMRT 03/2010   Low risk recurrence, Biochemical control,  last PSA 0.30 on 05-28-2014   BPH (benign prostatic hyperplasia)    Cancer ([place])    Diverticulitis    ED (erectile dysfunction) of organic origin    GERD (gastroesophageal reflux disease)    Hiatal hernia    History of colon polyps    2002 and 2003 hyperplastic   History of gastritis    Hypertension  Lower urinary tract symptoms (LUTS)    Phimosis    Sleep apnea    did not complete study.     Wears hearing aid    bilateral     Past Surgical History:  Procedure Laterality Date   ABDOMINAL HERNIA REPAIR     CIRCUMCISION N/A 06/09/2015   Procedure:  CIRCUMCISION ADULT WITH PENILE BLOCK;  Surgeon: Alexis Frock, MD;  Location: [facility];  Service: Urology;  Laterality: N/A;   COLONOSCOPY  last one 11-15-2011   ENDOVENOUS ABLATION SAPHENOUS VEIN W/ LASER Left 02/10/2016   endovenous laser ablation left greater saphenous vein and stab phlebectomy left leg by Curt Jews MD   INGUINAL HERNIA REPAIR Right 12-23-2009   left shoulder surgery     LIPOMA EXCISION N/A 12/01/2016   Procedure: EXCISION POSTERIOR NECK MASS;  Surgeon: Irene Limbo, MD;  Location: [facility];  Service: Plastics;  Laterality: N/A;   PROSTATE SURGERY  2009   PROSTATE SURGERY     SHOULDER ARTHROSCOPY WITH SUBACROMIAL DECOMPRESSION, ROTATOR CUFF REPAIR AND BICEP TENDON REPAIR Left 10-2014     Family History  Problem Relation Age of Onset   Stomach cancer Father    Cancer Father 36       stomach versus colon cancer   Pancreatic cancer Sister    Cancer Sister 3       pancreatic cancer   Cancer Brother        penile cancer     Social History   Socioeconomic History   Marital status: Married    Spouse name: Not on file   Number of children: 0   Years of education: BS   Highest education level: Not on file  Occupational History   Occupation: Development worker, community   Occupation: Advertising copywriter strain: Not on file   Food insecurity    Worry: Not on file    Inability: Not on file   Transportation needs    Medical: Not on file    Non-medical: Not on file  Tobacco Use   Smoking status: Former Smoker    Types: Cigarettes    Quit date: 09/04/1965    Years since quitting: 52.8   Smokeless tobacco: Never Used   Tobacco comment: quit around 73 yrs old.  Substance and Sexual Activity   Alcohol use: Yes    Alcohol/week: 20.0 standard drinks    Types: 20 Standard drinks or equivalent per week    Comment: nightly   Drug use: No   Sexual activity: Yes    Partners: Male    Birth  control/protection: Post-menopausal  Lifestyle   Physical activity    Days per week: Not on file    Minutes per session: Not on file   Stress: Not on file  Relationships   Social connections    Talks on phone: Not on file    Gets together: Not on file    Attends religious service: Not on file    Active member of club or organization: Not on file    Attends meetings of clubs or organizations: Not on file    Relationship status: Not on file   Intimate partner violence    Fear of current or ex partner: Not on file    Emotionally abused: Not on file    Physically abused: Not on file    Forced sexual activity: Not on file  Other Topics Concern   Not on file  Social History Narrative   **  Merged History Encounter **       Originally from [place].   Trained as an Chief Financial Officer.    Married '75- 25 years, divorced; Married '86. No children.    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 Sculptor-life size figures    by commission (see work in [place]  Sculpting work regularly.   Never smoking.   Alcohol: drinks1- 2 beers and 1-2 wines every night;    Learning the piano. Patient get regular exercise.   Wife is a Marine scientist.   Caffeine: 3/day    ADLs: drives; independent with ADLs   Advanced Directives: YES; FULL CODE; no prolonged measures.          Allergies  Allergen Reactions   Flomax [Tamsulosin Hcl]     "bad reaction" jaw pain     Prior to Admission medications   Medication Sig Start Date End Date Taking? Authorizing Provider  aspirin EC 81 MG tablet Take 81 mg daily by mouth.   Yes [provider]  atorvastatin (LIPITOR) 10 MG tablet Take 1 tablet (10 mg total) by mouth daily. 06/25/17  Yes Wardell Honour, MD  fluticasone Asencion Islam) 50 MCG/ACT nasal spray Place 2 sprays into both nostrils daily. 06/25/17  Yes Wardell Honour, MD  lisinopril (ZESTRIL) 20 MG tablet Take 1.5 tablets (30 mg  total) by mouth daily. 07/03/18 10/01/18 Yes Sagardia, Ines Bloomer, MD  Multiple Vitamin (MULTIVITAMIN) tablet Take 1 tablet by mouth daily.   Yes [provider]  Omega-3 Fatty Acids (FISH OIL OMEGA-3 PO) Take by mouth.   Yes [provider]  omeprazole (PRILOSEC) 20 MG capsule Take 1 capsule (20 mg total) by mouth daily. 06/25/17  Yes Wardell Honour, MD  ipratropium (ATROVENT) 0.03 % nasal spray Place 2 sprays into both nostrils 2 (two) times daily. Patient not taking: Reported on 02/07/2018 06/25/17   Wardell Honour, MD  OVER THE COUNTER MEDICATION OTC Ultraflora taking prn for IB    [provider]  traMADol (ULTRAM) 50 MG tablet Take 1 tablet (50 mg total) by mouth every 8 (eight) hours as needed for severe pain. Patient not taking: Reported on 07/03/2018 02/07/18   Horald Pollen, MD     Depression screen [facility] 2/9 07/10/2018 07/03/2018 02/07/2018 12/25/2017 06/25/2017  Decreased Interest 0 0 0 0 0  Down, Depressed, Hopeless 0 0 0 0 0  PHQ - 2 Score 0 0 0 0 0     Fall Risk  07/10/2018 07/03/2018 02/07/2018 12/25/2017 06/25/2017  Falls in the past year? 0 0 0 0 No  Number falls in past yr: 0 - - - -  Injury with Fall? 0 - - - -  Follow up Falls evaluation completed;Education provided;Falls prevention discussed Falls evaluation completed - - -      PHYSICAL EXAM: BP (!) 154/93 Comment: taken by patient at home   Pulse 69    Wt 185 lb (83.9 kg)    BMI 27.32 kg/m    Wt Readings from Last 3 Encounters:  07/10/18 185 lb (83.9 kg)  07/03/18 185 lb (83.9 kg)  02/07/18 188 lb 3.2 oz (85.4 kg)     No exam data present    Physical Exam   Education/Counseling provided regarding diet and exercise, prevention of chronic diseases, smoking/tobacco cessation, if applicable, and reviewed "Covered Medicare Preventive Services."

## 2018-07-16 ENCOUNTER — Encounter: Payer: Self-pay | Admitting: Emergency Medicine

## 2018-08-06 ENCOUNTER — Other Ambulatory Visit: Payer: Self-pay | Admitting: Emergency Medicine

## 2018-08-06 NOTE — Telephone Encounter (Signed)
Would Dr. Mitchel Honour like to take over this rx

## 2018-08-08 ENCOUNTER — Encounter: Payer: Self-pay | Admitting: Emergency Medicine

## 2018-08-08 ENCOUNTER — Telehealth: Payer: Self-pay | Admitting: Emergency Medicine

## 2018-08-08 ENCOUNTER — Other Ambulatory Visit: Payer: Self-pay | Admitting: *Deleted

## 2018-08-08 MED ORDER — OMEPRAZOLE 20 MG PO CPDR: 20.0000 mg | DELAYED_RELEASE_CAPSULE | Freq: Every day | ORAL | 0 refills | Status: DC

## 2018-08-08 NOTE — Telephone Encounter (Signed)
Medication Refill - Medication: omeprazole (PRILOSEC) 20 MG capsule   Preferred Pharmacy:   [facility], [place] [phone] (Phone) [phone] (Fax)     Pt was advised that RX refills may take up to 3 business days. We ask that you follow-up with your pharmacy.

## 2018-08-29 ENCOUNTER — Other Ambulatory Visit: Payer: Self-pay | Admitting: Emergency Medicine

## 2018-08-29 NOTE — Telephone Encounter (Signed)
Last filled by Dr. Tamala Julian, please advise

## 2018-08-29 NOTE — Telephone Encounter (Signed)
Copied from [place] [phone]. Topic: Quick Communication - Rx Refill/Question >> Aug 29, 2018 10:40 AM Yvette Rack wrote: Medication: fluticasone (FLONASE) 50 MCG/ACT nasal spray  Has the patient contacted their pharmacy? yes   Preferred Pharmacy (with phone number or street name): [facility] [place], [place]. [phone] (Phone) [phone] (Fax)  Agent: Please be advised that RX refills may take up to 3 business days. We ask that you follow-up with your pharmacy.

## 2018-08-30 ENCOUNTER — Encounter: Payer: Self-pay | Admitting: Emergency Medicine

## 2018-08-31 ENCOUNTER — Other Ambulatory Visit: Payer: Self-pay | Admitting: Emergency Medicine

## 2018-08-31 NOTE — Telephone Encounter (Signed)
Requested Prescriptions  Pending Prescriptions Disp Refills  . fluticasone (FLONASE) 50 MCG/ACT nasal spray [Pharmacy Med Name: FLUTICASONE PROP 50 MCG SPRAY] 48 g 0    Sig: USE 2 SPRAYS IN EACH NOSTRIL ONCE A DAY.     Ear, Nose, and Throat: Nasal Preparations - Corticosteroids Passed - 08/31/2018 12:21 PM      Passed - Valid encounter within last 12 months    Recent Outpatient Visits          1 month ago Medicare annual wellness visit, subsequent   [facility] at [facility], Lilia Argue, MD   6 months ago Right hip pain   Primary Care at [facility], Ines Bloomer, MD   8 months ago Encounter to establish care   Primary Care at [facility], [place], MD   10 months ago Need for prophylactic vaccination and inoculation against influenza   [facility] at [facility], Lilia Argue, MD   1 year ago Encounter for Medicare annual wellness exam   Primary Care at [facility], Renette Butters, MD      Future Appointments            In 2 weeks Sagardia, Ines Bloomer, MD Primary Care at [place], [facility]

## 2018-09-02 ENCOUNTER — Encounter: Payer: Self-pay | Admitting: Family Medicine

## 2018-09-03 MED ORDER — FLUTICASONE PROPIONATE 50 MCG/ACT NA SUSP: 2.0000 | Freq: Every day | NASAL | 3 refills | Status: DC

## 2018-09-03 NOTE — Telephone Encounter (Signed)
I have reached out to billing waiting on reply

## 2018-09-16 ENCOUNTER — Encounter: Payer: Self-pay | Admitting: Emergency Medicine

## 2018-09-17 ENCOUNTER — Telehealth: Payer: Self-pay | Admitting: *Deleted

## 2018-09-18 ENCOUNTER — Other Ambulatory Visit: Payer: Self-pay

## 2018-09-18 ENCOUNTER — Ambulatory Visit (INDEPENDENT_AMBULATORY_CARE_PROVIDER_SITE_OTHER): Payer: Medicare Other | Admitting: Emergency Medicine

## 2018-09-18 ENCOUNTER — Encounter: Payer: Self-pay | Admitting: *Deleted

## 2018-09-18 ENCOUNTER — Encounter: Payer: Self-pay | Admitting: Emergency Medicine

## 2018-09-18 VITALS — BP 159/74 | HR 76 | Temp 98.1°F | Resp 16 | Ht 69.0 in | Wt 186.6 lb

## 2018-09-18 DIAGNOSIS — M79671 Pain in right foot: Secondary | ICD-10-CM | POA: Diagnosis not present

## 2018-09-18 DIAGNOSIS — Z23 Encounter for immunization: Secondary | ICD-10-CM

## 2018-09-18 DIAGNOSIS — R1031 Right lower quadrant pain: Secondary | ICD-10-CM

## 2018-09-18 DIAGNOSIS — I10 Essential (primary) hypertension: Secondary | ICD-10-CM

## 2018-09-18 DIAGNOSIS — C61 Malignant neoplasm of prostate: Secondary | ICD-10-CM | POA: Diagnosis not present

## 2018-09-18 NOTE — Progress Notes (Signed)
BP Readings from Last 3 Encounters:  09/18/18 (!) 159/74  07/10/18 (!) 154/93  02/07/18 (!) 156/74   Oscar Smith 73 y.o.   Chief Complaint  Patient presents with   Hypertension    3 MONTH FOLLOW UP   Foot Problem    on the bottom left Great toe x 2-3 months    HISTORY OF PRESENT ILLNESS: This is a 73 y.o. male with history of hypertension here for follow-up.  Telemedicine visit last May.  Advised to increase lisinopril dose.  Presently taking 40 mg a day.  Blood pressure numbers at home improved. Additional complaints: 1.  Occasional pain to plantar surface of right big toe.  Mostly in the morning after starting to walk for the past 3 to 4 months.  Has a history of plantar fasciitis.  Does not happen on his left foot.  Has no history of peripheral vascular disease.  Does not happen in the afternoons.  No other associated symptoms. 2.  Has a history of repair right inguinal hernia.  Has been getting occasional pain to that area although today is better than several weeks ago.  Having normal bowel movements.  Able to eat and drink.  Denies nausea vomiting or fever.  Denies urinary symptoms. Has history of adenocarcinoma prostate.  Has regular visits with urologist and is doing well. No other complaints or medical concerns today.   HPI   Prior to Admission medications   Medication Sig Start Date End Date Taking? Authorizing Provider  aspirin EC 81 MG tablet Take 81 mg daily by mouth.   Yes [provider]  atorvastatin (LIPITOR) 10 MG tablet TAKE 1 TABLET ONCE DAILY. 08/08/18  Yes Deegan Valentino, Ines Bloomer, MD  fluticasone [facility]) 50 MCG/ACT nasal spray Place 2 sprays into both nostrils daily. 09/03/18  Yes Avyanna Spada, Ines Bloomer, MD  ipratropium (ATROVENT) 0.03 % nasal spray Place 2 sprays into both nostrils 2 (two) times daily. 06/25/17  Yes Wardell Honour, MD  lisinopril (ZESTRIL) 20 MG tablet Take 1.5 tablets (30 mg total) by mouth daily. 07/03/18 10/01/18 Yes Ciji Boston,  Ines Bloomer, MD  Multiple Vitamin (MULTIVITAMIN) tablet Take 1 tablet by mouth daily.   Yes [provider]  Omega-3 Fatty Acids (FISH OIL OMEGA-3 PO) Take by mouth.   Yes [provider]  omeprazole (PRILOSEC) 20 MG capsule Take 1 capsule (20 mg total) by mouth daily. 08/08/18  Yes Eirene Rather, Ines Bloomer, MD  traMADol (ULTRAM) 50 MG tablet Take 1 tablet (50 mg total) by mouth every 8 (eight) hours as needed for severe pain. 02/07/18  Yes [place], Ines Bloomer, MD  fluticasone [facility]) 50 MCG/ACT nasal spray USE 2 SPRAYS IN EACH NOSTRIL ONCE A DAY. 08/31/18   Horald Pollen, MD  OVER THE COUNTER MEDICATION OTC Ultraflora taking prn for IB    [provider]    Allergies  Allergen Reactions   Flomax [Tamsulosin Hcl]     "bad reaction" jaw pain    Patient Active Problem List   Diagnosis Date Noted   Essential hypertension 07/03/2018   Pure hypercholesterolemia 07/03/2018   Osteoarthritis of right hip 02/07/2018   Enlarged prostate with urinary retention 12/25/2013   Erectile dysfunction 12/25/2013   Incomplete bladder emptying 08/27/2013   ADENOCARCINOMA, PROSTATE, GLEASON GRADE 6 12/21/2009   GERD 11/12/2008    Past Medical History:  Diagnosis Date   Adenocarcinoma of prostate [facility]) urologist-  dr Tresa Moore--  dx Nov 2011--  T1c, Gleason 3+3,  PSA 4.2 (post IMRT 03/2010  Low risk recurrence, Biochemical control,  last PSA 0.30 on 05-28-2014   BPH (benign prostatic hyperplasia)    Cancer (HCC)    Diverticulitis    ED (erectile dysfunction) of organic origin    GERD (gastroesophageal reflux disease)    Hiatal hernia    History of colon polyps    2002 and 2003 hyperplastic   History of gastritis    Hypertension    Lower urinary tract symptoms (LUTS)    Phimosis    Sleep apnea    did not complete study.     Wears hearing aid    bilateral    Past Surgical History:  Procedure Laterality Date   ABDOMINAL HERNIA REPAIR      CIRCUMCISION N/A 06/09/2015   Procedure: CIRCUMCISION ADULT WITH PENILE BLOCK;  Surgeon: Alexis Frock, MD;  Location: [facility];  Service: Urology;  Laterality: N/A;   COLONOSCOPY  last one 11-15-2011   ENDOVENOUS ABLATION SAPHENOUS VEIN W/ LASER Left 02/10/2016   endovenous laser ablation left greater saphenous vein and stab phlebectomy left leg by Curt Jews MD   INGUINAL HERNIA REPAIR Right 12-23-2009   left shoulder surgery     LIPOMA EXCISION N/A 12/01/2016   Procedure: EXCISION POSTERIOR NECK MASS;  Surgeon: Irene Limbo, MD;  Location: [facility];  Service: Plastics;  Laterality: N/A;   PROSTATE SURGERY  2009   PROSTATE SURGERY     SHOULDER ARTHROSCOPY WITH SUBACROMIAL DECOMPRESSION, ROTATOR CUFF REPAIR AND BICEP TENDON REPAIR Left 10-2014    Social History   Socioeconomic History   Marital status: Married    Spouse name: Not on file   Number of children: 0   Years of education: BS   Highest education level: Not on file  Occupational History   Occupation: Development worker, community   Occupation: Advertising copywriter strain: Not on file   Food insecurity    Worry: Not on file    Inability: Not on file   Transportation needs    Medical: Not on file    Non-medical: Not on file  Tobacco Use   Smoking status: Former Smoker    Types: Cigarettes    Quit date: 09/04/1965    Years since quitting: 53.0   Smokeless tobacco: Never Used   Tobacco comment: quit around 73 yrs old.  Substance and Sexual Activity   Alcohol use: Yes    Alcohol/week: 20.0 standard drinks    Types: 20 Standard drinks or equivalent per week    Comment: nightly   Drug use: No   Sexual activity: Yes    Partners: Male    Birth control/protection: Post-menopausal  Lifestyle   Physical activity    Days per week: Not on file    Minutes per session: Not on file   Stress: Not on file  Relationships   Social connections    Talks on  phone: Not on file    Gets together: Not on file    Attends religious service: Not on file    Active member of club or organization: Not on file    Attends meetings of clubs or organizations: Not on file    Relationship status: Not on file   Intimate partner violence    Fear of current or ex partner: Not on file    Emotionally abused: Not on file    Physically abused: Not on file    Forced sexual activity: Not on file  Other Topics Concern   Not  on file  Social History Narrative   ** Merged History Encounter **       Originally from [place]   Trained as an Chief Financial Officer.    Married '75- 42 years, divorced; Married '86. No children.    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 Sculptor-life size figures    by commission (see work [place]  Sculpting work regularly.   Never smoking.   Alcohol: drinks1- 2 beers and 1-2 wines every night;    Learning the piano. Patient get regular exercise.   Wife is a Marine scientist.   Caffeine: 3/day    ADLs: drives; independent with ADLs   Advanced Directives: YES; FULL CODE; no prolonged measures.         Family History  Problem Relation Age of Onset   Stomach cancer Father    Cancer Father 75       stomach versus colon cancer   Pancreatic cancer Sister    Cancer Sister 27       pancreatic cancer   Cancer Brother        penile cancer     Review of Systems  Constitutional: Negative.  Negative for chills and fever.  HENT: Negative.  Negative for congestion and sore throat.   Respiratory: Negative.  Negative for cough and shortness of breath.   Cardiovascular: Negative.  Negative for chest pain and palpitations.  Gastrointestinal: Negative.  Negative for abdominal pain, diarrhea, nausea and vomiting.  Genitourinary: Negative for dysuria and hematuria.       Right groin pain  Musculoskeletal:       Right foot pain  Skin: Negative.  Negative for rash.    Neurological: Negative for dizziness and headaches.  All other systems reviewed and are negative.   Vitals:   09/18/18 0850  BP: (!) 159/74  Pulse: 76  Resp: 16  Temp: 98.1 F (36.7 C)  SpO2: 97%    Physical Exam Vitals signs reviewed.  Constitutional:      Appearance: Normal appearance.  HENT:     Head: Normocephalic.  Eyes:     Extraocular Movements: Extraocular movements intact.     Conjunctiva/sclera: Conjunctivae normal.     Pupils: Pupils are equal, round, and reactive to light.  Neck:     Musculoskeletal: Normal range of motion and neck supple.  Cardiovascular:     Rate and Rhythm: Normal rate and regular rhythm.     Heart sounds: Normal heart sounds.  Pulmonary:     Effort: Pulmonary effort is normal.     Breath sounds: Normal breath sounds.  Abdominal:     General: There is no distension.     Palpations: Abdomen is soft. There is no mass.     Tenderness: There is no abdominal tenderness. There is no guarding or rebound.     Hernia: There is no hernia in the left inguinal area or right inguinal area.  Genitourinary:    Penis: Normal.      Scrotum/Testes: Normal.        Right: Mass not present.        Left: Mass not present.  Musculoskeletal: Normal range of motion.     Comments: Right foot: NVI.  Good peripheral circulation.  Good pulses.  No erythema or bruising.  No swelling or tenderness.  Full range of motion.  No abnormal findings. Left foot: Within normal limits.  Lymphadenopathy:     Lower Body: No right inguinal  adenopathy. No left inguinal adenopathy.  Skin:    General: Skin is warm and dry.     Capillary Refill: Capillary refill takes less than 2 seconds.  Neurological:     General: No focal deficit present.     Mental Status: He is alert and oriented to person, place, and time.  Psychiatric:        Mood and Affect: Mood normal.        Behavior: Behavior normal.      ASSESSMENT & PLAN: Ryze was seen today for hypertension and foot  problem.  Diagnoses and all orders for this visit:  Need for prophylactic vaccination and inoculation against influenza -     Flu Vaccine QUAD High Dose(Fluad)  Right foot pain -     Ambulatory referral to Podiatry  Essential hypertension  Right groin pain  ADENOCARCINOMA, PROSTATE, GLEASON GRADE 6    Patient Instructions       If you have lab work done today you will be contacted with your lab results within the next 2 weeks.  If you have not heard from [place] then please contact us. The fastest way to get your results is to register for My Chart.   IF you received an x-ray today, you will receive an invoice from [facility]. Please contact [facility] at [phone] with questions or concerns regarding your invoice.   IF you received labwork today, you will receive an invoice from [facility]. Please contact [facility] at [phone] with questions or concerns regarding your invoice.   Our billing staff will not be able to assist you with questions regarding bills from these companies.  You will be contacted with the lab results as soon as they are available. The fastest way to get your results is to activate your My Chart account. Instructions are located on the last page of this paperwork. If you have not heard from [place] regarding the results in 2 weeks, please contact this office.     Hypertension, Adult High blood pressure (hypertension) is when the force of blood pumping through the arteries is too strong. The arteries are the blood vessels that carry blood from the heart throughout the body. Hypertension forces the heart to work harder to pump blood and may cause arteries to become narrow or stiff. Untreated or uncontrolled hypertension can cause a heart attack, heart failure, a stroke, kidney disease, and other problems. A blood pressure reading consists of a higher number over a lower number. Ideally, your blood pressure should be below 120/80. The first  ("top") number is called the systolic pressure. It is a measure of the pressure in your arteries as your heart beats. The second ("bottom") number is called the diastolic pressure. It is a measure of the pressure in your arteries as the heart relaxes. What are the causes? The exact cause of this condition is not known. There are some conditions that result in or are related to high blood pressure. What increases the risk? Some risk factors for high blood pressure are under your control. The following factors may make you more likely to develop this condition:  Smoking.  Having type 2 diabetes mellitus, high cholesterol, or both.  Not getting enough exercise or physical activity.  Being overweight.  Having too much fat, sugar, calories, or salt (sodium) in your diet.  Drinking too much alcohol. Some risk factors for high blood pressure may be difficult or impossible to change. Some of these factors include:  Having chronic kidney disease.  Having a family history of high blood pressure.  Age. Risk increases with age.  Race. You may be at higher risk if you are African American.  Gender. Men are at higher risk than women before age 20. After age 45, women are at higher risk than men.  Having obstructive sleep apnea.  Stress. What are the signs or symptoms? High blood pressure may not cause symptoms. Very high blood pressure (hypertensive crisis) may cause:  Headache.  Anxiety.  Shortness of breath.  Nosebleed.  Nausea and vomiting.  Vision changes.  Severe chest pain.  Seizures. How is this diagnosed? This condition is diagnosed by measuring your blood pressure while you are seated, with your arm resting on a flat surface, your legs uncrossed, and your feet flat on the floor. The cuff of the blood pressure monitor will be placed directly against the skin of your upper arm at the level of your heart. It should be measured at least twice using the same arm. Certain  conditions can cause a difference in blood pressure between your right and left arms. Certain factors can cause blood pressure readings to be lower or higher than normal for a short period of time:  When your blood pressure is higher when you are in a health care provider's office than when you are at home, this is called white coat hypertension. Most people with this condition do not need medicines.  When your blood pressure is higher at home than when you are in a health care provider's office, this is called masked hypertension. Most people with this condition may need medicines to control blood pressure. If you have a high blood pressure reading during one visit or you have normal blood pressure with other risk factors, you may be asked to:  Return on a different day to have your blood pressure checked again.  Monitor your blood pressure at home for 1 week or longer. If you are diagnosed with hypertension, you may have other blood or imaging tests to help your health care provider understand your overall risk for other conditions. How is this treated? This condition is treated by making healthy lifestyle changes, such as eating healthy foods, exercising more, and reducing your alcohol intake. Your health care provider may prescribe medicine if lifestyle changes are not enough to get your blood pressure under control, and if:  Your systolic blood pressure is above 130.  Your diastolic blood pressure is above 80. Your personal target blood pressure may vary depending on your medical conditions, your age, and other factors. Follow these instructions at home: Eating and drinking   Eat a diet that is high in fiber and potassium, and low in sodium, added sugar, and fat. An example eating plan is called the DASH (Dietary Approaches to Stop Hypertension) diet. To eat this way: ? Eat plenty of fresh fruits and vegetables. Try to fill one half of your plate at each meal with fruits and  vegetables. ? Eat whole grains, such as whole-wheat pasta, brown rice, or whole-grain bread. Fill about one fourth of your plate with whole grains. ? Eat or drink low-fat dairy products, such as skim milk or low-fat yogurt. ? Avoid fatty cuts of meat, processed or cured meats, and poultry with skin. Fill about one fourth of your plate with lean proteins, such as fish, chicken without skin, beans, eggs, or tofu. ? Avoid pre-made and processed foods. These tend to be higher in sodium, added sugar, and fat.  Reduce your daily sodium  intake. Most people with hypertension should eat less than 1,500 mg of sodium a day.  Do not drink alcohol if: ? Your health care provider tells you not to drink. ? You are pregnant, may be pregnant, or are planning to become pregnant.  If you drink alcohol: ? Limit how much you use to:  0-1 drink a day for women.  0-2 drinks a day for men. ? Be aware of how much alcohol is in your drink. In the [place] one drink equals one 12 oz bottle of beer (355 mL), one 5 oz glass of wine (148 mL), or one 1 oz glass of hard liquor (44 mL). Lifestyle   Work with your health care provider to maintain a healthy body weight or to lose weight. Ask what an ideal weight is for you.  Get at least 30 minutes of exercise most days of the week. Activities may include walking, swimming, or biking.  Include exercise to strengthen your muscles (resistance exercise), such as Pilates or lifting weights, as part of your weekly exercise routine. Try to do these types of exercises for 30 minutes at least 3 days a week.  Do not use any products that contain nicotine or tobacco, such as cigarettes, e-cigarettes, and chewing tobacco. If you need help quitting, ask your health care provider.  Monitor your blood pressure at home as told by your health care provider.  Keep all follow-up visits as told by your health care provider. This is important. Medicines  Take over-the-counter and  prescription medicines only as told by your health care provider. Follow directions carefully. Blood pressure medicines must be taken as prescribed.  Do not skip doses of blood pressure medicine. Doing this puts you at risk for problems and can make the medicine less effective.  Ask your health care provider about side effects or reactions to medicines that you should watch for. Contact a health care provider if you:  Think you are having a reaction to a medicine you are taking.  Have headaches that keep coming back (recurring).  Feel dizzy.  Have swelling in your ankles.  Have trouble with your vision. Get help right away if you:  Develop a severe headache or confusion.  Have unusual weakness or numbness.  Feel faint.  Have severe pain in your chest or abdomen.  Vomit repeatedly.  Have trouble breathing. Summary  Hypertension is when the force of blood pumping through your arteries is too strong. If this condition is not controlled, it may put you at risk for serious complications.  Your personal target blood pressure may vary depending on your medical conditions, your age, and other factors. For most people, a normal blood pressure is less than 120/80.  Hypertension is treated with lifestyle changes, medicines, or a combination of both. Lifestyle changes include losing weight, eating a healthy, low-sodium diet, exercising more, and limiting alcohol. This information is not intended to replace advice given to you by your health care provider. Make sure you discuss any questions you have with your health care provider. Document Released: 01/09/2005 Document Revised: 09/19/2017 Document Reviewed: 09/19/2017 Elsevier Patient Education  [place] Maintenance After Age 37 After age 59, you are at a higher risk for certain long-term diseases and infections as well as injuries from falls. Falls are a major cause of broken bones and head injuries in people who are  older than age 28. Getting regular preventive care can help to keep you healthy and well. Preventive care includes  getting regular testing and making lifestyle changes as recommended by your health care provider. Talk with your health care provider about:  Which screenings and tests you should have. A screening is a test that checks for a disease when you have no symptoms.  A diet and exercise plan that is right for you. What should I know about screenings and tests to prevent falls? Screening and testing are the best ways to find a health problem early. Early diagnosis and treatment give you the best chance of managing medical conditions that are common after age 58. Certain conditions and lifestyle choices may make you more likely to have a fall. Your health care provider may recommend:  Regular vision checks. Poor vision and conditions such as cataracts can make you more likely to have a fall. If you wear glasses, make sure to get your prescription updated if your vision changes.  Medicine review. Work with your health care provider to regularly review all of the medicines you are taking, including over-the-counter medicines. Ask your health care provider about any side effects that may make you more likely to have a fall. Tell your health care provider if any medicines that you take make you feel dizzy or sleepy.  Osteoporosis screening. Osteoporosis is a condition that causes the bones to get weaker. This can make the bones weak and cause them to break more easily.  Blood pressure screening. Blood pressure changes and medicines to control blood pressure can make you feel dizzy.  Strength and balance checks. Your health care provider may recommend certain tests to check your strength and balance while standing, walking, or changing positions.  Foot health exam. Foot pain and numbness, as well as not wearing proper footwear, can make you more likely to have a fall.  Depression screening. You  may be more likely to have a fall if you have a fear of falling, feel emotionally low, or feel unable to do activities that you used to do.  Alcohol use screening. Using too much alcohol can affect your balance and may make you more likely to have a fall. What actions can I take to lower my risk of falls? General instructions  Talk with your health care provider about your risks for falling. Tell your health care provider if: ? You fall. Be sure to tell your health care provider about all falls, even ones that seem minor. ? You feel dizzy, sleepy, or off-balance.  Take over-the-counter and prescription medicines only as told by your health care provider. These include any supplements.  Eat a healthy diet and maintain a healthy weight. A healthy diet includes low-fat dairy products, low-fat (lean) meats, and fiber from whole grains, beans, and lots of fruits and vegetables. Home safety  Remove any tripping hazards, such as rugs, cords, and clutter.  Install safety equipment such as grab bars in bathrooms and safety rails on stairs.  Keep rooms and walkways well-lit. Activity   Follow a regular exercise program to stay fit. This will help you maintain your balance. Ask your health care provider what types of exercise are appropriate for you.  If you need a cane or walker, use it as recommended by your health care provider.  Wear supportive shoes that have nonskid soles. Lifestyle  Do not drink alcohol if your health care provider tells you not to drink.  If you drink alcohol, limit how much you have: ? 0-1 drink a day for women. ? 0-2 drinks a day for men.  Be  aware of how much alcohol is in your drink. In the [place] one drink equals one typical bottle of beer (12 oz), one-half glass of wine (5 oz), or one shot of hard liquor (1 oz).  Do not use any products that contain nicotine or tobacco, such as cigarettes and e-cigarettes. If you need help quitting, ask your health care  provider. Summary  Having a healthy lifestyle and getting preventive care can help to protect your health and wellness after age 49.  Screening and testing are the best way to find a health problem early and help you avoid having a fall. Early diagnosis and treatment give you the best chance for managing medical conditions that are more common for people who are older than age 2.  Falls are a major cause of broken bones and head injuries in people who are older than age 27. Take precautions to prevent a fall at home.  Work with your health care provider to learn what changes you can make to improve your health and wellness and to prevent falls. This information is not intended to replace advice given to you by your health care provider. Make sure you discuss any questions you have with your health care provider. Document Released: 11/22/2016 Document Revised: 05/02/2018 Document Reviewed: 11/22/2016 Elsevier Patient Education  2020 Elsevier Inc.      Agustina Caroli, MD [facility]

## 2018-09-18 NOTE — Telephone Encounter (Signed)
Noted patient notified by  [facility] bill has been taken care of for AWV

## 2018-09-18 NOTE — Patient Instructions (Addendum)
   If you have lab work done today you will be contacted with your lab results within the next 2 weeks.  If you have not heard from us then please contact us. The fastest way to get your results is to register for My Chart.   IF you received an x-ray today, you will receive an invoice from [facility]. Please contact [facility] at [phone] with questions or concerns regarding your invoice.   IF you received labwork today, you will receive an invoice from [facility]. Please contact [facility] at [phone] with questions or concerns regarding your invoice.   Our billing staff will not be able to assist you with questions regarding bills from these companies.  You will be contacted with the lab results as soon as they are available. The fastest way to get your results is to activate your My Chart account. Instructions are located on the last page of this paperwork. If you have not heard from us regarding the results in 2 weeks, please contact this office.     Hypertension, Adult High blood pressure (hypertension) is when the force of blood pumping through the arteries is too strong. The arteries are the blood vessels that carry blood from the heart throughout the body. Hypertension forces the heart to work harder to pump blood and may cause arteries to become narrow or stiff. Untreated or uncontrolled hypertension can cause a heart attack, heart failure, a stroke, kidney disease, and other problems. A blood pressure reading consists of a higher number over a lower number. Ideally, your blood pressure should be below 120/80. The first ("top") number is called the systolic pressure. It is a measure of the pressure in your arteries as your heart beats. The second ("bottom") number is called the diastolic pressure. It is a measure of the pressure in your arteries as the heart relaxes. What are the causes? The exact cause of this condition is not known. There are some conditions  that result in or are related to high blood pressure. What increases the risk? Some risk factors for high blood pressure are under your control. The following factors may make you more likely to develop this condition:  Smoking.  Having type 2 diabetes mellitus, high cholesterol, or both.  Not getting enough exercise or physical activity.  Being overweight.  Having too much fat, sugar, calories, or salt (sodium) in your diet.  Drinking too much alcohol. Some risk factors for high blood pressure may be difficult or impossible to change. Some of these factors include:  Having chronic kidney disease.  Having a family history of high blood pressure.  Age. Risk increases with age.  Race. You may be at higher risk if you are African American.  Gender. Men are at higher risk than women before age 45. After age 65, women are at higher risk than men.  Having obstructive sleep apnea.  Stress. What are the signs or symptoms? High blood pressure may not cause symptoms. Very high blood pressure (hypertensive crisis) may cause:  Headache.  Anxiety.  Shortness of breath.  Nosebleed.  Nausea and vomiting.  Vision changes.  Severe chest pain.  Seizures. How is this diagnosed? This condition is diagnosed by measuring your blood pressure while you are seated, with your arm resting on a flat surface, your legs uncrossed, and your feet flat on the floor. The cuff of the blood pressure monitor will be placed directly against the skin of your upper arm at the level of your heart.   It should be measured at least twice using the same arm. Certain conditions can cause a difference in blood pressure between your right and left arms. Certain factors can cause blood pressure readings to be lower or higher than normal for a short period of time:  When your blood pressure is higher when you are in a health care provider's office than when you are at home, this is called white coat hypertension.  Most people with this condition do not need medicines.  When your blood pressure is higher at home than when you are in a health care provider's office, this is called masked hypertension. Most people with this condition may need medicines to control blood pressure. If you have a high blood pressure reading during one visit or you have normal blood pressure with other risk factors, you may be asked to:  Return on a different day to have your blood pressure checked again.  Monitor your blood pressure at home for 1 week or longer. If you are diagnosed with hypertension, you may have other blood or imaging tests to help your health care provider understand your overall risk for other conditions. How is this treated? This condition is treated by making healthy lifestyle changes, such as eating healthy foods, exercising more, and reducing your alcohol intake. Your health care provider may prescribe medicine if lifestyle changes are not enough to get your blood pressure under control, and if:  Your systolic blood pressure is above 130.  Your diastolic blood pressure is above 80. Your personal target blood pressure may vary depending on your medical conditions, your age, and other factors. Follow these instructions at home: Eating and drinking   Eat a diet that is high in fiber and potassium, and low in sodium, added sugar, and fat. An example eating plan is called the DASH (Dietary Approaches to Stop Hypertension) diet. To eat this way: ? Eat plenty of fresh fruits and vegetables. Try to fill one half of your plate at each meal with fruits and vegetables. ? Eat whole grains, such as whole-wheat pasta, brown rice, or whole-grain bread. Fill about one fourth of your plate with whole grains. ? Eat or drink low-fat dairy products, such as skim milk or low-fat yogurt. ? Avoid fatty cuts of meat, processed or cured meats, and poultry with skin. Fill about one fourth of your plate with lean proteins, such  as fish, chicken without skin, beans, eggs, or tofu. ? Avoid pre-made and processed foods. These tend to be higher in sodium, added sugar, and fat.  Reduce your daily sodium intake. Most people with hypertension should eat less than 1,500 mg of sodium a day.  Do not drink alcohol if: ? Your health care provider tells you not to drink. ? You are pregnant, may be pregnant, or are planning to become pregnant.  If you drink alcohol: ? Limit how much you use to:  0-1 drink a day for women.  0-2 drinks a day for men. ? Be aware of how much alcohol is in your drink. In the [place] one drink equals one 12 oz bottle of beer (355 mL), one 5 oz glass of wine (148 mL), or one 1 oz glass of hard liquor (44 mL). Lifestyle   Work with your health care provider to maintain a healthy body weight or to lose weight. Ask what an ideal weight is for you.  Get at least 30 minutes of exercise most days of the week. Activities may include walking, swimming, or   biking.  Include exercise to strengthen your muscles (resistance exercise), such as Pilates or lifting weights, as part of your weekly exercise routine. Try to do these types of exercises for 30 minutes at least 3 days a week.  Do not use any products that contain nicotine or tobacco, such as cigarettes, e-cigarettes, and chewing tobacco. If you need help quitting, ask your health care provider.  Monitor your blood pressure at home as told by your health care provider.  Keep all follow-up visits as told by your health care provider. This is important. Medicines  Take over-the-counter and prescription medicines only as told by your health care provider. Follow directions carefully. Blood pressure medicines must be taken as prescribed.  Do not skip doses of blood pressure medicine. Doing this puts you at risk for problems and can make the medicine less effective.  Ask your health care provider about side effects or reactions to medicines that you  should watch for. Contact a health care provider if you:  Think you are having a reaction to a medicine you are taking.  Have headaches that keep coming back (recurring).  Feel dizzy.  Have swelling in your ankles.  Have trouble with your vision. Get help right away if you:  Develop a severe headache or confusion.  Have unusual weakness or numbness.  Feel faint.  Have severe pain in your chest or abdomen.  Vomit repeatedly.  Have trouble breathing. Summary  Hypertension is when the force of blood pumping through your arteries is too strong. If this condition is not controlled, it may put you at risk for serious complications.  Your personal target blood pressure may vary depending on your medical conditions, your age, and other factors. For most people, a normal blood pressure is less than 120/80.  Hypertension is treated with lifestyle changes, medicines, or a combination of both. Lifestyle changes include losing weight, eating a healthy, low-sodium diet, exercising more, and limiting alcohol. This information is not intended to replace advice given to you by your health care provider. Make sure you discuss any questions you have with your health care provider. Document Released: 01/09/2005 Document Revised: 09/19/2017 Document Reviewed: 09/19/2017 Elsevier Patient Education  [place] Maintenance After Age 55 After age 65, you are at a higher risk for certain long-term diseases and infections as well as injuries from falls. Falls are a major cause of broken bones and head injuries in people who are older than age 24. Getting regular preventive care can help to keep you healthy and well. Preventive care includes getting regular testing and making lifestyle changes as recommended by your health care provider. Talk with your health care provider about:  Which screenings and tests you should have. A screening is a test that checks for a disease when you have no  symptoms.  A diet and exercise plan that is right for you. What should I know about screenings and tests to prevent falls? Screening and testing are the best ways to find a health problem early. Early diagnosis and treatment give you the best chance of managing medical conditions that are common after age 22. Certain conditions and lifestyle choices may make you more likely to have a fall. Your health care provider may recommend:  Regular vision checks. Poor vision and conditions such as cataracts can make you more likely to have a fall. If you wear glasses, make sure to get your prescription updated if your vision changes.  Medicine review. Work with your  health care provider to regularly review all of the medicines you are taking, including over-the-counter medicines. Ask your health care provider about any side effects that may make you more likely to have a fall. Tell your health care provider if any medicines that you take make you feel dizzy or sleepy.  Osteoporosis screening. Osteoporosis is a condition that causes the bones to get weaker. This can make the bones weak and cause them to break more easily.  Blood pressure screening. Blood pressure changes and medicines to control blood pressure can make you feel dizzy.  Strength and balance checks. Your health care provider may recommend certain tests to check your strength and balance while standing, walking, or changing positions.  Foot health exam. Foot pain and numbness, as well as not wearing proper footwear, can make you more likely to have a fall.  Depression screening. You may be more likely to have a fall if you have a fear of falling, feel emotionally low, or feel unable to do activities that you used to do.  Alcohol use screening. Using too much alcohol can affect your balance and may make you more likely to have a fall. What actions can I take to lower my risk of falls? General instructions  Talk with your health care  provider about your risks for falling. Tell your health care provider if: ? You fall. Be sure to tell your health care provider about all falls, even ones that seem minor. ? You feel dizzy, sleepy, or off-balance.  Take over-the-counter and prescription medicines only as told by your health care provider. These include any supplements.  Eat a healthy diet and maintain a healthy weight. A healthy diet includes low-fat dairy products, low-fat (lean) meats, and fiber from whole grains, beans, and lots of fruits and vegetables. Home safety  Remove any tripping hazards, such as rugs, cords, and clutter.  Install safety equipment such as grab bars in bathrooms and safety rails on stairs.  Keep rooms and walkways well-lit. Activity   Follow a regular exercise program to stay fit. This will help you maintain your balance. Ask your health care provider what types of exercise are appropriate for you.  If you need a cane or walker, use it as recommended by your health care provider.  Wear supportive shoes that have nonskid soles. Lifestyle  Do not drink alcohol if your health care provider tells you not to drink.  If you drink alcohol, limit how much you have: ? 0-1 drink a day for women. ? 0-2 drinks a day for men.  Be aware of how much alcohol is in your drink. In the [place] one drink equals one typical bottle of beer (12 oz), one-half glass of wine (5 oz), or one shot of hard liquor (1 oz).  Do not use any products that contain nicotine or tobacco, such as cigarettes and e-cigarettes. If you need help quitting, ask your health care provider. Summary  Having a healthy lifestyle and getting preventive care can help to protect your health and wellness after age 31.  Screening and testing are the best way to find a health problem early and help you avoid having a fall. Early diagnosis and treatment give you the best chance for managing medical conditions that are more common for people who  are older than age 21.  Falls are a major cause of broken bones and head injuries in people who are older than age 63. Take precautions to prevent a fall at home.  Work with your health care provider to learn what changes you can make to improve your health and wellness and to prevent falls. This information is not intended to replace advice given to you by your health care provider. Make sure you discuss any questions you have with your health care provider. Document Released: 11/22/2016 Document Revised: 05/02/2018 Document Reviewed: 11/22/2016 Elsevier Patient Education  2020 [employer].

## 2018-09-26 ENCOUNTER — Other Ambulatory Visit: Payer: Self-pay

## 2018-09-26 ENCOUNTER — Encounter: Payer: Self-pay | Admitting: Podiatry

## 2018-09-26 ENCOUNTER — Ambulatory Visit (INDEPENDENT_AMBULATORY_CARE_PROVIDER_SITE_OTHER): Payer: Medicare Other | Admitting: Podiatry

## 2018-09-26 ENCOUNTER — Other Ambulatory Visit: Payer: Self-pay | Admitting: Podiatry

## 2018-09-26 ENCOUNTER — Ambulatory Visit (INDEPENDENT_AMBULATORY_CARE_PROVIDER_SITE_OTHER): Payer: Medicare Other

## 2018-09-26 DIAGNOSIS — M205X1 Other deformities of toe(s) (acquired), right foot: Secondary | ICD-10-CM

## 2018-09-26 DIAGNOSIS — M779 Enthesopathy, unspecified: Secondary | ICD-10-CM

## 2018-09-26 DIAGNOSIS — M7751 Other enthesopathy of right foot: Secondary | ICD-10-CM

## 2018-09-26 NOTE — Progress Notes (Signed)
Subjective:   Patient ID: Oscar Smith, male   DOB: 73 y.o.   MRN: [id number]   HPI Patient presents stating a lot of pain in the big toe joint right and states he does not remember specific injury.  Patient's not been seen for fairly long time and states that this has been gradually become more of an issue for him as he has progressed.  Patient got new shoes which seems to be helping a little bit but still having soreness.  Patient does not smoke   Review of Systems  All other systems reviewed and are negative.       Objective:  Physical Exam Vitals signs and nursing note reviewed.  Constitutional:      Appearance: He is well-developed.  Pulmonary:     Effort: Pulmonary effort is normal.  Musculoskeletal: Normal range of motion.  Skin:    General: Skin is warm.  Neurological:     Mental Status: He is alert.     Neurovascular status intact negative Homans sign noted with patient found to have inflammation pain and reduced motion of the first MPJ right with what appears to be dorsal spur formation.  It is localized and patient does have good digital perfusion well oriented x3     Assessment:  Inflammatory capsulitis first MPJ right with hallux limitus deformity also present with pain and fluid buildup     Plan:  H&P x-ray taken and spur discussed and today I did sterile prep and injected the lateral and dorsal part of the capsule of the first MPJ 3 mg Dexasone Kenalog 5 mg Xylocaine advised on more rigid bottom shoes and reappoint on an as-needed basis  X-rays indicate there is narrowing of the joint surface with spur formation right foot

## 2018-10-06 ENCOUNTER — Encounter: Payer: Self-pay | Admitting: Emergency Medicine

## 2018-10-28 ENCOUNTER — Other Ambulatory Visit: Payer: Self-pay | Admitting: Emergency Medicine

## 2018-11-04 ENCOUNTER — Other Ambulatory Visit: Payer: Self-pay | Admitting: Emergency Medicine

## 2018-11-10 ENCOUNTER — Encounter: Payer: Self-pay | Admitting: Emergency Medicine

## 2018-11-11 ENCOUNTER — Other Ambulatory Visit: Payer: Self-pay | Admitting: Emergency Medicine

## 2018-11-11 MED ORDER — LISINOPRIL 40 MG PO TABS: 40.0000 mg | ORAL_TABLET | Freq: Every day | ORAL | 3 refills | Status: DC

## 2018-11-25 ENCOUNTER — Ambulatory Visit (INDEPENDENT_AMBULATORY_CARE_PROVIDER_SITE_OTHER): Payer: Medicare Other | Admitting: Emergency Medicine

## 2018-11-25 ENCOUNTER — Other Ambulatory Visit: Payer: Self-pay

## 2018-11-25 ENCOUNTER — Encounter: Payer: Self-pay | Admitting: Emergency Medicine

## 2018-11-25 VITALS — BP 143/76 | HR 73 | Temp 98.3°F | Resp 16 | Ht 70.0 in | Wt 186.4 lb

## 2018-11-25 DIAGNOSIS — M25551 Pain in right hip: Secondary | ICD-10-CM

## 2018-11-25 DIAGNOSIS — M159 Polyosteoarthritis, unspecified: Secondary | ICD-10-CM

## 2018-11-25 MED ORDER — METHYLPREDNISOLONE ACETATE 80 MG/ML IJ SUSP
80.0000 mg | Freq: Once | INTRAMUSCULAR | Status: AC
  Administered 2018-11-25: 80 mg via INTRAMUSCULAR

## 2018-11-25 MED ORDER — TRAMADOL HCL 50 MG PO TABS: 50.0000 mg | ORAL_TABLET | Freq: Three times a day (TID) | ORAL | 0 refills | Status: AC | PRN

## 2018-11-25 NOTE — Patient Instructions (Addendum)
If you have lab work done today you will be contacted with your lab results within the next 2 weeks.  If you have not heard from [place] then please contact us. The fastest way to get your results is to register for My Chart.   IF you received an x-ray today, you will receive an invoice from [facility]. Please contact [facility] at [phone] with questions or concerns regarding your invoice.   IF you received labwork today, you will receive an invoice from [facility]. Please contact [facility] at [phone] with questions or concerns regarding your invoice.   Our billing staff will not be able to assist you with questions regarding bills from these companies.  You will be contacted with the lab results as soon as they are available. The fastest way to get your results is to activate your My Chart account. Instructions are located on the last page of this paperwork. If you have not heard from [place] regarding the results in 2 weeks, please contact this office.     Hip Pain  The hip is the joint between the upper legs and the lower pelvis. The bones, cartilage, tendons, and muscles of your hip joint support your body and allow you to move around. Hip pain can range from a minor ache to severe pain in one or both of your hips. The pain may be felt on the inside of the hip joint near the groin, or the outside near the buttocks and upper thigh. You may also have swelling or stiffness. Follow these instructions at home: Managing pain, stiffness, and swelling  If directed, apply ice to the injured area. ? Put ice in a plastic bag. ? Place a towel between your skin and the bag. ? Leave the ice on for 20 minutes, 2-3 times a day  Sleep with a pillow between your legs on your most comfortable side.  Avoid any activities that cause pain. General instructions  Take over-the-counter and prescription medicines only as told by your health care provider.  Do any exercises as told  by your health care provider.  Record the following: ? How often you have hip pain. ? The location of your pain. ? What the pain feels like. ? What makes the pain worse.  Keep all follow-up visits as told by your health care provider. This is important. Contact a health care provider if:  You cannot put weight on your leg.  Your pain or swelling continues or gets worse after one week.  It gets harder to walk.  You have a fever. Get help right away if:  You fall.  You have a sudden increase in pain and swelling in your hip.  Your hip is red or swollen or very tender to touch. Summary  Hip pain can range from a minor ache to severe pain in one or both of your hips.  The pain may be felt on the inside of the hip joint near the groin, or the outside near the buttocks and upper thigh.  Avoid any activities that cause pain.  Record how often you have hip pain, the location of the pain, what makes it worse and what it feels like. This information is not intended to replace advice given to you by your health care provider. Make sure you discuss any questions you have with your health care provider. Document Released: 06/29/2009 Document Revised: 12/22/2016 Document Reviewed: 12/13/2015 Elsevier Patient Education  2020 [place].  Osteoarthritis  Osteoarthritis is  a type of arthritis that affects tissue that covers the ends of bones in joints (cartilage). Cartilage acts as a cushion between the bones and helps them move smoothly. Osteoarthritis results when cartilage in the joints gets worn down. Osteoarthritis is sometimes called "wear and tear" arthritis. Osteoarthritis is the most common form of arthritis. It often occurs in older people. It is a condition that gets worse over time (a progressive condition). Joints that are most often affected by this condition are in:  Fingers.  Toes.  Hips.  Knees.  Spine, including neck and lower back. What are the causes? This  condition is caused by age-related wearing down of cartilage that covers the ends of bones. What increases the risk? The following factors may make you more likely to develop this condition:  Older age.  Being overweight or obese.  Overuse of joints, such as in athletes.  Past injury of a joint.  Past surgery on a joint.  Family history of osteoarthritis. What are the signs or symptoms? The main symptoms of this condition are pain, swelling, and stiffness in the joint. The joint may lose its shape over time. Small pieces of bone or cartilage may break off and float inside of the joint, which may cause more pain and damage to the joint. Small deposits of bone (osteophytes) may grow on the edges of the joint. Other symptoms may include:  A grating or scraping feeling inside the joint when you move it.  Popping or creaking sounds when you move. Symptoms may affect one or more joints. Osteoarthritis in a major joint, such as your knee or hip, can make it painful to walk or exercise. If you have osteoarthritis in your hands, you might not be able to grip items, twist your hand, or control small movements of your hands and fingers (fine motor skills). How is this diagnosed? This condition may be diagnosed based on:  Your medical history.  A physical exam.  Your symptoms.  X-rays of the affected joint(s).  Blood tests to rule out other types of arthritis. How is this treated? There is no cure for this condition, but treatment can help to control pain and improve joint function. Treatment plans may include:  A prescribed exercise program that allows for rest and joint relief. You may work with a physical therapist.  A weight control plan.  Pain relief techniques, such as: ? Applying heat and cold to the joint. ? Electric pulses delivered to nerve endings under the skin (transcutaneous electrical nerve stimulation, or TENS). ? Massage. ? Certain nutritional supplements.  NSAIDs  or prescription medicines to help relieve pain.  Medicine to help relieve pain and inflammation (corticosteroids). This can be given by mouth (orally) or as an injection.  Assistive devices, such as a brace, wrap, splint, specialized glove, or cane.  Surgery, such as: ? An osteotomy. This is done to reposition the bones and relieve pain or to remove loose pieces of bone and cartilage. ? Joint replacement surgery. You may need this surgery if you have very bad (advanced) osteoarthritis. Follow these instructions at home: Activity  Rest your affected joints as directed by your health care provider.  Do not drive or use heavy machinery while taking prescription pain medicine.  Exercise as directed. Your health care provider or physical therapist may recommend specific types of exercise, such as: ? Strengthening exercises. These are done to strengthen the muscles that support joints that are affected by arthritis. They can be performed with weights or  with exercise bands to add resistance. ? Aerobic activities. These are exercises, such as brisk walking or water aerobics, that get your heart pumping. ? Range-of-motion activities. These keep your joints easy to move. ? Balance and agility exercises. Managing pain, stiffness, and swelling      If directed, apply heat to the affected area as often as told by your health care provider. Use the heat source that your health care provider recommends, such as a moist heat pack or a heating pad. ? If you have a removable assistive device, remove it as told by your health care provider. ? Place a towel between your skin and the heat source. If your health care provider tells you to keep the assistive device on while you apply heat, place a towel between the assistive device and the heat source. ? Leave the heat on for 20-30 minutes. ? Remove the heat if your skin turns bright red. This is especially important if you are unable to feel pain, heat, or  cold. You may have a greater risk of getting burned.  If directed, put ice on the affected joint: ? If you have a removable assistive device, remove it as told by your health care provider. ? Put ice in a plastic bag. ? Place a towel between your skin and the bag. If your health care provider tells you to keep the assistive device on during icing, place a towel between the assistive device and the bag. ? Leave the ice on for 20 minutes, 2-3 times a day. General instructions  Take over-the-counter and prescription medicines only as told by your health care provider.  Maintain a healthy weight. Follow instructions from your health care provider for weight control. These may include dietary restrictions.  Do not use any products that contain nicotine or tobacco, such as cigarettes and e-cigarettes. These can delay bone healing. If you need help quitting, ask your health care provider.  Use assistive devices as directed by your health care provider.  Keep all follow-up visits as told by your health care provider. This is important. Where to find more information  [employer] of Arthritis and Musculoskeletal and Skin Diseases: www.niams.South[employer].es  [employer] on Aging: http://kim-miller.com/  American College of Rheumatology: www.rheumatology.org Contact a health care provider if:  Your skin turns red.  You develop a rash.  You have pain that gets worse.  You have a fever along with joint or muscle aches. Get help right away if:  You lose a lot of weight.  You suddenly lose your appetite.  You have night sweats. Summary  Osteoarthritis is a type of arthritis that affects tissue covering the ends of bones in joints (cartilage).  This condition is caused by age-related wearing down of cartilage that covers the ends of bones.  The main symptom of this condition is pain, swelling, and stiffness in the joint.  There is no cure for this condition, but treatment can help to  control pain and improve joint function. This information is not intended to replace advice given to you by your health care provider. Make sure you discuss any questions you have with your health care provider. Document Released: 01/09/2005 Document Revised: 12/22/2016 Document Reviewed: 09/13/2015 Elsevier Patient Education  2020 [employer].

## 2018-11-25 NOTE — Progress Notes (Signed)
Oscar Smith 73 y.o.   Chief Complaint  Patient presents with  . Back Pain    lower right area x 5 days    HISTORY OF PRESENT ILLNESS: This is a 73 y.o. male with history of osteoarthritis of multiple joints complaining of increased pain to right hip area radiating into his right lumbar area for the past 5 days.  Denies injury.  No pain radiation to the leg.  Denies bowel or bladder issues.  Denies neurological symptoms to either leg.  Denies abdominal pain or flank pain.  No fever, chills, or flulike symptoms.  States he had a similar episode 1 or 2 years ago and shot of steroid helped a lot.  HPI   Prior to Admission medications   Medication Sig Start Date End Date Taking? Authorizing Provider  aspirin EC 81 MG tablet Take 81 mg daily by mouth.   Yes [provider]  atorvastatin (LIPITOR) 10 MG tablet TAKE 1 TABLET ONCE DAILY. 10/28/18  Yes Edris Friedt, Ines Bloomer, MD  fluticasone [facility]) 50 MCG/ACT nasal spray Place 2 sprays into both nostrils daily. 09/03/18  Yes Jovanni Rash, Ines Bloomer, MD  fluticasone [facility]) 50 MCG/ACT nasal spray USE 2 SPRAYS IN EACH NOSTRIL ONCE A DAY. 08/31/18  Yes Isiaih Hollenbach, Ines Bloomer, MD  ipratropium (ATROVENT) 0.03 % nasal spray Place 2 sprays into both nostrils 2 (two) times daily. 06/25/17  Yes Wardell Honour, MD  lisinopril (ZESTRIL) 40 MG tablet Take 1 tablet (40 mg total) by mouth daily. 11/11/18  Yes Ermina Oberman, Ines Bloomer, MD  Multiple Vitamin (MULTIVITAMIN) tablet Take 1 tablet by mouth daily.   Yes [provider]  Omega-3 Fatty Acids (FISH OIL OMEGA-3 PO) Take by mouth.   Yes [provider]  omeprazole (PRILOSEC) 20 MG capsule TAKE 1 CAPSULE 15-20 MINUTES BEFORE BREAKFAST. 11/04/18  Yes [place], Ines Bloomer, MD  OVER THE COUNTER MEDICATION OTC Ultraflora taking prn for IB    [provider]  traMADol (ULTRAM) 50 MG tablet Take 1 tablet (50 mg total) by mouth every 8 (eight) hours as needed for severe pain.  Patient not taking: Reported on 11/25/2018 02/07/18   Horald Pollen, MD    Allergies  Allergen Reactions  . Flomax [Tamsulosin Hcl]     "bad reaction" jaw pain    Patient Active Problem List   Diagnosis Date Noted  . Essential hypertension 07/03/2018  . Pure hypercholesterolemia 07/03/2018  . Osteoarthritis of right hip 02/07/2018  . Enlarged prostate with urinary retention 12/25/2013  . Erectile dysfunction 12/25/2013  . Incomplete bladder emptying 08/27/2013  . ADENOCARCINOMA, PROSTATE, GLEASON GRADE 6 12/21/2009  . GERD 11/12/2008    Past Medical History:  Diagnosis Date  . Adenocarcinoma of prostate [facility]) urologist-  dr Tresa Moore--  dx Nov 2011--  T1c, Gleason 3+3,  PSA 4.2 (post IMRT 03/2010   Low risk recurrence, Biochemical control,  last PSA 0.30 on 05-28-2014  . BPH (benign prostatic hyperplasia)   . Cancer ([place])   . Diverticulitis   . ED (erectile dysfunction) of organic origin   . GERD (gastroesophageal reflux disease)   . Hiatal hernia   . History of colon polyps    2002 and 2003 hyperplastic  . History of gastritis   . Hypertension   . Lower urinary tract symptoms (LUTS)   . Phimosis   . Sleep apnea    did not complete study.    . Wears hearing aid    bilateral    Past Surgical History:  Procedure Laterality Date  . ABDOMINAL HERNIA REPAIR    . CIRCUMCISION N/A 06/09/2015   Procedure: CIRCUMCISION ADULT WITH PENILE BLOCK;  Surgeon: Alexis Frock, MD;  [facility];  Service: Urology;  Laterality: N/A;  . COLONOSCOPY  last one 11-15-2011  . ENDOVENOUS ABLATION SAPHENOUS VEIN W/ LASER Left 02/10/2016   endovenous laser ablation left greater saphenous vein and stab phlebectomy left leg by Curt Jews MD  . INGUINAL HERNIA REPAIR Right 12-23-2009  . left shoulder surgery    . LIPOMA EXCISION N/A 12/01/2016   Procedure: EXCISION POSTERIOR NECK MASS;  Surgeon: Irene Limbo, MD;  Location: [place];  Service:  Plastics;  Laterality: N/A;  . PROSTATE SURGERY  2009  . PROSTATE SURGERY    . SHOULDER ARTHROSCOPY WITH SUBACROMIAL DECOMPRESSION, ROTATOR CUFF REPAIR AND BICEP TENDON REPAIR Left 10-2014    Social History   Socioeconomic History  . Marital status: Married    Spouse name: Not on file  . Number of children: 0  . Years of education: BS  . Highest education level: Not on file  Occupational History  . Occupation: Development worker, community  . Occupation: Chief Financial Officer  Social Needs  . Financial resource strain: Not on file  . Food insecurity    Worry: Not on file    Inability: Not on file  . Transportation needs    Medical: Not on file    Non-medical: Not on file  Tobacco Use  . Smoking status: Former Smoker    Types: Cigarettes    Quit date: 09/04/1965    Years since quitting: 53.2  . Smokeless tobacco: Never Used  . Tobacco comment: quit around 73 yrs old.  Substance and Sexual Activity  . Alcohol use: Yes    Alcohol/week: 20.0 standard drinks    Types: 20 Standard drinks or equivalent per week    Comment: nightly  . Drug use: No  . Sexual activity: Yes    Partners: Male    Birth control/protection: Post-menopausal  Lifestyle  . Physical activity    Days per week: Not on file    Minutes per session: Not on file  . Stress: Not on file  Relationships  . Social Herbalist on phone: Not on file    Gets together: Not on file    Attends religious service: Not on file    Active member of club or organization: Not on file    Attends meetings of clubs or organizations: Not on file    Relationship status: Not on file  . Intimate partner violence    Fear of current or ex partner: Not on file    Emotionally abused: Not on file    Physically abused: Not on file    Forced sexual activity: Not on file  Other Topics Concern  . Not on file  Social History Narrative   ** Merged History Encounter **       Originally from [place].   Trained as an Chief Financial Officer.    Married '75- 82  years, divorced; Married '86. No children.    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 Sculptor-life size figures    by commission (see work in [place]  Sculpting work regularly.   Never smoking.   Alcohol: drinks1- 2 beers and 1-2 wines every night;    Learning the piano. Patient get regular exercise.   Wife is a Marine scientist.   Caffeine: 3/day  ADLs: drives; independent with ADLs   Advanced Directives: YES; FULL CODE; no prolonged measures.         Family History  Problem Relation Age of Onset  . Stomach cancer Father   . Cancer Father 81       stomach versus colon cancer  . Pancreatic cancer Sister   . Cancer Sister 1       pancreatic cancer  . Cancer Brother        penile cancer     Review of Systems  Constitutional: Negative.  Negative for chills and fever.  HENT: Negative.  Negative for congestion and sore throat.   Eyes: Negative.   Respiratory: Negative.  Negative for cough and shortness of breath.   Cardiovascular: Negative.  Negative for chest pain and palpitations.  Gastrointestinal: Negative.  Negative for abdominal pain, diarrhea, nausea and vomiting.  Genitourinary: Negative.  Negative for dysuria and hematuria.  Musculoskeletal: Positive for back pain.  Skin: Negative.  Negative for rash.  Neurological: Negative.  Negative for dizziness, tingling, sensory change, focal weakness, loss of consciousness and headaches.  Endo/Heme/Allergies: Negative.   All other systems reviewed and are negative.   Vitals:   11/25/18 1538  BP: (!) 143/76  Pulse: 73  Resp: 16  Temp: 98.3 F (36.8 C)  SpO2: 96%    Physical Exam Vitals signs reviewed.  Constitutional:      Appearance: Normal appearance.  HENT:     Head: Normocephalic.  Eyes:     Extraocular Movements: Extraocular movements intact.     Pupils: Pupils are equal, round, and reactive to light.  Neck:     Musculoskeletal: Normal  range of motion.  Cardiovascular:     Rate and Rhythm: Normal rate and regular rhythm.  Pulmonary:     Effort: Pulmonary effort is normal.  Abdominal:     General: There is no distension.     Palpations: Abdomen is soft.     Tenderness: There is no abdominal tenderness.  Musculoskeletal:     Lumbar back: He exhibits decreased range of motion, tenderness and pain. He exhibits no bony tenderness, no swelling, no spasm and normal pulse.     Comments: Right hip: Some tenderness to palpation.  Also tenderness to right lumbar area.  No erythema.  No rashes.  Limited range of motion of hip due to pain.  No bony tenderness of the lumbar area.  Lymphadenopathy:     Cervical: No cervical adenopathy.  Skin:    General: Skin is warm and dry.     Capillary Refill: Capillary refill takes less than 2 seconds.  Neurological:     General: No focal deficit present.     Mental Status: He is alert and oriented to person, place, and time.  Psychiatric:        Mood and Affect: Mood normal.        Behavior: Behavior normal.      ASSESSMENT & PLAN: Eathyn was seen today for back pain.  Diagnoses and all orders for this visit:  Right hip pain -     methylPREDNISolone acetate (DEPO-MEDROL) injection 80 mg -     Ambulatory referral to Orthopedic Surgery -     traMADol (ULTRAM) 50 MG tablet; Take 1 tablet (50 mg total) by mouth every 8 (eight) hours as needed for up to 5 days.  Osteoarthritis of multiple joints, unspecified osteoarthritis type -     Ambulatory referral to Orthopedic Surgery    Patient Instructions  If you have lab work done today you will be contacted with your lab results within the next 2 weeks.  If you have not heard from [place] then please contact us. The fastest way to get your results is to register for My Chart.   IF you received an x-ray today, you will receive an invoice from [facility]. Please contact [facility] at [phone] with questions or  concerns regarding your invoice.   IF you received labwork today, you will receive an invoice from [facility]. Please contact [facility] at [phone] with questions or concerns regarding your invoice.   Our billing staff will not be able to assist you with questions regarding bills from these companies.  You will be contacted with the lab results as soon as they are available. The fastest way to get your results is to activate your My Chart account. Instructions are located on the last page of this paperwork. If you have not heard from [place] regarding the results in 2 weeks, please contact this office.     Hip Pain  The hip is the joint between the upper legs and the lower pelvis. The bones, cartilage, tendons, and muscles of your hip joint support your body and allow you to move around. Hip pain can range from a minor ache to severe pain in one or both of your hips. The pain may be felt on the inside of the hip joint near the groin, or the outside near the buttocks and upper thigh. You may also have swelling or stiffness. Follow these instructions at home: Managing pain, stiffness, and swelling  If directed, apply ice to the injured area. ? Put ice in a plastic bag. ? Place a towel between your skin and the bag. ? Leave the ice on for 20 minutes, 2-3 times a day  Sleep with a pillow between your legs on your most comfortable side.  Avoid any activities that cause pain. General instructions  Take over-the-counter and prescription medicines only as told by your health care provider.  Do any exercises as told by your health care provider.  Record the following: ? How often you have hip pain. ? The location of your pain. ? What the pain feels like. ? What makes the pain worse.  Keep all follow-up visits as told by your health care provider. This is important. Contact a health care provider if:  You cannot put weight on your leg.  Your pain or swelling continues or gets worse after  one week.  It gets harder to walk.  You have a fever. Get help right away if:  You fall.  You have a sudden increase in pain and swelling in your hip.  Your hip is red or swollen or very tender to touch. Summary  Hip pain can range from a minor ache to severe pain in one or both of your hips.  The pain may be felt on the inside of the hip joint near the groin, or the outside near the buttocks and upper thigh.  Avoid any activities that cause pain.  Record how often you have hip pain, the location of the pain, what makes it worse and what it feels like. This information is not intended to replace advice given to you by your health care provider. Make sure you discuss any questions you have with your health care provider. Document Released: 06/29/2009 Document Revised: 12/22/2016 Document Reviewed: 12/13/2015 Elsevier Patient Education  2020 [place].  Osteoarthritis  Osteoarthritis is a type  of arthritis that affects tissue that covers the ends of bones in joints (cartilage). Cartilage acts as a cushion between the bones and helps them move smoothly. Osteoarthritis results when cartilage in the joints gets worn down. Osteoarthritis is sometimes called "wear and tear" arthritis. Osteoarthritis is the most common form of arthritis. It often occurs in older people. It is a condition that gets worse over time (a progressive condition). Joints that are most often affected by this condition are in:  Fingers.  Toes.  Hips.  Knees.  Spine, including neck and lower back. What are the causes? This condition is caused by age-related wearing down of cartilage that covers the ends of bones. What increases the risk? The following factors may make you more likely to develop this condition:  Older age.  Being overweight or obese.  Overuse of joints, such as in athletes.  Past injury of a joint.  Past surgery on a joint.  Family history of osteoarthritis. What are the signs  or symptoms? The main symptoms of this condition are pain, swelling, and stiffness in the joint. The joint may lose its shape over time. Small pieces of bone or cartilage may break off and float inside of the joint, which may cause more pain and damage to the joint. Small deposits of bone (osteophytes) may grow on the edges of the joint. Other symptoms may include:  A grating or scraping feeling inside the joint when you move it.  Popping or creaking sounds when you move. Symptoms may affect one or more joints. Osteoarthritis in a major joint, such as your knee or hip, can make it painful to walk or exercise. If you have osteoarthritis in your hands, you might not be able to grip items, twist your hand, or control small movements of your hands and fingers (fine motor skills). How is this diagnosed? This condition may be diagnosed based on:  Your medical history.  A physical exam.  Your symptoms.  X-rays of the affected joint(s).  Blood tests to rule out other types of arthritis. How is this treated? There is no cure for this condition, but treatment can help to control pain and improve joint function. Treatment plans may include:  A prescribed exercise program that allows for rest and joint relief. You may work with a physical therapist.  A weight control plan.  Pain relief techniques, such as: ? Applying heat and cold to the joint. ? Electric pulses delivered to nerve endings under the skin (transcutaneous electrical nerve stimulation, or TENS). ? Massage. ? Certain nutritional supplements.  NSAIDs or prescription medicines to help relieve pain.  Medicine to help relieve pain and inflammation (corticosteroids). This can be given by mouth (orally) or as an injection.  Assistive devices, such as a brace, wrap, splint, specialized glove, or cane.  Surgery, such as: ? An osteotomy. This is done to reposition the bones and relieve pain or to remove loose pieces of bone and  cartilage. ? Joint replacement surgery. You may need this surgery if you have very bad (advanced) osteoarthritis. Follow these instructions at home: Activity  Rest your affected joints as directed by your health care provider.  Do not drive or use heavy machinery while taking prescription pain medicine.  Exercise as directed. Your health care provider or physical therapist may recommend specific types of exercise, such as: ? Strengthening exercises. These are done to strengthen the muscles that support joints that are affected by arthritis. They can be performed with weights or with exercise  bands to add resistance. ? Aerobic activities. These are exercises, such as brisk walking or water aerobics, that get your heart pumping. ? Range-of-motion activities. These keep your joints easy to move. ? Balance and agility exercises. Managing pain, stiffness, and swelling      If directed, apply heat to the affected area as often as told by your health care provider. Use the heat source that your health care provider recommends, such as a moist heat pack or a heating pad. ? If you have a removable assistive device, remove it as told by your health care provider. ? Place a towel between your skin and the heat source. If your health care provider tells you to keep the assistive device on while you apply heat, place a towel between the assistive device and the heat source. ? Leave the heat on for 20-30 minutes. ? Remove the heat if your skin turns bright red. This is especially important if you are unable to feel pain, heat, or cold. You may have a greater risk of getting burned.  If directed, put ice on the affected joint: ? If you have a removable assistive device, remove it as told by your health care provider. ? Put ice in a plastic bag. ? Place a towel between your skin and the bag. If your health care provider tells you to keep the assistive device on during icing, place a towel between the  assistive device and the bag. ? Leave the ice on for 20 minutes, 2-3 times a day. General instructions  Take over-the-counter and prescription medicines only as told by your health care provider.  Maintain a healthy weight. Follow instructions from your health care provider for weight control. These may include dietary restrictions.  Do not use any products that contain nicotine or tobacco, such as cigarettes and e-cigarettes. These can delay bone healing. If you need help quitting, ask your health care provider.  Use assistive devices as directed by your health care provider.  Keep all follow-up visits as told by your health care provider. This is important. Where to find more information  [employer] of Arthritis and Musculoskeletal and Skin Diseases: www.niams.SouthExposed.es  [employer] on Aging: http://kim-miller.com/  American College of Rheumatology: www.rheumatology.org Contact a health care provider if:  Your skin turns red.  You develop a rash.  You have pain that gets worse.  You have a fever along with joint or muscle aches. Get help right away if:  You lose a lot of weight.  You suddenly lose your appetite.  You have night sweats. Summary  Osteoarthritis is a type of arthritis that affects tissue covering the ends of bones in joints (cartilage).  This condition is caused by age-related wearing down of cartilage that covers the ends of bones.  The main symptom of this condition is pain, swelling, and stiffness in the joint.  There is no cure for this condition, but treatment can help to control pain and improve joint function. This information is not intended to replace advice given to you by your health care provider. Make sure you discuss any questions you have with your health care provider. Document Released: 01/09/2005 Document Revised: 12/22/2016 Document Reviewed: 09/13/2015 Elsevier Patient Education  2020 [employer] Inc.      Agustina Caroli,  MD [facility]

## 2018-11-26 ENCOUNTER — Ambulatory Visit: Payer: Medicare Other | Admitting: Emergency Medicine

## 2018-12-02 ENCOUNTER — Other Ambulatory Visit: Payer: Self-pay

## 2018-12-02 ENCOUNTER — Ambulatory Visit (INDEPENDENT_AMBULATORY_CARE_PROVIDER_SITE_OTHER): Payer: Medicare Other | Admitting: Family Medicine

## 2018-12-02 ENCOUNTER — Encounter: Payer: Self-pay | Admitting: Family Medicine

## 2018-12-02 DIAGNOSIS — G8929 Other chronic pain: Secondary | ICD-10-CM | POA: Diagnosis not present

## 2018-12-02 DIAGNOSIS — M5441 Lumbago with sciatica, right side: Secondary | ICD-10-CM

## 2018-12-02 DIAGNOSIS — M25551 Pain in right hip: Secondary | ICD-10-CM

## 2018-12-02 MED ORDER — METHYLPREDNISOLONE 4 MG PO TBPK: ORAL_TABLET | ORAL | 0 refills | Status: DC

## 2018-12-02 NOTE — Progress Notes (Signed)
I saw and examined the patient with Dr. Mayer Masker and agree with assessment and plan as outlined.    Lumbar scoliosis with spondylosis, right-sided radicular pain.  Improved with IM injection.  Will refer to [place], try medrol pack.  If fails to improve to baseline, will order MRI.

## 2018-12-02 NOTE — Progress Notes (Signed)
Oscar Smith - 73 y.o. male MRN [id number]  Date of birth: 1945-04-30  Office Visit Note: Visit Date: 12/02/2018 PCP: Horald Pollen, MD Referred by: Horald Pollen, *  Subjective: Chief Complaint  Patient presents with  . Right Hip - Pain    Intermittent pain x over 1/2 year. "I have a bad back" and pain occasionally into the buttock and groin and anterior thigh. H/o hernia repair on that side. Recent flareup started after bending to tie his shoe.   HPI: Oscar Smith is a 73 y.o. male who comes in today with right lateral hip pain. He reports that he was bending forward to tie his shoe 1.5 weeks ago and felt a sharp pain in his lateral hip that extended around his side and down the front of his thigh. The first few days he was unable to dress himself due to pain. He went to see his primary physician on 11/2 and had an IM steroid injection that greatly improved pain and made it bearable. Pain is worse with bending forward, relieved by back extension. Feels better standing than sitting. He was given tramadol by PCP which helped some.   History of lumbar scoliosis with radiculopathy.  ROS Otherwise per HPI.  Assessment & Plan: Visit Diagnoses:  1. Pain in right hip   2. Chronic right-sided low back pain with right-sided sciatica    Suspect that pain in lateral hip is from lumbar radiculopathy as there is scoliosis in L2-L4 with spurring. Given significant improvement in symptoms with IM steroid injection, will trial steroid dose pack and refer to [facility] PT. If he does not get significant improvement in 3-4 weeks, will obtain MRI.   Meds & Orders:  Meds ordered this encounter  Medications  . methylPREDNISolone (MEDROL DOSEPAK) 4 MG TBPK tablet    Sig: As directed for 6 days.    Dispense:  21 tablet    Refill:  0   No orders of the defined types were placed in this encounter.   Follow-up: No follow-ups on file.   Procedures: No procedures performed   No notes on file   Clinical History: No specialty comments available.   He reports that he quit smoking about 53 years ago. His smoking use included cigarettes. He has never used smokeless tobacco. No results for input(s): HGBA1C, LABURIC in the last 8760 hours.  Objective:  VS:  HT:    WT:   BMI:     BP:   HR: bpm  TEMP: ( )  RESP:  Physical Exam  PHYSICAL EXAM: Gen: NAD, alert, cooperative with exam, well-appearing HEENT: clear conjunctiva,  CV:  no edema, capillary refill brisk, normal rate Resp: non-labored Skin: no rashes, normal turgor  Neuro: no gross deficits.  Psych:  alert and oriented  Ortho Exam  Lumbar spine: - Inspection: mild right curvature of lumbar spine  Palpation: No TTP over the spinous processes, paraspinal muscles, or SI joints b/l - ROM: full active lumbar extension. Reduced forward flexion- pain - Strength: 5/5 strength of lower extremity in L4-S1 nerve root distributions b/l; normal gait - Neuro: sensation intact in the L4-S1 nerve root distribution b/l, 2+ L4 and S1 reflexes - Special testing: positive straight leg raise,  negative Stork test, Negative FABER No pain with internal rotation of hip     Imaging: No results found.  Past Medical/Family/Surgical/Social History: Medications & Allergies reviewed per EMR, new medications updated. Patient Active Problem List   Diagnosis Date Noted  . Essential  hypertension 07/03/2018  . Pure hypercholesterolemia 07/03/2018  . Osteoarthritis of right hip 02/07/2018  . Lumbar radiculopathy 01/24/2017  . Enlarged prostate with urinary retention 12/25/2013  . Erectile dysfunction 12/25/2013  . Incomplete bladder emptying 08/27/2013  . Chronic sinusitis 02/20/2012  . Frontal mucocele 02/20/2012  . Nasal septal deviation 02/20/2012  . ADENOCARCINOMA, PROSTATE, GLEASON GRADE 6 12/21/2009  . GERD 11/12/2008   Past Medical History:  Diagnosis Date  . Adenocarcinoma of prostate [facility]) urologist-  dr  Tresa Moore--  dx Nov 2011--  T1c, Gleason 3+3,  PSA 4.2 (post IMRT 03/2010   Low risk recurrence, Biochemical control,  last PSA 0.30 on 05-28-2014  . BPH (benign prostatic hyperplasia)   . Cancer ([place])   . Diverticulitis   . ED (erectile dysfunction) of organic origin   . GERD (gastroesophageal reflux disease)   . Hiatal hernia   . History of colon polyps    2002 and 2003 hyperplastic  . History of gastritis   . Hypertension   . Lower urinary tract symptoms (LUTS)   . Phimosis   . Sleep apnea    did not complete study.    . Wears hearing aid    bilateral   Family History  Problem Relation Age of Onset  . Stomach cancer Father   . Cancer Father 35       stomach versus colon cancer  . Pancreatic cancer Sister   . Cancer Sister 33       pancreatic cancer  . Cancer Brother        penile cancer   Past Surgical History:  Procedure Laterality Date  . ABDOMINAL HERNIA REPAIR    . CIRCUMCISION N/A 06/09/2015   Procedure: CIRCUMCISION ADULT WITH PENILE BLOCK;  Surgeon: Alexis Frock, MD;  Location: [facility];  Service: Urology;  Laterality: N/A;  . COLONOSCOPY  last one 11-15-2011  . ENDOVENOUS ABLATION SAPHENOUS VEIN W/ LASER Left 02/10/2016   endovenous laser ablation left greater saphenous vein and stab phlebectomy left leg by Curt Jews MD  . INGUINAL HERNIA REPAIR Right 12-23-2009  . left shoulder surgery    . LIPOMA EXCISION N/A 12/01/2016   Procedure: EXCISION POSTERIOR NECK MASS;  Surgeon: Irene Limbo, MD;  Location: [facility];  Service: Plastics;  Laterality: N/A;  . PROSTATE SURGERY  2009  . PROSTATE SURGERY    . SHOULDER ARTHROSCOPY WITH SUBACROMIAL DECOMPRESSION, ROTATOR CUFF REPAIR AND BICEP TENDON REPAIR Left 10-2014   Social History   Occupational History  . Occupation: Development worker, community  . Occupation: Chief Financial Officer  Tobacco Use  . Smoking status: Former Smoker    Types: Cigarettes    Quit date: 09/04/1965    Years since quitting: 53.2   . Smokeless tobacco: Never Used  . Tobacco comment: quit around 73 yrs old.  Substance and Sexual Activity  . Alcohol use: Yes    Alcohol/week: 20.0 standard drinks    Types: 20 Standard drinks or equivalent per week    Comment: nightly  . Drug use: No  . Sexual activity: Yes    Partners: Male    Birth control/protection: Post-menopausal

## 2018-12-10 DIAGNOSIS — M545 Low back pain: Secondary | ICD-10-CM | POA: Diagnosis not present

## 2018-12-18 DIAGNOSIS — M545 Low back pain: Secondary | ICD-10-CM | POA: Diagnosis not present

## 2018-12-23 DIAGNOSIS — M545 Low back pain: Secondary | ICD-10-CM | POA: Diagnosis not present

## 2019-01-01 ENCOUNTER — Encounter: Payer: Self-pay | Admitting: Emergency Medicine

## 2019-01-02 DIAGNOSIS — L738 Other specified follicular disorders: Secondary | ICD-10-CM | POA: Diagnosis not present

## 2019-01-02 DIAGNOSIS — L821 Other seborrheic keratosis: Secondary | ICD-10-CM | POA: Diagnosis not present

## 2019-01-02 DIAGNOSIS — D225 Melanocytic nevi of trunk: Secondary | ICD-10-CM | POA: Diagnosis not present

## 2019-01-02 DIAGNOSIS — D1801 Hemangioma of skin and subcutaneous tissue: Secondary | ICD-10-CM | POA: Diagnosis not present

## 2019-02-04 DIAGNOSIS — H903 Sensorineural hearing loss, bilateral: Secondary | ICD-10-CM | POA: Diagnosis not present

## 2019-02-04 DIAGNOSIS — H6123 Impacted cerumen, bilateral: Secondary | ICD-10-CM | POA: Diagnosis not present

## 2019-02-06 ENCOUNTER — Other Ambulatory Visit: Payer: Self-pay | Admitting: Emergency Medicine

## 2019-02-21 ENCOUNTER — Ambulatory Visit: Payer: Medicare Other

## 2019-02-27 ENCOUNTER — Ambulatory Visit: Payer: 59 | Attending: Internal Medicine

## 2019-02-27 DIAGNOSIS — Z23 Encounter for immunization: Secondary | ICD-10-CM | POA: Insufficient documentation

## 2019-02-27 NOTE — Progress Notes (Signed)
   Covid-19 Vaccination Clinic  Name:  Oscar Smith    MRN: [id number] DOB: 09/22/1945  02/27/2019  Mr. [name] was observed post Covid-19 immunization for 15 minutes without incidence. He was provided with Vaccine Information Sheet and instruction to access the V-Safe system.   Mr. [name] was instructed to call 911 with any severe reactions post vaccine: Marland Kitchen Difficulty breathing  . Swelling of your face and throat  . A fast heartbeat  . A bad rash all over your body  . Dizziness and weakness    Immunizations Administered    Name Date Dose VIS Date Route   Pfizer COVID-19 Vaccine 02/27/2019 10:24 AM 0.3 mL 01/03/2019 Intramuscular   Manufacturer: [place]   Lot: [id number]   [place]: [id number]

## 2019-03-04 DIAGNOSIS — H31003 Unspecified chorioretinal scars, bilateral: Secondary | ICD-10-CM | POA: Diagnosis not present

## 2019-03-04 DIAGNOSIS — H5203 Hypermetropia, bilateral: Secondary | ICD-10-CM | POA: Diagnosis not present

## 2019-03-04 DIAGNOSIS — H524 Presbyopia: Secondary | ICD-10-CM | POA: Diagnosis not present

## 2019-03-04 DIAGNOSIS — H2513 Age-related nuclear cataract, bilateral: Secondary | ICD-10-CM | POA: Diagnosis not present

## 2019-03-19 ENCOUNTER — Encounter: Payer: Self-pay | Admitting: Emergency Medicine

## 2019-03-19 ENCOUNTER — Ambulatory Visit (INDEPENDENT_AMBULATORY_CARE_PROVIDER_SITE_OTHER): Payer: Medicare Other | Admitting: Emergency Medicine

## 2019-03-19 ENCOUNTER — Other Ambulatory Visit: Payer: Self-pay

## 2019-03-19 VITALS — BP 128/78 | HR 79 | Temp 98.2°F | Resp 18 | Ht 70.0 in | Wt 188.0 lb

## 2019-03-19 DIAGNOSIS — E7800 Pure hypercholesterolemia, unspecified: Secondary | ICD-10-CM

## 2019-03-19 DIAGNOSIS — R531 Weakness: Secondary | ICD-10-CM | POA: Diagnosis not present

## 2019-03-19 DIAGNOSIS — S39012A Strain of muscle, fascia and tendon of lower back, initial encounter: Secondary | ICD-10-CM | POA: Diagnosis not present

## 2019-03-19 DIAGNOSIS — R5383 Other fatigue: Secondary | ICD-10-CM

## 2019-03-19 DIAGNOSIS — M549 Dorsalgia, unspecified: Secondary | ICD-10-CM

## 2019-03-19 DIAGNOSIS — I10 Essential (primary) hypertension: Secondary | ICD-10-CM

## 2019-03-19 MED ORDER — METHYLPREDNISOLONE ACETATE 80 MG/ML IJ SUSP
80.0000 mg | Freq: Once | INTRAMUSCULAR | Status: AC
  Administered 2019-03-19: 80 mg via INTRAMUSCULAR

## 2019-03-19 NOTE — Progress Notes (Signed)
Oscar Smith 74 y.o.   Chief Complaint  Patient presents with  . Hypertension    6 months follow up     HISTORY OF PRESENT ILLNESS: This is a 74 y.o. male with history of hypertension here for follow-up.  Blood pressure readings at home at an acceptable range. BP Readings from Last 3 Encounters:  03/19/19 128/78  11/25/18 (!) 143/76  09/18/18 (!) 159/74  Has 3 complaints: 1.  Feeling tired over the last year, blames the pandemic.  Stays physically and mentally active.  Tries to walk 1 mile every day weather permits.  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 2.  Trouble swallowing, dysphagia, since he was a child.  No known problems.  No new clinical features.  Able to eat and drink. Wt Readings from Last 3 Encounters:  03/19/19 188 lb (85.3 kg)  11/25/18 186 lb 6.4 oz (84.6 kg)  09/18/18 186 lb 9.6 oz (84.6 kg)    3.  Low back pain, chronic, on and off for the past several weeks.  Requesting "shot" he has had before in this office with good results.  No other complaints or medical concerns today. HPI   Prior to Admission medications   Medication Sig Start Date End Date Taking? Authorizing Provider  aspirin EC 81 MG tablet Take 81 mg daily by mouth.   Yes [provider]  atorvastatin (LIPITOR) 10 MG tablet TAKE 1 TABLET ONCE DAILY. 02/06/19  Yes Tawfiq Favila, Ines Bloomer, MD  fluticasone [facility]) 50 MCG/ACT nasal spray Place 2 sprays into both nostrils daily. 09/03/18  Yes Amarien Carne, Ines Bloomer, MD  ipratropium (ATROVENT) 0.03 % nasal spray Place 2 sprays into both nostrils 2 (two) times daily. 06/25/17  Yes Wardell Honour, MD  lisinopril (ZESTRIL) 40 MG tablet Take 1 tablet (40 mg total) by mouth daily. 11/11/18  Yes Abass Misener, Ines Bloomer, MD  methylPREDNISolone (MEDROL DOSEPAK) 4 MG TBPK tablet As directed for 6 days. 12/02/18  Yes Hilts, Legrand Como, MD  Multiple Vitamin (MULTIVITAMIN) tablet Take 1 tablet by mouth daily.   Yes [provider]  Omega-3 Fatty Acids (FISH OIL OMEGA-3 PO) Take by mouth.   Yes [provider]  omeprazole (PRILOSEC) 20 MG capsule TAKE 1 CAPSULE 15-20 MINUTES BEFORE BREAKFAST. 02/06/19  Yes [place], Ines Bloomer, MD  OVER THE COUNTER MEDICATION OTC Ultraflora taking prn for IB   Yes [provider]  fluticasone (FLONASE) 50 MCG/ACT nasal spray USE 2 SPRAYS IN EACH NOSTRIL ONCE A DAY. 08/31/18   Horald Pollen, MD  neomycin-polymyxin-dexamethasone (MAXITROL) 0.1 % ophthalmic suspension neomycin-polymyxin-dexameth 3.5 mg/mL-10,000 unit/mL-0.1% eye drops    [provider]    Allergies  Allergen Reactions  . Flomax [Tamsulosin Hcl]     "bad reaction" jaw pain    Patient Active Problem List   Diagnosis Date Noted  . Essential hypertension 07/03/2018  . Pure hypercholesterolemia 07/03/2018  . Osteoarthritis of right hip 02/07/2018  . Lumbar radiculopathy 01/24/2017  . Enlarged prostate with urinary retention 12/25/2013  . Erectile dysfunction 12/25/2013  . Incomplete bladder emptying 08/27/2013  . Chronic sinusitis 02/20/2012  . Frontal mucocele 02/20/2012  . Nasal septal deviation 02/20/2012  . ADENOCARCINOMA, PROSTATE, GLEASON GRADE 6 12/21/2009  . GERD 11/12/2008    Past Medical History:  Diagnosis Date  . Adenocarcinoma of prostate [facility]) urologist-  dr Tresa Moore--  dx Nov 2011--  T1c, Gleason 3+3,  PSA 4.2 (post IMRT 03/2010   Low  risk recurrence, Biochemical control,  last PSA 0.30 on 05-28-2014  . BPH (benign prostatic hyperplasia)   . Cancer ([place])   . Diverticulitis   . ED (erectile dysfunction) of organic origin   . GERD (gastroesophageal reflux disease)   . Hiatal hernia   . History of colon polyps    2002 and 2003 hyperplastic  . History of gastritis   . Hypertension   . Lower urinary tract symptoms (LUTS)   . Phimosis   . Sleep apnea    did not complete study.    . Wears hearing aid    bilateral    Past Surgical History:    Procedure Laterality Date  . ABDOMINAL HERNIA REPAIR    . CIRCUMCISION N/A 06/09/2015   Procedure: CIRCUMCISION ADULT WITH PENILE BLOCK;  Surgeon: Alexis Frock, MD;  Location: [facility];  Service: Urology;  Laterality: N/A;  . COLONOSCOPY  last one 11-15-2011  . ENDOVENOUS ABLATION SAPHENOUS VEIN W/ LASER Left 02/10/2016   endovenous laser ablation left greater saphenous vein and stab phlebectomy left leg by Curt Jews MD  . INGUINAL HERNIA REPAIR Right 12-23-2009  . left shoulder surgery    . LIPOMA EXCISION N/A 12/01/2016   Procedure: EXCISION POSTERIOR NECK MASS;  Surgeon: Irene Limbo, MD;  Location: [facility];  Service: Plastics;  Laterality: N/A;  . PROSTATE SURGERY  2009  . PROSTATE SURGERY    . SHOULDER ARTHROSCOPY WITH SUBACROMIAL DECOMPRESSION, ROTATOR CUFF REPAIR AND BICEP TENDON REPAIR Left 10-2014    Social History   Socioeconomic History  . Marital status: Married    Spouse name: Not on file  . Number of children: 0  . Years of education: BS  . Highest education level: Not on file  Occupational History  . Occupation: Development worker, community  . Occupation: Chief Financial Officer  Tobacco Use  . Smoking status: Former Smoker    Types: Cigarettes    Quit date: 09/04/1965    Years since quitting: 53.5  . Smokeless tobacco: Never Used  . Tobacco comment: quit around 74 yrs old.  Substance and Sexual Activity  . Alcohol use: Yes    Alcohol/week: 20.0 standard drinks    Types: 20 Standard drinks or equivalent per week    Comment: nightly  . Drug use: No  . Sexual activity: Yes    Partners: Male    Birth control/protection: Post-menopausal  Other Topics Concern  . Not on file  Social History Narrative   ** Merged History Encounter **       Originally from [place].   Trained as an Chief Financial Officer.    Married '75- 68 years, divorced; Married '86. No children.    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  Sculpting work regularly.   Never smoking.   Alcohol: drinks1- 2 beers and 1-2 wines every night;    Learning the piano. Patient get regular exercise.   Wife is a Marine scientist.   Caffeine: 3/day    ADLs: drives; independent with ADLs   Advanced Directives: YES; FULL CODE; no prolonged measures.        Social Determinants of Health   Financial Resource Strain:   . Difficulty of Paying Living Expenses: Not on file  Food Insecurity:   . Worried About Charity fundraiser in the Last Year: Not on file  . Ran Out of Food in  the Last Year: Not on file  Transportation Needs:   . Lack of Transportation (Medical): Not on file  . Lack of Transportation (Non-Medical): Not on file  Physical Activity:   . Days of Exercise per Week: Not on file  . Minutes of Exercise per Session: Not on file  Stress:   . Feeling of Stress : Not on file  Social Connections:   . Frequency of Communication with Friends and Family: Not on file  . Frequency of Social Gatherings with Friends and Family: Not on file  . Attends Religious Services: Not on file  . Active Member of Clubs or Organizations: Not on file  . Attends Archivist Meetings: Not on file  . Marital Status: Not on file  Intimate Partner Violence:   . Fear of Current or Ex-Partner: Not on file  . Emotionally Abused: Not on file  . Physically Abused: Not on file  . Sexually Abused: Not on file    Family History  Problem Relation Age of Onset  . Stomach cancer Father   . Cancer Father 45       stomach versus colon cancer  . Pancreatic cancer Sister   . Cancer Sister 37       pancreatic cancer  . Cancer Brother        penile cancer     Review of Systems  Constitutional: Positive for malaise/fatigue. Negative for chills and fever.  HENT: Negative.  Negative for congestion and sore throat.   Respiratory: Negative.  Negative for  cough and shortness of breath.   Cardiovascular: Negative.  Negative for chest pain and palpitations.  Gastrointestinal: Negative.  Negative for abdominal pain, blood in stool, diarrhea, melena, nausea and vomiting.  Genitourinary: Negative.  Negative for dysuria and hematuria.  Musculoskeletal: Positive for back pain and joint pain.  Skin: Negative.  Negative for rash.  Neurological: Negative.  Negative for dizziness and headaches.  Endo/Heme/Allergies: Negative.   All other systems reviewed and are negative.  Vitals:   03/19/19 0845 03/19/19 0902  BP: (!) 160/79 128/78  Pulse: 79   Resp: 18   Temp: 98.2 F (36.8 C)   SpO2: 98%      Physical Exam Vitals reviewed.  Constitutional:      Appearance: Normal appearance.  HENT:     Head: Normocephalic.  Eyes:     Extraocular Movements: Extraocular movements intact.     Pupils: Pupils are equal, round, and reactive to light.  Cardiovascular:     Rate and Rhythm: Normal rate and regular rhythm.     Pulses: Normal pulses.     Heart sounds: Normal heart sounds.  Pulmonary:     Effort: Pulmonary effort is normal.     Breath sounds: Normal breath sounds.  Abdominal:     General: Bowel sounds are normal. There is no distension.     Palpations: Abdomen is soft.     Tenderness: There is no abdominal tenderness.  Musculoskeletal:        General: Normal range of motion.     Cervical back: Normal range of motion. No tenderness.     Lumbar back: Spasms and tenderness present. No bony tenderness.       Back:  Lymphadenopathy:     Cervical: No cervical adenopathy.  Skin:    General: Skin is warm and dry.     Capillary Refill: Capillary refill takes less than 2 seconds.  Neurological:     General: No focal deficit present.  Mental Status: He is alert and oriented to person, place, and time.  Psychiatric:        Mood and Affect: Mood normal.        Behavior: Behavior normal.    A total of 30 minutes was spent with the  patient, greater than 50% of which was in counseling/coordination of care regarding chronic medical problems, differential diagnosis of fatigue, need for diagnostic blood work, diet and nutrition, review of most recent office visit notes, review of most recent blood work results, prognosis and need for follow-up.   ASSESSMENT & PLAN: [place] was seen today for hypertension.  Diagnoses and all orders for this visit:  Fatigue, unspecified type -     TSH  Pure hypercholesterolemia -     Lipid panel  Essential hypertension -     CBC with Differential/Platelet -     Comprehensive metabolic panel  Lumbar strain, initial encounter -     methylPREDNISolone acetate (DEPO-MEDROL) injection 80 mg  General weakness  Musculoskeletal back pain    Patient Instructions       If you have lab work done today you will be contacted with your lab results within the next 2 weeks.  If you have not heard from [place] then please contact us. The fastest way to get your results is to register for My Chart.   IF you received an x-ray today, you will receive an invoice from [facility]. Please contact [facility] at [phone] with questions or concerns regarding your invoice.   IF you received labwork today, you will receive an invoice from [facility]. Please contact [facility] at [phone] with questions or concerns regarding your invoice.   Our billing staff will not be able to assist you with questions regarding bills from these companies.  You will be contacted with the lab results as soon as they are available. The fastest way to get your results is to activate your My Chart account. Instructions are located on the last page of this paperwork. If you have not heard from [place] regarding the results in 2 weeks, please contact this office.      Health Maintenance After Age 6 After age 63, you are at a higher risk for certain long-term diseases and infections as well as injuries  from falls. Falls are a major cause of broken bones and head injuries in people who are older than age 40. Getting regular preventive care can help to keep you healthy and well. Preventive care includes getting regular testing and making lifestyle changes as recommended by your health care provider. Talk with your health care provider about:  Which screenings and tests you should have. A screening is a test that checks for a disease when you have no symptoms.  A diet and exercise plan that is right for you. What should I know about screenings and tests to prevent falls? Screening and testing are the best ways to find a health problem early. Early diagnosis and treatment give you the best chance of managing medical conditions that are common after age 73. Certain conditions and lifestyle choices may make you more likely to have a fall. Your health care provider may recommend:  Regular vision checks. Poor vision and conditions such as cataracts can make you more likely to have a fall. If you wear glasses, make sure to get your prescription updated if your vision changes.  Medicine review. Work with your health care provider to regularly review all of the medicines you are taking,  including over-the-counter medicines. Ask your health care provider about any side effects that may make you more likely to have a fall. Tell your health care provider if any medicines that you take make you feel dizzy or sleepy.  Osteoporosis screening. Osteoporosis is a condition that causes the bones to get weaker. This can make the bones weak and cause them to break more easily.  Blood pressure screening. Blood pressure changes and medicines to control blood pressure can make you feel dizzy.  Strength and balance checks. Your health care provider may recommend certain tests to check your strength and balance while standing, walking, or changing positions.  Foot health exam. Foot pain and numbness, as well as not wearing  proper footwear, can make you more likely to have a fall.  Depression screening. You may be more likely to have a fall if you have a fear of falling, feel emotionally low, or feel unable to do activities that you used to do.  Alcohol use screening. Using too much alcohol can affect your balance and may make you more likely to have a fall. What actions can I take to lower my risk of falls? General instructions  Talk with your health care provider about your risks for falling. Tell your health care provider if: ? You fall. Be sure to tell your health care provider about all falls, even ones that seem minor. ? You feel dizzy, sleepy, or off-balance.  Take over-the-counter and prescription medicines only as told by your health care provider. These include any supplements.  Eat a healthy diet and maintain a healthy weight. A healthy diet includes low-fat dairy products, low-fat (lean) meats, and fiber from whole grains, beans, and lots of fruits and vegetables. Home safety  Remove any tripping hazards, such as rugs, cords, and clutter.  Install safety equipment such as grab bars in bathrooms and safety rails on stairs.  Keep rooms and walkways well-lit. Activity   Follow a regular exercise program to stay fit. This will help you maintain your balance. Ask your health care provider what types of exercise are appropriate for you.  If you need a cane or walker, use it as recommended by your health care provider.  Wear supportive shoes that have nonskid soles. Lifestyle  Do not drink alcohol if your health care provider tells you not to drink.  If you drink alcohol, limit how much you have: ? 0-1 drink a day for women. ? 0-2 drinks a day for men.  Be aware of how much alcohol is in your drink. In the [place] one drink equals one typical bottle of beer (12 oz), one-half glass of wine (5 oz), or one shot of hard liquor (1 oz).  Do not use any products that contain nicotine or tobacco,  such as cigarettes and e-cigarettes. If you need help quitting, ask your health care provider. Summary  Having a healthy lifestyle and getting preventive care can help to protect your health and wellness after age 75.  Screening and testing are the best way to find a health problem early and help you avoid having a fall. Early diagnosis and treatment give you the best chance for managing medical conditions that are more common for people who are older than age 12.  Falls are a major cause of broken bones and head injuries in people who are older than age 55. Take precautions to prevent a fall at home.  Work with your health care provider to learn what changes you can make  to improve your health and wellness and to prevent falls. This information is not intended to replace advice given to you by your health care provider. Make sure you discuss any questions you have with your health care provider. Document Revised: 05/02/2018 Document Reviewed: 11/22/2016 Elsevier Patient Education  [place].  Acute Back Pain, Adult Acute back pain is sudden and usually short-lived. It is often caused by an injury to the muscles and tissues in the back. The injury may result from:  A muscle or ligament getting overstretched or torn (strained). Ligaments are tissues that connect bones to each other. Lifting something improperly can cause a back strain.  Wear and tear (degeneration) of the spinal disks. Spinal disks are circular tissue that provides cushioning between the bones of the spine (vertebrae).  Twisting motions, such as while playing sports or doing yard work.  A hit to the back.  Arthritis. You may have a physical exam, lab tests, and imaging tests to find the cause of your pain. Acute back pain usually goes away with rest and home care. Follow these instructions at home: Managing pain, stiffness, and swelling  Take over-the-counter and prescription medicines only as told by your  health care provider.  Your health care provider may recommend applying ice during the first 24-48 hours after your pain starts. To do this: ? Put ice in a plastic bag. ? Place a towel between your skin and the bag. ? Leave the ice on for 20 minutes, 2-3 times a day.  If directed, apply heat to the affected area as often as told by your health care provider. Use the heat source that your health care provider recommends, such as a moist heat pack or a heating pad. ? Place a towel between your skin and the heat source. ? Leave the heat on for 20-30 minutes. ? Remove the heat if your skin turns bright red. This is especially important if you are unable to feel pain, heat, or cold. You have a greater risk of getting burned. Activity   Do not stay in bed. Staying in bed for more than 1-2 days can delay your recovery.  Sit up and stand up straight. Avoid leaning forward when you sit, or hunching over when you stand. ? If you work at a desk, sit close to it so you do not need to lean over. Keep your chin tucked in. Keep your neck drawn back, and keep your elbows bent at a right angle. Your arms should look like the letter "L." ? Sit high and close to the steering wheel when you drive. Add lower back (lumbar) support to your car seat, if needed.  Take short walks on even surfaces as soon as you are able. Try to increase the length of time you walk each day.  Do not sit, drive, or stand in one place for more than 30 minutes at a time. Sitting or standing for long periods of time can put stress on your back.  Do not drive or use heavy machinery while taking prescription pain medicine.  Use proper lifting techniques. When you bend and lift, use positions that put less stress on your back: ? [place] your knees. ? Keep the load close to your body. ? Avoid twisting.  Exercise regularly as told by your health care provider. Exercising helps your back heal faster and helps prevent back injuries by  keeping muscles strong and flexible.  Work with a physical therapist to make a safe exercise program,  as recommended by your health care provider. Do any exercises as told by your physical therapist. Lifestyle  Maintain a healthy weight. Extra weight puts stress on your back and makes it difficult to have good posture.  Avoid activities or situations that make you feel anxious or stressed. Stress and anxiety increase muscle tension and can make back pain worse. Learn ways to manage anxiety and stress, such as through exercise. General instructions  Sleep on a firm mattress in a comfortable position. Try lying on your side with your knees slightly bent. If you lie on your back, put a pillow under your knees.  Follow your treatment plan as told by your health care provider. This may include: ? Cognitive or behavioral therapy. ? Acupuncture or massage therapy. ? Meditation or yoga. Contact a health care provider if:  You have pain that is not relieved with rest or medicine.  You have increasing pain going down into your legs or buttocks.  Your pain does not improve after 2 weeks.  You have pain at night.  You lose weight without trying.  You have a fever or chills. Get help right away if:  You develop new bowel or bladder control problems.  You have unusual weakness or numbness in your arms or legs.  You develop nausea or vomiting.  You develop abdominal pain.  You feel faint. Summary  Acute back pain is sudden and usually short-lived.  Use proper lifting techniques. When you bend and lift, use positions that put less stress on your back.  Take over-the-counter and prescription medicines and apply heat or ice as directed by your health care provider. This information is not intended to replace advice given to you by your health care provider. Make sure you discuss any questions you have with your health care provider. Document Revised: 04/30/2018 Document Reviewed:  08/23/2016 Elsevier Patient Education  [place].  Fatigue If you have fatigue, you feel tired all the time and have a lack of energy or a lack of motivation. Fatigue may make it difficult to start or complete tasks because of exhaustion. In general, occasional or mild fatigue is often a normal response to activity or life. However, long-lasting (chronic) or extreme fatigue may be a symptom of a medical condition. Follow these instructions at home: General instructions  Watch your fatigue for any changes.  Go to bed and get up at the same time every day.  Avoid fatigue by pacing yourself during the day and getting enough sleep at night.  Maintain a healthy weight. Medicines  Take over-the-counter and prescription medicines only as told by your health care provider.  Take a multivitamin, if told by your health care provider.  Do not use herbal or dietary supplements unless they are approved by your health care provider. Activity   Exercise regularly, as told by your health care provider.  Use or practice techniques to help you relax, such as yoga, tai chi, meditation, or massage therapy. Eating and drinking   Avoid heavy meals in the evening.  Eat a well-balanced diet, which includes lean proteins, whole grains, plenty of fruits and vegetables, and low-fat dairy products.  Avoid consuming too much caffeine.  Avoid the use of alcohol.  Drink enough fluid to keep your urine pale yellow. Lifestyle  Change situations that cause you stress. Try to keep your work and personal schedule in balance.  Do not use any products that contain nicotine or tobacco, such as cigarettes and e-cigarettes. If you need help  quitting, ask your health care provider.  Do not use drugs. Contact a health care provider if:  Your fatigue does not get better.  You have a fever.  You suddenly lose or gain weight.  You have headaches.  You have trouble falling asleep or sleeping  through the night.  You feel angry, guilty, anxious, or sad.  You are unable to have a bowel movement (constipation).  Your skin is dry.  You have swelling in your legs or another part of your body. Get help right away if:  You feel confused.  Your vision is blurry.  You feel faint or you pass out.  You have a severe headache.  You have severe pain in your abdomen, your back, or the area between your waist and hips (pelvis).  You have chest pain, shortness of breath, or an irregular or fast heartbeat.  You are unable to urinate, or you urinate less than normal.  You have abnormal bleeding, such as bleeding from the rectum, vagina, nose, lungs, or nipples.  You vomit blood.  You have thoughts about hurting yourself or others. If you ever feel like you may hurt yourself or others, or have thoughts about taking your own life, get help right away. You can go to your nearest emergency department or call:  Your local emergency services (911 in the [place]  A suicide crisis helpline, such as the [place] at [phone]. This is open 24 hours a day. Summary  If you have fatigue, you feel tired all the time and have a lack of energy or a lack of motivation.  Fatigue may make it difficult to start or complete tasks because of exhaustion.  Long-lasting (chronic) or extreme fatigue may be a symptom of a medical condition.  Exercise regularly, as told by your health care provider.  Change situations that cause you stress. Try to keep your work and personal schedule in balance. This information is not intended to replace advice given to you by your health care provider. Make sure you discuss any questions you have with your health care provider. Document Revised: 07/31/2018 Document Reviewed: 10/04/2016 Elsevier Patient Education  2020 Elsevier Inc.      Agustina Caroli, MD [facility]

## 2019-03-19 NOTE — Patient Instructions (Addendum)
If you have lab work done today you will be contacted with your lab results within the next 2 weeks.  If you have not heard from [place] then please contact us. The fastest way to get your results is to register for My Chart.   IF you received an x-ray today, you will receive an invoice from [facility]. Please contact [facility] at [phone] with questions or concerns regarding your invoice.   IF you received labwork today, you will receive an invoice from [place]. Please contact [facility] at [phone] with questions or concerns regarding your invoice.   Our billing staff will not be able to assist you with questions regarding bills from these companies.  You will be contacted with the lab results as soon as they are available. The fastest way to get your results is to activate your My Chart account. Instructions are located on the last page of this paperwork. If you have not heard from [place] regarding the results in 2 weeks, please contact this office.      Health Maintenance After Age 36 After age 63, you are at a higher risk for certain long-term diseases and infections as well as injuries from falls. Falls are a major cause of broken bones and head injuries in people who are older than age 25. Getting regular preventive care can help to keep you healthy and well. Preventive care includes getting regular testing and making lifestyle changes as recommended by your health care provider. Talk with your health care provider about:  Which screenings and tests you should have. A screening is a test that checks for a disease when you have no symptoms.  A diet and exercise plan that is right for you. What should I know about screenings and tests to prevent falls? Screening and testing are the best ways to find a health problem early. Early diagnosis and treatment give you the best chance of managing medical conditions that are common after age 57. Certain conditions and  lifestyle choices may make you more likely to have a fall. Your health care provider may recommend:  Regular vision checks. Poor vision and conditions such as cataracts can make you more likely to have a fall. If you wear glasses, make sure to get your prescription updated if your vision changes.  Medicine review. Work with your health care provider to regularly review all of the medicines you are taking, including over-the-counter medicines. Ask your health care provider about any side effects that may make you more likely to have a fall. Tell your health care provider if any medicines that you take make you feel dizzy or sleepy.  Osteoporosis screening. Osteoporosis is a condition that causes the bones to get weaker. This can make the bones weak and cause them to break more easily.  Blood pressure screening. Blood pressure changes and medicines to control blood pressure can make you feel dizzy.  Strength and balance checks. Your health care provider may recommend certain tests to check your strength and balance while standing, walking, or changing positions.  Foot health exam. Foot pain and numbness, as well as not wearing proper footwear, can make you more likely to have a fall.  Depression screening. You may be more likely to have a fall if you have a fear of falling, feel emotionally low, or feel unable to do activities that you used to do.  Alcohol use screening. Using too much alcohol can affect your balance and may make you more likely  to have a fall. What actions can I take to lower my risk of falls? General instructions  Talk with your health care provider about your risks for falling. Tell your health care provider if: ? You fall. Be sure to tell your health care provider about all falls, even ones that seem minor. ? You feel dizzy, sleepy, or off-balance.  Take over-the-counter and prescription medicines only as told by your health care provider. These include any  supplements.  Eat a healthy diet and maintain a healthy weight. A healthy diet includes low-fat dairy products, low-fat (lean) meats, and fiber from whole grains, beans, and lots of fruits and vegetables. Home safety  Remove any tripping hazards, such as rugs, cords, and clutter.  Install safety equipment such as grab bars in bathrooms and safety rails on stairs.  Keep rooms and walkways well-lit. Activity   Follow a regular exercise program to stay fit. This will help you maintain your balance. Ask your health care provider what types of exercise are appropriate for you.  If you need a cane or walker, use it as recommended by your health care provider.  Wear supportive shoes that have nonskid soles. Lifestyle  Do not drink alcohol if your health care provider tells you not to drink.  If you drink alcohol, limit how much you have: ? 0-1 drink a day for women. ? 0-2 drinks a day for men.  Be aware of how much alcohol is in your drink. In the [place] one drink equals one typical bottle of beer (12 oz), one-half glass of wine (5 oz), or one shot of hard liquor (1 oz).  Do not use any products that contain nicotine or tobacco, such as cigarettes and e-cigarettes. If you need help quitting, ask your health care provider. Summary  Having a healthy lifestyle and getting preventive care can help to protect your health and wellness after age 64.  Screening and testing are the best way to find a health problem early and help you avoid having a fall. Early diagnosis and treatment give you the best chance for managing medical conditions that are more common for people who are older than age 75.  Falls are a major cause of broken bones and head injuries in people who are older than age 30. Take precautions to prevent a fall at home.  Work with your health care provider to learn what changes you can make to improve your health and wellness and to prevent falls. This information is not intended  to replace advice given to you by your health care provider. Make sure you discuss any questions you have with your health care provider. Document Revised: 05/02/2018 Document Reviewed: 11/22/2016 Elsevier Patient Education  [place].  Acute Back Pain, Adult Acute back pain is sudden and usually short-lived. It is often caused by an injury to the muscles and tissues in the back. The injury may result from:  A muscle or ligament getting overstretched or torn (strained). Ligaments are tissues that connect bones to each other. Lifting something improperly can cause a back strain.  Wear and tear (degeneration) of the spinal disks. Spinal disks are circular tissue that provides cushioning between the bones of the spine (vertebrae).  Twisting motions, such as while playing sports or doing yard work.  A hit to the back.  Arthritis. You may have a physical exam, lab tests, and imaging tests to find the cause of your pain. Acute back pain usually goes away with rest and home  care. Follow these instructions at home: Managing pain, stiffness, and swelling  Take over-the-counter and prescription medicines only as told by your health care provider.  Your health care provider may recommend applying ice during the first 24-48 hours after your pain starts. To do this: ? Put ice in a plastic bag. ? Place a towel between your skin and the bag. ? Leave the ice on for 20 minutes, 2-3 times a day.  If directed, apply heat to the affected area as often as told by your health care provider. Use the heat source that your health care provider recommends, such as a moist heat pack or a heating pad. ? Place a towel between your skin and the heat source. ? Leave the heat on for 20-30 minutes. ? Remove the heat if your skin turns bright red. This is especially important if you are unable to feel pain, heat, or cold. You have a greater risk of getting burned. Activity   Do not stay in bed. Staying in  bed for more than 1-2 days can delay your recovery.  Sit up and stand up straight. Avoid leaning forward when you sit, or hunching over when you stand. ? If you work at a desk, sit close to it so you do not need to lean over. Keep your chin tucked in. Keep your neck drawn back, and keep your elbows bent at a right angle. Your arms should look like the letter "L." ? Sit high and close to the steering wheel when you drive. Add lower back (lumbar) support to your car seat, if needed.  Take short walks on even surfaces as soon as you are able. Try to increase the length of time you walk each day.  Do not sit, drive, or stand in one place for more than 30 minutes at a time. Sitting or standing for long periods of time can put stress on your back.  Do not drive or use heavy machinery while taking prescription pain medicine.  Use proper lifting techniques. When you bend and lift, use positions that put less stress on your back: ? [place] your knees. ? Keep the load close to your body. ? Avoid twisting.  Exercise regularly as told by your health care provider. Exercising helps your back heal faster and helps prevent back injuries by keeping muscles strong and flexible.  Work with a physical therapist to make a safe exercise program, as recommended by your health care provider. Do any exercises as told by your physical therapist. Lifestyle  Maintain a healthy weight. Extra weight puts stress on your back and makes it difficult to have good posture.  Avoid activities or situations that make you feel anxious or stressed. Stress and anxiety increase muscle tension and can make back pain worse. Learn ways to manage anxiety and stress, such as through exercise. General instructions  Sleep on a firm mattress in a comfortable position. Try lying on your side with your knees slightly bent. If you lie on your back, put a pillow under your knees.  Follow your treatment plan as told by your health care  provider. This may include: ? Cognitive or behavioral therapy. ? Acupuncture or massage therapy. ? Meditation or yoga. Contact a health care provider if:  You have pain that is not relieved with rest or medicine.  You have increasing pain going down into your legs or buttocks.  Your pain does not improve after 2 weeks.  You have pain at night.  You lose weight  without trying.  You have a fever or chills. Get help right away if:  You develop new bowel or bladder control problems.  You have unusual weakness or numbness in your arms or legs.  You develop nausea or vomiting.  You develop abdominal pain.  You feel faint. Summary  Acute back pain is sudden and usually short-lived.  Use proper lifting techniques. When you bend and lift, use positions that put less stress on your back.  Take over-the-counter and prescription medicines and apply heat or ice as directed by your health care provider. This information is not intended to replace advice given to you by your health care provider. Make sure you discuss any questions you have with your health care provider. Document Revised: 04/30/2018 Document Reviewed: 08/23/2016 Elsevier Patient Education  [place].  Fatigue If you have fatigue, you feel tired all the time and have a lack of energy or a lack of motivation. Fatigue may make it difficult to start or complete tasks because of exhaustion. In general, occasional or mild fatigue is often a normal response to activity or life. However, long-lasting (chronic) or extreme fatigue may be a symptom of a medical condition. Follow these instructions at home: General instructions  Watch your fatigue for any changes.  Go to bed and get up at the same time every day.  Avoid fatigue by pacing yourself during the day and getting enough sleep at night.  Maintain a healthy weight. Medicines  Take over-the-counter and prescription medicines only as told by your health care  provider.  Take a multivitamin, if told by your health care provider.  Do not use herbal or dietary supplements unless they are approved by your health care provider. Activity   Exercise regularly, as told by your health care provider.  Use or practice techniques to help you relax, such as yoga, tai chi, meditation, or massage therapy. Eating and drinking   Avoid heavy meals in the evening.  Eat a well-balanced diet, which includes lean proteins, whole grains, plenty of fruits and vegetables, and low-fat dairy products.  Avoid consuming too much caffeine.  Avoid the use of alcohol.  Drink enough fluid to keep your urine pale yellow. Lifestyle  Change situations that cause you stress. Try to keep your work and personal schedule in balance.  Do not use any products that contain nicotine or tobacco, such as cigarettes and e-cigarettes. If you need help quitting, ask your health care provider.  Do not use drugs. Contact a health care provider if:  Your fatigue does not get better.  You have a fever.  You suddenly lose or gain weight.  You have headaches.  You have trouble falling asleep or sleeping through the night.  You feel angry, guilty, anxious, or sad.  You are unable to have a bowel movement (constipation).  Your skin is dry.  You have swelling in your legs or another part of your body. Get help right away if:  You feel confused.  Your vision is blurry.  You feel faint or you pass out.  You have a severe headache.  You have severe pain in your abdomen, your back, or the area between your waist and hips (pelvis).  You have chest pain, shortness of breath, or an irregular or fast heartbeat.  You are unable to urinate, or you urinate less than normal.  You have abnormal bleeding, such as bleeding from the rectum, vagina, nose, lungs, or nipples.  You vomit blood.  You have thoughts  about hurting yourself or others. If you ever feel like you may  hurt yourself or others, or have thoughts about taking your own life, get help right away. You can go to your nearest emergency department or call:  Your local emergency services (911 in the [place]  A suicide crisis helpline, such as the [facility] at 416-035-8083. This is open 24 hours a day. Summary  If you have fatigue, you feel tired all the time and have a lack of energy or a lack of motivation.  Fatigue may make it difficult to start or complete tasks because of exhaustion.  Long-lasting (chronic) or extreme fatigue may be a symptom of a medical condition.  Exercise regularly, as told by your health care provider.  Change situations that cause you stress. Try to keep your work and personal schedule in balance. This information is not intended to replace advice given to you by your health care provider. Make sure you discuss any questions you have with your health care provider. Document Revised: 07/31/2018 Document Reviewed: 10/04/2016 Elsevier Patient Education  2020 [employer].

## 2019-03-20 ENCOUNTER — Encounter: Payer: Self-pay | Admitting: Emergency Medicine

## 2019-03-20 LAB — CBC WITH DIFFERENTIAL/PLATELET
Basophils Absolute: 0.1 10*3/uL (ref 0.0–0.2)
Basos: 1 %
EOS (ABSOLUTE): 0.1 10*3/uL (ref 0.0–0.4)
Eos: 2 %
Hematocrit: 46.5 % (ref 37.5–51.0)
Hemoglobin: 15.8 g/dL (ref 13.0–17.7)
Immature Grans (Abs): 0 10*3/uL (ref 0.0–0.1)
Immature Granulocytes: 0 %
Lymphocytes Absolute: 1.7 10*3/uL (ref 0.7–3.1)
Lymphs: 31 %
MCH: 31.9 pg (ref 26.6–33.0)
MCHC: 34 g/dL (ref 31.5–35.7)
MCV: 94 fL (ref 79–97)
Monocytes Absolute: 0.5 10*3/uL (ref 0.1–0.9)
Monocytes: 10 %
Neutrophils Absolute: 2.9 10*3/uL (ref 1.4–7.0)
Neutrophils: 56 %
Platelets: 188 10*3/uL (ref 150–450)
RBC: 4.96 x10E6/uL (ref 4.14–5.80)
RDW: 12.3 % (ref 11.6–15.4)
WBC: 5.3 10*3/uL (ref 3.4–10.8)

## 2019-03-20 LAB — LIPID PANEL
Chol/HDL Ratio: 3.6 ratio (ref 0.0–5.0)
Cholesterol, Total: 164 mg/dL (ref 100–199)
HDL: 45 mg/dL (ref >39)
LDL Chol Calc (NIH): 92 mg/dL (ref 0–99)
Triglycerides: 158 mg/dL — ABNORMAL HIGH (ref 0–149)
VLDL Cholesterol Cal: 27 mg/dL (ref 5–40)

## 2019-03-20 LAB — COMPREHENSIVE METABOLIC PANEL
ALT: 28 IU/L (ref 0–44)
AST: 26 IU/L (ref 0–40)
Albumin/Globulin Ratio: 1.8 (ref 1.2–2.2)
Albumin: 4.5 g/dL (ref 3.7–4.7)
Alkaline Phosphatase: 95 IU/L (ref 39–117)
BUN/Creatinine Ratio: 8 — ABNORMAL LOW (ref 10–24)
BUN: 8 mg/dL (ref 8–27)
Bilirubin Total: 0.8 mg/dL (ref 0.0–1.2)
CO2: 20 mmol/L (ref 20–29)
Calcium: 9.6 mg/dL (ref 8.6–10.2)
Chloride: 99 mmol/L (ref 96–106)
Creatinine, Ser: 1.04 mg/dL (ref 0.76–1.27)
GFR calc Af Amer: 81 mL/min/{1.73_m2} (ref >59)
GFR calc non Af Amer: 70 mL/min/{1.73_m2} (ref >59)
Globulin, Total: 2.5 g/dL (ref 1.5–4.5)
Glucose: 88 mg/dL (ref 65–99)
Potassium: 4.4 mmol/L (ref 3.5–5.2)
Sodium: 137 mmol/L (ref 134–144)
Total Protein: 7 g/dL (ref 6.0–8.5)

## 2019-03-20 LAB — TSH: TSH: 1.29 u[IU]/mL (ref 0.450–4.500)

## 2019-03-24 ENCOUNTER — Ambulatory Visit: Payer: 59 | Attending: Internal Medicine

## 2019-03-24 DIAGNOSIS — Z23 Encounter for immunization: Secondary | ICD-10-CM

## 2019-03-24 NOTE — Progress Notes (Signed)
   Covid-19 Vaccination Clinic  Name:  Oscar Smith    MRN: [id number] DOB: 09-28-1945  03/24/2019  Mr. Oscar Smith was observed post Covid-19 immunization for 15 minutes without incidence. He was provided with Vaccine Information Sheet and instruction to access the V-Safe system.   Mr. Oscar Smith was instructed to call 911 with any severe reactions post vaccine: Marland Kitchen Difficulty breathing  . Swelling of your face and throat  . A fast heartbeat  . A bad rash all over your body  . Dizziness and weakness    Immunizations Administered    Name Date Dose VIS Date Route   Pfizer COVID-19 Vaccine 03/24/2019 10:05 AM 0.3 mL 01/03/2019 Intramuscular   Manufacturer: [place]   Lot: [id number]   [place]: [id number]

## 2019-05-05 ENCOUNTER — Encounter (HOSPITAL_COMMUNITY): Payer: Self-pay | Admitting: *Deleted

## 2019-05-05 ENCOUNTER — Ambulatory Visit (INDEPENDENT_AMBULATORY_CARE_PROVIDER_SITE_OTHER): Payer: Medicare Other | Admitting: Family Medicine

## 2019-05-05 ENCOUNTER — Encounter: Payer: Self-pay | Admitting: Family Medicine

## 2019-05-05 ENCOUNTER — Other Ambulatory Visit: Payer: Self-pay

## 2019-05-05 ENCOUNTER — Emergency Department (HOSPITAL_COMMUNITY)
Admission: EM | Admit: 2019-05-05 | Discharge: 2019-05-06 | Disposition: A | Discharge status: Home / Self Care | Payer: Medicare Other | Attending: Emergency Medicine | Admitting: Emergency Medicine

## 2019-05-05 VITALS — BP 150/88 | HR 63 | Temp 98.4°F | Ht 70.0 in | Wt 189.0 lb

## 2019-05-05 DIAGNOSIS — R42 Dizziness and giddiness: Secondary | ICD-10-CM | POA: Diagnosis not present

## 2019-05-05 DIAGNOSIS — G3281 Cerebellar ataxia in diseases classified elsewhere: Secondary | ICD-10-CM

## 2019-05-05 DIAGNOSIS — I10 Essential (primary) hypertension: Secondary | ICD-10-CM | POA: Insufficient documentation

## 2019-05-05 DIAGNOSIS — R002 Palpitations: Secondary | ICD-10-CM

## 2019-05-05 DIAGNOSIS — R55 Syncope and collapse: Secondary | ICD-10-CM | POA: Diagnosis not present

## 2019-05-05 DIAGNOSIS — Z8546 Personal history of malignant neoplasm of prostate: Secondary | ICD-10-CM | POA: Insufficient documentation

## 2019-05-05 DIAGNOSIS — I447 Left bundle-branch block, unspecified: Secondary | ICD-10-CM

## 2019-05-05 DIAGNOSIS — Z7982 Long term (current) use of aspirin: Secondary | ICD-10-CM | POA: Diagnosis not present

## 2019-05-05 DIAGNOSIS — Z91040 Latex allergy status: Secondary | ICD-10-CM | POA: Diagnosis not present

## 2019-05-05 DIAGNOSIS — R29818 Other symptoms and signs involving the nervous system: Secondary | ICD-10-CM

## 2019-05-05 LAB — POCT CBC
Granulocyte percent: 63.4 %G (ref 37–80)
HCT, POC: 43.9 % — AB (ref 29–41)
Hemoglobin: 14.4 g/dL (ref 11–14.6)
Lymph, poc: 1.7 (ref 0.6–3.4)
MCH, POC: 31.5 pg — AB (ref 27–31.2)
MCHC: 32.8 g/dL (ref 31.8–35.4)
MCV: 95.9 fL (ref 76–111)
MID (cbc): 0.2 (ref 0–0.9)
MPV: 6.9 fL (ref 0–99.8)
POC Granulocyte: 3.4 (ref 2–6.9)
POC LYMPH PERCENT: 32.9 %L (ref 10–50)
POC MID %: 3.7 %M (ref 0–12)
Platelet Count, POC: 197 10*3/uL (ref 142–424)
RBC: 4.58 M/uL — AB (ref 4.69–6.13)
RDW, POC: 13.2 %
WBC: 5.3 10*3/uL (ref 4.6–10.2)

## 2019-05-05 LAB — BASIC METABOLIC PANEL
Anion gap: 9 (ref 5–15)
BUN: 8 mg/dL (ref 8–23)
CO2: 25 mmol/L (ref 22–32)
Calcium: 9.2 mg/dL (ref 8.9–10.3)
Chloride: 100 mmol/L (ref 98–111)
Creatinine, Ser: 0.88 mg/dL (ref 0.61–1.24)
GFR calc Af Amer: >60 mL/min (ref >60)
GFR calc non Af Amer: >60 mL/min (ref >60)
Glucose, Bld: 141 mg/dL — ABNORMAL HIGH (ref 70–99)
Potassium: 3.9 mmol/L (ref 3.5–5.1)
Sodium: 134 mmol/L — ABNORMAL LOW (ref 135–145)

## 2019-05-05 LAB — CBC
HCT: 43.5 % (ref 39.0–52.0)
Hemoglobin: 14.7 g/dL (ref 13.0–17.0)
MCH: 31.1 pg (ref 26.0–34.0)
MCHC: 33.8 g/dL (ref 30.0–36.0)
MCV: 92.2 fL (ref 80.0–100.0)
Platelets: 201 10*3/uL (ref 150–400)
RBC: 4.72 MIL/uL (ref 4.22–5.81)
RDW: 12.4 % (ref 11.5–15.5)
WBC: 6.6 10*3/uL (ref 4.0–10.5)
nRBC: 0 % (ref 0.0–0.2)

## 2019-05-05 LAB — URINALYSIS, ROUTINE W REFLEX MICROSCOPIC
Bilirubin Urine: NEGATIVE
Glucose, UA: NEGATIVE mg/dL
Hgb urine dipstick: NEGATIVE
Ketones, ur: NEGATIVE mg/dL
Leukocytes,Ua: NEGATIVE
Nitrite: NEGATIVE
Protein, ur: NEGATIVE mg/dL
Specific Gravity, Urine: 1.015 (ref 1.005–1.030)
pH: 6 (ref 5.0–8.0)

## 2019-05-05 LAB — GLUCOSE, POCT (MANUAL RESULT ENTRY): POC Glucose: 91 mg/dl (ref 70–99)

## 2019-05-05 MED ORDER — SODIUM CHLORIDE 0.9% FLUSH
3.0000 mL | Freq: Once | INTRAVENOUS | Status: AC
  Administered 2019-05-06: 3 mL via INTRAVENOUS

## 2019-05-05 NOTE — ED Triage Notes (Signed)
C/o waking up with dizziness this am , states he has a similar episode 2 mnornings ago, states he was seen by his PCP this am and was told he had ekg changes. Denies chest pain

## 2019-05-05 NOTE — Patient Instructions (Addendum)
  Although the dizziness could be related to peripheral vertigo, I'm concerned about a new left bundle branch block on your EKG and the feeling of passing out.  Further evaluation will need to be done through the ER.  Head to [facility] emergency room after leaving the office.  If any acute changes on the way there, call 911.  If they would like you to follow-up with primary care after evaluation, I'm happy to see you.   If you have lab work done today you will be contacted with your lab results within the next 2 weeks.  If you have not heard from [place] then please contact us. The fastest way to get your results is to register for My Chart.   IF you received an x-ray today, you will receive an invoice from [facility]. Please contact [facility] at [phone] with questions or concerns regarding your invoice.   IF you received labwork today, you will receive an invoice from [place]. Please contact [facility] at [phone] with questions or concerns regarding your invoice.   Our billing staff will not be able to assist you with questions regarding bills from these companies.  You will be contacted with the lab results as soon as they are available. The fastest way to get your results is to activate your My Chart account. Instructions are located on the last page of this paperwork. If you have not heard from [place] regarding the results in 2 weeks, please contact this office.

## 2019-05-05 NOTE — Progress Notes (Signed)
Subjective:  Patient ID: Oscar Smith, male    DOB: 25-Aug-1945  Age: 74 y.o. MRN: [id number]  CC:  Chief Complaint  Patient presents with  . Dizziness    started yesterday. pt was/is dizzy pt reports the room spinning sinsation no nausa. pt states he has felt like he was going to pass-out, but accutaly hasn't. no ear pressure. pt states he feels his heart rascing at night. pt was exserting himself yesterday before symptoms arised    HPI Oscar Smith presents for   Dizziness: Felt dizzy/vertigo feeling when getting up out of bed yesterday, lasted few mins. No associated palpitations, vision change, speech difficulty or weakness. No n/v. Resolved in 30 mins.    Noted again this am when sat up, room spinning. Worse than yesterday - felt like he was going to pass out/near syncope.   Occasional heart racing at night before going to sleep for some time - hits chest to make it go away. Denies sx's this am. HR 56, BP 145/84 when lying down. 156/97 sitting up. No recent missed doses of BP meds. No HA, weakness or chest pain.  persitent room spinning sensation - especially with quick head mvmt.  No known hx of vertigo.  No recent change in sinus issues.  Tx: none.    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 Not sure if drinking enough water.  Medical history reviewed.  Hypertension, prostate cancer, chronic sinusitis.  History Patient Active Problem List   Diagnosis Date Noted  . Essential hypertension 07/03/2018  . Pure hypercholesterolemia 07/03/2018  . Osteoarthritis of right hip 02/07/2018  . Lumbar radiculopathy 01/24/2017  . Enlarged prostate with urinary retention 12/25/2013  . Erectile dysfunction 12/25/2013  . Incomplete bladder emptying 08/27/2013  . Chronic sinusitis 02/20/2012  . Frontal mucocele 02/20/2012  . Nasal septal deviation 02/20/2012  . ADENOCARCINOMA, PROSTATE, GLEASON GRADE 6 12/21/2009  . GERD 11/12/2008   Past Medical History:    Diagnosis Date  . Adenocarcinoma of prostate [facility]) urologist-  dr Tresa Moore--  dx Nov 2011--  T1c, Gleason 3+3,  PSA 4.2 (post IMRT 03/2010   Low risk recurrence, Biochemical control,  last PSA 0.30 on 05-28-2014  . BPH (benign prostatic hyperplasia)   . Cancer ([place])   . Diverticulitis   . ED (erectile dysfunction) of organic origin   . GERD (gastroesophageal reflux disease)   . Hiatal hernia   . History of colon polyps    2002 and 2003 hyperplastic  . History of gastritis   . Hypertension   . Lower urinary tract symptoms (LUTS)   . Phimosis   . Sleep apnea    did not complete study.    . Wears hearing aid    bilateral   Past Surgical History:  Procedure Laterality Date  . ABDOMINAL HERNIA REPAIR    . CIRCUMCISION N/A 06/09/2015   Procedure: CIRCUMCISION ADULT WITH PENILE BLOCK;  Surgeon: Alexis Frock, MD;  Location: [facility];  Service: Urology;  Laterality: N/A;  . COLONOSCOPY  last one 11-15-2011  . ENDOVENOUS ABLATION SAPHENOUS VEIN W/ LASER Left 02/10/2016   endovenous laser ablation left greater saphenous vein and stab phlebectomy left leg by Curt Jews MD  . INGUINAL HERNIA REPAIR Right 12-23-2009  . left shoulder surgery    . LIPOMA EXCISION N/A 12/01/2016   Procedure: EXCISION POSTERIOR NECK MASS;  Surgeon: Irene Limbo, MD;  Location: [place];  Service: Plastics;  Laterality: N/A;  . PROSTATE  SURGERY  2009  . PROSTATE SURGERY    . SHOULDER ARTHROSCOPY WITH SUBACROMIAL DECOMPRESSION, ROTATOR CUFF REPAIR AND BICEP TENDON REPAIR Left 10-2014   Allergies  Allergen Reactions  . Flomax [Tamsulosin Hcl]     "bad reaction" jaw pain   Prior to Admission medications   Medication Sig Start Date End Date Taking? Authorizing Provider  aspirin EC 81 MG tablet Take 81 mg daily by mouth.   Yes [provider]  atorvastatin (LIPITOR) 10 MG tablet TAKE 1 TABLET ONCE DAILY. 02/06/19  Yes Sagardia, Ines Bloomer, MD  fluticasone [facility])  50 MCG/ACT nasal spray Place 2 sprays into both nostrils daily. 09/03/18  Yes Sagardia, Ines Bloomer, MD  ipratropium (ATROVENT) 0.03 % nasal spray Place 2 sprays into both nostrils 2 (two) times daily. 06/25/17  Yes Wardell Honour, MD  lisinopril (ZESTRIL) 40 MG tablet Take 1 tablet (40 mg total) by mouth daily. 11/11/18  Yes Sagardia, Ines Bloomer, MD  Multiple Vitamin (MULTIVITAMIN) tablet Take 1 tablet by mouth daily.   Yes [provider]  neomycin-polymyxin-dexamethasone (MAXITROL) 0.1 % ophthalmic suspension neomycin-polymyxin-dexameth 3.5 mg/mL-10,000 unit/mL-0.1% eye drops   Yes [provider]  Omega-3 Fatty Acids (FISH OIL OMEGA-3 PO) Take by mouth.   Yes [provider]  omeprazole (PRILOSEC) 20 MG capsule TAKE 1 CAPSULE 15-20 MINUTES BEFORE BREAKFAST. 02/06/19  Yes [place], Ines Bloomer, MD  OVER THE COUNTER MEDICATION OTC Ultraflora taking prn for IB   Yes [provider]  methylPREDNISolone (MEDROL DOSEPAK) 4 MG TBPK tablet As directed for 6 days. Patient not taking: Reported on 05/05/2019 12/02/18   Eunice Blase, MD   Social History   Socioeconomic History  . Marital status: Married    Spouse name: Not on file  . Number of children: 0  . Years of education: BS  . Highest education level: Not on file  Occupational History  . Occupation: Development worker, community  . Occupation: Chief Financial Officer  Tobacco Use  . Smoking status: Former Smoker    Types: Cigarettes    Quit date: 09/04/1965    Years since quitting: 53.7  . Smokeless tobacco: Never Used  . Tobacco comment: quit around 74 yrs old.  Substance and Sexual Activity  . Alcohol use: Yes    Alcohol/week: 20.0 standard drinks    Types: 20 Standard drinks or equivalent per week    Comment: nightly  . Drug use: No  . Sexual activity: Yes    Partners: Male    Birth control/protection: Post-menopausal  Other Topics Concern  . Not on file  Social History Narrative   ** Merged History Encounter **        Originally from [place].   Trained as an Chief Financial Officer.    Married '75- 81 years, divorced; Married '86. No children.    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 Sculptor-life size figures    by commission (see work in [place]  Sculpting work regularly.   Never smoking.   Alcohol: drinks1- 2 beers and 1-2 wines every night;    Learning the piano. Patient get regular exercise.   Wife is a Marine scientist.   Caffeine: 3/day    ADLs: drives; independent with ADLs   Advanced Directives: YES; FULL CODE; no prolonged measures.        Social Determinants of Health   Financial Resource Strain:   . Difficulty of Paying Living Expenses:   Food Insecurity:   . Worried About Running  Out of Food in the Last Year:   . [place] in the Last Year:   Transportation Needs:   . Lack of Transportation (Medical):   Marland Kitchen Lack of Transportation (Non-Medical):   Physical Activity:   . Days of Exercise per Week:   . Minutes of Exercise per Session:   Stress:   . Feeling of Stress :   Social Connections:   . Frequency of Communication with Friends and Family:   . Frequency of Social Gatherings with Friends and Family:   . Attends Religious Services:   . Active Member of Clubs or Organizations:   . Attends Archivist Meetings:   Marland Kitchen Marital Status:   Intimate Partner Violence:   . Fear of Current or Ex-Partner:   . Emotionally Abused:   Marland Kitchen Physically Abused:   . Sexually Abused:     Review of Systems Per HPI.   Objective:   Vitals:   05/05/19 1043 05/05/19 1046  BP: (!) 167/93 (!) 150/88  Pulse: 63   Temp: 98.4 F (36.9 C)   TempSrc: Temporal   SpO2: 98%   Weight: 189 lb (85.7 kg)   Height: 5\' 10"  (1.778 m)      Physical Exam Vitals reviewed.  Constitutional:      Appearance: He is well-developed.  HENT:     Head: Normocephalic and atraumatic.  Eyes:     General: No visual field deficit.     Extraocular Movements:     Right eye: Nystagmus (2 beats horizontal, unchanged with sitting minimal change with supine to sitting, and vice versa.  No vertical nystagmus.  Unchanged with sitting/supine changes.) present.     Left eye: Nystagmus present.     Pupils: Pupils are equal, round, and reactive to light.     Comments: Did report intermittent diplopia with initial sitting up from supine position, resolved.  Neck:     Vascular: No carotid bruit or JVD.  Cardiovascular:     Rate and Rhythm: Normal rate and regular rhythm.     Heart sounds: No murmur. Gallop (possible faint gallop at LUSB) present.   Pulmonary:     Effort: Pulmonary effort is normal.     Breath sounds: Normal breath sounds. No rales.  Skin:    General: Skin is warm and dry.  Neurological:     Mental Status: He is alert and oriented to person, place, and time.     GCS: GCS eye subscore is 4. GCS verbal subscore is 5. GCS motor subscore is 6.     Cranial Nerves: No cranial nerve deficit, dysarthria or facial asymmetry.     Sensory: No sensory deficit.     Motor: No weakness, tremor, abnormal muscle tone, seizure activity or pronator drift.     Comments: Slight wide-based stance, unsteady with Romberg but corrects.     EKG: SR, rate 60. New left bundle branch block, with left axis compared to 10/04/16.   Denies any history of heart disease, or recent chest pains.  Still not feeling back to normal - more or less dizzy.   Orthostatic VS for the past 24 hrs:  BP- Lying Pulse- Lying BP- Sitting Pulse- Sitting BP- Standing at 0 minutes Pulse- Standing at 0 minutes  05/05/19 1221 154/88 60 146/82 63 148/90 70   Results for orders placed or performed in visit on 05/05/19  POCT CBC  Result Value Ref Range   WBC 5.3 4.6 - 10.2 K/uL   Lymph, poc 1.7  0.6 - 3.4   POC LYMPH PERCENT 32.9 10 - 50 %L   MID (cbc) 0.2 0 - 0.9   POC MID % 3.7 0 - 12 %M   POC Granulocyte 3.4 2 - 6.9   Granulocyte percent 63.4 37 - 80 %G   RBC  4.58 (A) 4.69 - 6.13 M/uL   Hemoglobin 14.4 11 - 14.6 g/dL   HCT, POC 43.9 (A) 29 - 41 %   MCV 95.9 76 - 111 fL   MCH, POC 31.5 (A) 27 - 31.2 pg   MCHC 32.8 31.8 - 35.4 g/dL   RDW, POC 13.2 %   Platelet Count, POC 197 142 - 424 K/uL   MPV 6.9 0 - 99.8 fL  POCT glucose (manual entry)  Result Value Ref Range   POC Glucose 91 70 - 99 mg/dl   Over 40 min   Assessment & Plan:  Oscar Smith is a 74 y.o. male . Vertigo  Near syncope  Intermittent palpitations - Plan: TSH, EKG 12-Lead  Essential hypertension - Plan: Orthostatic vital signs, Basic metabolic panel  Dizziness - Plan: Orthostatic vital signs, POCT CBC, POCT glucose (manual entry), Basic metabolic panel, TSH  Cerebellar ataxia in diseases classified elsewhere [facility])  Other symptoms and signs involving the nervous system  Left bundle branch block   Recent onset vertigo with reports of near syncope, still intermittent dizziness/vertiginous symptoms today.  Intermittent palpitations for some time, denies chest pain.  Exam suspicious for peripheral vertigo, but he did have few episodes of diplopia with testing, not persistent.  Also appears to have new left bundle branch block, possible faint gallop on exam.  -Recommended further evaluation through [facility] emergency room given EKG changes, intermittent diplopia on exam, may need neuro imaging to look for posterior circulation, further cardiac evaluation although denies active chest pain or palpitations at this time. Will proceed by private vehicle. 911 precautions. Advised nurse first at [facility] ER.    No orders of the defined types were placed in this encounter.  Patient Instructions    Although the dizziness could be related to peripheral vertigo, I'm concerned about a new left bundle branch block on your EKG and the feeling of passing out.  Further evaluation will need to be done through the ER.  Head to [facility] emergency room after leaving the office.  If any  acute changes on the way there, call 911.  If they would like you to follow-up with primary care after evaluation, I'm happy to see you.   If you have lab work done today you will be contacted with your lab results within the next 2 weeks.  If you have not heard from [place] then please contact us. The fastest way to get your results is to register for My Chart.   IF you received an x-ray today, you will receive an invoice from [facility]. Please contact [facility] at [phone] with questions or concerns regarding your invoice.   IF you received labwork today, you will receive an invoice from [facility]. Please contact [facility] at [phone] with questions or concerns regarding your invoice.   Our billing staff will not be able to assist you with questions regarding bills from these companies.  You will be contacted with the lab results as soon as they are available. The fastest way to get your results is to activate your My Chart account. Instructions are located on the last page of this paperwork. If you have not heard  from [place] regarding the results in 2 weeks, please contact this office.         Signed, Merri Ray, MD [facility]

## 2019-05-06 ENCOUNTER — Emergency Department (HOSPITAL_COMMUNITY): Payer: Medicare Other

## 2019-05-06 ENCOUNTER — Telehealth: Payer: Self-pay | Admitting: Family Medicine

## 2019-05-06 DIAGNOSIS — R42 Dizziness and giddiness: Secondary | ICD-10-CM | POA: Diagnosis not present

## 2019-05-06 LAB — BASIC METABOLIC PANEL
BUN/Creatinine Ratio: 10 (ref 10–24)
BUN: 9 mg/dL (ref 8–27)
CO2: 20 mmol/L (ref 20–29)
Calcium: 9.5 mg/dL (ref 8.6–10.2)
Chloride: 98 mmol/L (ref 96–106)
Creatinine, Ser: 0.86 mg/dL (ref 0.76–1.27)
GFR calc Af Amer: 98 mL/min/{1.73_m2} (ref >59)
GFR calc non Af Amer: 85 mL/min/{1.73_m2} (ref >59)
Glucose: 98 mg/dL (ref 65–99)
Potassium: 4.2 mmol/L (ref 3.5–5.2)
Sodium: 136 mmol/L (ref 134–144)

## 2019-05-06 LAB — TSH: TSH: 0.872 u[IU]/mL (ref 0.450–4.500)

## 2019-05-06 LAB — TROPONIN I (HIGH SENSITIVITY)
Troponin I (High Sensitivity): 11 ng/L (ref <18)
Troponin I (High Sensitivity): 19 ng/L — ABNORMAL HIGH (ref <18)
Troponin I (High Sensitivity): 20 ng/L — ABNORMAL HIGH (ref <18)

## 2019-05-06 MED ORDER — MECLIZINE HCL 25 MG PO TABS: 25.0000 mg | ORAL_TABLET | Freq: Three times a day (TID) | ORAL | 0 refills | Status: DC | PRN

## 2019-05-06 MED ORDER — LORAZEPAM 1 MG PO TABS
0.5000 mg | ORAL_TABLET | ORAL | Status: AC | PRN
  Administered 2019-05-06: 0.5 mg via ORAL
  Filled 2019-05-06: qty 1

## 2019-05-06 MED ORDER — SODIUM CHLORIDE 0.9 % IV BOLUS
500.0000 mL | Freq: Once | INTRAVENOUS | Status: AC
  Administered 2019-05-06: 500 mL via INTRAVENOUS

## 2019-05-06 NOTE — Telephone Encounter (Signed)
REFERRAL REQUEST Telephone Note  What type of referral do you need? Cardiologist   Have you been seen at our office for this problem? Yes , Dr. Carlota Raspberry had the patient go to the ED although Wife wanted to see if he could just go to a cardiologist instead (Advise that they will likely need an appointment with their PCP before a referral can be done)  Is there a particular doctor or location that you prefer?  no  Patient notified that referrals can take up to a week or longer to process. If they haven't heard anything within a week they should call back and speak with the referral department.    Patient Wife called after hours and said that they had spent the whole day at the ED after seeing Dr.Greene and had nothing but lab work and EKG done.

## 2019-05-06 NOTE — Telephone Encounter (Signed)
Pt requesting a referral to cardio. I do not see where this has been discussed with the pt by you and I do see that they have been [place] as well. So, not sure who to send this to too address for the referral. Let me know whether I need to send this to [place].  Please Advise.

## 2019-05-06 NOTE — Discharge Instructions (Addendum)
The cardiology clinic should call you in the next 24 hours to set up a follow up appointment.  If you do not hear from them by tomorrow morning, you can call the heart care number above.  Tell them that you were seen in the ER, and the doctor spoke to Dr. Harrell Gave (a cardiologist), who recommended expedited office follow up.  We talked about your work-up in the ER today.  We discussed your ECG and your cardiac enzyme levels, which were very mildly elevated.  I did recommend rapid outpatient cardiology follow up.  Given that you were not having any chest pain, I felt it was reasonable to discharge you home.  If you begin having chest pain, chest pressure, shortness of breath, or significant lightheadedness, return to the ER immediately.

## 2019-05-06 NOTE — ED Notes (Signed)
Pt transported to MRI 

## 2019-05-06 NOTE — ED Provider Notes (Signed)
Clinical Course as of May 05 809  Tue May 06, 2019  0810 I had a discussion with Dr. Harrell Gave of cardiology on the phone.  Reviewed the patient's EKG and his troponin levels.  I reassessed the patient at home he does not have any chest pain, and did not previously, nor any SOB.  With myself and cardiologist are doubtful that this isolated vertigo is related to his heart.  We do recommend that he follows up with a cardiologist this week if possible.  Dr. Harrell Gave says she reach out to her office about getting set up with expedited care.  He may need a stress test or an echocardiogram to evaluate his heart for the new left bundle branch block, new since 2018.  If he develops CP he should return to the ER.  This was discussed with the patient and his wife who verbalized understanding and were happy with this plan.   [MT]    Clinical Course User Index [MT] Angeliz Settlemyre, Carola Rhine, MD      Wyvonnia Dusky, MD 05/06/19 [id number]

## 2019-05-06 NOTE — ED Notes (Signed)
Pt returned from MRI, pt placed back on cardiac monitor.

## 2019-05-06 NOTE — ED Provider Notes (Signed)
[facility] EMERGENCY DEPARTMENT Provider Note   CSN: [id number] Arrival date & time: 05/05/19  1330     History Chief Complaint  Patient presents with  . Dizziness    Oscar Smith is a 74 y.o. male.  Patient was referred to the emergency department by his primary care doctor earlier today.  He has been experiencing dizziness today.  PCP felt that this was consistent with vertigo.  During his work-up, however, he was found to have a left bundle branch block on EKG.  This is new since 2018.  Patient denies chest pain, shortness of breath, heart palpitations.  Patient reports that his dizziness is improved.  He still feels some sense of spinning when he gets up from lying down but when he is stationary there are no symptoms.        Past Medical History:  Diagnosis Date  . Adenocarcinoma of prostate [facility]) urologist-  dr Tresa Moore--  dx Nov 2011--  T1c, Gleason 3+3,  PSA 4.2 (post IMRT 03/2010   Low risk recurrence, Biochemical control,  last PSA 0.30 on 05-28-2014  . BPH (benign prostatic hyperplasia)   . Cancer ([place])   . Diverticulitis   . ED (erectile dysfunction) of organic origin   . GERD (gastroesophageal reflux disease)   . Hiatal hernia   . History of colon polyps    2002 and 2003 hyperplastic  . History of gastritis   . Hypertension   . Lower urinary tract symptoms (LUTS)   . Phimosis   . Sleep apnea    did not complete study.    . Wears hearing aid    bilateral    Patient Active Problem List   Diagnosis Date Noted  . Essential hypertension 07/03/2018  . Pure hypercholesterolemia 07/03/2018  . Osteoarthritis of right hip 02/07/2018  . Lumbar radiculopathy 01/24/2017  . Enlarged prostate with urinary retention 12/25/2013  . Erectile dysfunction 12/25/2013  . Incomplete bladder emptying 08/27/2013  . Chronic sinusitis 02/20/2012  . Frontal mucocele 02/20/2012  . Nasal septal deviation 02/20/2012  . ADENOCARCINOMA, PROSTATE, GLEASON  GRADE 6 12/21/2009  . GERD 11/12/2008    Past Surgical History:  Procedure Laterality Date  . ABDOMINAL HERNIA REPAIR    . CIRCUMCISION N/A 06/09/2015   Procedure: CIRCUMCISION ADULT WITH PENILE BLOCK;  Surgeon: Alexis Frock, MD;  Location: [facility];  Service: Urology;  Laterality: N/A;  . COLONOSCOPY  last one 11-15-2011  . ENDOVENOUS ABLATION SAPHENOUS VEIN W/ LASER Left 02/10/2016   endovenous laser ablation left greater saphenous vein and stab phlebectomy left leg by Curt Jews MD  . INGUINAL HERNIA REPAIR Right 12-23-2009  . left shoulder surgery    . LIPOMA EXCISION N/A 12/01/2016   Procedure: EXCISION POSTERIOR NECK MASS;  Surgeon: Irene Limbo, MD;  Location: [facility];  Service: Plastics;  Laterality: N/A;  . PROSTATE SURGERY  2009  . PROSTATE SURGERY    . SHOULDER ARTHROSCOPY WITH SUBACROMIAL DECOMPRESSION, ROTATOR CUFF REPAIR AND BICEP TENDON REPAIR Left 10-2014       Family History  Problem Relation Age of Onset  . Stomach cancer Father   . Cancer Father 63       stomach versus colon cancer  . Pancreatic cancer Sister   . Cancer Sister 106       pancreatic cancer  . Cancer Brother        penile cancer    Social History   Tobacco Use  . Smoking  status: Former Smoker    Types: Cigarettes    Quit date: 09/04/1965    Years since quitting: 53.7  . Smokeless tobacco: Never Used  . Tobacco comment: quit around 74 yrs old.  Substance Use Topics  . Alcohol use: Yes    Alcohol/week: 20.0 standard drinks    Types: 20 Standard drinks or equivalent per week    Comment: nightly  . Drug use: No    Home Medications Prior to Admission medications   Medication Sig Start Date End Date Taking? Authorizing Provider  aspirin EC 81 MG tablet Take 81 mg daily by mouth.   Yes [provider]  diphenhydramine-acetaminophen (TYLENOL PM) 25-500 MG TABS tablet Take 1 tablet by mouth at bedtime as needed (for sleep).   Yes [provider]  fluticasone (FLONASE) 50 MCG/ACT nasal spray Place 2 sprays into both nostrils daily. 09/03/18  Yes Sagardia, Ines Bloomer, MD  lisinopril (ZESTRIL) 40 MG tablet Take 1 tablet (40 mg total) by mouth daily. 11/11/18  Yes Sagardia, Ines Bloomer, MD  Multiple Vitamin (MULTIVITAMIN) tablet Take 1 tablet by mouth daily.   Yes [provider]  neomycin-polymyxin-dexamethasone (MAXITROL) 0.1 % ophthalmic suspension 1 drop See admin instructions. Apply to affected eyelid as directed for 2-3 days for infection   Yes [provider]  NON FORMULARY Take 1 capsule by mouth See admin instructions. Metagenics UltraFlora IB Daily Probiotic, Targeted Relief capsules- Take 1 capsule by mouth once a day as needed for diarrhea symptoms   Yes [provider]  Omega-3 Fatty Acids (FISH OIL OMEGA-3 PO) Take 1 capsule by mouth daily.    Yes [provider]  atorvastatin (LIPITOR) 10 MG tablet TAKE 1 TABLET ONCE DAILY. 05/09/19   Horald Pollen, MD  ipratropium (ATROVENT) 0.03 % nasal spray Place 2 sprays into both nostrils 2 (two) times daily. 06/25/17   Wardell Honour, MD  meclizine (ANTIVERT) 25 MG tablet Take 1 tablet (25 mg total) by mouth 3 (three) times daily as needed for up to 25 doses for dizziness. 05/06/19   Wyvonnia Dusky, MD  methylPREDNISolone (MEDROL DOSEPAK) 4 MG TBPK tablet As directed for 6 days. 12/02/18   Hilts, Legrand Como, MD  omeprazole (PRILOSEC) 20 MG capsule TAKE 1 CAPSULE 15-20 MINUTES BEFORE BREAKFAST. 05/09/19   Horald Pollen, MD    Allergies    Flomax [tamsulosin hcl] and Latex  Review of Systems   Review of Systems  Neurological: Positive for dizziness.  All other systems reviewed and are negative.   Physical Exam Updated Vital Signs BP 134/82   Pulse (!) 58   Temp 99.1 F (37.3 C) (Oral)   Resp 19   Ht 5\' 10"  (1.778 m)   Wt 83.9 kg   SpO2 100%   BMI 26.54 kg/m   Physical Exam Vitals and nursing note reviewed.    Constitutional:      General: He is not in acute distress.    Appearance: Normal appearance. He is well-developed.  HENT:     Head: Normocephalic and atraumatic.     Right Ear: Hearing normal.     Left Ear: Hearing normal.     Nose: Nose normal.  Eyes:     Conjunctiva/sclera: Conjunctivae normal.     Pupils: Pupils are equal, round, and reactive to light.  Cardiovascular:     Rate and Rhythm: Regular rhythm.     Heart sounds: S1 normal and S2 normal. No murmur. No friction rub. No gallop.  Pulmonary:     Effort: Pulmonary effort is normal. No respiratory distress.     Breath sounds: Normal breath sounds.  Chest:     Chest wall: No tenderness.  Abdominal:     General: Bowel sounds are normal.     Palpations: Abdomen is soft.     Tenderness: There is no abdominal tenderness. There is no guarding or rebound. Negative signs include Murphy's sign and McBurney's sign.     Hernia: No hernia is present.  Musculoskeletal:        General: Normal range of motion.     Cervical back: Normal range of motion and neck supple.  Skin:    General: Skin is warm and dry.     Findings: No rash.  Neurological:     Mental Status: He is alert and oriented to person, place, and time.     GCS: GCS eye subscore is 4. GCS verbal subscore is 5. GCS motor subscore is 6.     Cranial Nerves: No cranial nerve deficit.     Sensory: No sensory deficit.     Coordination: Coordination normal.     Comments: Extraocular muscle movement: normal No visual field cut Pupils: equal and reactive both direct and consensual response is normal No nystagmus present    Sensory function is intact to light touch, pinprick Proprioception intact  Grip strength 5/5 symmetric in upper extremities No pronator drift Normal finger to nose bilaterally  Lower extremity strength 5/5 against gravity Normal heel to shin bilaterally     Psychiatric:        Speech: Speech normal.        Behavior: Behavior normal.         Thought Content: Thought content normal.     ED Results / Procedures / Treatments   Labs (all labs ordered are listed, but only abnormal results are displayed) Labs Reviewed  BASIC METABOLIC PANEL - Abnormal; Notable for the following components:      Result Value   Sodium 134 (*)    Glucose, Bld 141 (*)    All other components within normal limits  TROPONIN I (HIGH SENSITIVITY) - Abnormal; Notable for the following components:   Troponin I (High Sensitivity) 19 (*)    All other components within normal limits  TROPONIN I (HIGH SENSITIVITY) - Abnormal; Notable for the following components:   Troponin I (High Sensitivity) 20 (*)    All other components within normal limits  CBC  URINALYSIS, ROUTINE W REFLEX MICROSCOPIC  TROPONIN I (HIGH SENSITIVITY)    EKG EKG Interpretation  Date/Time:  Monday May 05 2019 14:10:54 EDT Ventricular Rate:  82 PR Interval:  228 QRS Duration: 166 QT Interval:  438 QTC Calculation: 511 R Axis:   2 Text Interpretation: Sinus rhythm with 1st degree A-V block Left bundle branch block Abnormal ECG No previous tracing Confirmed by Orpah Greek [phone]) on 05/06/2019 12:30:01 AM   Radiology No results found.  Procedures Procedures (including critical care time)  Medications Ordered in ED Medications  sodium chloride flush (NS) 0.9 % injection 3 mL (3 mLs Intravenous Given 05/06/19 0121)  LORazepam (ATIVAN) tablet 0.5 mg (0.5 mg Oral Given 05/06/19 0328)  sodium chloride 0.9 % bolus 500 mL (0 mLs Intravenous Stopped 05/06/19 0313)    ED Course  I have reviewed the triage vital signs and the nursing notes.  Pertinent labs & imaging results that were available during my care of the patient were reviewed by me and considered in  my medical decision making (see chart for details).  Clinical Course as of May 11 2346  Tue May 06, 2019  0810 I had a discussion with Dr. Harrell Gave of cardiology on the phone.  Reviewed the patient's EKG and  his troponin levels.  I reassessed the patient at home he does not have any chest pain, and did not previously, nor any SOB.  With myself and cardiologist are doubtful that this isolated vertigo is related to his heart.  We do recommend that he follows up with a cardiologist this week if possible.  Dr. Harrell Gave says she reach out to her office about getting set up with expedited care.  He may need a stress test or an echocardiogram to evaluate his heart for the new left bundle branch block, new since 2018.  If he develops CP he should return to the ER.  This was discussed with the patient and his wife who verbalized understanding and were happy with this plan.   [MT]    Clinical Course User Index [MT] Trifan, Carola Rhine, MD   MDM Rules/Calculators/A&P                      Patient presents to the emergency department for evaluation of dizziness.  Patient was sent to the emergency department by his primary doctor because he has EKG changes.  Patient had an EKG today that showed a left bundle branch which was new compared to his most recent EKG in 2018.  He is not experiencing any chest pain, heart palpitations, shortness of breath.  Significance of this bundle branch block is unclear at this time.  He does not have any cardiac symptoms coinciding with the dizziness and it does seem that his dizziness is very positional and likely peripheral in nature.  MRI does not show evidence of any acute abnormality.  Patient underwent serial troponins.  Second and third were slightly elevated.  Patient signed out to oncoming ER physician at this time to discuss care plan with cardiology.  Final Clinical Impression(s) / ED Diagnoses Final diagnoses:  Vertigo    Rx / DC Orders ED Discharge Orders         Ordered    meclizine (ANTIVERT) 25 MG tablet  3 times daily PRN     05/06/19 0814           Orpah Greek, MD 05/11/19 2349

## 2019-05-08 NOTE — Telephone Encounter (Signed)
See emergency room notes.  It appears he has an appointment tomorrow with cardiology.  Thanks

## 2019-05-09 ENCOUNTER — Other Ambulatory Visit: Payer: Self-pay | Admitting: Emergency Medicine

## 2019-05-09 ENCOUNTER — Ambulatory Visit: Payer: Medicare Other | Admitting: Cardiology

## 2019-05-09 ENCOUNTER — Other Ambulatory Visit: Payer: Self-pay

## 2019-05-09 ENCOUNTER — Encounter: Payer: Self-pay | Admitting: Cardiology

## 2019-05-09 VITALS — BP 140/90 | HR 64 | Temp 97.1°F | Ht 70.0 in | Wt 188.8 lb

## 2019-05-09 DIAGNOSIS — I10 Essential (primary) hypertension: Secondary | ICD-10-CM | POA: Diagnosis not present

## 2019-05-09 DIAGNOSIS — E785 Hyperlipidemia, unspecified: Secondary | ICD-10-CM

## 2019-05-09 DIAGNOSIS — I447 Left bundle-branch block, unspecified: Secondary | ICD-10-CM

## 2019-05-09 NOTE — Progress Notes (Signed)
Cardiology Office Note:    Date:  05/10/2019   ID:  Oscar Smith, DOB [birth year], MRN [id number]  PCP:  Oscar Pollen, MD  Cardiologist:  No primary care provider on file.  Electrophysiologist:  None   Referring MD: Oscar Smith, *   Chief Complaint  Patient presents with  . Abnormal ECG    History of Present Illness:    Oscar Smith is a 74 y.o. male with a hx of prostate cancer, hypertension who is referred by Dr. Mitchel Smith for evaluation of left bundle branch block.  He went to the ED on 05/06/2019 for dizziness.  Felt to be consistent with vertigo.  However work-up notable for new left bundle branch block.  High-sensitivity troponins 11->19->20.  Brought BP log with him.  BP running 140-160s/80-90s.  Over the weekend, was trying to lay patio stones.  Did a lot of heavy lifting.  Felt dizzy next day.  Room was spinning.  Lasted 5 minutes.  No chest pains.  Walks 3-4 times per week for about 25 minutes.  No chest pain, dyspnea, or syncope.  Does report intermittent episodes where it feels like heart is racing, occurs 1-2 times per month.  Can last minutes.    Past Medical History:  Diagnosis Date  . Adenocarcinoma of prostate [facility]) urologist-  dr Tresa Moore--  dx Nov 2011--  T1c, Gleason 3+3,  PSA 4.2 (post IMRT 03/2010   Low risk recurrence, Biochemical control,  last PSA 0.30 on 05-28-2014  . BPH (benign prostatic hyperplasia)   . Cancer ([place])   . Diverticulitis   . ED (erectile dysfunction) of organic origin   . GERD (gastroesophageal reflux disease)   . Hiatal hernia   . History of colon polyps    2002 and 2003 hyperplastic  . History of gastritis   . Hypertension   . Lower urinary tract symptoms (LUTS)   . Phimosis   . Sleep apnea    did not complete study.    . Wears hearing aid    bilateral    Past Surgical History:  Procedure Laterality Date  . ABDOMINAL HERNIA REPAIR    . CIRCUMCISION N/A 06/09/2015   Procedure: CIRCUMCISION  ADULT WITH PENILE BLOCK;  Surgeon: Alexis Frock, MD;  [facility]  Service: Urology;  Laterality: N/A;  . COLONOSCOPY  last one 11-15-2011  . ENDOVENOUS ABLATION SAPHENOUS VEIN W/ LASER Left 02/10/2016   endovenous laser ablation left greater saphenous vein and stab phlebectomy left leg by Curt Jews MD  . INGUINAL HERNIA REPAIR Right 12-23-2009  . left shoulder surgery    . LIPOMA EXCISION N/A 12/01/2016   Procedure: EXCISION POSTERIOR NECK MASS;  Surgeon: Irene Limbo, MD;  Location: [facility];  Service: Plastics;  Laterality: N/A;  . PROSTATE SURGERY  2009  . PROSTATE SURGERY    . SHOULDER ARTHROSCOPY WITH SUBACROMIAL DECOMPRESSION, ROTATOR CUFF REPAIR AND BICEP TENDON REPAIR Left 10-2014    Current Medications: Current Meds  Medication Sig  . aspirin EC 81 MG tablet Take 81 mg daily by mouth.  . diphenhydramine-acetaminophen (TYLENOL PM) 25-500 MG TABS tablet Take 1 tablet by mouth at bedtime as needed (for sleep).  . fluticasone (FLONASE) 50 MCG/ACT nasal spray Place 2 sprays into both nostrils daily.  Marland Kitchen ipratropium (ATROVENT) 0.03 % nasal spray Place 2 sprays into both nostrils 2 (two) times daily.  Marland Kitchen lisinopril (ZESTRIL) 40 MG tablet Take 1 tablet (40 mg total) by mouth daily.  Marland Kitchen  meclizine (ANTIVERT) 25 MG tablet Take 1 tablet (25 mg total) by mouth 3 (three) times daily as needed for up to 25 doses for dizziness.  . methylPREDNISolone (MEDROL DOSEPAK) 4 MG TBPK tablet As directed for 6 days.  . Multiple Vitamin (MULTIVITAMIN) tablet Take 1 tablet by mouth daily.  Marland Kitchen neomycin-polymyxin-dexamethasone (MAXITROL) 0.1 % ophthalmic suspension 1 drop See admin instructions. Apply to affected eyelid as directed for 2-3 days for infection  . NON FORMULARY Take 1 capsule by mouth See admin instructions. Metagenics UltraFlora IB Daily Probiotic, Targeted Relief capsules- Take 1 capsule by mouth once a day as needed for diarrhea symptoms  .  Omega-3 Fatty Acids (FISH OIL OMEGA-3 PO) Take 1 capsule by mouth daily.   . [DISCONTINUED] atorvastatin (LIPITOR) 10 MG tablet TAKE 1 TABLET ONCE DAILY. (Patient taking differently: Take 10 mg by mouth at bedtime. )  . [DISCONTINUED] omeprazole (PRILOSEC) 20 MG capsule TAKE 1 CAPSULE 15-20 MINUTES BEFORE BREAKFAST. (Patient taking differently: Take 20 mg by mouth daily before breakfast. )     Allergies:   Flomax [tamsulosin hcl] and Latex   Social History   Socioeconomic History  . Marital status: Married    Spouse name: Not on file  . Number of children: 0  . Years of education: BS  . Highest education level: Not on file  Occupational History  . Occupation: Development worker, community  . Occupation: Chief Financial Officer  Tobacco Use  . Smoking status: Former Smoker    Types: Cigarettes    Quit date: 09/04/1965    Years since quitting: 53.7  . Smokeless tobacco: Never Used  . Tobacco comment: quit around 74 yrs old.  Substance and Sexual Activity  . Alcohol use: Yes    Alcohol/week: 20.0 standard drinks    Types: 20 Standard drinks or equivalent per week    Comment: nightly  . Drug use: No  . Sexual activity: Yes    Partners: Male    Birth control/protection: Post-menopausal  Other Topics Concern  . Not on file  Social History Narrative   ** Merged History Encounter **       Originally from [place].   Trained as an Chief Financial Officer.    Married '75- 105 years, divorced; Married '86. No children.    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 Sculptor-life size figures    by commission (see work in [place]  Sculpting work regularly.   Never smoking.   Alcohol: drinks1- 2 beers and 1-2 wines every night;    Learning the piano. Patient get regular exercise.   Wife is a Marine scientist.   Caffeine: 3/day    ADLs: drives; independent with ADLs   Advanced Directives: YES; FULL CODE; no prolonged measures.        Social Determinants of  Health   Financial Resource Strain:   . Difficulty of Paying Living Expenses:   Food Insecurity:   . Worried About Charity fundraiser in the Last Year:   . Arboriculturist in the Last Year:   Transportation Needs:   . Film/video editor (Medical):   Marland Kitchen Lack of Transportation (Non-Medical):   Physical Activity:   . Days of Exercise per Week:   . Minutes of Exercise per Session:   Stress:   . Feeling of Stress :   Social Connections:   . Frequency of Communication with Friends and Family:   . Frequency of Social Gatherings with Friends and Family:   .  Attends Religious Services:   . Active Member of Clubs or Organizations:   . Attends Archivist Meetings:   Marland Kitchen Marital Status:      Family History: The patient'sfamily history includes Cancer in his brother; Cancer (age of onset: 58) in his father; Cancer (age of onset: 28) in his sister; Pancreatic cancer in his sister; Stomach cancer in his father.  ROS:   Please see the history of present illness.    All other systems reviewed and are negative.  EKGs/Labs/Other Studies Reviewed:    The following studies were reviewed today:  EKG:  EKG is  ordered today.  The ekg ordered today demonstrates sinus rhythm, first-degree AV block, left bundle branch block, rate 64  Recent Labs: 03/19/2019: ALT 28 05/05/2019: BUN 8; Creatinine, Ser 0.88; Hemoglobin 14.7; Platelets 201; Potassium 3.9; Sodium 134; TSH 0.872  Recent Lipid Panel    Component Value Date/Time   CHOL 164 03/19/2019 0911   TRIG 158 (H) 03/19/2019 0911   HDL 45 03/19/2019 0911   CHOLHDL 3.6 03/19/2019 0911   CHOLHDL 5.1 (H) 06/22/2015 1431   VLDL 21 06/22/2015 1431   LDLCALC 92 03/19/2019 0911   LDLDIRECT 124.7 12/22/2010 1010    Physical Exam:    VS:  BP 140/90   Pulse 64   Temp (!) 97.1 F (36.2 C)   Ht 5\' 10"  (1.778 m)   Wt 188 lb 12.8 oz (85.6 kg)   SpO2 95%   BMI 27.09 kg/m     Wt Readings from Last 3 Encounters:  05/09/19 188 lb 12.8  oz (85.6 kg)  05/05/19 185 lb (83.9 kg)  05/05/19 189 lb (85.7 kg)     GEN: Well nourished, well developed in no acute distress HEENT: Normal NECK: No JVD CARDIAC: RRR, no murmurs, rubs, gallops RESPIRATORY:  Clear to auscultation without rales, wheezing or rhonchi  ABDOMEN: Soft, non-tender, non-distended MUSCULOSKELETAL:  No edema; No deformity  SKIN: Warm and dry NEUROLOGIC:  Alert and oriented x 3 PSYCHIATRIC:  Normal affect   ASSESSMENT:    1. LBBB (left bundle branch block)   2. Essential hypertension   3. Hyperlipidemia, unspecified hyperlipidemia type    PLAN:     Left bundle branch block: Will check TTE to evaluate for systolic dysfunction.  If EF reduced, will plan ischemia evaluation  Hypertension: On lisinopril 40 mg daily.  BP elevated, will follow up echo results and plan to add additional antihypertensive.  Will add carvedilol if systolic dysfunction.  If no systolic dysfunction, will plan to add amlodipine  Hyperlipidemia: On Lipitor 10 mg daily  RTC in 3 weeks  Medication Adjustments/Labs and Tests Ordered: Current medicines are reviewed at length with the patient today.  Concerns regarding medicines are outlined above.  Orders Placed This Encounter  Procedures  . EKG 12-Lead  . ECHOCARDIOGRAM COMPLETE   No orders of the defined types were placed in this encounter.   Patient Instructions  Medication Instructions:  Your physician recommends that you continue on your current medications as directed. Please refer to the Current Medication list given to you today.  Testing/Procedures: Your physician has requested that you have an echocardiogram. Echocardiography is a painless test that uses sound waves to create images of your heart. It provides your doctor with information about the size and shape of your heart and how well your heart's chambers and valves are working. This procedure takes approximately one hour. There are no restrictions for this  procedure. This will be done  at our [facility] location:  [place]: At [facility], you and your health needs are our priority.  As part of our continuing mission to provide you with exceptional heart care, we have created designated Provider Care Teams.  These Care Teams include your primary Cardiologist (physician) and Advanced Practice Providers (APPs -  Physician Assistants and Nurse Practitioners) who all work together to provide you with the care you need, when you need it.  We recommend signing up for the patient portal called "MyChart".  Sign up information is provided on this After Visit Summary.  MyChart is used to connect with patients for Virtual Visits (Telemedicine).  Patients are able to view lab/test results, encounter notes, upcoming appointments, etc.  Non-urgent messages can be sent to your provider as well.   To learn more about what you can do with MyChart, go to [email or web address].    Your next appointment:   3 week(s)  The format for your next appointment:   In Person  Provider:   Oswaldo Milian, MD       Signed, Donato Heinz, MD  05/10/2019 10:04 PM    [place]

## 2019-05-09 NOTE — Patient Instructions (Signed)
Medication Instructions:  Your physician recommends that you continue on your current medications as directed. Please refer to the Current Medication list given to you today.  Testing/Procedures: Your physician has requested that you have an echocardiogram. Echocardiography is a painless test that uses sound waves to create images of your heart. It provides your doctor with information about the size and shape of your heart and how well your heart's chambers and valves are working. This procedure takes approximately one hour. There are no restrictions for this procedure. This will be done at our [facility] location:  [place]: At [facility], you and your health needs are our priority.  As part of our continuing mission to provide you with exceptional heart care, we have created designated Provider Care Teams.  These Care Teams include your primary Cardiologist (physician) and Advanced Practice Providers (APPs -  Physician Assistants and Nurse Practitioners) who all work together to provide you with the care you need, when you need it.  We recommend signing up for the patient portal called "MyChart".  Sign up information is provided on this After Visit Summary.  MyChart is used to connect with patients for Virtual Visits (Telemedicine).  Patients are able to view lab/test results, encounter notes, upcoming appointments, etc.  Non-urgent messages can be sent to your provider as well.   To learn more about what you can do with MyChart, go to [email or web address].    Your next appointment:   3 week(s)  The format for your next appointment:   In Person  Provider:   Oswaldo Milian, MD

## 2019-05-14 ENCOUNTER — Encounter: Payer: Self-pay | Admitting: Emergency Medicine

## 2019-05-15 NOTE — Telephone Encounter (Signed)
Dr. Truitt Leep pt you saw him 05/05/2019 for his Vertigo, thought he had been doing well then had an episode this morning, has the Echo set up for May 4th, he is wondering if theres anything he can do to help the vertigo, or with he should wait to do anything else until after his Echo.

## 2019-05-22 ENCOUNTER — Ambulatory Visit: Payer: Medicare Other | Admitting: Emergency Medicine

## 2019-05-27 ENCOUNTER — Ambulatory Visit (HOSPITAL_COMMUNITY): Payer: Medicare Other | Attending: Cardiology

## 2019-05-27 ENCOUNTER — Other Ambulatory Visit: Payer: Self-pay

## 2019-05-27 DIAGNOSIS — I447 Left bundle-branch block, unspecified: Secondary | ICD-10-CM | POA: Insufficient documentation

## 2019-06-01 NOTE — Progress Notes (Signed)
Cardiology Office Note:    Date:  06/03/2019   ID:  Oscar Smith, DOB 03-11-1945, MRN [id number]  PCP:  Horald Pollen, MD  Cardiologist:  No primary care provider on file.  Electrophysiologist:  None   Referring MD: Horald Pollen, *   Chief Complaint  Patient presents with  . Abnormal ECG    History of Present Illness:    Oscar Smith is a 74 y.o. male with a hx of prostate cancer, hypertension who presents for follow-up.  He was referred by Dr. Mitchel Honour for evaluation of left bundle branch block, initially seen on 05/09/2019.  He went to the ED on 05/06/2019 for dizziness.  Felt to be consistent with vertigo.  However work-up notable for new left bundle branch block.  High-sensitivity troponins 11->19->20.  Walks 3-4 times per week for about 25 minutes.  No chest pain, dyspnea, or syncope.  Does report intermittent episodes where it feels like heart is racing, occurs 1-2 times per month.  Can last minutes.  TTE on 05/27/19 showed LVEF 55 to 123456, grade 1 diastolic dysfunction, normal RV systolic function no significant valvular disease.  Since last clinic visit, he reports that he has been doing well.  He is walking 3 times per week for about 30 minutes.  He denies any exertional chest pain or dyspnea.  Main complaint is that he feels tired throughout the day.  His wife reports that he snores and she has observed periods where he seems to stop breathing.  States that he had a sleep apnea test several years ago but was unable to fall asleep during the test.  He brought his BP log with him, has been 130-160s/60-90s.  Past Medical History:  Diagnosis Date  . Adenocarcinoma of prostate [facility]) urologist-  dr Tresa Moore--  dx Nov 2011--  T1c, Gleason 3+3,  PSA 4.2 (post IMRT 03/2010   Low risk recurrence, Biochemical control,  last PSA 0.30 on 05-28-2014  . BPH (benign prostatic hyperplasia)   . Cancer ([place])   . Diverticulitis   . ED (erectile dysfunction) of organic  origin   . GERD (gastroesophageal reflux disease)   . Hiatal hernia   . History of colon polyps    2002 and 2003 hyperplastic  . History of gastritis   . Hypertension   . Lower urinary tract symptoms (LUTS)   . Phimosis   . Sleep apnea    did not complete study.    . Wears hearing aid    bilateral    Past Surgical History:  Procedure Laterality Date  . ABDOMINAL HERNIA REPAIR    . CIRCUMCISION N/A 06/09/2015   Procedure: CIRCUMCISION ADULT WITH PENILE BLOCK;  Surgeon: Alexis Frock, MD;  Location: [facility];  Service: Urology;  Laterality: N/A;  . COLONOSCOPY  last one 11-15-2011  . ENDOVENOUS ABLATION SAPHENOUS VEIN W/ LASER Left 02/10/2016   endovenous laser ablation left greater saphenous vein and stab phlebectomy left leg by Curt Jews MD  . INGUINAL HERNIA REPAIR Right 12-23-2009  . left shoulder surgery    . LIPOMA EXCISION N/A 12/01/2016   Procedure: EXCISION POSTERIOR NECK MASS;  Surgeon: Irene Limbo, MD;  Location: [facility];  Service: Plastics;  Laterality: N/A;  . PROSTATE SURGERY  2009  . PROSTATE SURGERY    . SHOULDER ARTHROSCOPY WITH SUBACROMIAL DECOMPRESSION, ROTATOR CUFF REPAIR AND BICEP TENDON REPAIR Left 10-2014    Current Medications: Current Meds  Medication Sig  . aspirin  EC 81 MG tablet Take 81 mg daily by mouth.  Marland Kitchen atorvastatin (LIPITOR) 10 MG tablet TAKE 1 TABLET ONCE DAILY.  . diphenhydramine-acetaminophen (TYLENOL PM) 25-500 MG TABS tablet Take 1 tablet by mouth at bedtime as needed (for sleep).  . fluticasone (FLONASE) 50 MCG/ACT nasal spray Place 2 sprays into both nostrils daily.  Marland Kitchen ipratropium (ATROVENT) 0.03 % nasal spray Place 2 sprays into both nostrils 2 (two) times daily.  Marland Kitchen lisinopril (ZESTRIL) 40 MG tablet Take 1 tablet (40 mg total) by mouth daily.  . meclizine (ANTIVERT) 25 MG tablet Take 1 tablet (25 mg total) by mouth 3 (three) times daily as needed for up to 25 doses for dizziness.  .  methylPREDNISolone (MEDROL DOSEPAK) 4 MG TBPK tablet As directed for 6 days.  . Multiple Vitamin (MULTIVITAMIN) tablet Take 1 tablet by mouth daily.  Marland Kitchen neomycin-polymyxin-dexamethasone (MAXITROL) 0.1 % ophthalmic suspension 1 drop See admin instructions. Apply to affected eyelid as directed for 2-3 days for infection  . NON FORMULARY Take 1 capsule by mouth See admin instructions. Metagenics UltraFlora IB Daily Probiotic, Targeted Relief capsules- Take 1 capsule by mouth once a day as needed for diarrhea symptoms  . Omega-3 Fatty Acids (FISH OIL OMEGA-3 PO) Take 1 capsule by mouth daily.   Marland Kitchen omeprazole (PRILOSEC) 20 MG capsule TAKE 1 CAPSULE 15-20 MINUTES BEFORE BREAKFAST.     Allergies:   Flomax [tamsulosin hcl] and Latex   Social History   Socioeconomic History  . Marital status: Married    Spouse name: Not on file  . Number of children: 0  . Years of education: BS  . Highest education level: Not on file  Occupational History  . Occupation: Development worker, community  . Occupation: Chief Financial Officer  Tobacco Use  . Smoking status: Former Smoker    Types: Cigarettes    Quit date: 09/04/1965    Years since quitting: 53.7  . Smokeless tobacco: Never Used  . Tobacco comment: quit around 74 yrs old.  Substance and Sexual Activity  . Alcohol use: Yes    Alcohol/week: 20.0 standard drinks    Types: 20 Standard drinks or equivalent per week    Comment: nightly  . Drug use: No  . Sexual activity: Yes    Partners: Male    Birth control/protection: Post-menopausal  Other Topics Concern  . Not on file  Social History Narrative   ** Merged History Encounter **       Originally from [place].   Trained as an Chief Financial Officer.    Married '75- 42 years, divorced; Married '86. No children.    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 Sculptor-life size figures    by commission (see work in [place]  Sculpting work regularly.   Never  smoking.   Alcohol: drinks1- 2 beers and 1-2 wines every night;    Learning the piano. Patient get regular exercise.   Wife is a Marine scientist.   Caffeine: 3/day    ADLs: drives; independent with ADLs   Advanced Directives: YES; FULL CODE; no prolonged measures.        Social Determinants of Health   Financial Resource Strain:   . Difficulty of Paying Living Expenses:   Food Insecurity:   . Worried About Charity fundraiser in the Last Year:   . Arboriculturist in the Last Year:   Transportation Needs:   . Film/video editor (Medical):   Marland Kitchen Lack of Transportation (  Non-Medical):   Physical Activity:   . Days of Exercise per Week:   . Minutes of Exercise per Session:   Stress:   . Feeling of Stress :   Social Connections:   . Frequency of Communication with Friends and Family:   . Frequency of Social Gatherings with Friends and Family:   . Attends Religious Services:   . Active Member of Clubs or Organizations:   . Attends Archivist Meetings:   Marland Kitchen Marital Status:      Family History: The patient'sfamily history includes Cancer in his brother; Cancer (age of onset: 44) in his father; Cancer (age of onset: 48) in his sister; Pancreatic cancer in his sister; Stomach cancer in his father.  ROS:   Please see the history of present illness.    All other systems reviewed and are negative.  EKGs/Labs/Other Studies Reviewed:    The following studies were reviewed today:  EKG:  EKG is not ordered today.  The ekg ordered most recently demonstrates sinus rhythm, first-degree AV block, left bundle branch block, rate 64  TTE 05/27/19: 1. Left ventricular ejection fraction, by estimation, is 55 to 60%. The  left ventricle has normal function. The left ventricle has no regional  wall motion abnormalities. Left ventricular diastolic parameters are  consistent with Grade I diastolic  dysfunction (impaired relaxation).  2. Right ventricular systolic function is normal. The right  ventricular  size is mildly enlarged.  3. Left atrial size was mildly dilated.  4. The mitral valve is normal in structure. Trivial mitral valve  regurgitation. No evidence of mitral stenosis.  5. The aortic valve is tricuspid. Aortic valve regurgitation is not  visualized. Mild aortic valve sclerosis is present, with no evidence of  aortic valve stenosis.  6. The inferior vena cava is normal in size with greater than 50%  respiratory variability, suggesting right atrial pressure of 3 mmHg.   Recent Labs: 03/19/2019: ALT 28 05/05/2019: BUN 8; Creatinine, Ser 0.88; Hemoglobin 14.7; Platelets 201; Potassium 3.9; Sodium 134; TSH 0.872  Recent Lipid Panel    Component Value Date/Time   CHOL 164 03/19/2019 0911   TRIG 158 (H) 03/19/2019 0911   HDL 45 03/19/2019 0911   CHOLHDL 3.6 03/19/2019 0911   CHOLHDL 5.1 (H) 06/22/2015 1431   VLDL 21 06/22/2015 1431   LDLCALC 92 03/19/2019 0911   LDLDIRECT 124.7 12/22/2010 1010    Physical Exam:    VS:  BP (!) 148/82   Pulse 89   Ht 5\' 10"  (1.778 m)   Wt 190 lb 12.8 oz (86.5 kg)   SpO2 97%   BMI 27.38 kg/m     Wt Readings from Last 3 Encounters:  06/03/19 190 lb 12.8 oz (86.5 kg)  05/09/19 188 lb 12.8 oz (85.6 kg)  05/05/19 185 lb (83.9 kg)     GEN: Well nourished, well developed in no acute distress HEENT: Normal NECK: No JVD CARDIAC: RRR, no murmurs, rubs, gallops RESPIRATORY:  Clear to auscultation without rales, wheezing or rhonchi  ABDOMEN: Soft, non-tender, non-distended MUSCULOSKELETAL:  No edema SKIN: Warm and dry NEUROLOGIC:  Alert and oriented x 3 PSYCHIATRIC:  Normal affect   ASSESSMENT:    1. LBBB (left bundle branch block)   2. Essential hypertension   3. Hyperlipidemia, unspecified hyperlipidemia type   4. Snoring   5. Witnessed apneic spells   6. Excessive daytime sleepiness    PLAN:     Left bundle branch block: TTE on 05/27/19 showed LVEF 55  to 123456, grade 1 diastolic dysfunction, normal RV systolic  function no significant valvular disease.  No symptoms to suggest ischemia.  Hypertension: On lisinopril 40 mg daily.  BP has been elevated, will add amlodipine 5 mg daily.  Asked patient to check BP daily for next 2 weeks and call with results  Hyperlipidemia: On Lipitor 10 mg daily.  LDL 92 on 03/19/2019  Snoring/daytime somnolence: Will check sleep study  RTC in 6 months  Medication Adjustments/Labs and Tests Ordered: Current medicines are reviewed at length with the patient today.  Concerns regarding medicines are outlined above.  Orders Placed This Encounter  Procedures  . Home sleep test   Meds ordered this encounter  Medications  . amLODipine (NORVASC) 5 MG tablet    Sig: Take 1 tablet (5 mg total) by mouth daily.    Dispense:  90 tablet    Refill:  3    Patient Instructions  Medication Instructions:  START amlodipine 5 mg daily  *If you need a refill on your cardiac medications before your next appointment, please call your pharmacy*  Testing/Procedures: Your physician has recommended that you have a sleep study (home sleep study).  Follow-Up: At [facility], you and your health needs are our priority.  As part of our continuing mission to provide you with exceptional heart care, we have created designated Provider Care Teams.  These Care Teams include your primary Cardiologist (physician) and Advanced Practice Providers (APPs -  Physician Assistants and Nurse Practitioners) who all work together to provide you with the care you need, when you need it.  We recommend signing up for the patient portal called "MyChart".  Sign up information is provided on this After Visit Summary.  MyChart is used to connect with patients for Virtual Visits (Telemedicine).  Patients are able to view lab/test results, encounter notes, upcoming appointments, etc.  Non-urgent messages can be sent to your provider as well.   To learn more about what you can do with MyChart, go to  [email or web address].    Your next appointment:   6 month(s)  The format for your next appointment:   In Person  Provider:   Oswaldo Milian, MD   Other Instructions Please monitor blood pressure daily at home, write it down.  Call or send via my chart the readings in 2 weeks for Dr. Gardiner Rhyme to review.      Signed, Donato Heinz, MD  06/03/2019 10:38 PM    [place]

## 2019-06-03 ENCOUNTER — Ambulatory Visit: Payer: Medicare Other | Admitting: Cardiology

## 2019-06-03 ENCOUNTER — Other Ambulatory Visit: Payer: Self-pay

## 2019-06-03 ENCOUNTER — Encounter: Payer: Self-pay | Admitting: Cardiology

## 2019-06-03 VITALS — BP 148/82 | HR 89 | Ht 70.0 in | Wt 190.8 lb

## 2019-06-03 DIAGNOSIS — I10 Essential (primary) hypertension: Secondary | ICD-10-CM | POA: Diagnosis not present

## 2019-06-03 DIAGNOSIS — R0683 Snoring: Secondary | ICD-10-CM | POA: Diagnosis not present

## 2019-06-03 DIAGNOSIS — I447 Left bundle-branch block, unspecified: Secondary | ICD-10-CM | POA: Diagnosis not present

## 2019-06-03 DIAGNOSIS — E785 Hyperlipidemia, unspecified: Secondary | ICD-10-CM | POA: Diagnosis not present

## 2019-06-03 DIAGNOSIS — R0681 Apnea, not elsewhere classified: Secondary | ICD-10-CM

## 2019-06-03 DIAGNOSIS — G4719 Other hypersomnia: Secondary | ICD-10-CM

## 2019-06-03 MED ORDER — AMLODIPINE BESYLATE 5 MG PO TABS: 5.0000 mg | ORAL_TABLET | Freq: Every day | ORAL | 3 refills | Status: DC

## 2019-06-03 NOTE — Patient Instructions (Addendum)
Medication Instructions:  START amlodipine 5 mg daily  *If you need a refill on your cardiac medications before your next appointment, please call your pharmacy*  Testing/Procedures: Your physician has recommended that you have a sleep study (home sleep study).  Follow-Up: At [facility], you and your health needs are our priority.  As part of our continuing mission to provide you with exceptional heart care, we have created designated Provider Care Teams.  These Care Teams include your primary Cardiologist (physician) and Advanced Practice Providers (APPs -  Physician Assistants and Nurse Practitioners) who all work together to provide you with the care you need, when you need it.  We recommend signing up for the patient portal called "MyChart".  Sign up information is provided on this After Visit Summary.  MyChart is used to connect with patients for Virtual Visits (Telemedicine).  Patients are able to view lab/test results, encounter notes, upcoming appointments, etc.  Non-urgent messages can be sent to your provider as well.   To learn more about what you can do with MyChart, go to [email or web address].    Your next appointment:   6 month(s)  The format for your next appointment:   In Person  Provider:   Oswaldo Milian, MD   Other Instructions Please monitor blood pressure daily at home, write it down.  Call or send via my chart the readings in 2 weeks for Dr. Gardiner Rhyme to review.

## 2019-06-09 DIAGNOSIS — R350 Frequency of micturition: Secondary | ICD-10-CM | POA: Diagnosis not present

## 2019-07-09 ENCOUNTER — Telehealth: Payer: Self-pay | Admitting: *Deleted

## 2019-07-09 NOTE — Telephone Encounter (Signed)
Patient scheduled for HST. He will be notified via [place].

## 2019-07-14 ENCOUNTER — Telehealth: Payer: Self-pay | Admitting: Emergency Medicine

## 2019-07-14 ENCOUNTER — Ambulatory Visit (INDEPENDENT_AMBULATORY_CARE_PROVIDER_SITE_OTHER): Payer: Medicare Other | Admitting: Emergency Medicine

## 2019-07-14 VITALS — BP 148/82 | Ht 70.0 in | Wt 190.0 lb

## 2019-07-14 DIAGNOSIS — Z0000 Encounter for general adult medical examination without abnormal findings: Secondary | ICD-10-CM

## 2019-07-14 NOTE — Progress Notes (Signed)
Presents today for [employer] Visit   Date of last exam:03-19-2019  Interpreter used for this visit? No   I connected with  Oscar Smith on 07/14/19 by a telephone and verified that I am speaking with the correct person using two identifiers.   I discussed the limitations of evaluation and management by telemedicine. The patient expressed understanding and agreed to proceed.    Patient Care Team: Horald Pollen, MD as PCP - General (Internal Medicine) Wardell Honour, MD as Consulting Physician (Family Medicine) Juanita Craver, MD as Consulting Physician (Gastroenterology) Sharyne Peach, MD as Consulting Physician (Ophthalmology) Donn Pierini, Jenetta Downer, DMD as Consulting Physician (Dentistry) Wardell Honour, MD (Family Medicine) Alexis Frock, MD as Consulting Physician (Urology)   Other items to address today:   Discussed Eye/Dental Discussed immunizations Follow up 8-25 _0 :40 chronic medical conditions ([facility])    Other Screening: Last screening for diabetes: 05/05/2019 Last lipid screening: 03/19/2019  ADVANCE DIRECTIVES: Discussed: yes On File: yes Materials Provided: no  Immunization status:  Immunization History  Administered Date(s) Administered  . Fluad Quad(high Dose 65+) 09/18/2018  . Influenza Split 10/21/2010, 10/18/2011  . Influenza, High Dose Seasonal PF 10/31/2017  . Influenza,inj,Quad PF,6+ Mos 11/01/2012, 09/25/2013, 11/02/2015, 11/20/2016  . Influenza-Unspecified 10/23/2016  . MMR 02/13/2013  . PFIZER SARS-COV-2 Vaccination 02/27/2019, 03/24/2019  . Pneumococcal Conjugate-13 06/22/2015  . Pneumococcal Polysaccharide-23 08/21/2008  . Tdap 01/30/2013  . Zoster 10/21/2010  . Zoster Recombinat (Shingrix) 04/09/2018, 07/04/2018     There are no preventive care reminders to display for this patient.   Functional Status Survey: Is the patient deaf or have difficulty hearing?: Yes (hearing aids both  ears) Does the patient have difficulty seeing, even when wearing glasses/contacts?: No Does the patient have difficulty concentrating, remembering, or making decisions?: No Does the patient have difficulty walking or climbing stairs?: No Does the patient have difficulty dressing or bathing?: No Does the patient have difficulty doing errands alone such as visiting a doctor's office or shopping?: No   6CIT Screen 07/14/2019 07/10/2018 09/26/2016  What Year? 0 points 0 points 0 points  What month? 0 points 0 points 0 points  What time? 0 points 0 points 0 points  Count back from 20 0 points 0 points 0 points  Months in reverse 0 points 0 points 2 points  Repeat phrase 0 points 0 points 0 points  Total Score 0 0 2        Clinical Support from 07/14/2019 in [facility]  AUDIT-C Score 5       Home Environment:   Lives with wife in one story home No scattered rugs No grab bars Adequate lighting/no clutter No trouble climbing stairs    Patient Active Problem List   Diagnosis Date Noted  . Essential hypertension 07/03/2018  . Pure hypercholesterolemia 07/03/2018  . Osteoarthritis of right hip 02/07/2018  . Lumbar radiculopathy 01/24/2017  . Enlarged prostate with urinary retention 12/25/2013  . Erectile dysfunction 12/25/2013  . Incomplete bladder emptying 08/27/2013  . Chronic sinusitis 02/20/2012  . Frontal mucocele 02/20/2012  . Nasal septal deviation 02/20/2012  . ADENOCARCINOMA, PROSTATE, GLEASON GRADE 6 12/21/2009  . GERD 11/12/2008     Past Medical History:  Diagnosis Date  . Adenocarcinoma of prostate [facility]) urologist-  dr Tresa Moore--  dx Nov 2011--  T1c, Gleason 3+3,  PSA 4.2 (post IMRT 03/2010   Low risk recurrence, Biochemical control,  last PSA 0.30 on 05-28-2014  . BPH (  benign prostatic hyperplasia)   . Cancer ([place])   . Diverticulitis   . ED (erectile dysfunction) of organic origin   . GERD (gastroesophageal reflux disease)   . Hiatal hernia   .  History of colon polyps    2002 and 2003 hyperplastic  . History of gastritis   . Hypertension   . Lower urinary tract symptoms (LUTS)   . Phimosis   . Sleep apnea    did not complete study.    . Wears hearing aid    bilateral     Past Surgical History:  Procedure Laterality Date  . ABDOMINAL HERNIA REPAIR    . CIRCUMCISION N/A 06/09/2015   Procedure: CIRCUMCISION ADULT WITH PENILE BLOCK;  Surgeon: Alexis Frock, MD;  [facility];  Service: Urology;  Laterality: N/A;  . COLONOSCOPY  last one 11-15-2011  . ENDOVENOUS ABLATION SAPHENOUS VEIN W/ LASER Left 02/10/2016   endovenous laser ablation left greater saphenous vein and stab phlebectomy left leg by Curt Jews MD  . HERNIA REPAIR N/A    Phreesia 07/11/2019  . INGUINAL HERNIA REPAIR Right 12-23-2009  . left shoulder surgery    . LIPOMA EXCISION N/A 12/01/2016   Procedure: EXCISION POSTERIOR NECK MASS;  Surgeon: Irene Limbo, MD;  Location: [facility];  Service: Plastics;  Laterality: N/A;  . PROSTATE SURGERY  2009  . PROSTATE SURGERY    . SHOULDER ARTHROSCOPY WITH SUBACROMIAL DECOMPRESSION, ROTATOR CUFF REPAIR AND BICEP TENDON REPAIR Left 10-2014     Family History  Problem Relation Age of Onset  . Stomach cancer Father   . Cancer Father 74       stomach versus colon cancer  . Pancreatic cancer Sister   . Cancer Sister 67       pancreatic cancer  . Cancer Brother        penile cancer     Social History   Socioeconomic History  . Marital status: Married    Spouse name: Not on file  . Number of children: 0  . Years of education: BS  . Highest education level: Not on file  Occupational History  . Occupation: Development worker, community  . Occupation: Chief Financial Officer  Tobacco Use  . Smoking status: Former Smoker    Types: Cigarettes    Quit date: 09/04/1965    Years since quitting: 53.8  . Smokeless tobacco: Never Used  . Tobacco comment: quit around 74 yrs old.  Substance and Sexual  Activity  . Alcohol use: Yes    Alcohol/week: 20.0 standard drinks    Types: 20 Standard drinks or equivalent per week    Comment: nightly  . Drug use: No  . Sexual activity: Yes    Partners: Male    Birth control/protection: Post-menopausal  Other Topics Concern  . Not on file  Social History Narrative   ** Merged History Encounter **       Originally from [place].   Trained as an Chief Financial Officer.    Married '75- 62 years, divorced; Married '86. No children.    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 Sculptor-life size figures    by commission (see work in [place]  Sculpting work regularly.   Never smoking.   Alcohol: drinks1- 2 beers and 1-2 wines every night;    Learning the piano. Patient get regular exercise.   Wife is a Marine scientist.   Caffeine: 3/day    ADLs: drives; independent with ADLs  Advanced Directives: YES; FULL CODE; no prolonged measures.        Social Determinants of Health   Financial Resource Strain:   . Difficulty of Paying Living Expenses:   Food Insecurity:   . Worried About Charity fundraiser in the Last Year:   . Arboriculturist in the Last Year:   Transportation Needs:   . Film/video editor (Medical):   Marland Kitchen Lack of Transportation (Non-Medical):   Physical Activity:   . Days of Exercise per Week:   . Minutes of Exercise per Session:   Stress:   . Feeling of Stress :   Social Connections:   . Frequency of Communication with Friends and Family:   . Frequency of Social Gatherings with Friends and Family:   . Attends Religious Services:   . Active Member of Clubs or Organizations:   . Attends Archivist Meetings:   Marland Kitchen Marital Status:   Intimate Partner Violence:   . Fear of Current or Ex-Partner:   . Emotionally Abused:   Marland Kitchen Physically Abused:   . Sexually Abused:      Allergies  Allergen Reactions  . Other   . Flomax [Tamsulosin Hcl] Other (See  Comments)    "bad reaction" = jaw pain  . Latex Itching and Rash     Prior to Admission medications   Medication Sig Start Date End Date Taking? Authorizing Provider  amLODipine (NORVASC) 5 MG tablet Take 1 tablet (5 mg total) by mouth daily. 06/03/19 09/01/19 Yes Donato Heinz, MD  aspirin EC 81 MG tablet Take 81 mg daily by mouth.   Yes [provider]  atorvastatin (LIPITOR) 10 MG tablet TAKE 1 TABLET ONCE DAILY. 05/09/19  Yes Sagardia, Ines Bloomer, MD  diphenhydramine-acetaminophen (TYLENOL PM) 25-500 MG TABS tablet Take 1 tablet by mouth at bedtime as needed (for sleep).   Yes [provider]  fluticasone (FLONASE) 50 MCG/ACT nasal spray Place 2 sprays into both nostrils daily. 09/03/18  Yes Sagardia, Ines Bloomer, MD  ipratropium (ATROVENT) 0.03 % nasal spray Place 2 sprays into both nostrils 2 (two) times daily. 06/25/17  Yes Wardell Honour, MD  lisinopril (ZESTRIL) 40 MG tablet Take 1 tablet (40 mg total) by mouth daily. 11/11/18  Yes Sagardia, Ines Bloomer, MD  meclizine (ANTIVERT) 25 MG tablet Take 1 tablet (25 mg total) by mouth 3 (three) times daily as needed for up to 25 doses for dizziness. 05/06/19  Yes Wyvonnia Dusky, MD  methylPREDNISolone (MEDROL DOSEPAK) 4 MG TBPK tablet As directed for 6 days. 12/02/18  Yes Hilts, Legrand Como, MD  Multiple Vitamin (MULTIVITAMIN) tablet Take 1 tablet by mouth daily.   Yes [provider]  neomycin-polymyxin-dexamethasone (MAXITROL) 0.1 % ophthalmic suspension 1 drop See admin instructions. Apply to affected eyelid as directed for 2-3 days for infection   Yes [provider]  NON FORMULARY Take 1 capsule by mouth See admin instructions. Metagenics UltraFlora IB Daily Probiotic, Targeted Relief capsules- Take 1 capsule by mouth once a day as needed for diarrhea symptoms   Yes [provider]  Omega-3 Fatty Acids (FISH OIL OMEGA-3 PO) Take 1 capsule by mouth daily.    Yes [provider]   omeprazole (PRILOSEC) 20 MG capsule TAKE 1 CAPSULE 15-20 MINUTES BEFORE BREAKFAST. 05/09/19  Yes Horald Pollen, MD     Depression screen [facility] 2/9 07/14/2019 05/05/2019 03/19/2019 09/18/2018 07/10/2018  Decreased Interest 0 0 0 0 0  Down, Depressed, Hopeless 0 0  0 0 0  PHQ - 2 Score 0 0 0 0 0     Fall Risk  07/14/2019 05/05/2019 03/19/2019 11/25/2018 09/18/2018  Falls in the past year? 0 0 0 0 0  Number falls in past yr: 0 - 0 - -  Injury with Fall? 0 - 0 - -  Follow up Falls evaluation completed;Education provided Falls evaluation completed Falls evaluation completed Falls evaluation completed Falls evaluation completed      PHYSICAL EXAM: BP (!) 148/82 Comment: taken from a previous visit  Ht _0  (1.778 m)   Wt 190 lb (86.2 kg)   BMI 27.26 kg/m    Wt Readings from Last 3 Encounters:  07/14/19 190 lb (86.2 kg)  06/03/19 190 lb 12.8 oz (86.5 kg)  05/09/19 188 lb 12.8 oz (85.6 kg)       Education/Counseling provided regarding diet and exercise, prevention of chronic diseases, smoking/tobacco cessation, if applicable, and reviewed "Covered Medicare Preventive Services."

## 2019-07-14 NOTE — Patient Instructions (Addendum)
Thank you for taking time to come for your Medicare Wellness Visit. I appreciate your ongoing commitment to your health goals. Please review the following plan we discussed and let me know if I can assist you in the future.  Leroy Kennedy LPN  Preventive Care 76 Years and Older, Male Preventive care refers to lifestyle choices and visits with your health care provider that can promote health and wellness. This includes:  A yearly physical exam. This is also called an annual well check.  Regular dental and eye exams.  Immunizations.  Screening for certain conditions.  Healthy lifestyle choices, such as diet and exercise. What can I expect for my preventive care visit? Physical exam Your health care provider will check:  Height and weight. These may be used to calculate body mass index (BMI), which is a measurement that tells if you are at a healthy weight.  Heart rate and blood pressure.  Your skin for abnormal spots. Counseling Your health care provider may ask you questions about:  Alcohol, tobacco, and drug use.  Emotional well-being.  Home and relationship well-being.  Sexual activity.  Eating habits.  History of falls.  Memory and ability to understand (cognition).  Work and work Statistician. What immunizations do I need?  Influenza (flu) vaccine  This is recommended every year. Tetanus, diphtheria, and pertussis (Tdap) vaccine  You may need a Td booster every 10 years. Varicella (chickenpox) vaccine  You may need this vaccine if you have not already been vaccinated. Zoster (shingles) vaccine  You may need this after age 66. Pneumococcal conjugate (PCV13) vaccine  One dose is recommended after age 84. Pneumococcal polysaccharide (PPSV23) vaccine  One dose is recommended after age 88. Measles, mumps, and rubella (MMR) vaccine  You may need at least one dose of MMR if you were born in 1957 or later. You may also need a second dose. Meningococcal  conjugate (MenACWY) vaccine  You may need this if you have certain conditions. Hepatitis A vaccine  You may need this if you have certain conditions or if you travel or work in places where you may be exposed to hepatitis A. Hepatitis B vaccine  You may need this if you have certain conditions or if you travel or work in places where you may be exposed to hepatitis B. Haemophilus influenzae type b (Hib) vaccine  You may need this if you have certain conditions. You may receive vaccines as individual doses or as more than one vaccine together in one shot (combination vaccines). Talk with your health care provider about the risks and benefits of combination vaccines. What tests do I need? Blood tests  Lipid and cholesterol levels. These may be checked every 5 years, or more frequently depending on your overall health.  Hepatitis C test.  Hepatitis B test. Screening  Lung cancer screening. You may have this screening every year starting at age 24 if you have a 30-pack-year history of smoking and currently smoke or have quit within the past 15 years.  Colorectal cancer screening. All adults should have this screening starting at age 52 and continuing until age 61. Your health care provider may recommend screening at age 57 if you are at increased risk. You will have tests every 1-10 years, depending on your results and the type of screening test.  Prostate cancer screening. Recommendations will vary depending on your family history and other risks.  Diabetes screening. This is done by checking your blood sugar (glucose) after you have not eaten for  a while (fasting). You may have this done every 1-3 years.  Abdominal aortic aneurysm (AAA) screening. You may need this if you are a current or former smoker.  Sexually transmitted disease (STD) testing. Follow these instructions at home: Eating and drinking  Eat a diet that includes fresh fruits and vegetables, whole grains, lean  protein, and low-fat dairy products. Limit your intake of foods with high amounts of sugar, saturated fats, and salt.  Take vitamin and mineral supplements as recommended by your health care provider.  Do not drink alcohol if your health care provider tells you not to drink.  If you drink alcohol: ? Limit how much you have to 0-2 drinks a day. ? Be aware of how much alcohol is in your drink. In the [place] one drink equals one 12 oz bottle of beer (355 mL), one 5 oz glass of wine (148 mL), or one 1 oz glass of hard liquor (44 mL). Lifestyle  Take daily care of your teeth and gums.  Stay active. Exercise for at least 30 minutes on 5 or more days each week.  Do not use any products that contain nicotine or tobacco, such as cigarettes, e-cigarettes, and chewing tobacco. If you need help quitting, ask your health care provider.  If you are sexually active, practice safe sex. Use a condom or other form of protection to prevent STIs (sexually transmitted infections).  Talk with your health care provider about taking a low-dose aspirin or statin. What's next?  Visit your health care provider once a year for a well check visit.  Ask your health care provider how often you should have your eyes and teeth checked.  Stay up to date on all vaccines. This information is not intended to replace advice given to you by your health care provider. Make sure you discuss any questions you have with your health care provider. Document Revised: 01/03/2018 Document Reviewed: 01/03/2018 Elsevier Patient Education  2020 Elsevier Inc.  

## 2019-07-14 NOTE — Telephone Encounter (Signed)
Mr Oscar Smith called waiting for a call for his AWV and we got disconnected. I think he was waiting for call and may have to reschedule

## 2019-07-24 ENCOUNTER — Encounter: Payer: Self-pay | Admitting: Emergency Medicine

## 2019-08-05 ENCOUNTER — Other Ambulatory Visit: Payer: Self-pay | Admitting: Emergency Medicine

## 2019-08-05 DIAGNOSIS — H903 Sensorineural hearing loss, bilateral: Secondary | ICD-10-CM | POA: Diagnosis not present

## 2019-08-05 DIAGNOSIS — H6123 Impacted cerumen, bilateral: Secondary | ICD-10-CM | POA: Diagnosis not present

## 2019-09-17 ENCOUNTER — Ambulatory Visit (INDEPENDENT_AMBULATORY_CARE_PROVIDER_SITE_OTHER): Payer: Medicare Other | Admitting: Emergency Medicine

## 2019-09-17 ENCOUNTER — Other Ambulatory Visit: Payer: Self-pay

## 2019-09-17 ENCOUNTER — Encounter: Payer: Self-pay | Admitting: Emergency Medicine

## 2019-09-17 VITALS — BP 139/84 | HR 65 | Temp 98.1°F | Ht 70.0 in | Wt 189.8 lb

## 2019-09-17 DIAGNOSIS — R151 Fecal smearing: Secondary | ICD-10-CM | POA: Diagnosis not present

## 2019-09-17 DIAGNOSIS — I10 Essential (primary) hypertension: Secondary | ICD-10-CM

## 2019-09-17 DIAGNOSIS — E785 Hyperlipidemia, unspecified: Secondary | ICD-10-CM | POA: Insufficient documentation

## 2019-09-17 DIAGNOSIS — Z8719 Personal history of other diseases of the digestive system: Secondary | ICD-10-CM

## 2019-09-17 DIAGNOSIS — Z86010 Personal history of colonic polyps: Secondary | ICD-10-CM

## 2019-09-17 DIAGNOSIS — R0989 Other specified symptoms and signs involving the circulatory and respiratory systems: Secondary | ICD-10-CM | POA: Diagnosis not present

## 2019-09-17 DIAGNOSIS — C61 Malignant neoplasm of prostate: Secondary | ICD-10-CM

## 2019-09-17 NOTE — Patient Instructions (Addendum)
   If you have lab work done today you will be contacted with your lab results within the next 2 weeks.  If you have not heard from us then please contact us. The fastest way to get your results is to register for My Chart.   IF you received an x-ray today, you will receive an invoice from [facility]. Please contact [facility] at [phone] with questions or concerns regarding your invoice.   IF you received labwork today, you will receive an invoice from [facility]. Please contact [facility] at [phone] with questions or concerns regarding your invoice.   Our billing staff will not be able to assist you with questions regarding bills from these companies.  You will be contacted with the lab results as soon as they are available. The fastest way to get your results is to activate your My Chart account. Instructions are located on the last page of this paperwork. If you have not heard from us regarding the results in 2 weeks, please contact this office.     Health Maintenance After Age 65 After age 65, you are at a higher risk for certain long-term diseases and infections as well as injuries from falls. Falls are a major cause of broken bones and head injuries in people who are older than age 65. Getting regular preventive care can help to keep you healthy and well. Preventive care includes getting regular testing and making lifestyle changes as recommended by your health care provider. Talk with your health care provider about:  Which screenings and tests you should have. A screening is a test that checks for a disease when you have no symptoms.  A diet and exercise plan that is right for you. What should I know about screenings and tests to prevent falls? Screening and testing are the best ways to find a health problem early. Early diagnosis and treatment give you the best chance of managing medical conditions that are common after age 65. Certain conditions and  lifestyle choices may make you more likely to have a fall. Your health care provider may recommend:  Regular vision checks. Poor vision and conditions such as cataracts can make you more likely to have a fall. If you wear glasses, make sure to get your prescription updated if your vision changes.  Medicine review. Work with your health care provider to regularly review all of the medicines you are taking, including over-the-counter medicines. Ask your health care provider about any side effects that may make you more likely to have a fall. Tell your health care provider if any medicines that you take make you feel dizzy or sleepy.  Osteoporosis screening. Osteoporosis is a condition that causes the bones to get weaker. This can make the bones weak and cause them to break more easily.  Blood pressure screening. Blood pressure changes and medicines to control blood pressure can make you feel dizzy.  Strength and balance checks. Your health care provider may recommend certain tests to check your strength and balance while standing, walking, or changing positions.  Foot health exam. Foot pain and numbness, as well as not wearing proper footwear, can make you more likely to have a fall.  Depression screening. You may be more likely to have a fall if you have a fear of falling, feel emotionally low, or feel unable to do activities that you used to do.  Alcohol use screening. Using too much alcohol can affect your balance and may make you more likely to   have a fall. What actions can I take to lower my risk of falls? General instructions  Talk with your health care provider about your risks for falling. Tell your health care provider if: ? You fall. Be sure to tell your health care provider about all falls, even ones that seem minor. ? You feel dizzy, sleepy, or off-balance.  Take over-the-counter and prescription medicines only as told by your health care provider. These include any  supplements.  Eat a healthy diet and maintain a healthy weight. A healthy diet includes low-fat dairy products, low-fat (lean) meats, and fiber from whole grains, beans, and lots of fruits and vegetables. Home safety  Remove any tripping hazards, such as rugs, cords, and clutter.  Install safety equipment such as grab bars in bathrooms and safety rails on stairs.  Keep rooms and walkways well-lit. Activity   Follow a regular exercise program to stay fit. This will help you maintain your balance. Ask your health care provider what types of exercise are appropriate for you.  If you need a cane or walker, use it as recommended by your health care provider.  Wear supportive shoes that have nonskid soles. Lifestyle  Do not drink alcohol if your health care provider tells you not to drink.  If you drink alcohol, limit how much you have: ? 0-1 drink a day for women. ? 0-2 drinks a day for men.  Be aware of how much alcohol is in your drink. In the [place] one drink equals one typical bottle of beer (12 oz), one-half glass of wine (5 oz), or one shot of hard liquor (1 oz).  Do not use any products that contain nicotine or tobacco, such as cigarettes and e-cigarettes. If you need help quitting, ask your health care provider. Summary  Having a healthy lifestyle and getting preventive care can help to protect your health and wellness after age 65.  Screening and testing are the best way to find a health problem early and help you avoid having a fall. Early diagnosis and treatment give you the best chance for managing medical conditions that are more common for people who are older than age 65.  Falls are a major cause of broken bones and head injuries in people who are older than age 65. Take precautions to prevent a fall at home.  Work with your health care provider to learn what changes you can make to improve your health and wellness and to prevent falls. This information is not intended  to replace advice given to you by your health care provider. Make sure you discuss any questions you have with your health care provider. Document Revised: 05/02/2018 Document Reviewed: 11/22/2016 Elsevier Patient Education  2020 Elsevier Inc.  

## 2019-09-17 NOTE — Progress Notes (Signed)
Oscar Smith [place] Sommen 74 y.o.   Chief Complaint  Patient presents with  . Medical Management of Chronic Issues    19 m f/u     HISTORY OF PRESENT ILLNESS: This is a 74 y.o. male here for follow-up of chronic medical problems. 1.  Hypertension: On Norvasc 5 mg and lisinopril 40 mg daily.  Blood pressure readings at home within normal limits and acceptable values. 2.  Dyslipidemia: On Lipitor 10 mg daily 3.  Chronic vertigo, doing well.  Intermittent short-lived episodes. 4.  History of prostate cancer.  Sees urologist on a regular basis.  Doing well. 5.  Chronic sinus issues.  Needs ENT referral.  Has history of septal deviation. 6.  Possible sleep apnea.  Getting home sleep test next week. 7.  History of diverticulosis, internal hemorrhoids, and colonic polyps.  Last colonoscopy done in 2013.  Report reviewed with patient and wife. 8.  Intermittent episodes of fecal leakage.  Has had intestinal digestive issues for many years with intermittent diarrhea.  These symptoms go away completely when he spends time in his native [place].  Digestive issues start once he starts eating the food here in the [place]. 9.  Fully vaccinated against Covid.  HPI   Prior to Admission medications   Medication Sig Start Date End Date Taking? Authorizing Provider  amLODipine (NORVASC) 5 MG tablet Take 1 tablet (5 mg total) by mouth daily. 06/03/19 09/17/19 Yes Oscar Heinz, MD  aspirin EC 81 MG tablet Take 81 mg daily by mouth.   Yes [provider]  atorvastatin (LIPITOR) 10 MG tablet TAKE 1 TABLET ONCE DAILY. 08/05/19  Yes Oscar Smith, Oscar Bloomer, MD  diphenhydramine-acetaminophen (TYLENOL PM) 25-500 MG TABS tablet Take 1 tablet by mouth at bedtime as needed (for sleep).   Yes [provider]  fluticasone (FLONASE) 50 MCG/ACT nasal spray Place 2 sprays into both nostrils daily. 09/03/18  Yes Oscar Smith, Oscar Bloomer, MD  ipratropium (ATROVENT) 0.03 % nasal spray Place 2  sprays into both nostrils 2 (two) times daily. 06/25/17  Yes Oscar Honour, MD  lisinopril (ZESTRIL) 40 MG tablet Take 1 tablet (40 mg total) by mouth daily. 11/11/18  Yes Oscar Smith, Oscar Bloomer, MD  meclizine (ANTIVERT) 25 MG tablet Take 1 tablet (25 mg total) by mouth 3 (three) times daily as needed for up to 25 doses for dizziness. 05/06/19  Yes Oscar Dusky, MD  Multiple Vitamin (MULTIVITAMIN) tablet Take 1 tablet by mouth daily.   Yes [provider]  NON FORMULARY Take 1 capsule by mouth See admin instructions. Metagenics UltraFlora IB Daily Probiotic, Targeted Relief capsules- Take 1 capsule by mouth once a day as needed for diarrhea symptoms   Yes [provider]  Omega-3 Fatty Acids (FISH OIL OMEGA-3 PO) Take 1 capsule by mouth daily.    Yes [provider]  omeprazole (PRILOSEC) 20 MG capsule TAKE 1 CAPSULE 15-20 MINUTES BEFORE BREAKFAST. 08/05/19  Yes Oscar Pollen, MD    Allergies  Allergen Reactions  . Other   . Flomax [Tamsulosin Hcl] Other (See Comments)    "bad reaction" = jaw pain  . Latex Itching and Rash    Patient Active Problem List   Diagnosis Date Noted  . Essential hypertension 07/03/2018  . Pure hypercholesterolemia 07/03/2018  . Osteoarthritis of right hip 02/07/2018  . Lumbar radiculopathy 01/24/2017  . Enlarged prostate with urinary retention 12/25/2013  . Erectile dysfunction 12/25/2013  . Incomplete bladder emptying 08/27/2013  . Chronic sinusitis  02/20/2012  . Frontal mucocele 02/20/2012  . Nasal septal deviation 02/20/2012  . ADENOCARCINOMA, PROSTATE, GLEASON GRADE 6 12/21/2009  . GERD 11/12/2008    Past Medical History:  Diagnosis Date  . Adenocarcinoma of prostate [facility]) urologist-  dr Oscar Smith--  dx Nov 2011--  T1c, Gleason 3+3,  PSA 4.2 (post IMRT 03/2010   Low risk recurrence, Biochemical control,  last PSA 0.30 on 05-28-2014  . BPH (benign prostatic hyperplasia)   . Cancer (Oscar Smith)   . Diverticulitis   . ED  (erectile dysfunction) of organic origin   . GERD (gastroesophageal reflux disease)   . Hiatal hernia   . History of colon polyps    2002 and 2003 hyperplastic  . History of gastritis   . Hypertension   . Lower urinary tract symptoms (LUTS)   . Phimosis   . Sleep apnea    did not complete study.    . Wears hearing aid    bilateral    Past Surgical History:  Procedure Laterality Date  . ABDOMINAL HERNIA REPAIR    . CIRCUMCISION N/A 06/09/2015   Procedure: CIRCUMCISION ADULT WITH PENILE BLOCK;  Surgeon: Oscar Frock, MD;  Location: [facility];  Service: Urology;  Laterality: N/A;  . COLONOSCOPY  last one 11-15-2011  . ENDOVENOUS ABLATION SAPHENOUS VEIN W/ LASER Left 02/10/2016   endovenous laser ablation left greater saphenous vein and stab phlebectomy left leg by Oscar Jews MD  . HERNIA REPAIR N/A    Phreesia 07/11/2019  . INGUINAL HERNIA REPAIR Right 12-23-2009  . left shoulder surgery    . LIPOMA EXCISION N/A 12/01/2016   Procedure: EXCISION POSTERIOR NECK MASS;  Surgeon: Oscar Limbo, MD;  Location: [facility];  Service: Plastics;  Laterality: N/A;  . PROSTATE SURGERY  2009  . PROSTATE SURGERY    . SHOULDER ARTHROSCOPY WITH SUBACROMIAL DECOMPRESSION, ROTATOR CUFF REPAIR AND BICEP TENDON REPAIR Left 10-2014    Social History   Socioeconomic History  . Marital status: Married    Spouse name: Not on file  . Number of children: 0  . Years of education: BS  . Highest education level: Not on file  Occupational History  . Occupation: Development worker, community  . Occupation: Chief Financial Officer  Tobacco Use  . Smoking status: Former Smoker    Types: Cigarettes    Quit date: 09/04/1965    Years since quitting: 54.0  . Smokeless tobacco: Never Used  . Tobacco comment: quit around 74 yrs old.  Substance and Sexual Activity  . Alcohol use: Yes    Alcohol/week: 20.0 standard drinks    Types: 20 Standard drinks or equivalent per week    Comment: nightly  . Drug  use: No  . Sexual activity: Yes    Partners: Male    Birth control/protection: Post-menopausal  Other Topics Concern  . Not on file  Social History Narrative   ** Merged History Encounter **       Originally from The [place].   Trained as an Chief Financial Officer.    Married '75- 39 years, divorced; Married '86. No children.    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 Sculptor-life size figures    by commission ([place]  Sculpting work regularly.   Never smoking.   Alcohol: drinks1- 2 beers and 1-2 wines every night;    Learning the piano. Patient get regular exercise.   Wife is a Marine scientist.   Caffeine: 3/day  ADLs: drives; independent with ADLs   Advanced Directives: YES; FULL CODE; no prolonged measures.        Social Determinants of Health   Financial Resource Strain:   . Difficulty of Paying Living Expenses: Not on file  Food Insecurity:   . Worried About Charity fundraiser in the Last Year: Not on file  . Ran Out of Food in the Last Year: Not on file  Transportation Needs:   . Lack of Transportation (Medical): Not on file  . Lack of Transportation (Non-Medical): Not on file  Physical Activity:   . Days of Exercise per Week: Not on file  . Minutes of Exercise per Session: Not on file  Stress:   . Feeling of Stress : Not on file  Social Connections:   . Frequency of Communication with Friends and Family: Not on file  . Frequency of Social Gatherings with Friends and Family: Not on file  . Attends Religious Services: Not on file  . Active Member of Clubs or Organizations: Not on file  . Attends Archivist Meetings: Not on file  . Marital Status: Not on file  Intimate Partner Violence:   . Fear of Current or Ex-Partner: Not on file  . Emotionally Abused: Not on file  . Physically Abused: Not on file  . Sexually Abused: Not on file    Family History  Problem Relation Age of  Onset  . Stomach cancer Father   . Cancer Father 81       stomach versus colon cancer  . Pancreatic cancer Sister   . Cancer Sister 70       pancreatic cancer  . Cancer Brother        penile cancer     Review of Systems  Constitutional: Negative.  Negative for chills and fever.  HENT: Positive for congestion (Chronic sinus congestion). Negative for sore throat.   Respiratory: Negative.  Negative for cough and shortness of breath.   Cardiovascular: Negative.  Negative for chest pain and palpitations.  Gastrointestinal: Positive for diarrhea (Chronic). Negative for abdominal pain, blood in stool, melena, nausea and vomiting.       Occasional fecal leakage  Genitourinary: Negative.  Negative for dysuria and hematuria.  Musculoskeletal: Negative.  Negative for back pain, myalgias and neck pain.  Skin: Negative.  Negative for rash.  Neurological: Negative for dizziness and headaches.  All other systems reviewed and are negative.  Today's Vitals   09/17/19 0835  BP: 139/84  Pulse: 65  Temp: 98.1 F (36.7 C)  TempSrc: Temporal  SpO2: 99%  Weight: 189 lb 12.8 oz (86.1 kg)  Height: 5\' 10"  (1.778 m)   Body mass index is 27.23 kg/m.   Physical Exam Vitals reviewed.  Constitutional:      Appearance: Normal appearance.  HENT:     Head: Normocephalic.  Eyes:     Extraocular Movements: Extraocular movements intact.     Conjunctiva/sclera: Conjunctivae normal.     Pupils: Pupils are equal, round, and reactive to light.  Neck:     Vascular: No carotid bruit.  Cardiovascular:     Rate and Rhythm: Normal rate and regular rhythm.     Pulses: Normal pulses.     Heart sounds: Normal heart sounds.  Pulmonary:     Effort: Pulmonary effort is normal.     Breath sounds: Normal breath sounds.  Abdominal:     General: Bowel sounds are normal. There is no distension.     Palpations:  Abdomen is soft.     Tenderness: There is no abdominal tenderness.  Musculoskeletal:        General:  Normal range of motion.     Cervical back: Normal range of motion and neck supple. No tenderness.  Lymphadenopathy:     Cervical: No cervical adenopathy.  Skin:    General: Skin is warm and dry.     Capillary Refill: Capillary refill takes less than 2 seconds.  Neurological:     General: No focal deficit present.     Mental Status: He is alert and oriented to person, place, and time.  Psychiatric:        Mood and Affect: Mood normal.        Behavior: Behavior normal.      ASSESSMENT & PLAN: Oscar Smith was seen today for medical management of chronic issues.  Diagnoses and all orders for this visit:  Essential hypertension -     Comprehensive metabolic panel -     Lipid panel  Malignant neoplasm of prostate (HCC) -     PSA  Chronic sinus complaints -     Ambulatory referral to ENT  Fecal smearing -     Ambulatory referral to Gastroenterology  History of diverticulosis -     Ambulatory referral to Gastroenterology  History of colonic polyps -     Ambulatory referral to Gastroenterology  Dyslipidemia    Patient Instructions       If you have lab work done today you will be contacted with your lab results within the next 2 weeks.  If you have not heard from [place] then please contact us. The fastest way to get your results is to register for My Chart.   IF you received an x-ray today, you will receive an invoice from [facility]. Please contact [facility] at [phone] with questions or concerns regarding your invoice.   IF you received labwork today, you will receive an invoice from [facility]. Please contact [facility] at [phone] with questions or concerns regarding your invoice.   Our billing staff will not be able to assist you with questions regarding bills from these companies.  You will be contacted with the lab results as soon as they are available. The fastest way to get your results is to activate your My Chart account. Instructions are  located on the last page of this paperwork. If you have not heard from [place] regarding the results in 2 weeks, please contact this office.      Health Maintenance After Age 38 After age 85, you are at a higher risk for certain long-term diseases and infections as well as injuries from falls. Falls are a major cause of broken bones and head injuries in people who are older than age 50. Getting regular preventive care can help to keep you healthy and well. Preventive care includes getting regular testing and making lifestyle changes as recommended by your health care provider. Talk with your health care provider about:  Which screenings and tests you should have. A screening is a test that checks for a disease when you have no symptoms.  A diet and exercise plan that is right for you. What should I know about screenings and tests to prevent falls? Screening and testing are the best ways to find a health problem early. Early diagnosis and treatment give you the best chance of managing medical conditions that are common after age 43. Certain conditions and lifestyle choices may make you more likely to have  a fall. Your health care provider may recommend:  Regular vision checks. Poor vision and conditions such as cataracts can make you more likely to have a fall. If you wear glasses, make sure to get your prescription updated if your vision changes.  Medicine review. Work with your health care provider to regularly review all of the medicines you are taking, including over-the-counter medicines. Ask your health care provider about any side effects that may make you more likely to have a fall. Tell your health care provider if any medicines that you take make you feel dizzy or sleepy.  Osteoporosis screening. Osteoporosis is a condition that causes the bones to get weaker. This can make the bones weak and cause them to break more easily.  Blood pressure screening. Blood pressure changes and medicines to  control blood pressure can make you feel dizzy.  Strength and balance checks. Your health care provider may recommend certain tests to check your strength and balance while standing, walking, or changing positions.  Foot health exam. Foot pain and numbness, as well as not wearing proper footwear, can make you more likely to have a fall.  Depression screening. You may be more likely to have a fall if you have a fear of falling, feel emotionally low, or feel unable to do activities that you used to do.  Alcohol use screening. Using too much alcohol can affect your balance and may make you more likely to have a fall. What actions can I take to lower my risk of falls? General instructions  Talk with your health care provider about your risks for falling. Tell your health care provider if: ? You fall. Be sure to tell your health care provider about all falls, even ones that seem minor. ? You feel dizzy, sleepy, or off-balance.  Take over-the-counter and prescription medicines only as told by your health care provider. These include any supplements.  Eat a healthy diet and maintain a healthy weight. A healthy diet includes low-fat dairy products, low-fat (lean) meats, and fiber from whole grains, beans, and lots of fruits and vegetables. Home safety  Remove any tripping hazards, such as rugs, cords, and clutter.  Install safety equipment such as grab bars in bathrooms and safety rails on stairs.  Keep rooms and walkways well-lit. Activity   Follow a regular exercise program to stay fit. This will help you maintain your balance. Ask your health care provider what types of exercise are appropriate for you.  If you need a cane or walker, use it as recommended by your health care provider.  Wear supportive shoes that have nonskid soles. Lifestyle  Do not drink alcohol if your health care provider tells you not to drink.  If you drink alcohol, limit how much you have: ? 0-1 drink a day  for women. ? 0-2 drinks a day for men.  Be aware of how much alcohol is in your drink. In the [place] one drink equals one typical bottle of beer (12 oz), one-half glass of wine (5 oz), or one shot of hard liquor (1 oz).  Do not use any products that contain nicotine or tobacco, such as cigarettes and e-cigarettes. If you need help quitting, ask your health care provider. Summary  Having a healthy lifestyle and getting preventive care can help to protect your health and wellness after age 35.  Screening and testing are the best way to find a health problem early and help you avoid having a fall. Early diagnosis and treatment give  you the best chance for managing medical conditions that are more common for people who are older than age 55.  Falls are a major cause of broken bones and head injuries in people who are older than age 54. Take precautions to prevent a fall at home.  Work with your health care provider to learn what changes you can make to improve your health and wellness and to prevent falls. This information is not intended to replace advice given to you by your health care provider. Make sure you discuss any questions you have with your health care provider. Document Revised: 05/02/2018 Document Reviewed: 11/22/2016 Elsevier Patient Education  2020 Elsevier Inc.      Agustina Caroli, MD [facility]

## 2019-09-18 LAB — COMPREHENSIVE METABOLIC PANEL
ALT: 32 IU/L (ref 0–44)
AST: 22 IU/L (ref 0–40)
Albumin/Globulin Ratio: 2.1 (ref 1.2–2.2)
Albumin: 4.6 g/dL (ref 3.7–4.7)
Alkaline Phosphatase: 92 IU/L (ref 48–121)
BUN/Creatinine Ratio: 13 (ref 10–24)
BUN: 12 mg/dL (ref 8–27)
Bilirubin Total: 0.6 mg/dL (ref 0.0–1.2)
CO2: 24 mmol/L (ref 20–29)
Calcium: 9.4 mg/dL (ref 8.6–10.2)
Chloride: 98 mmol/L (ref 96–106)
Creatinine, Ser: 0.89 mg/dL (ref 0.76–1.27)
GFR calc Af Amer: 96 mL/min/{1.73_m2} (ref >59)
GFR calc non Af Amer: 83 mL/min/{1.73_m2} (ref >59)
Globulin, Total: 2.2 g/dL (ref 1.5–4.5)
Glucose: 83 mg/dL (ref 65–99)
Potassium: 4.3 mmol/L (ref 3.5–5.2)
Sodium: 135 mmol/L (ref 134–144)
Total Protein: 6.8 g/dL (ref 6.0–8.5)

## 2019-09-18 LAB — LIPID PANEL
Chol/HDL Ratio: 3.3 ratio (ref 0.0–5.0)
Cholesterol, Total: 153 mg/dL (ref 100–199)
HDL: 46 mg/dL (ref >39)
LDL Chol Calc (NIH): 90 mg/dL (ref 0–99)
Triglycerides: 88 mg/dL (ref 0–149)
VLDL Cholesterol Cal: 17 mg/dL (ref 5–40)

## 2019-09-18 LAB — PSA: Prostate Specific Ag, Serum: 0.2 ng/mL (ref 0.0–4.0)

## 2019-09-22 ENCOUNTER — Other Ambulatory Visit: Payer: Self-pay

## 2019-09-22 ENCOUNTER — Ambulatory Visit (HOSPITAL_BASED_OUTPATIENT_CLINIC_OR_DEPARTMENT_OTHER): Payer: Medicare Other | Attending: Cardiology | Admitting: Cardiovascular Disease

## 2019-09-22 ENCOUNTER — Encounter: Payer: Self-pay | Admitting: Gastroenterology

## 2019-09-22 DIAGNOSIS — I10 Essential (primary) hypertension: Secondary | ICD-10-CM | POA: Diagnosis not present

## 2019-09-22 DIAGNOSIS — R0681 Apnea, not elsewhere classified: Secondary | ICD-10-CM | POA: Diagnosis not present

## 2019-09-22 DIAGNOSIS — G4719 Other hypersomnia: Secondary | ICD-10-CM | POA: Diagnosis present

## 2019-09-22 DIAGNOSIS — R0683 Snoring: Secondary | ICD-10-CM | POA: Diagnosis not present

## 2019-09-22 DIAGNOSIS — G4710 Hypersomnia, unspecified: Secondary | ICD-10-CM

## 2019-10-02 ENCOUNTER — Encounter (INDEPENDENT_AMBULATORY_CARE_PROVIDER_SITE_OTHER): Payer: Self-pay | Admitting: Otolaryngology

## 2019-10-02 ENCOUNTER — Other Ambulatory Visit (INDEPENDENT_AMBULATORY_CARE_PROVIDER_SITE_OTHER): Payer: Self-pay

## 2019-10-02 ENCOUNTER — Other Ambulatory Visit: Payer: Self-pay

## 2019-10-02 ENCOUNTER — Ambulatory Visit (INDEPENDENT_AMBULATORY_CARE_PROVIDER_SITE_OTHER): Payer: Medicare Other | Admitting: Otolaryngology

## 2019-10-02 VITALS — Temp 97.7°F

## 2019-10-02 DIAGNOSIS — J329 Chronic sinusitis, unspecified: Secondary | ICD-10-CM | POA: Diagnosis not present

## 2019-10-02 DIAGNOSIS — H903 Sensorineural hearing loss, bilateral: Secondary | ICD-10-CM

## 2019-10-02 DIAGNOSIS — J342 Deviated nasal septum: Secondary | ICD-10-CM | POA: Diagnosis not present

## 2019-10-02 DIAGNOSIS — H6123 Impacted cerumen, bilateral: Secondary | ICD-10-CM | POA: Diagnosis not present

## 2019-10-02 NOTE — Progress Notes (Signed)
HPI: Oscar Smith is a 74 y.o. male who presents is referred by his PCP for evaluation of chronic sinus issues.  This is much worse at night when he sleeps.  His nose obstruction wakes him up frequently.  He has had a previous CT scan of the sinuses performed 7 years ago that showed only deviated septum but some chronic right sided ethmoid and frontal sinus disease.  He complains of chronic drainage as well as a lot of crusting and scabbing in his nose. He uses Flonase every night when he goes to bed and sometimes uses it when he wakes up.  He does not have quite as much problems during the daytime. He also wears bilateral hearing aids and used to be followed by Dr. Thornell Mule..  Past Medical History:  Diagnosis Date  . Adenocarcinoma of prostate [facility]) urologist-  dr Tresa Moore--  dx Nov 2011--  T1c, Gleason 3+3,  PSA 4.2 (post IMRT 03/2010   Low risk recurrence, Biochemical control,  last PSA 0.30 on 05-28-2014  . BPH (benign prostatic hyperplasia)   . Cancer ([place])   . Diverticulitis   . ED (erectile dysfunction) of organic origin   . GERD (gastroesophageal reflux disease)   . Hiatal hernia   . History of colon polyps    2002 and 2003 hyperplastic  . History of gastritis   . Hypertension   . Lower urinary tract symptoms (LUTS)   . Phimosis   . Sleep apnea    did not complete study.    . Wears hearing aid    bilateral   Past Surgical History:  Procedure Laterality Date  . ABDOMINAL HERNIA REPAIR    . CIRCUMCISION N/A 06/09/2015   Procedure: CIRCUMCISION ADULT WITH PENILE BLOCK;  Surgeon: Alexis Frock, MD;  Location: [facility];  Service: Urology;  Laterality: N/A;  . COLONOSCOPY  last one 11-15-2011  . ENDOVENOUS ABLATION SAPHENOUS VEIN W/ LASER Left 02/10/2016   endovenous laser ablation left greater saphenous vein and stab phlebectomy left leg by Curt Jews MD  . HERNIA REPAIR N/A    Phreesia 07/11/2019  . INGUINAL HERNIA REPAIR Right 12-23-2009  . left  shoulder surgery    . LIPOMA EXCISION N/A 12/01/2016   Procedure: EXCISION POSTERIOR NECK MASS;  Surgeon: Irene Limbo, MD;  Location: [place];  Service: Plastics;  Laterality: N/A;  . PROSTATE SURGERY  2009  . PROSTATE SURGERY    . SHOULDER ARTHROSCOPY WITH SUBACROMIAL DECOMPRESSION, ROTATOR CUFF REPAIR AND BICEP TENDON REPAIR Left 10-2014   Social History   Socioeconomic History  . Marital status: Married    Spouse name: Not on file  . Number of children: 0  . Years of education: BS  . Highest education level: Not on file  Occupational History  . Occupation: Development worker, community  . Occupation: Chief Financial Officer  Tobacco Use  . Smoking status: Former Smoker    Packs/day: 0.50    Years: 2.00    Pack years: 1.00    Types: Cigarettes    Quit date: 09/04/1965    Years since quitting: 54.1  . Smokeless tobacco: Never Used  . Tobacco comment: quit around 74 yrs old.  Substance and Sexual Activity  . Alcohol use: Yes    Alcohol/week: 20.0 standard drinks    Types: 20 Standard drinks or equivalent per week    Comment: nightly  . Drug use: No  . Sexual activity: Yes    Partners: Male    Birth control/protection: Post-menopausal  Other Topics Concern  . Not on file  Social History Narrative   ** Merged History Encounter **       Originally from [place].   Trained as an Chief Financial Officer.    Married '75- 12 years, divorced; Married '86. No children.    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 Sculptor-life size figures    by commission (see work in [place]  Sculpting work regularly.   Never smoking.   Alcohol: drinks1- 2 beers and 1-2 wines every night;    Learning the piano. Patient get regular exercise.   Wife is a Marine scientist.   Caffeine: 3/day    ADLs: drives; independent with ADLs   Advanced Directives: YES; FULL CODE; no prolonged measures.        Social Determinants of Health   Financial  Resource Strain:   . Difficulty of Paying Living Expenses: Not on file  Food Insecurity:   . Worried About Charity fundraiser in the Last Year: Not on file  . Ran Out of Food in the Last Year: Not on file  Transportation Needs:   . Lack of Transportation (Medical): Not on file  . Lack of Transportation (Non-Medical): Not on file  Physical Activity:   . Days of Exercise per Week: Not on file  . Minutes of Exercise per Session: Not on file  Stress:   . Feeling of Stress : Not on file  Social Connections:   . Frequency of Communication with Friends and Family: Not on file  . Frequency of Social Gatherings with Friends and Family: Not on file  . Attends Religious Services: Not on file  . Active Member of Clubs or Organizations: Not on file  . Attends Archivist Meetings: Not on file  . Marital Status: Not on file   Family History  Problem Relation Age of Onset  . Stomach cancer Father   . Cancer Father 56       stomach versus colon cancer  . Pancreatic cancer Sister   . Cancer Sister 102       pancreatic cancer  . Cancer Brother        penile cancer   Allergies  Allergen Reactions  . Other   . Flomax [Tamsulosin Hcl] Other (See Comments)    "bad reaction" = jaw pain  . Latex Itching and Rash   Prior to Admission medications   Medication Sig Start Date End Date Taking? Authorizing Provider  aspirin EC 81 MG tablet Take 81 mg daily by mouth.   Yes [provider]  atorvastatin (LIPITOR) 10 MG tablet TAKE 1 TABLET ONCE DAILY. 08/05/19  Yes Sagardia, Ines Bloomer, MD  diphenhydramine-acetaminophen (TYLENOL PM) 25-500 MG TABS tablet Take 1 tablet by mouth at bedtime as needed (for sleep).   Yes [provider]  fluticasone (FLONASE) 50 MCG/ACT nasal spray Place 2 sprays into both nostrils daily. 09/03/18  Yes Sagardia, Ines Bloomer, MD  ipratropium (ATROVENT) 0.03 % nasal spray Place 2 sprays into both nostrils 2 (two) times daily. 06/25/17  Yes Wardell Honour, MD  lisinopril (ZESTRIL) 40 MG tablet Take 1 tablet (40 mg total) by mouth daily. 11/11/18  Yes Sagardia, Ines Bloomer, MD  meclizine (ANTIVERT) 25 MG tablet Take 1 tablet (25 mg total) by mouth 3 (three) times daily as needed for up to 25 doses for dizziness. 05/06/19  Yes Wyvonnia Dusky, MD  Multiple Vitamin (MULTIVITAMIN) tablet Take  1 tablet by mouth daily.   Yes [provider]  NON FORMULARY Take 1 capsule by mouth See admin instructions. Metagenics UltraFlora IB Daily Probiotic, Targeted Relief capsules- Take 1 capsule by mouth once a day as needed for diarrhea symptoms   Yes [provider]  Omega-3 Fatty Acids (FISH OIL OMEGA-3 PO) Take 1 capsule by mouth daily.    Yes [provider]  omeprazole (PRILOSEC) 20 MG capsule TAKE 1 CAPSULE 15-20 MINUTES BEFORE BREAKFAST. 08/05/19  Yes Sagardia, Ines Bloomer, MD  amLODipine (NORVASC) 5 MG tablet Take 1 tablet (5 mg total) by mouth daily. 06/03/19 09/17/19  Donato Heinz, MD     Positive ROS: Otherwise negative  All other systems have been reviewed and were otherwise negative with the exception of those mentioned in the HPI and as above.  Physical Exam: Constitutional: Alert, well-appearing, no acute distress Ears: External ears without lesions or tenderness.  He has bilateral hearing aids with wax buildup in both ear canals that was cleaned in the office using curettes and forceps.  TMs were clear bilaterally. Nasal: External nose without lesions. Septum is mildly deviated to the left with a large amount of scabbing and crusting in both nasal cavities.  There is no polyps or obstructing lesions noted within the nasal cavity otherwise.. Clear nasal passages Oral: Lips and gums without lesions. Tongue and palate mucosa without lesions. Posterior oropharynx clear. Neck: No palpable adenopathy or masses Respiratory: Breathing comfortably  Skin: No facial/neck lesions or rash noted.  Cerumen impaction  removal  Date/Time: 10/02/2019 1:34 PM Performed by: Rozetta Nunnery, MD Authorized by: Rozetta Nunnery, MD   Consent:    Consent obtained:  Verbal   Consent given by:  Patient   Risks discussed:  Pain and bleeding Procedure details:    Location:  L ear and R ear   Procedure type: curette and forceps   Post-procedure details:    Inspection:  TM intact and canal normal   Hearing quality:  Improved   Patient tolerance of procedure:  Tolerated well, no immediate complications Comments:     TMs are clear bilaterally.    Assessment: History of chronic sinus symptoms with moderate severe septal deviation and nasal obstruction which is worse at night. Bilateral hearing loss with cerumen buildup.  Plan: We will plan on obtaining a CT scan to better evaluate his sinuses prior to any surgical intervention.  He has been discussed the surgery previously and is ready to have surgery performed. He will follow-up following the CT scan of the sinuses to discuss surgical intervention. I discussed with him concerning continue use of nasal steroid spray 2 sprays each nostril at night and gave him a prescription for Nasacort to compare to the Flonase.  Recommended use of saline irrigation during the daytime. He has tried Breathe Right strips in the past but the adhesive irritated his skin.  I suggested he could try air max which is an internal device to help keep the nostrils open. He will follow-up following the CT scan.   Radene Journey, MD   CC:

## 2019-10-06 DIAGNOSIS — Z1152 Encounter for screening for COVID-19: Secondary | ICD-10-CM | POA: Diagnosis not present

## 2019-10-08 ENCOUNTER — Encounter (HOSPITAL_BASED_OUTPATIENT_CLINIC_OR_DEPARTMENT_OTHER): Payer: Self-pay | Admitting: Cardiovascular Disease

## 2019-10-08 NOTE — Procedures (Signed)
    Patient Name: Oscar Smith Study Date: 09/22/2019 Gender: Male D.O.B: 1945/05/21 Age (years): 74 Referring Provider: Oswaldo Milian Height (inches): 72 Interpreting Physician: Shelva Majestic MD, ABSM Weight (lbs): 186 RPSGT: Jacolyn Reedy BMI: 27 MRN: [id number] Neck Size:   CLINICAL INFORMATION Sleep Study Type: HST  Indication for sleep study: snoring, witnessed apnea, daytime fatigue  Epworth Sleepiness Score: 12  SLEEP STUDY TECHNIQUE A multi-channel overnight portable sleep study was performed. The channels recorded were: nasal airflow, thoracic respiratory movement, and oxygen saturation with a pulse oximetry. Snoring was also monitored.  MEDICATIONS Patient self administered medications include: N/A.  SLEEP ARCHITECTURE Patient was studied for 400.4 minutes. The sleep efficiency was 100.0 % and the patient was supine for 69%. The arousal index was 0.0 per hour.  RESPIRATORY PARAMETERS The overall AHI was 2.2 per hour, with a central apnea index of 0.0 per hour.  The oxygen nadir was 89% during sleep.  CARDIAC DATA Mean heart rate during sleep was 63.4 bpm.  IMPRESSIONS - No significant obstructive sleep apnea occurred during this study (AHI = 2.2/h). - No significant central sleep apnea occurred during this study (CAI = 0.0/h). - Mild oxygen desaturation was noted during this study (Min O2 = 89%). - Patient snored 1.3% during the sleep.  DIAGNOSIS - Normal study - Daytime sleepiness  RECOMMENDATIONS - At present there is no indication for CPAP therapy. - Avoid alcohol, sedatives and other CNS depressants that may worsen sleep apnea and disrupt normal sleep architecture. - Sleep hygiene with adequate sleep duration of at least 7 - 8 hours should be reviewed to assess factors that may improve sleep quality. - Weight management and regular exercise should be initiated or continued. - If patient continues to have progressive,  consider a future follow-up.  -  [Electronically signed] 10/08/2019 11:08 AM  Shelva Majestic MD, [facility], [place], American Board of Sleep Medicine   NPI: [id number] [place] PH: [phone]   FX: [phone] [place]

## 2019-10-14 ENCOUNTER — Telehealth: Payer: Self-pay | Admitting: *Deleted

## 2019-10-14 NOTE — Telephone Encounter (Signed)
Patient notified of HST results. He requested for a copy to be mailed to his home address.

## 2019-10-16 ENCOUNTER — Ambulatory Visit
Admission: RE | Admit: 2019-10-16 | Discharge: 2019-10-16 | Disposition: A | Discharge status: Home / Self Care | Payer: Medicare Other | Source: Ambulatory Visit | Attending: Otolaryngology | Admitting: Otolaryngology

## 2019-10-16 DIAGNOSIS — J320 Chronic maxillary sinusitis: Secondary | ICD-10-CM | POA: Diagnosis not present

## 2019-10-16 DIAGNOSIS — J321 Chronic frontal sinusitis: Secondary | ICD-10-CM | POA: Diagnosis not present

## 2019-10-16 DIAGNOSIS — J3489 Other specified disorders of nose and nasal sinuses: Secondary | ICD-10-CM | POA: Diagnosis not present

## 2019-10-16 DIAGNOSIS — J329 Chronic sinusitis, unspecified: Secondary | ICD-10-CM

## 2019-10-16 DIAGNOSIS — J322 Chronic ethmoidal sinusitis: Secondary | ICD-10-CM | POA: Diagnosis not present

## 2019-10-20 ENCOUNTER — Ambulatory Visit: Payer: Medicare Other | Admitting: Gastroenterology

## 2019-10-22 ENCOUNTER — Encounter (INDEPENDENT_AMBULATORY_CARE_PROVIDER_SITE_OTHER): Payer: Self-pay | Admitting: Otolaryngology

## 2019-10-22 ENCOUNTER — Ambulatory Visit (INDEPENDENT_AMBULATORY_CARE_PROVIDER_SITE_OTHER): Payer: Medicare Other | Admitting: Otolaryngology

## 2019-10-22 ENCOUNTER — Other Ambulatory Visit: Payer: Self-pay

## 2019-10-22 VITALS — Temp 96.6°F

## 2019-10-22 DIAGNOSIS — J342 Deviated nasal septum: Secondary | ICD-10-CM | POA: Diagnosis not present

## 2019-10-22 NOTE — Progress Notes (Signed)
HPI: Linda Biehn Der Sommen is a 74 y.o. male who returns today for evaluation of nasal sinus complaints.  He has been recommended surgery a number of years ago but declined to have surgery at that time.  He was having increasing trouble breathing through his nose despite use of Flonase and presented a couple weeks ago to discuss surgery.  He presents today to review the CT scan that was performed.  He has also been using Nasacort instead of Flonase.  He feels like his breathing is doing much better since switching to the Nasacort.  He is not having as much trouble breathing.  He has occasional headaches but no chronic headaches. I reviewed the CT scan with him and his wife in the office today.  This showed a rather severe septal deviation to the left with left ethmoid and left frontal sinus opacification.  This of opacification was present 7 years ago without significant change..  Past Medical History:  Diagnosis Date  . Adenocarcinoma of prostate [facility]) urologist-  dr Tresa Moore--  dx Nov 2011--  T1c, Gleason 3+3,  PSA 4.2 (post IMRT 03/2010   Low risk recurrence, Biochemical control,  last PSA 0.30 on 05-28-2014  . BPH (benign prostatic hyperplasia)   . Cancer ([place])   . Diverticulitis   . ED (erectile dysfunction) of organic origin   . GERD (gastroesophageal reflux disease)   . Hiatal hernia   . History of colon polyps    2002 and 2003 hyperplastic  . History of gastritis   . Hypertension   . Lower urinary tract symptoms (LUTS)   . Phimosis   . Sleep apnea    did not complete study.    . Wears hearing aid    bilateral   Past Surgical History:  Procedure Laterality Date  . ABDOMINAL HERNIA REPAIR    . CIRCUMCISION N/A 06/09/2015   Procedure: CIRCUMCISION ADULT WITH PENILE BLOCK;  Surgeon: Alexis Frock, MD;  Location: [facility];  Service: Urology;  Laterality: N/A;  . COLONOSCOPY  last one 11-15-2011  . ENDOVENOUS ABLATION SAPHENOUS VEIN W/ LASER Left 02/10/2016    endovenous laser ablation left greater saphenous vein and stab phlebectomy left leg by Curt Jews MD  . HERNIA REPAIR N/A    Phreesia 07/11/2019  . INGUINAL HERNIA REPAIR Right 12-23-2009  . left shoulder surgery    . LIPOMA EXCISION N/A 12/01/2016   Procedure: EXCISION POSTERIOR NECK MASS;  Surgeon: Irene Limbo, MD;  Location: [place];  Service: Plastics;  Laterality: N/A;  . PROSTATE SURGERY  2009  . PROSTATE SURGERY    . SHOULDER ARTHROSCOPY WITH SUBACROMIAL DECOMPRESSION, ROTATOR CUFF REPAIR AND BICEP TENDON REPAIR Left 10-2014   Social History   Socioeconomic History  . Marital status: Married    Spouse name: Not on file  . Number of children: 0  . Years of education: BS  . Highest education level: Not on file  Occupational History  . Occupation: Development worker, community  . Occupation: Chief Financial Officer  Tobacco Use  . Smoking status: Former Smoker    Packs/day: 0.50    Years: 2.00    Pack years: 1.00    Types: Cigarettes    Quit date: 09/04/1965    Years since quitting: 54.1  . Smokeless tobacco: Never Used  . Tobacco comment: quit around 74 yrs old.  Substance and Sexual Activity  . Alcohol use: Yes    Alcohol/week: 20.0 standard drinks    Types: 20 Standard drinks or equivalent per  week    Comment: nightly  . Drug use: No  . Sexual activity: Yes    Partners: Male    Birth control/protection: Post-menopausal  Other Topics Concern  . Not on file  Social History Narrative   ** Merged History Encounter **       Originally from [place].   Trained as an Chief Financial Officer.    Married '75- 47 years, divorced; Married '86. No children.    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 Sculptor-life size figures    by commission (see work in [place]  Sculpting work regularly.   Never smoking.   Alcohol: drinks1- 2 beers and 1-2 wines every night;    Learning the piano. Patient get regular exercise.    Wife is a Marine scientist.   Caffeine: 3/day    ADLs: drives; independent with ADLs   Advanced Directives: YES; FULL CODE; no prolonged measures.        Social Determinants of Health   Financial Resource Strain:   . Difficulty of Paying Living Expenses: Not on file  Food Insecurity:   . Worried About Charity fundraiser in the Last Year: Not on file  . Ran Out of Food in the Last Year: Not on file  Transportation Needs:   . Lack of Transportation (Medical): Not on file  . Lack of Transportation (Non-Medical): Not on file  Physical Activity:   . Days of Exercise per Week: Not on file  . Minutes of Exercise per Session: Not on file  Stress:   . Feeling of Stress : Not on file  Social Connections:   . Frequency of Communication with Friends and Family: Not on file  . Frequency of Social Gatherings with Friends and Family: Not on file  . Attends Religious Services: Not on file  . Active Member of Clubs or Organizations: Not on file  . Attends Archivist Meetings: Not on file  . Marital Status: Not on file   Family History  Problem Relation Age of Onset  . Stomach cancer Father   . Cancer Father 23       stomach versus colon cancer  . Pancreatic cancer Sister   . Cancer Sister 45       pancreatic cancer  . Cancer Brother        penile cancer   Allergies  Allergen Reactions  . Other   . Flomax [Tamsulosin Hcl] Other (See Comments)    "bad reaction" = jaw pain  . Latex Itching and Rash   Prior to Admission medications   Medication Sig Start Date End Date Taking? Authorizing Provider  aspirin EC 81 MG tablet Take 81 mg daily by mouth.   Yes [provider]  atorvastatin (LIPITOR) 10 MG tablet TAKE 1 TABLET ONCE DAILY. 08/05/19  Yes Sagardia, Ines Bloomer, MD  diphenhydramine-acetaminophen (TYLENOL PM) 25-500 MG TABS tablet Take 1 tablet by mouth at bedtime as needed (for sleep).   Yes [provider]  fluticasone (FLONASE) 50 MCG/ACT nasal spray Place 2  sprays into both nostrils daily. 09/03/18  Yes Sagardia, Ines Bloomer, MD  ipratropium (ATROVENT) 0.03 % nasal spray Place 2 sprays into both nostrils 2 (two) times daily. 06/25/17  Yes Wardell Honour, MD  lisinopril (ZESTRIL) 40 MG tablet Take 1 tablet (40 mg total) by mouth daily. 11/11/18  Yes Sagardia, Ines Bloomer, MD  meclizine (ANTIVERT) 25 MG tablet Take 1 tablet (25 mg total)  by mouth 3 (three) times daily as needed for up to 25 doses for dizziness. 05/06/19  Yes Wyvonnia Dusky, MD  Multiple Vitamin (MULTIVITAMIN) tablet Take 1 tablet by mouth daily.   Yes [provider]  NON FORMULARY Take 1 capsule by mouth See admin instructions. Metagenics UltraFlora IB Daily Probiotic, Targeted Relief capsules- Take 1 capsule by mouth once a day as needed for diarrhea symptoms   Yes [provider]  Omega-3 Fatty Acids (FISH OIL OMEGA-3 PO) Take 1 capsule by mouth daily.    Yes [provider]  omeprazole (PRILOSEC) 20 MG capsule TAKE 1 CAPSULE 15-20 MINUTES BEFORE BREAKFAST. 08/05/19  Yes Sagardia, Ines Bloomer, MD  amLODipine (NORVASC) 5 MG tablet Take 1 tablet (5 mg total) by mouth daily. 06/03/19 09/17/19  Donato Heinz, MD     Positive ROS: Otherwise negative  All other systems have been reviewed and were otherwise negative with the exception of those mentioned in the HPI and as above.  Physical Exam: Constitutional: Alert, well-appearing, no acute distress Ears: External ears without lesions or tenderness. Ear canals are clear bilaterally with intact, clear TMs.  Nasal: External nose without lesions. Septum deviated to the left.  Middle meatus region was clear with no active mucopurulent discharge noted.  No polyps noted..  Oral: Lips and gums without lesions. Tongue and palate mucosa without lesions. Posterior oropharynx clear. Neck: No palpable adenopathy or masses Respiratory: Breathing comfortably  Skin: No facial/neck lesions or rash  noted.  Procedures  Assessment: Septal deviation with chronic opacification of the left ethmoid and left frontal sinus.  Plan: Reviewed with him concerning surgical options.  Since he has been doing so much better on use of the Nasacort recommended observation for now and if he has recurrent problems with trouble breathing or pain or pressure on the left forehead area or left eye pain would recommend proceeding with surgery. He will call us back if he decides to have surgery performed. He is otherwise healthy although he takes medication for hypertension and cholesterol.  No heart disease.   Radene Journey, MD

## 2019-10-29 ENCOUNTER — Other Ambulatory Visit: Payer: Self-pay

## 2019-10-29 ENCOUNTER — Ambulatory Visit (INDEPENDENT_AMBULATORY_CARE_PROVIDER_SITE_OTHER): Payer: Medicare Other | Admitting: Emergency Medicine

## 2019-10-29 DIAGNOSIS — Z23 Encounter for immunization: Secondary | ICD-10-CM

## 2019-11-03 ENCOUNTER — Other Ambulatory Visit: Payer: Self-pay | Admitting: Emergency Medicine

## 2019-11-03 NOTE — Telephone Encounter (Signed)
Requested Prescriptions  Pending Prescriptions Disp Refills  . omeprazole (PRILOSEC) 20 MG capsule [Pharmacy Med Name: OMEPRAZOLE DR 20 MG CAPSULE] 90 capsule 0    Sig: TAKE 1 CAPSULE 15-20 MINUTES BEFORE BREAKFAST.     Gastroenterology: Proton Pump Inhibitors Passed - 11/03/2019  8:32 AM      Passed - Valid encounter within last 12 months    Recent Outpatient Visits          5 days ago Encounter for immunization   Primary Care at [facility], Ines Bloomer, MD   1 month ago Essential hypertension   Primary Care at [place], Ines Bloomer, MD   3 months ago Medicare annual wellness visit, subsequent   Primary Care at [facility], Ines Bloomer, MD   6 months ago Vertigo   Primary Care at [facility], Ranell Patrick, MD   7 months ago Fatigue, unspecified type   Primary Care at [facility], Ines Bloomer, MD      Future Appointments            In 4 weeks Donato Heinz, MD [place], [place]   In 4 months Sagardia, Ines Bloomer, MD Primary Care at [place], [facility]

## 2019-11-10 ENCOUNTER — Other Ambulatory Visit: Payer: Self-pay | Admitting: Emergency Medicine

## 2019-11-25 ENCOUNTER — Other Ambulatory Visit: Payer: Medicare Other

## 2019-11-25 ENCOUNTER — Encounter: Payer: Self-pay | Admitting: Gastroenterology

## 2019-11-25 ENCOUNTER — Ambulatory Visit: Payer: Medicare Other | Admitting: Gastroenterology

## 2019-11-25 VITALS — BP 128/68 | HR 68 | Ht 68.9 in | Wt 191.0 lb

## 2019-11-25 DIAGNOSIS — R151 Fecal smearing: Secondary | ICD-10-CM

## 2019-11-25 DIAGNOSIS — R197 Diarrhea, unspecified: Secondary | ICD-10-CM

## 2019-11-25 NOTE — Patient Instructions (Signed)
Your provider has requested that you go to the basement level for lab work before leaving today. Press "B" on the elevator. The lab is located at the first door on the left as you exit the elevator.  You can take imodium twice daily as needed for diarrhea symptoms.   Start a food diary to see what foods you need to avoid.    Kegel Exercises  Kegel exercises can help strengthen your pelvic floor muscles. The pelvic floor is a group of muscles that support your rectum, small intestine, and bladder. In females, pelvic floor muscles also help support the womb (uterus). These muscles help you control the flow of urine and stool. Kegel exercises are painless and simple, and they do not require any equipment. Your provider may suggest Kegel exercises to:  Improve bladder and bowel control.  Improve sexual response.  Improve weak pelvic floor muscles after surgery to remove the uterus (hysterectomy) or pregnancy (females).  Improve weak pelvic floor muscles after prostate gland removal or surgery (males). Kegel exercises involve squeezing your pelvic floor muscles, which are the same muscles you squeeze when you try to stop the flow of urine or keep from passing gas. The exercises can be done while sitting, standing, or lying down, but it is best to vary your position. Exercises How to do Kegel exercises: 1. Squeeze your pelvic floor muscles tight. You should feel a tight lift in your rectal area. If you are a male, you should also feel a tightness in your vaginal area. Keep your stomach, buttocks, and legs relaxed. 2. Hold the muscles tight for up to 10 seconds. 3. Breathe normally. 4. Relax your muscles. 5. Repeat as told by your health care provider. Repeat this exercise daily as told by your health care provider. Continue to do this exercise for at least 4-6 weeks, or for as long as told by your health care provider. You may be referred to a physical therapist who can help you learn more  about how to do Kegel exercises. Depending on your condition, your health care provider may recommend:  Varying how long you squeeze your muscles.  Doing several sets of exercises every day.  Doing exercises for several weeks.  Making Kegel exercises a part of your regular exercise routine. This information is not intended to replace advice given to you by your health care provider. Make sure you discuss any questions you have with your health care provider. Document Revised: 08/29/2017 Document Reviewed: 08/29/2017 Elsevier Patient Education  2020 [place] you for choosing me and [facility].  Pricilla Riffle. Dagoberto Ligas., MD., [place]

## 2019-11-25 NOTE — Progress Notes (Signed)
History of Present Illness: This is a 74 year old male referred by Horald Pollen, * MD for the evaluation of alternating bowel habits, fecal urgency and fecal incontinence.  He is accompanied by his wife who is a retired Marine scientist.  He was followed by Dr. Juanita Craver for several years for GERD and colon cancer screening. Colonoscopy by Dr. Collene Mares in 10/2011 showed 3 small hyperplastic colon polyps, mild sigmoid diverticulosis, internal hemorrhoids and a very redundant colon. Cologuard in 07/2017 was negative.  He informs me that Dr. Collene Mares recommended no future screening colonoscopies or Cologuard's given his age.  Prior to seeing Dr. Collene Mares received his GI care with me and decided to return to see me for his current problems. He relates problems with alternating diarrhea and constipation. Diarrhea has been problematic for many years and clearly tied to certain foods but occurs at other times for unclear reasons.  Diarrhea has worsened over the past year.  He has noted increased urgency with diarrhea for the past 6 months.  He has noted episodes of small amounts of liquid fecal incontinence in between bowel movements for about 6 months.  Despite clearly having diarrhea from eggs milk products and chocolate he continues to eat these foods. CMP, TSH and CBC from a few months ago were normal. Denies weight loss, abdominal pain, change in stool caliber, melena, hematochezia, nausea, vomiting, dysphagia, reflux symptoms, chest pain.     Allergies  Allergen Reactions  . Other   . Flomax [Tamsulosin Hcl] Other (See Comments)    "bad reaction" = jaw pain  . Latex Itching and Rash   Outpatient Medications Prior to Visit  Medication Sig Dispense Refill  . aspirin EC 81 MG tablet Take 81 mg daily by mouth.    Marland Kitchen atorvastatin (LIPITOR) 10 MG tablet TAKE 1 TABLET ONCE DAILY. 90 tablet 0  . diphenhydramine-acetaminophen (TYLENOL PM) 25-500 MG TABS tablet Take 1 tablet by mouth at bedtime as needed (for sleep).     . fluticasone (FLONASE) 50 MCG/ACT nasal spray Place 2 sprays into both nostrils daily. 48 g 3  . lisinopril (ZESTRIL) 40 MG tablet TAKE 1 TABLET BY MOUTH DAILY. 90 tablet 0  . meclizine (ANTIVERT) 25 MG tablet Take 1 tablet (25 mg total) by mouth 3 (three) times daily as needed for up to 25 doses for dizziness. 25 tablet 0  . Multiple Vitamin (MULTIVITAMIN) tablet Take 1 tablet by mouth daily.    . NON FORMULARY Take 1 capsule by mouth See admin instructions. Metagenics UltraFlora IB Daily Probiotic, Targeted Relief capsules- Take 1 capsule by mouth once a day as needed for diarrhea symptoms    . Omega-3 Fatty Acids (FISH OIL OMEGA-3 PO) Take 1 capsule by mouth daily.     Marland Kitchen omeprazole (PRILOSEC) 20 MG capsule TAKE 1 CAPSULE 15-20 MINUTES BEFORE BREAKFAST. 90 capsule 0  . ipratropium (ATROVENT) 0.03 % nasal spray Place 2 sprays into both nostrils 2 (two) times daily. 30 mL 0  . amLODipine (NORVASC) 5 MG tablet Take 1 tablet (5 mg total) by mouth daily. 90 tablet 3   No facility-administered medications prior to visit.   Past Medical History:  Diagnosis Date  . Adenocarcinoma of prostate [facility]) urologist-  dr Tresa Moore--  dx Nov 2011--  T1c, Gleason 3+3,  PSA 4.2 (post IMRT 03/2010   Low risk recurrence, Biochemical control,  last PSA 0.30 on 05-28-2014  . BPH (benign prostatic hyperplasia)   . Cancer ([place])   . Diverticulitis   .  ED (erectile dysfunction) of organic origin   . GERD (gastroesophageal reflux disease)   . Hiatal hernia   . History of colon polyps    2002 and 2003 hyperplastic  . History of gastritis   . Hypertension   . Lower urinary tract symptoms (LUTS)   . Phimosis   . Sleep apnea    did not complete study.    . Wears hearing aid    bilateral   Past Surgical History:  Procedure Laterality Date  . ABDOMINAL HERNIA REPAIR    . CIRCUMCISION N/A 06/09/2015   Procedure: CIRCUMCISION ADULT WITH PENILE BLOCK;  Surgeon: Alexis Frock, MD;  Location: [facility];  Service: Urology;  Laterality: N/A;  . COLONOSCOPY  last one 11-15-2011  . ENDOVENOUS ABLATION SAPHENOUS VEIN W/ LASER Left 02/10/2016   endovenous laser ablation left greater saphenous vein and stab phlebectomy left leg by Curt Jews MD  . HERNIA REPAIR N/A    [employer] 07/11/2019  . INGUINAL HERNIA REPAIR Right 12-23-2009  . left shoulder surgery    . LIPOMA EXCISION N/A 12/01/2016   Procedure: EXCISION POSTERIOR NECK MASS;  Surgeon: Irene Limbo, MD;  Location: [place];  Service: Plastics;  Laterality: N/A;  . PROSTATE SURGERY  2009  . PROSTATE SURGERY    . SHOULDER ARTHROSCOPY WITH SUBACROMIAL DECOMPRESSION, ROTATOR CUFF REPAIR AND BICEP TENDON REPAIR Left 10-2014   Social History   Socioeconomic History  . Marital status: Married    Spouse name: Not on file  . Number of children: 0  . Years of education: BS  . Highest education level: Not on file  Occupational History  . Occupation: Development worker, community  . Occupation: Chief Financial Officer  Tobacco Use  . Smoking status: Former Smoker    Packs/day: 0.50    Years: 2.00    Pack years: 1.00    Types: Cigarettes    Quit date: 09/04/1965    Years since quitting: 54.2  . Smokeless tobacco: Never Used  . Tobacco comment: quit around 74 yrs old.  Vaping Use  . Vaping Use: Never used  Substance and Sexual Activity  . Alcohol use: Yes    Alcohol/week: 20.0 standard drinks    Types: 20 Standard drinks or equivalent per week    Comment: drink 1-2 at night   . Drug use: No  . Sexual activity: Yes    Partners: Female  Other Topics Concern  . Not on file  Social History Narrative   ** Merged History Encounter **       Originally from [place].   Trained as an Chief Financial Officer.    Married '75- 8 years, divorced; Married '86. No children.    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 Sculptor-life size figures    by commission (see work in [place]   Sculpting work regularly.   Never smoking.   Alcohol: drinks1- 2 beers and 1-2 wines every night;    Learning the piano. Patient get regular exercise.   Wife is a Marine scientist.   Caffeine: 3/day    ADLs: drives; independent with ADLs   Advanced Directives: YES; FULL CODE; no prolonged measures.        Social Determinants of Health   Financial Resource Strain:   . Difficulty of Paying Living Expenses: Not on file  Food Insecurity:   . Worried About Charity fundraiser in the Last Year: Not on file  . Ran Out of  Food in the Last Year: Not on file  Transportation Needs:   . Lack of Transportation (Medical): Not on file  . Lack of Transportation (Non-Medical): Not on file  Physical Activity:   . Days of Exercise per Week: Not on file  . Minutes of Exercise per Session: Not on file  Stress:   . Feeling of Stress : Not on file  Social Connections:   . Frequency of Communication with Friends and Family: Not on file  . Frequency of Social Gatherings with Friends and Family: Not on file  . Attends Religious Services: Not on file  . Active Member of Clubs or Organizations: Not on file  . Attends Archivist Meetings: Not on file  . Marital Status: Not on file   Family History  Problem Relation Age of Onset  . Cancer Father 25       stomach versus colon cancer  . Pancreatic cancer Sister 9  . Cancer Brother        penile cancer  . Esophageal cancer Neg Hx   . Throat cancer Neg Hx       Review of Systems: Pertinent positive and negative review of systems were noted in the above HPI section. All other review of systems were otherwise negative.   Physical Exam: General: Well developed, well nourished, no acute distress Head: Normocephalic and atraumatic Eyes:  sclerae anicteric, EOMI Ears: Normal auditory acuity Mouth: Not examined, mask on during Covid-19 pandemic Neck: Supple, no masses or thyromegaly Lungs: Clear throughout to auscultation Heart: Regular rate and  rhythm; no murmurs, rubs or bruits Abdomen: Soft, non tender and non distended. No masses, hepatosplenomegaly or hernias noted. Normal Bowel sounds Rectal: Normal sphincter tone, slightly decreased anal squeeze, very soft brown stool in the vault which was Hemoccult negative, no lesions, no tenderness Musculoskeletal: Symmetrical with no gross deformities  Skin: No lesions on visible extremities Pulses:  Normal pulses noted Extremities: No clubbing, cyanosis, edema or deformities noted Neurological: Alert oriented x 4, grossly nonfocal Cervical Nodes:  No significant cervical adenopathy Inguinal Nodes: No significant inguinal adenopathy Psychological:  Alert and cooperative. Normal mood and affect   Assessment and Recommendations:  1. Alternating diarrhea with constipation, fecal urgency, fecal incontinence.  Discontinue consuming all foods and beverages that cause diarrhea such as milk, eggs and chocolate.  Keep a food diary and avoid any additional foods and beverages that cause diarrhea. Kegel exercises 5 times a day until incontinence resolves. Imodium bid prn. GI stool profile, tTG, IgA, fecal elastase.  If symptoms not controlled consider adding a fiber supplement and consider further evaluation with colonoscopy.  REV in 6 weeks.   2. GERD. Continue omeprazole 20 mg po qd.   3. CRC screening, average risk.  Colonoscopy in 2013 without precancerous polyps.  Cologuard was negative in July 2019.  He is reassured that he is up-to-date on colon cancer screening.  Given his age and above colonoscopy, Cologuard results I do not recommend future screening colonoscopies.   cc: Horald Pollen, MD [place],  [place] [id number]

## 2019-11-26 LAB — TISSUE TRANSGLUTAMINASE, IGA: (tTG) Ab, IgA: <1 U/mL

## 2019-11-26 LAB — IGA: Immunoglobulin A: 318 mg/dL (ref 70–320)

## 2019-11-27 DIAGNOSIS — R197 Diarrhea, unspecified: Secondary | ICD-10-CM | POA: Diagnosis not present

## 2019-11-27 DIAGNOSIS — A09 Infectious gastroenteritis and colitis, unspecified: Secondary | ICD-10-CM | POA: Diagnosis not present

## 2019-11-30 NOTE — Progress Notes (Signed)
Cardiology Office Note:    Date:  12/02/2019   ID:  Oscar Smith, DOB 16-Jul-1945, MRN [id number]  PCP:  Horald Pollen, MD  Cardiologist:  No primary care provider on file.  Electrophysiologist:  None   Referring MD: Horald Pollen, *   Chief Complaint  Patient presents with  . Hypertension    History of Present Illness:    Oscar Smith is a 74 y.o. male with a hx of prostate cancer, hypertension who presents for follow-up.  He was referred by Dr. Mitchel Honour for evaluation of left bundle branch block, initially seen on 05/09/2019.  He went to the ED on 05/06/2019 for dizziness.  Felt to be consistent with vertigo.  However work-up notable for new left bundle branch block.  High-sensitivity troponins 11->19->20.  Walks 3-4 times per week for about 25 minutes.  No chest pain, dyspnea, or syncope.  Does report intermittent episodes where it feels like heart is racing, occurs 1-2 times per month.  Can last minutes.  TTE on 05/27/19 showed LVEF 55 to 06%, grade 1 diastolic dysfunction, normal RV systolic function no significant valvular disease.  Since last clinic visit, he reports that he has been doing well.  States that his dizziness has improved.  He denies any syncope, chest pain, dyspnea.  Has been walking 2-3 times per week for 25 minutes, denies any exertional chest pain or dyspnea.  Reports BP has been 130s to 150s over 70s to 80s at home   Past Medical History:  Diagnosis Date  . Adenocarcinoma of prostate [facility]) urologist-  dr Tresa Moore--  dx Nov 2011--  T1c, Gleason 3+3,  PSA 4.2 (post IMRT 03/2010   Low risk recurrence, Biochemical control,  last PSA 0.30 on 05-28-2014  . BPH (benign prostatic hyperplasia)   . Cancer ([place])   . Diverticulitis   . ED (erectile dysfunction) of organic origin   . GERD (gastroesophageal reflux disease)   . Hiatal hernia   . History of colon polyps    2002 and 2003 hyperplastic  . History of gastritis   . Hypertension     . Lower urinary tract symptoms (LUTS)   . Phimosis   . Sleep apnea    did not complete study.    . Wears hearing aid    bilateral    Past Surgical History:  Procedure Laterality Date  . ABDOMINAL HERNIA REPAIR    . CIRCUMCISION N/A 06/09/2015   Procedure: CIRCUMCISION ADULT WITH PENILE BLOCK;  Surgeon: Alexis Frock, MD;  Location: [facility];  Service: Urology;  Laterality: N/A;  . COLONOSCOPY  last one 11-15-2011  . ENDOVENOUS ABLATION SAPHENOUS VEIN W/ LASER Left 02/10/2016   endovenous laser ablation left greater saphenous vein and stab phlebectomy left leg by Curt Jews MD  . HERNIA REPAIR N/A    Phreesia 07/11/2019  . INGUINAL HERNIA REPAIR Right 12-23-2009  . left shoulder surgery    . LIPOMA EXCISION N/A 12/01/2016   Procedure: EXCISION POSTERIOR NECK MASS;  Surgeon: Irene Limbo, MD;  Location: [facility];  Service: Plastics;  Laterality: N/A;  . PROSTATE SURGERY  2009  . PROSTATE SURGERY    . SHOULDER ARTHROSCOPY WITH SUBACROMIAL DECOMPRESSION, ROTATOR CUFF REPAIR AND BICEP TENDON REPAIR Left 10-2014    Current Medications: Current Meds  Medication Sig  . aspirin EC 81 MG tablet Take 81 mg daily by mouth.  Marland Kitchen atorvastatin (LIPITOR) 10 MG tablet TAKE 1 TABLET ONCE DAILY.  Marland Kitchen  diphenhydramine-acetaminophen (TYLENOL PM) 25-500 MG TABS tablet Take 1 tablet by mouth at bedtime as needed (for sleep).  . fluticasone (FLONASE) 50 MCG/ACT nasal spray Place 2 sprays into both nostrils daily.  Marland Kitchen lisinopril (ZESTRIL) 40 MG tablet TAKE 1 TABLET BY MOUTH DAILY.  . meclizine (ANTIVERT) 25 MG tablet Take 1 tablet (25 mg total) by mouth 3 (three) times daily as needed for up to 25 doses for dizziness.  . Multiple Vitamin (MULTIVITAMIN) tablet Take 1 tablet by mouth daily.  . NON FORMULARY Take 1 capsule by mouth See admin instructions. Metagenics UltraFlora IB Daily Probiotic, Targeted Relief capsules- Take 1 capsule by mouth once a day as needed for  diarrhea symptoms  . Omega-3 Fatty Acids (FISH OIL OMEGA-3 PO) Take 1 capsule by mouth daily.   Marland Kitchen omeprazole (PRILOSEC) 20 MG capsule TAKE 1 CAPSULE 15-20 MINUTES BEFORE BREAKFAST.     Allergies:   Other, Flomax [tamsulosin hcl], and Latex   Social History   Socioeconomic History  . Marital status: Married    Spouse name: Not on file  . Number of children: 0  . Years of education: BS  . Highest education level: Not on file  Occupational History  . Occupation: Development worker, community  . Occupation: Chief Financial Officer  Tobacco Use  . Smoking status: Former Smoker    Packs/day: 0.50    Years: 2.00    Pack years: 1.00    Types: Cigarettes    Quit date: 09/04/1965    Years since quitting: 54.2  . Smokeless tobacco: Never Used  . Tobacco comment: quit around 74 yrs old.  Vaping Use  . Vaping Use: Never used  Substance and Sexual Activity  . Alcohol use: Yes    Alcohol/week: 20.0 standard drinks    Types: 20 Standard drinks or equivalent per week    Comment: drink 1-2 at night   . Drug use: No  . Sexual activity: Yes    Partners: Female  Other Topics Concern  . Not on file  Social History Narrative   ** Merged History Encounter **       Originally from [place].   Trained as an Chief Financial Officer.    Married '75- 77 years, divorced; Married '86. No children.    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 Sculptor-life size figures    by commission (see work in [place]  Sculpting work regularly.   Never smoking.   Alcohol: drinks1- 2 beers and 1-2 wines every night;    Learning the piano. Patient get regular exercise.   Wife is a Marine scientist.   Caffeine: 3/day    ADLs: drives; independent with ADLs   Advanced Directives: YES; FULL CODE; no prolonged measures.        Social Determinants of Health   Financial Resource Strain:   . Difficulty of Paying Living Expenses: Not on file  Food Insecurity:   . Worried About Ship broker in the Last Year: Not on file  . Ran Out of Food in the Last Year: Not on file  Transportation Needs:   . Lack of Transportation (Medical): Not on file  . Lack of Transportation (Non-Medical): Not on file  Physical Activity:   . Days of Exercise per Week: Not on file  . Minutes of Exercise per Session: Not on file  Stress:   . Feeling of Stress : Not on file  Social Connections:   . Frequency of Communication with Friends  and Family: Not on file  . Frequency of Social Gatherings with Friends and Family: Not on file  . Attends Religious Services: Not on file  . Active Member of Clubs or Organizations: Not on file  . Attends Archivist Meetings: Not on file  . Marital Status: Not on file     Family History: The patient'sfamily history includes Cancer in his brother; Cancer (age of onset: 3) in his father; Pancreatic cancer (age of onset: 39) in his sister. There is no history of Esophageal cancer or Throat cancer.  ROS:   Please see the history of present illness.    All other systems reviewed and are negative.  EKGs/Labs/Other Studies Reviewed:    The following studies were reviewed today:  EKG:  EKG is ordered today.  The ekg ordered demonstrates sinus rhythm, first-degree AV block, left bundle branch block, rate 69  TTE 05/27/19: 1. Left ventricular ejection fraction, by estimation, is 55 to 60%. The  left ventricle has normal function. The left ventricle has no regional  wall motion abnormalities. Left ventricular diastolic parameters are  consistent with Grade I diastolic  dysfunction (impaired relaxation).  2. Right ventricular systolic function is normal. The right ventricular  size is mildly enlarged.  3. Left atrial size was mildly dilated.  4. The mitral valve is normal in structure. Trivial mitral valve  regurgitation. No evidence of mitral stenosis.  5. The aortic valve is tricuspid. Aortic valve regurgitation is not  visualized. Mild aortic  valve sclerosis is present, with no evidence of  aortic valve stenosis.  6. The inferior vena cava is normal in size with greater than 50%  respiratory variability, suggesting right atrial pressure of 3 mmHg.   Recent Labs: 05/05/2019: Hemoglobin 14.7; Platelets 201; TSH 0.872 09/17/2019: ALT 32; BUN 12; Creatinine, Ser 0.89; Potassium 4.3; Sodium 135  Recent Lipid Panel    Component Value Date/Time   CHOL 153 09/17/2019 0957   TRIG 88 09/17/2019 0957   HDL 46 09/17/2019 0957   CHOLHDL 3.3 09/17/2019 0957   CHOLHDL 5.1 (H) 06/22/2015 1431   VLDL 21 06/22/2015 1431   LDLCALC 90 09/17/2019 0957   LDLDIRECT 124.7 12/22/2010 1010    Physical Exam:    VS:  BP (!) 150/78   Pulse 69   Ht 5\' 10"  (1.778 m)   Wt 192 lb 6.4 oz (87.3 kg)   SpO2 98%   BMI 27.61 kg/m     Wt Readings from Last 3 Encounters:  12/02/19 192 lb 6.4 oz (87.3 kg)  11/25/19 191 lb (86.6 kg)  09/17/19 189 lb 12.8 oz (86.1 kg)     GEN: Well nourished, well developed in no acute distress HEENT: Normal NECK: No JVD CARDIAC: RRR, no murmurs, rubs, gallops RESPIRATORY:  Clear to auscultation without rales, wheezing or rhonchi  ABDOMEN: Soft, non-tender, non-distended MUSCULOSKELETAL:  No edema SKIN: Warm and dry NEUROLOGIC:  Alert and oriented x 3 PSYCHIATRIC:  Normal affect   ASSESSMENT:    1. Essential hypertension   2. Hyperlipidemia, unspecified hyperlipidemia type   3. LBBB (left bundle branch block)   4. Snoring    PLAN:     Left bundle branch block: TTE on 05/27/19 showed LVEF 55 to 16%, grade 1 diastolic dysfunction, normal RV systolic function no significant valvular disease.  No symptoms to suggest ischemia.  Hypertension: On lisinopril 40 mg daily and amlodipine 5 mg daily.  BP elevated, will increase amlodipine to 5 mg twice daily.  Will asked  patient to check BP twice daily for next 2 weeks and call with results.  Hyperlipidemia: On Lipitor 10 mg daily.  LDL 90 on 09/17/2019.  Will check  calcium score to guide how aggressive to be in lowering cholesterol.  Snoring/daytime somnolence: Normal sleep study  RTC in 1 year  Medication Adjustments/Labs and Tests Ordered: Current medicines are reviewed at length with the patient today.  Concerns regarding medicines are outlined above.  Orders Placed This Encounter  Procedures  . CT CARDIAC SCORING  . EKG 12-Lead   Meds ordered this encounter  Medications  . amLODipine (NORVASC) 5 MG tablet    Sig: Take 1 tablet (5 mg total) by mouth in the morning and at bedtime.    Dispense:  180 tablet    Refill:  3    Increase to BID    Patient Instructions  Medication Instructions:  INCREASE amlodipine to 5 mg two times daily  *If you need a refill on your cardiac medications before your next appointment, please call your pharmacy*  Testing/Procedures: CT coronary calcium score. This test is done at [address]. This is $150 out of pocket.   Coronary CalciumScan A coronary calcium scan is an imaging test used to look for deposits of calcium and other fatty materials (plaques) in the inner lining of the blood vessels of the heart (coronary arteries). These deposits of calcium and plaques can partly clog and narrow the coronary arteries without producing any symptoms or warning signs. This puts a person at risk for a heart attack. This test can detect these deposits before symptoms develop. Tell a health care provider about:  Any allergies you have.  All medicines you are taking, including vitamins, herbs, eye drops, creams, and over-the-counter medicines.  Any problems you or family members have had with anesthetic medicines.  Any blood disorders you have.  Any surgeries you have had.  Any medical conditions you have.  Whether you are pregnant or may be pregnant. What are the risks? Generally, this is a safe procedure. However, problems may occur, including:  Harm to a pregnant woman and her unborn  baby. This test involves the use of radiation. Radiation exposure can be dangerous to a pregnant woman and her unborn baby. If you are pregnant, you generally should not have this procedure done.  Slight increase in the risk of cancer. This is because of the radiation involved in the test. What happens before the procedure? No preparation is needed for this procedure. What happens during the procedure?  You will undress and remove any jewelry around your neck or chest.  You will put on a hospital gown.  Sticky electrodes will be placed on your chest. The electrodes will be connected to an electrocardiogram (ECG) machine to record a tracing of the electrical activity of your heart.  A CT scanner will take pictures of your heart. During this time, you will be asked to lie still and hold your breath for 2-3 seconds while a picture of your heart is being taken. The procedure may vary among health care providers and hospitals. What happens after the procedure?  You can get dressed.  You can return to your normal activities.  It is up to you to get the results of your test. Ask your health care provider, or the department that is doing the test, when your results will be ready. Summary  A coronary calcium scan is an imaging test used to look for deposits of calcium  and other fatty materials (plaques) in the inner lining of the blood vessels of the heart (coronary arteries).  Generally, this is a safe procedure. Tell your health care provider if you are pregnant or may be pregnant.  No preparation is needed for this procedure.  A CT scanner will take pictures of your heart.  You can return to your normal activities after the scan is done. This information is not intended to replace advice given to you by your health care provider. Make sure you discuss any questions you have with your health care provider. Document Released: 07/08/2007 Document Revised: 11/29/2015 Document Reviewed:  11/29/2015 Elsevier Interactive Patient Education  2017 [place]: At [facility], you and your health needs are our priority.  As part of our continuing mission to provide you with exceptional heart care, we have created designated Provider Care Teams.  These Care Teams include your primary Cardiologist (physician) and Advanced Practice Providers (APPs -  Physician Assistants and Nurse Practitioners) who all work together to provide you with the care you need, when you need it.  We recommend signing up for the patient portal called "MyChart".  Sign up information is provided on this After Visit Summary.  MyChart is used to connect with patients for Virtual Visits (Telemedicine).  Patients are able to view lab/test results, encounter notes, upcoming appointments, etc.  Non-urgent messages can be sent to your provider as well.   To learn more about what you can do with MyChart, go to [email or web address].    Your next appointment:   12 month(s)  The format for your next appointment:   In Person  Provider:   Oswaldo Milian, MD   Other Instructions Please check your blood pressure at home daily, write it down.  Call the office or send message via Mychart with the readings in 2 weeks for Dr. Gardiner Rhyme to review.       Signed, Donato Heinz, MD  12/02/2019 8:28 AM    [place]

## 2019-12-01 LAB — GI PROFILE, STOOL, PCR

## 2019-12-02 ENCOUNTER — Other Ambulatory Visit: Payer: Self-pay

## 2019-12-02 ENCOUNTER — Encounter: Payer: Self-pay | Admitting: Cardiology

## 2019-12-02 ENCOUNTER — Ambulatory Visit: Payer: Medicare Other | Admitting: Cardiology

## 2019-12-02 VITALS — BP 150/78 | HR 69 | Ht 70.0 in | Wt 192.4 lb

## 2019-12-02 DIAGNOSIS — I447 Left bundle-branch block, unspecified: Secondary | ICD-10-CM

## 2019-12-02 DIAGNOSIS — R0683 Snoring: Secondary | ICD-10-CM | POA: Diagnosis not present

## 2019-12-02 DIAGNOSIS — I10 Essential (primary) hypertension: Secondary | ICD-10-CM | POA: Diagnosis not present

## 2019-12-02 DIAGNOSIS — E785 Hyperlipidemia, unspecified: Secondary | ICD-10-CM

## 2019-12-02 MED ORDER — AMLODIPINE BESYLATE 5 MG PO TABS: 5.0000 mg | ORAL_TABLET | Freq: Two times a day (BID) | ORAL | 3 refills | Status: DC

## 2019-12-02 MED ORDER — CIPROFLOXACIN HCL 500 MG PO TABS: 500.0000 mg | ORAL_TABLET | Freq: Two times a day (BID) | ORAL | 0 refills | Status: DC

## 2019-12-02 NOTE — Patient Instructions (Addendum)
Medication Instructions:  INCREASE amlodipine to 5 mg two times daily  *If you need a refill on your cardiac medications before your next appointment, please call your pharmacy*  Testing/Procedures: CT coronary calcium score. This test is done at [address]. This is $150 out of pocket.   Coronary CalciumScan A coronary calcium scan is an imaging test used to look for deposits of calcium and other fatty materials (plaques) in the inner lining of the blood vessels of the heart (coronary arteries). These deposits of calcium and plaques can partly clog and narrow the coronary arteries without producing any symptoms or warning signs. This puts a person at risk for a heart attack. This test can detect these deposits before symptoms develop. Tell a health care provider about:  Any allergies you have.  All medicines you are taking, including vitamins, herbs, eye drops, creams, and over-the-counter medicines.  Any problems you or family members have had with anesthetic medicines.  Any blood disorders you have.  Any surgeries you have had.  Any medical conditions you have.  Whether you are pregnant or may be pregnant. What are the risks? Generally, this is a safe procedure. However, problems may occur, including:  Harm to a pregnant woman and her unborn baby. This test involves the use of radiation. Radiation exposure can be dangerous to a pregnant woman and her unborn baby. If you are pregnant, you generally should not have this procedure done.  Slight increase in the risk of cancer. This is because of the radiation involved in the test. What happens before the procedure? No preparation is needed for this procedure. What happens during the procedure?  You will undress and remove any jewelry around your neck or chest.  You will put on a hospital gown.  Sticky electrodes will be placed on your chest. The electrodes will be connected to an electrocardiogram (ECG)  machine to record a tracing of the electrical activity of your heart.  A CT scanner will take pictures of your heart. During this time, you will be asked to lie still and hold your breath for 2-3 seconds while a picture of your heart is being taken. The procedure may vary among health care providers and hospitals. What happens after the procedure?  You can get dressed.  You can return to your normal activities.  It is up to you to get the results of your test. Ask your health care provider, or the department that is doing the test, when your results will be ready. Summary  A coronary calcium scan is an imaging test used to look for deposits of calcium and other fatty materials (plaques) in the inner lining of the blood vessels of the heart (coronary arteries).  Generally, this is a safe procedure. Tell your health care provider if you are pregnant or may be pregnant.  No preparation is needed for this procedure.  A CT scanner will take pictures of your heart.  You can return to your normal activities after the scan is done. This information is not intended to replace advice given to you by your health care provider. Make sure you discuss any questions you have with your health care provider. Document Released: 07/08/2007 Document Revised: 11/29/2015 Document Reviewed: 11/29/2015 Elsevier Interactive Patient Education  2017 [place]: At [facility], you and your health needs are our priority.  As part of our continuing mission to provide you with exceptional heart care, we have created designated Provider Care  Teams.  These Care Teams include your primary Cardiologist (physician) and Advanced Practice Providers (APPs -  Physician Assistants and Nurse Practitioners) who all work together to provide you with the care you need, when you need it.  We recommend signing up for the patient portal called "MyChart".  Sign up information is provided on this After Visit  Summary.  MyChart is used to connect with patients for Virtual Visits (Telemedicine).  Patients are able to view lab/test results, encounter notes, upcoming appointments, etc.  Non-urgent messages can be sent to your provider as well.   To learn more about what you can do with MyChart, go to [email or web address].    Your next appointment:   12 month(s)  The format for your next appointment:   In Person  Provider:   Oswaldo Milian, MD   Other Instructions Please check your blood pressure at home daily, write it down.  Call the office or send message via Mychart with the readings in 2 weeks for Dr. Gardiner Rhyme to review.

## 2019-12-04 ENCOUNTER — Telehealth: Payer: Self-pay | Admitting: Cardiology

## 2019-12-04 NOTE — Telephone Encounter (Signed)
Spoke with patient regarding appointment scheduled 01/05/20 at 3:00 pm at [address] for the calcium scoring ordered by Dr. Gardiner Rhyme.  Patient states he has seen the information on My Chart.

## 2019-12-05 LAB — PANCREATIC ELASTASE, FECAL: Pancreatic Elastase-1, Stool: >500 mcg/g

## 2019-12-12 ENCOUNTER — Other Ambulatory Visit: Payer: Self-pay | Admitting: Emergency Medicine

## 2019-12-12 MED ORDER — FLUTICASONE PROPIONATE 50 MCG/ACT NA SUSP: 2.0000 | Freq: Every day | NASAL | 1 refills | Status: DC

## 2019-12-12 NOTE — Telephone Encounter (Signed)
Medication: fluticasone (FLONASE) 50 MCG/ACT nasal spray [[id number]]   Has the patient contacted their pharmacy? YES  (Agent: If no, request that the patient contact the pharmacy for the refill.) (Agent: If yes, when and what did the pharmacy advise?)  Preferred Pharmacy (with phone number or street name): [place], [address] [place] [address]  Phone:  [phone] Fax:  [phone]   Agent: Please be advised that RX refills may take up to 3 business days. We ask that you follow-up with your pharmacy.

## 2019-12-15 ENCOUNTER — Ambulatory Visit (INDEPENDENT_AMBULATORY_CARE_PROVIDER_SITE_OTHER): Payer: Medicare Other

## 2019-12-15 ENCOUNTER — Ambulatory Visit (INDEPENDENT_AMBULATORY_CARE_PROVIDER_SITE_OTHER): Payer: Medicare Other | Admitting: Registered Nurse

## 2019-12-15 ENCOUNTER — Encounter: Payer: Self-pay | Admitting: Registered Nurse

## 2019-12-15 ENCOUNTER — Other Ambulatory Visit: Payer: Self-pay

## 2019-12-15 VITALS — BP 131/77 | HR 72 | Temp 97.8°F | Resp 18 | Ht 70.0 in | Wt 191.4 lb

## 2019-12-15 DIAGNOSIS — M25572 Pain in left ankle and joints of left foot: Secondary | ICD-10-CM | POA: Diagnosis not present

## 2019-12-15 NOTE — Progress Notes (Signed)
Acute Office Visit  Subjective:    Patient ID: Oscar Smith, male    DOB: 13-Oct-1945, 74 y.o.   MRN: [id number]  Chief Complaint  Patient presents with  . Leg Pain    Patient states he has been habing some l    HPI Patient is in today for LLE pain  Ongoing for a few weeks. One distinct location on lateral aspect of lower calf. Has had on and off swelling. It is okay when he's at rest but worsens with activity. There was some swelling around the diameter of a golfball but this reduces quickly. When at rest it is better when elevated. No redness or streaking. No cold or warm sensation in extremity. Does note that he has NOT had hx of DVT.  Past Medical History:  Diagnosis Date  . Adenocarcinoma of prostate [facility]) urologist-  dr Tresa Moore--  dx Nov 2011--  T1c, Gleason 3+3,  PSA 4.2 (post IMRT 03/2010   Low risk recurrence, Biochemical control,  last PSA 0.30 on 05-28-2014  . BPH (benign prostatic hyperplasia)   . Cancer ([place])   . Diverticulitis   . ED (erectile dysfunction) of organic origin   . GERD (gastroesophageal reflux disease)   . Hiatal hernia   . History of colon polyps    2002 and 2003 hyperplastic  . History of gastritis   . Hypertension   . Lower urinary tract symptoms (LUTS)   . Phimosis   . Sleep apnea    did not complete study.    . Wears hearing aid    bilateral    Past Surgical History:  Procedure Laterality Date  . ABDOMINAL HERNIA REPAIR    . CIRCUMCISION N/A 06/09/2015   Procedure: CIRCUMCISION ADULT WITH PENILE BLOCK;  Surgeon: Alexis Frock, MD;  Location: [facility];  Service: Urology;  Laterality: N/A;  . COLONOSCOPY  last one 11-15-2011  . ENDOVENOUS ABLATION SAPHENOUS VEIN W/ LASER Left 02/10/2016   endovenous laser ablation left greater saphenous vein and stab phlebectomy left leg by Curt Jews MD  . HERNIA REPAIR N/A    Phreesia 07/11/2019  . INGUINAL HERNIA REPAIR Right 12-23-2009  . left shoulder surgery    . LIPOMA  EXCISION N/A 12/01/2016   Procedure: EXCISION POSTERIOR NECK MASS;  Surgeon: Irene Limbo, MD;  Location: [place];  Service: Plastics;  Laterality: N/A;  . PROSTATE SURGERY  2009  . PROSTATE SURGERY    . SHOULDER ARTHROSCOPY WITH SUBACROMIAL DECOMPRESSION, ROTATOR CUFF REPAIR AND BICEP TENDON REPAIR Left 10-2014    Family History  Problem Relation Age of Onset  . Cancer Father 42       stomach versus colon cancer  . Pancreatic cancer Sister 91  . Cancer Brother        penile cancer  . Esophageal cancer Neg Hx   . Throat cancer Neg Hx     Social History   Socioeconomic History  . Marital status: Married    Spouse name: Not on file  . Number of children: 0  . Years of education: BS  . Highest education level: Not on file  Occupational History  . Occupation: Development worker, community  . Occupation: Chief Financial Officer  Tobacco Use  . Smoking status: Former Smoker    Packs/day: 0.50    Years: 2.00    Pack years: 1.00    Types: Cigarettes    Quit date: 09/04/1965    Years since quitting: 54.3  . Smokeless tobacco: Never Used  .  Tobacco comment: quit around 74 yrs old.  Vaping Use  . Vaping Use: Never used  Substance and Sexual Activity  . Alcohol use: Yes    Alcohol/week: 20.0 standard drinks    Types: 20 Standard drinks or equivalent per week    Comment: drink 1-2 at night   . Drug use: No  . Sexual activity: Yes    Partners: Female  Other Topics Concern  . Not on file  Social History Narrative   ** Merged History Encounter **       Originally from [place].   Trained as an Chief Financial Officer.    Married '75- 46 years, divorced; Married '86. No children.    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 Sculptor-life size figures    by commission (see work in [place]  Sculpting work regularly.   Never smoking.   Alcohol: drinks1- 2 beers and 1-2 wines every night;    Learning the piano. Patient get  regular exercise.   Wife is a Marine scientist.   Caffeine: 3/day    ADLs: drives; independent with ADLs   Advanced Directives: YES; FULL CODE; no prolonged measures.        Social Determinants of Health   Financial Resource Strain:   . Difficulty of Paying Living Expenses: Not on file  Food Insecurity:   . Worried About Charity fundraiser in the Last Year: Not on file  . Ran Out of Food in the Last Year: Not on file  Transportation Needs:   . Lack of Transportation (Medical): Not on file  . Lack of Transportation (Non-Medical): Not on file  Physical Activity:   . Days of Exercise per Week: Not on file  . Minutes of Exercise per Session: Not on file  Stress:   . Feeling of Stress : Not on file  Social Connections:   . Frequency of Communication with Friends and Family: Not on file  . Frequency of Social Gatherings with Friends and Family: Not on file  . Attends Religious Services: Not on file  . Active Member of Clubs or Organizations: Not on file  . Attends Archivist Meetings: Not on file  . Marital Status: Not on file  Intimate Partner Violence:   . Fear of Current or Ex-Partner: Not on file  . Emotionally Abused: Not on file  . Physically Abused: Not on file  . Sexually Abused: Not on file    Outpatient Medications Prior to Visit  Medication Sig Dispense Refill  . amLODipine (NORVASC) 5 MG tablet Take 1 tablet (5 mg total) by mouth in the morning and at bedtime. 180 tablet 3  . aspirin EC 81 MG tablet Take 81 mg daily by mouth.    Marland Kitchen atorvastatin (LIPITOR) 10 MG tablet TAKE 1 TABLET ONCE DAILY. 90 tablet 0  . ciprofloxacin (CIPRO) 500 MG tablet Take 1 tablet (500 mg total) by mouth 2 (two) times daily. 14 tablet 0  . diphenhydramine-acetaminophen (TYLENOL PM) 25-500 MG TABS tablet Take 1 tablet by mouth at bedtime as needed (for sleep).    . fluticasone (FLONASE) 50 MCG/ACT nasal spray Place 2 sprays into both nostrils daily. 48 g 1  . lisinopril (ZESTRIL) 40 MG tablet  TAKE 1 TABLET BY MOUTH DAILY. 90 tablet 0  . meclizine (ANTIVERT) 25 MG tablet Take 1 tablet (25 mg total) by mouth 3 (three) times daily as needed for up to 25 doses for dizziness. 25 tablet  0  . Multiple Vitamin (MULTIVITAMIN) tablet Take 1 tablet by mouth daily.    . NON FORMULARY Take 1 capsule by mouth See admin instructions. Metagenics UltraFlora IB Daily Probiotic, Targeted Relief capsules- Take 1 capsule by mouth once a day as needed for diarrhea symptoms    . omeprazole (PRILOSEC) 20 MG capsule TAKE 1 CAPSULE 15-20 MINUTES BEFORE BREAKFAST. 90 capsule 0  . Omega-3 Fatty Acids (FISH OIL OMEGA-3 PO) Take 1 capsule by mouth daily.  (Patient not taking: Reported on 12/15/2019)     No facility-administered medications prior to visit.    Allergies  Allergen Reactions  . Other   . Flomax [Tamsulosin Hcl] Other (See Comments)    "bad reaction" = jaw pain  . Latex Itching and Rash    Review of Systems Per hpi      Objective:    Physical Exam Cardiovascular:     Pulses: Normal pulses.  Musculoskeletal:        General: No swelling, tenderness, deformity or signs of injury. Normal range of motion.     Right lower leg: No edema.     Left lower leg: No edema.     Comments: Strength 5/5 through LLE  Skin:    General: Skin is warm and dry.     Capillary Refill: Capillary refill takes less than 2 seconds.     Coloration: Skin is not jaundiced or pale.     Findings: No bruising, erythema, lesion or rash.  Neurological:     General: No focal deficit present.     Mental Status: He is oriented to person, place, and time. Mental status is at baseline.  Psychiatric:        Mood and Affect: Mood normal.        Behavior: Behavior normal.        Thought Content: Thought content normal.        Judgment: Judgment normal.     BP 131/77   Pulse 72   Temp 97.8 F (36.6 C) (Temporal)   Resp 18   Ht 5\' 10"  (1.778 m)   Wt 191 lb 6.4 oz (86.8 kg)   SpO2 97%   BMI 27.46 kg/m  Wt  Readings from Last 3 Encounters:  12/15/19 191 lb 6.4 oz (86.8 kg)  12/02/19 192 lb 6.4 oz (87.3 kg)  11/25/19 191 lb (86.6 kg)    There are no preventive care reminders to display for this patient.  There are no preventive care reminders to display for this patient.   Lab Results  Component Value Date   TSH 0.872 05/05/2019   Lab Results  Component Value Date   WBC 6.6 05/05/2019   HGB 14.7 05/05/2019   HCT 43.5 05/05/2019   MCV 92.2 05/05/2019   PLT 201 05/05/2019   Lab Results  Component Value Date   NA 135 09/17/2019   K 4.3 09/17/2019   CO2 24 09/17/2019   GLUCOSE 83 09/17/2019   BUN 12 09/17/2019   CREATININE 0.89 09/17/2019   BILITOT 0.6 09/17/2019   ALKPHOS 92 09/17/2019   AST 22 09/17/2019   ALT 32 09/17/2019   PROT 6.8 09/17/2019   ALBUMIN 4.6 09/17/2019   CALCIUM 9.4 09/17/2019   ANIONGAP 9 05/05/2019   GFR 83.93 12/22/2010   Lab Results  Component Value Date   CHOL 153 09/17/2019   Lab Results  Component Value Date   HDL 46 09/17/2019   Lab Results  Component Value Date   LDLCALC 90  09/17/2019   Lab Results  Component Value Date   TRIG 88 09/17/2019   Lab Results  Component Value Date   CHOLHDL 3.3 09/17/2019   No results found for: HGBA1C     Assessment & Plan:   Problem List Items Addressed This Visit    None    Visit Diagnoses    Acute left ankle pain    -  Primary   Relevant Orders   DG Ankle Complete Left       No orders of the defined types were placed in this encounter.  PLAN  Given hx of varicosities, concern for a developing DVT? Though no red flags. Has had vein surgery in the past. No skin abnormalities or obvious signs of bleeding  Ankle xray onsite unconcerning, only abnormality is small calcaneal spur likely noncontributory  If DVT ruled out will consider podiatry / ortho referrals.  Suggest nonpharm and voltaren gel in the mean time.  Patient encouraged to call clinic with any questions, comments, or  concerns.   Maximiano Coss, NP

## 2019-12-15 NOTE — Patient Instructions (Signed)
° ° ° °  If you have lab work done today you will be contacted with your lab results within the next 2 weeks.  If you have not heard from us then please contact us. The fastest way to get your results is to register for My Chart. ° ° °IF you received an x-ray today, you will receive an invoice from [facility]. Please contact [facility] at [phone] with questions or concerns regarding your invoice.  ° °IF you received labwork today, you will receive an invoice from [facility]. Please contact [facility] at [phone] with questions or concerns regarding your invoice.  ° °Our billing staff will not be able to assist you with questions regarding bills from these companies. ° °You will be contacted with the lab results as soon as they are available. The fastest way to get your results is to activate your My Chart account. Instructions are located on the last page of this paperwork. If you have not heard from us regarding the results in 2 weeks, please contact this office. °  ° ° ° °

## 2019-12-16 ENCOUNTER — Other Ambulatory Visit: Payer: Self-pay

## 2019-12-16 ENCOUNTER — Encounter: Payer: Self-pay | Admitting: Emergency Medicine

## 2019-12-16 DIAGNOSIS — M25572 Pain in left ankle and joints of left foot: Secondary | ICD-10-CM

## 2019-12-24 NOTE — Telephone Encounter (Signed)
BP looks okay, no changes at this time

## 2019-12-25 ENCOUNTER — Encounter: Payer: Self-pay | Admitting: Orthopedic Surgery

## 2019-12-25 ENCOUNTER — Ambulatory Visit: Payer: Medicare Other | Admitting: Podiatry

## 2019-12-25 ENCOUNTER — Ambulatory Visit: Payer: Medicare Other | Admitting: Orthopedic Surgery

## 2019-12-25 DIAGNOSIS — G5732 Lesion of lateral popliteal nerve, left lower limb: Secondary | ICD-10-CM | POA: Diagnosis not present

## 2019-12-25 DIAGNOSIS — M25572 Pain in left ankle and joints of left foot: Secondary | ICD-10-CM

## 2019-12-29 ENCOUNTER — Encounter: Payer: Self-pay | Admitting: Orthopedic Surgery

## 2019-12-29 NOTE — Progress Notes (Signed)
Office Visit Note   Patient: Oscar Smith           Date of Birth: 07-Nov-1945           MRN: [id number] Visit Date: 12/25/2019              Requested by: Horald Pollen, MD [place],   [id number] PCP: Horald Pollen, MD  Chief Complaint  Patient presents with  . Left Ankle - Pain      HPI: Patient is a 74 year old gentleman who presents with pain over the anterior lateral aspect of the left ankle.  Patient states he stands up a lot and does have swelling.  He states there was a knot last week denies any injury.  States he does have burning does not have pain with sitting patient states the radiographs were obtained 2 weeks ago.  Assessment & Plan: Visit Diagnoses:  1. Pain in left ankle and joints of left foot   2. 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  Follow-Up Instructions: Return in about 2 weeks (around 01/08/2020).   Ortho Exam  Patient is alert, oriented, no adenopathy, well-dressed, normal affect, normal respiratory effort. Examination the ankle ligaments are nontender to palpation the syndesmosis is nontender to palpation.  Patient has pain reproduced with palpation over the superficial peroneal nerve X centimeters proximal to the lateral malleolus this point area of tenderness reproduces his pain.  Patient has no pain with active or passive range of motion of the calf no evidence of a DVT or compartment syndrome.  Imaging: No results found. No images are attached to the encounter.  Labs: No results found for: HGBA1C, ESRSEDRATE, CRP, LABURIC, REPTSTATUS, GRAMSTAIN, CULT, LABORGA   Lab Results  Component Value Date   ALBUMIN 4.6 09/17/2019   ALBUMIN 4.5 03/19/2019   ALBUMIN 4.4 06/25/2017    No results found for: MG No results found for: VD25OH  No  results found for: PREALBUMIN CBC EXTENDED Latest Ref Rng & Units 05/05/2019 05/05/2019 03/19/2019  WBC 4.0 - 10.5 K/uL 6.6 5.3 5.3  RBC 4.22 - 5.81 MIL/uL 4.72 4.58(A) 4.96  HGB 13.0 - 17.0 g/dL 14.7 14.4 15.8  HCT 39 - 52 % 43.5 43.9(A) 46.5  PLT 150 - 400 K/uL 201 - 188  NEUTROABS 1.40 - 7.00 x10E3/uL - - 2.9  LYMPHSABS 0 - 3 x10E3/uL - - 1.7     There is no height or weight on file to calculate BMI.  Orders:  No orders of the defined types were placed in this encounter.  No orders of the defined types were placed in this encounter.    Procedures: No procedures performed  Clinical Data: No additional findings.  ROS:  All other systems negative, except as noted in the HPI. Review of Systems  Objective: Vital Signs: There were no vitals taken for this visit.  Specialty Comments:  No specialty comments available.  PMFS History: Patient Active Problem List   Diagnosis Date Noted  . Dyslipidemia 09/17/2019  . History of diverticulosis 09/17/2019  . Chronic sinus complaints 09/17/2019  . Essential hypertension 07/03/2018  . Pure hypercholesterolemia 07/03/2018  . Osteoarthritis of right hip 02/07/2018  . Lumbar radiculopathy 01/24/2017  . Enlarged prostate with urinary retention 12/25/2013  . Erectile dysfunction 12/25/2013  . Incomplete bladder emptying 08/27/2013  . Chronic  sinusitis 02/20/2012  . Frontal mucocele 02/20/2012  . Nasal septal deviation 02/20/2012  . ADENOCARCINOMA, PROSTATE, GLEASON GRADE 6 12/21/2009  . GERD 11/12/2008   Past Medical History:  Diagnosis Date  . Adenocarcinoma of prostate [facility]) urologist-  dr Tresa Moore--  dx Nov 2011--  T1c, Gleason 3+3,  PSA 4.2 (post IMRT 03/2010   Low risk recurrence, Biochemical control,  last PSA 0.30 on 05-28-2014  . BPH (benign prostatic hyperplasia)   . Cancer ([place])   . Diverticulitis   . ED (erectile dysfunction) of organic origin   . GERD (gastroesophageal reflux disease)   . Hiatal hernia   . History  of colon polyps    2002 and 2003 hyperplastic  . History of gastritis   . Hypertension   . Lower urinary tract symptoms (LUTS)   . Phimosis   . Sleep apnea    did not complete study.    . Wears hearing aid    bilateral    Family History  Problem Relation Age of Onset  . Cancer Father 18       stomach versus colon cancer  . Pancreatic cancer Sister 82  . Cancer Brother        penile cancer  . Esophageal cancer Neg Hx   . Throat cancer Neg Hx     Past Surgical History:  Procedure Laterality Date  . ABDOMINAL HERNIA REPAIR    . CIRCUMCISION N/A 06/09/2015   Procedure: CIRCUMCISION ADULT WITH PENILE BLOCK;  Surgeon: Alexis Frock, MD;  Location: [facility];  Service: Urology;  Laterality: N/A;  . COLONOSCOPY  last one 11-15-2011  . ENDOVENOUS ABLATION SAPHENOUS VEIN W/ LASER Left 02/10/2016   endovenous laser ablation left greater saphenous vein and stab phlebectomy left leg by Curt Jews MD  . HERNIA REPAIR N/A    Phreesia 07/11/2019  . INGUINAL HERNIA REPAIR Right 12-23-2009  . left shoulder surgery    . LIPOMA EXCISION N/A 12/01/2016   Procedure: EXCISION POSTERIOR NECK MASS;  Surgeon: Irene Limbo, MD;  Location: [place];  Service: Plastics;  Laterality: N/A;  . PROSTATE SURGERY  2009  . PROSTATE SURGERY    . SHOULDER ARTHROSCOPY WITH SUBACROMIAL DECOMPRESSION, ROTATOR CUFF REPAIR AND BICEP TENDON REPAIR Left 10-2014   Social History   Occupational History  . Occupation: Development worker, community  . Occupation: Chief Financial Officer  Tobacco Use  . Smoking status: Former Smoker    Packs/day: 0.50    Years: 2.00    Pack years: 1.00    Types: Cigarettes    Quit date: 09/04/1965    Years since quitting: 54.3  . Smokeless tobacco: Never Used  . Tobacco comment: quit around 74 yrs old.  Vaping Use  . Vaping Use: Never used  Substance and Sexual Activity  . Alcohol use: Yes    Alcohol/week: 20.0 standard drinks    Types: 20 Standard drinks or equivalent per  week    Comment: drink 1-2 at night   . Drug use: No  . Sexual activity: Yes    Partners: Female

## 2020-01-05 ENCOUNTER — Ambulatory Visit (INDEPENDENT_AMBULATORY_CARE_PROVIDER_SITE_OTHER)
Admission: RE | Admit: 2020-01-05 | Discharge: 2020-01-05 | Disposition: A | Discharge status: Home / Self Care | Payer: Self-pay | Source: Ambulatory Visit | Attending: Cardiology | Admitting: Cardiology

## 2020-01-05 ENCOUNTER — Other Ambulatory Visit: Payer: Self-pay

## 2020-01-05 DIAGNOSIS — E785 Hyperlipidemia, unspecified: Secondary | ICD-10-CM

## 2020-01-07 MED ORDER — ATORVASTATIN CALCIUM 20 MG PO TABS
20.0000 mg | ORAL_TABLET | Freq: Every day | ORAL | 6 refills | Status: DC
Start: 2020-01-07 — End: 2020-08-09

## 2020-01-08 DIAGNOSIS — D225 Melanocytic nevi of trunk: Secondary | ICD-10-CM | POA: Diagnosis not present

## 2020-01-08 DIAGNOSIS — L821 Other seborrheic keratosis: Secondary | ICD-10-CM | POA: Diagnosis not present

## 2020-01-08 DIAGNOSIS — D2272 Melanocytic nevi of left lower limb, including hip: Secondary | ICD-10-CM | POA: Diagnosis not present

## 2020-01-08 DIAGNOSIS — D1801 Hemangioma of skin and subcutaneous tissue: Secondary | ICD-10-CM | POA: Diagnosis not present

## 2020-01-08 DIAGNOSIS — D2271 Melanocytic nevi of right lower limb, including hip: Secondary | ICD-10-CM | POA: Diagnosis not present

## 2020-01-12 ENCOUNTER — Ambulatory Visit: Payer: Medicare Other | Admitting: Gastroenterology

## 2020-01-12 ENCOUNTER — Encounter: Payer: Self-pay | Admitting: Gastroenterology

## 2020-01-12 VITALS — BP 100/60 | HR 80 | Ht 68.5 in | Wt 192.1 lb

## 2020-01-12 DIAGNOSIS — R197 Diarrhea, unspecified: Secondary | ICD-10-CM | POA: Diagnosis not present

## 2020-01-12 NOTE — Progress Notes (Signed)
    History of Present Illness: This is a 74 year old male returning for follow-up of diarrhea and incontinence. Both have substantially improved.  He is accompanied by his wife.  Stool testing revealed enteropathogenic E. coli which was treated with a course of Cipro.  He notes his diarrhea, incontinence significantly improved following the course of Cipro.  He notes several foods that predictably produce diarrhea including eggs, chocolate.   Current Medications, Allergies, Past Medical History, Past Surgical History, Family History and Social History were reviewed in [employer] record.   Physical Exam: General: Well developed, well nourished, no acute distress Head: Normocephalic and atraumatic Eyes:  sclerae anicteric, EOMI Ears: Normal auditory acuity Neurological: Alert oriented x 4, grossly nonfocal Psychological:  Alert and cooperative. Normal mood and affect   Assessment and Recommendations:  1. Diarrhea and incontinence improved following treatment of enteropathogenic E. coli.  Several food intolerances intolerances which he will work to avoid.  Imodium twice daily as needed.  Continue Kegel exercises 5 times daily. Return to PCP for ongoing care. GI follow up as needed.   2. CRC screening, average risk.  He states he is interested in having Cologuard again.  This will be due in July 2022 and we will defer this to his PCP.

## 2020-01-12 NOTE — Patient Instructions (Signed)
If you are age 76 or older, your body mass index should be between 23-30. Your Body mass index is 28.79 kg/m. If this is out of the aforementioned range listed, please consider follow up with your Primary Care Provider.  If you are age 27 or younger, your body mass index should be between 19-25. Your Body mass index is 28.79 kg/m. If this is out of the aformentioned range listed, please consider follow up with your Primary Care Provider.    Please follow up as needed.   It was great seeing you today!  Thank you for entrusting me with your care and choosing [facility].  Dr. Fuller Plan

## 2020-01-30 ENCOUNTER — Other Ambulatory Visit: Payer: Self-pay | Admitting: Emergency Medicine

## 2020-01-30 NOTE — Telephone Encounter (Signed)
Requested Prescriptions  Pending Prescriptions Disp Refills  . omeprazole (PRILOSEC) 20 MG capsule [Pharmacy Med Name: OMEPRAZOLE DR 20 MG CAPSULE] 90 capsule 1    Sig: TAKE 1 CAPSULE 15-20 MINUTES BEFORE BREAKFAST.     Gastroenterology: Proton Pump Inhibitors Passed - 01/30/2020  1:13 PM      Passed - Valid encounter within last 12 months    Recent Outpatient Visits          1 month ago Acute left ankle pain   [facility] at [place], NP   3 months ago Encounter for immunization   Primary Care at [facility], Ines Bloomer, MD   4 months ago Essential hypertension   Primary [facility] at [place], Ines Bloomer, MD   6 months ago Medicare annual wellness visit, subsequent   Primary Care at [facility], Ines Bloomer, MD   9 months ago Vertigo   [facility] at [facility], Ranell Patrick, MD      Future Appointments            In 1 month [place], Ines Bloomer, MD Primary Care at [place], [facility]

## 2020-02-15 ENCOUNTER — Other Ambulatory Visit: Payer: Self-pay | Admitting: Emergency Medicine

## 2020-02-15 NOTE — Telephone Encounter (Signed)
Requested Prescriptions  Pending Prescriptions Disp Refills  . lisinopril (ZESTRIL) 40 MG tablet [Pharmacy Med Name: LISINOPRIL 40 MG TABLET] 90 tablet 0    Sig: TAKE 1 TABLET BY MOUTH DAILY.     Cardiovascular:  ACE Inhibitors Passed - 02/15/2020  4:21 PM      Passed - Cr in normal range and within 180 days    Creat  Date Value Ref Range Status  06/22/2015 0.89 0.70 - 1.18 mg/dL Final   Creatinine, Ser  Date Value Ref Range Status  09/17/2019 0.89 0.76 - 1.27 mg/dL Final         Passed - K in normal range and within 180 days    Potassium  Date Value Ref Range Status  09/17/2019 4.3 3.5 - 5.2 mmol/L Final         Passed - Patient is not pregnant      Passed - Last BP in normal range    BP Readings from Last 1 Encounters:  01/12/20 100/60         Passed - Valid encounter within last 6 months    Recent Outpatient Visits          2 months ago Acute left ankle pain   [facility] at [place], NP   3 months ago Encounter for immunization   Primary Care at [facility], Ines Bloomer, MD   5 months ago Essential hypertension   Primary [facility] at [place], [place], MD   7 months ago Medicare annual wellness visit, subsequent   Primary Care at [facility], Ines Bloomer, MD   9 months ago Vertigo   [facility] at [facility], Ranell Patrick, MD      Future Appointments            In 1 month Sagardia, Ines Bloomer, MD Primary Care at [place], [facility]

## 2020-03-04 DIAGNOSIS — H524 Presbyopia: Secondary | ICD-10-CM | POA: Diagnosis not present

## 2020-03-04 DIAGNOSIS — H31003 Unspecified chorioretinal scars, bilateral: Secondary | ICD-10-CM | POA: Diagnosis not present

## 2020-03-04 DIAGNOSIS — H2513 Age-related nuclear cataract, bilateral: Secondary | ICD-10-CM | POA: Diagnosis not present

## 2020-03-04 DIAGNOSIS — H52203 Unspecified astigmatism, bilateral: Secondary | ICD-10-CM | POA: Diagnosis not present

## 2020-03-18 ENCOUNTER — Other Ambulatory Visit: Payer: Self-pay

## 2020-03-18 ENCOUNTER — Encounter: Payer: Self-pay | Admitting: Emergency Medicine

## 2020-03-18 ENCOUNTER — Ambulatory Visit (INDEPENDENT_AMBULATORY_CARE_PROVIDER_SITE_OTHER): Payer: Medicare Other | Admitting: Emergency Medicine

## 2020-03-18 VITALS — BP 132/69 | HR 76 | Temp 98.2°F | Resp 16 | Ht 68.0 in | Wt 192.0 lb

## 2020-03-18 DIAGNOSIS — E785 Hyperlipidemia, unspecified: Secondary | ICD-10-CM

## 2020-03-18 DIAGNOSIS — R0981 Nasal congestion: Secondary | ICD-10-CM | POA: Diagnosis not present

## 2020-03-18 DIAGNOSIS — I10 Essential (primary) hypertension: Secondary | ICD-10-CM | POA: Diagnosis not present

## 2020-03-18 DIAGNOSIS — J320 Chronic maxillary sinusitis: Secondary | ICD-10-CM | POA: Diagnosis not present

## 2020-03-18 MED ORDER — AMOXICILLIN-POT CLAVULANATE 875-125 MG PO TABS: 1.0000 | ORAL_TABLET | Freq: Two times a day (BID) | ORAL | 0 refills | Status: AC

## 2020-03-18 MED ORDER — GUAIFENESIN ER 600 MG PO TB12: 600.0000 mg | ORAL_TABLET | Freq: Two times a day (BID) | ORAL | 1 refills | Status: AC

## 2020-03-18 NOTE — Progress Notes (Signed)
Oscar Smith 75 y.o.   Chief Complaint  Patient presents with  . Hypertension    Follow up 6 months chronic medical conditions    HISTORY OF PRESENT ILLNESS: This is a 75 y.o. male with history of hypertension presently on amlodipine 5 mg daily here for follow-up. Blood pressure readings at home normal.  No complaints. Today mostly complaining about chronic sinus problems, possibly an acute infection.  Complaining of sinus congestion a lot of "mucus in my throat", cough.  Was seen by ENT doctor about 3 months ago, schedule to see him again next month. Also recently saw urologist about a "spot on my penis".  Scheduled to follow-up with him next month. Last year was also having episodes of diarrhea.  Saw GI doctor and was found to have enteropathogenic E. coli infection that was treated with Cipro successfully.  Much improved now.  Infrequent bouts of diarrhea now are diet dependent. No other complaints or medical concerns today. BP Readings from Last 3 Encounters:  03/18/20 132/69  01/12/20 100/60  12/15/19 131/77  Office visit with GI doctor on 01/12/2020: Assessment and Recommendations:  1. Diarrhea and incontinence improved following treatment of enteropathogenic E. coli.  Several food intolerances intolerances which he will work to avoid.  Imodium twice daily as needed.  Continue Kegel exercises 5 times daily. Return to PCP for ongoing care. GI follow up as needed.   2. CRC screening, average risk.  He states he is interested in having Cologuard again.  This will be due in July 2022 and we will defer this to his PCP.   HPI   Prior to Admission medications   Medication Sig Start Date End Date Taking? Authorizing Provider  amLODipine (NORVASC) 5 MG tablet Take 1 tablet (5 mg total) by mouth in the morning and at bedtime. 12/02/19 11/26/20 Yes Donato Heinz, MD  aspirin EC 81 MG tablet Take 81 mg daily by mouth.   Yes [provider]  atorvastatin  (LIPITOR) 20 MG tablet Take 1 tablet (20 mg total) by mouth daily. Patient taking differently: Take 20 mg by mouth at bedtime. 01/07/20  Yes Donato Heinz, MD  diphenhydramine-acetaminophen (TYLENOL PM) 25-500 MG TABS tablet Take 1 tablet by mouth at bedtime as needed (for sleep).   Yes [provider]  fluticasone (FLONASE) 50 MCG/ACT nasal spray Place 2 sprays into both nostrils daily. 12/12/19  Yes Dylan Ruotolo, Ines Bloomer, MD  lisinopril (ZESTRIL) 40 MG tablet TAKE 1 TABLET BY MOUTH DAILY. 02/15/20  Yes Neymar Dowe, Ines Bloomer, MD  meclizine (ANTIVERT) 25 MG tablet Take 1 tablet (25 mg total) by mouth 3 (three) times daily as needed for up to 25 doses for dizziness. 05/06/19  Yes Wyvonnia Dusky, MD  Multiple Vitamin (MULTIVITAMIN) tablet Take 1 tablet by mouth daily.   Yes [provider]  omeprazole (PRILOSEC) 20 MG capsule TAKE 1 CAPSULE 15-20 MINUTES BEFORE BREAKFAST. 01/30/20  Yes Olin Gurski, Ines Bloomer, MD    Allergies  Allergen Reactions  . Other   . Flomax [Tamsulosin Hcl] Other (See Comments)    "bad reaction" = jaw pain  . Latex Itching and Rash    Patient Active Problem List   Diagnosis Date Noted  . Dyslipidemia 09/17/2019  . History of diverticulosis 09/17/2019  . Chronic sinus complaints 09/17/2019  . Essential hypertension 07/03/2018  . Pure hypercholesterolemia 07/03/2018  . Osteoarthritis of right hip 02/07/2018  . Lumbar radiculopathy 01/24/2017  . Enlarged prostate with urinary retention 12/25/2013  .  Erectile dysfunction 12/25/2013  . Incomplete bladder emptying 08/27/2013  . Chronic sinusitis 02/20/2012  . Frontal mucocele 02/20/2012  . Nasal septal deviation 02/20/2012  . ADENOCARCINOMA, PROSTATE, GLEASON GRADE 6 12/21/2009  . GERD 11/12/2008    Past Medical History:  Diagnosis Date  . Adenocarcinoma of prostate [facility]) urologist-  dr Tresa Moore--  dx Nov 2011--  T1c, Gleason 3+3,  PSA 4.2 (post IMRT 03/2010   Low risk recurrence,  Biochemical control,  last PSA 0.30 on 05-28-2014  . Atherosclerosis   . BPH (benign prostatic hyperplasia)   . Cancer ([place])   . Diverticulitis   . ED (erectile dysfunction) of organic origin   . GERD (gastroesophageal reflux disease)   . Hiatal hernia   . History of colon polyps    2002 and 2003 hyperplastic  . History of gastritis   . Hypertension   . Lower urinary tract symptoms (LUTS)   . Phimosis   . Sleep apnea    did not complete study.    . Wears hearing aid    bilateral    Past Surgical History:  Procedure Laterality Date  . ABDOMINAL HERNIA REPAIR    . CIRCUMCISION N/A 06/09/2015   Procedure: CIRCUMCISION ADULT WITH PENILE BLOCK;  Surgeon: Alexis Frock, MD;  Location: [facility];  Service: Urology;  Laterality: N/A;  . COLONOSCOPY  last one 11-15-2011  . ENDOVENOUS ABLATION SAPHENOUS VEIN W/ LASER Left 02/10/2016   endovenous laser ablation left greater saphenous vein and stab phlebectomy left leg by Curt Jews MD  . HERNIA REPAIR N/A    [employer]rees[employer] 07/11/2019  . INGUINAL HERNIA REPAIR Right 12-23-2009  . left shoulder surgery    . LIPOMA EXCISION N/A 12/01/2016   Procedure: EXCISION POSTERIOR NECK MASS;  Surgeon: Irene Limbo, MD;  Location: [facility];  Service: Plastics;  Laterality: N/A;  . PROSTATE SURGERY  2009  . PROSTATE SURGERY    . SHOULDER ARTHROSCOPY WITH SUBACROMIAL DECOMPRESSION, ROTATOR CUFF REPAIR AND BICEP TENDON REPAIR Left 10-2014    Social History   Socioeconomic History  . Marital status: Married    Spouse name: Not on file  . Number of children: 0  . Years of education: BS  . Highest education level: Not on file  Occupational History  . Occupation: Development worker, community  . Occupation: Chief Financial Officer  Tobacco Use  . Smoking status: Former Smoker    Packs/day: 0.50    Years: 2.00    Pack years: 1.00    Types: Cigarettes    Quit date: 09/04/1965    Years since quitting: 54.5  . Smokeless tobacco: Never Used  .  Tobacco comment: quit around 75 yrs old.  Vaping Use  . Vaping Use: Never used  Substance and Sexual Activity  . Alcohol use: Yes    Alcohol/week: 20.0 standard drinks    Types: 20 Standard drinks or equivalent per week    Comment: drink 1-2 at night   . Drug use: No  . Sexual activity: Yes    Partners: Female  Other Topics Concern  . Not on file  Social History Narrative   ** Merged History Encounter **       Originally from [place].   Trained as an Chief Financial Officer.    Married '75- 31 years, divorced; Married '86. No children.    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 Sculptor-life size figures    by commission (see work in [place]  Sculpting work regularly.   Never smoking.   Alcohol: drinks1- 2 beers and 1-2 wines every night;    Learning the piano. Patient get regular exercise.   Wife is a Marine scientist.   Caffeine: 3/day    ADLs: drives; independent with ADLs   Advanced Directives: YES; FULL CODE; no prolonged measures.        Social Determinants of Health   Financial Resource Strain: Not on file  Food Insecurity: Not on file  Transportation Needs: Not on file  Physical Activity: Not on file  Stress: Not on file  Social Connections: Not on file  Intimate Partner Violence: Not on file    Family History  Problem Relation Age of Onset  . Cancer Father 21       stomach versus colon cancer  . Pancreatic cancer Sister 106  . Cancer Brother        penile cancer  . Esophageal cancer Neg Hx   . Throat cancer Neg Hx      Review of Systems  Constitutional: Negative.  Negative for chills and fever.  HENT: Positive for congestion and sinus pain.   Respiratory: Positive for cough. Negative for shortness of breath.   Cardiovascular: Negative.  Negative for chest pain and palpitations.  Gastrointestinal: Negative for abdominal pain, diarrhea, nausea and vomiting.  Genitourinary: Negative for dysuria.   Skin: Negative.  Negative for rash.  Neurological: Negative.  Negative for dizziness and headaches.  All other systems reviewed and are negative.   Today's Vitals   03/18/20 0856  BP: 132/69  Pulse: 76  Resp: 16  Temp: 98.2 F (36.8 C)  TempSrc: Temporal  SpO2: 97%  Weight: 192 lb (87.1 kg)  Height: 5\' 8"  (1.727 m)   Body mass index is 29.19 kg/m. Wt Readings from Last 3 Encounters:  03/18/20 192 lb (87.1 kg)  01/12/20 192 lb 2 oz (87.1 kg)  12/15/19 191 lb 6.4 oz (86.8 kg)    Physical Exam Vitals reviewed.  Constitutional:      Appearance: Normal appearance.  HENT:     Head: Normocephalic.     Nose: Nose normal.     Mouth/Throat:     Mouth: Mucous membranes are moist.     Pharynx: Oropharynx is clear.  Eyes:     Extraocular Movements: Extraocular movements intact.     Pupils: Pupils are equal, round, and reactive to light.  Cardiovascular:     Rate and Rhythm: Normal rate and regular rhythm.     Pulses: Normal pulses.     Heart sounds: Normal heart sounds.  Pulmonary:     Effort: Pulmonary effort is normal.     Breath sounds: Normal breath sounds.  Musculoskeletal:        General: Normal range of motion.     Cervical back: Normal range of motion and neck supple.  Skin:    General: Skin is warm and dry.     Capillary Refill: Capillary refill takes less than 2 seconds.  Neurological:     General: No focal deficit present.     Mental Status: He is alert and oriented to person, place, and time.  Psychiatric:        Mood and Affect: Mood normal.        Behavior: Behavior normal.      ASSESSMENT & PLAN: Oscar Smith was seen today for hypertension.  Diagnoses and all orders for this visit:  Essential hypertension  Sinus congestion -     guaiFENesin (MUCINEX) 600 MG  12 hr tablet; Take 1 tablet (600 mg total) by mouth 2 (two) times daily for 7 days.  Chronic maxillary sinusitis -     amoxicillin-clavulanate (AUGMENTIN) 875-125 MG tablet; Take 1 tablet by  mouth 2 (two) times daily for 7 days.  Dyslipidemia    Patient Instructions       If you have lab work done today you will be contacted with your lab results within the next 2 weeks.  If you have not heard from [place] then please contact us. The fastest way to get your results is to register for My Chart.   IF you received an x-ray today, you will receive an invoice from [facility]. Please contact [facility] at [phone] with questions or concerns regarding your invoice.   IF you received labwork today, you will receive an invoice from [facility]. Please contact [facility] at [phone] with questions or concerns regarding your invoice.   Our billing staff will not be able to assist you with questions regarding bills from these companies.  You will be contacted with the lab results as soon as they are available. The fastest way to get your results is to activate your My Chart account. Instructions are located on the last page of this paperwork. If you have not heard from [place] regarding the results in 2 weeks, please contact this office.     Sinusitis, Adult Sinusitis is soreness and swelling (inflammation) of your sinuses. Sinuses are hollow spaces in the bones around your face. They are located:  Around your eyes.  In the middle of your forehead.  Behind your nose.  In your cheekbones. Your sinuses and nasal passages are lined with a fluid called mucus. Mucus drains out of your sinuses. Swelling can trap mucus in your sinuses. This lets germs (bacteria, virus, or fungus) grow, which leads to infection. Most of the time, this condition is caused by a virus. What are the causes? This condition is caused by:  Allergies.  Asthma.  Germs.  Things that block your nose or sinuses.  Growths in the nose (nasal polyps).  Chemicals or irritants in the air.  Fungus (rare). What increases the risk? You are more likely to develop this condition if:  You have a  weak body defense system (immune system).  You do a lot of swimming or diving.  You use nasal sprays too much.  You smoke. What are the signs or symptoms? The main symptoms of this condition are pain and a feeling of pressure around the sinuses. Other symptoms include:  Stuffy nose (congestion).  Runny nose (drainage).  Swelling and warmth in the sinuses.  Headache.  Toothache.  A cough that may get worse at night.  Mucus that collects in the throat or the back of the nose (postnasal drip).  Being unable to smell and taste.  Being very tired (fatigue).  A fever.  Sore throat.  Bad breath. How is this diagnosed? This condition is diagnosed based on:  Your symptoms.  Your medical history.  A physical exam.  Tests to find out if your condition is short-term (acute) or long-term (chronic). Your doctor may: ? Check your nose for growths (polyps). ? Check your sinuses using a tool that has a light (endoscope). ? Check for allergies or germs. ? Do imaging tests, such as an MRI or CT scan. How is this treated? Treatment for this condition depends on the cause and whether it is short-term or long-term.  If caused by a virus, your  symptoms should go away on their own within 10 days. You may be given medicines to relieve symptoms. They include: ? Medicines that shrink swollen tissue in the nose. ? Medicines that treat allergies (antihistamines). ? A spray that treats swelling of the nostrils. ? Rinses that help get rid of thick mucus in your nose (nasal saline washes).  If caused by bacteria, your doctor may wait to see if you will get better without treatment. You may be given antibiotic medicine if you have: ? A very bad infection. ? A weak body defense system.  If caused by growths in the nose, you may need to have surgery. Follow these instructions at home: Medicines  Take, use, or apply over-the-counter and prescription medicines only as told by your doctor.  These may include nasal sprays.  If you were prescribed an antibiotic medicine, take it as told by your doctor. Do not stop taking the antibiotic even if you start to feel better. Hydrate and humidify  Drink enough water to keep your pee (urine) pale yellow.  Use a cool mist humidifier to keep the humidity level in your home above 50%.  Breathe in steam for 10-15 minutes, 3-4 times a day, or as told by your doctor. You can do this in the bathroom while a hot shower is running.  Try not to spend time in cool or dry air.   Rest  Rest as much as you can.  Sleep with your head raised (elevated).  Make sure you get enough sleep each night. General instructions  Put a warm, moist washcloth on your face 3-4 times a day, or as often as told by your doctor. This will help with discomfort.  Wash your hands often with soap and water. If there is no soap and water, use hand sanitizer.  Do not smoke. Avoid being around people who are smoking (secondhand smoke).  Keep all follow-up visits as told by your doctor. This is important.   Contact a doctor if:  You have a fever.  Your symptoms get worse.  Your symptoms do not get better within 10 days. Get help right away if:  You have a very bad headache.  You cannot stop throwing up (vomiting).  You have very bad pain or swelling around your face or eyes.  You have trouble seeing.  You feel confused.  Your neck is stiff.  You have trouble breathing. Summary  Sinusitis is swelling of your sinuses. Sinuses are hollow spaces in the bones around your face.  This condition is caused by tissues in your nose that become inflamed or swollen. This traps germs. These can lead to infection.  If you were prescribed an antibiotic medicine, take it as told by your doctor. Do not stop taking it even if you start to feel better.  Keep all follow-up visits as told by your doctor. This is important. This information is not intended to replace  advice given to you by your health care provider. Make sure you discuss any questions you have with your health care provider. Document Revised: 06/11/2017 Document Reviewed: 06/11/2017 Elsevier Patient Education  2021 Elsevier Inc.      Oscar Caroli, MD [facility]

## 2020-03-18 NOTE — Patient Instructions (Addendum)
If you have lab work done today you will be contacted with your lab results within the next 2 weeks.  If you have not heard from [place] then please contact us. The fastest way to get your results is to register for My Chart.   IF you received an x-ray today, you will receive an invoice from [facility]. Please contact [facility]iology at [phone] with questions or concerns regarding your invoice.   IF you received labwork today, you will receive an invoice from [facility]. Please contact [facility] at [phone] with questions or concerns regarding your invoice.   Our billing staff will not be able to assist you with questions regarding bills from these companies.  You will be contacted with the lab results as soon as they are available. The fastest way to get your results is to activate your My Chart account. Instructions are located on the last page of this paperwork. If you have not heard from [place] regarding the results in 2 weeks, please contact this office.     Sinusitis, Adult Sinusitis is soreness and swelling (inflammation) of your sinuses. Sinuses are hollow spaces in the bones around your face. They are located:  Around your eyes.  In the middle of your forehead.  Behind your nose.  In your cheekbones. Your sinuses and nasal passages are lined with a fluid called mucus. Mucus drains out of your sinuses. Swelling can trap mucus in your sinuses. This lets germs (bacteria, virus, or fungus) grow, which leads to infection. Most of the time, this condition is caused by a virus. What are the causes? This condition is caused by:  Allergies.  Asthma.  Germs.  Things that block your nose or sinuses.  Growths in the nose (nasal polyps).  Chemicals or irritants in the air.  Fungus (rare). What increases the risk? You are more likely to develop this condition if:  You have a weak body defense system (immune system).  You do a lot of swimming or  diving.  You use nasal sprays too much.  You smoke. What are the signs or symptoms? The main symptoms of this condition are pain and a feeling of pressure around the sinuses. Other symptoms include:  Stuffy nose (congestion).  Runny nose (drainage).  Swelling and warmth in the sinuses.  Headache.  Toothache.  A cough that may get worse at night.  Mucus that collects in the throat or the back of the nose (postnasal drip).  Being unable to smell and taste.  Being very tired (fatigue).  A fever.  Sore throat.  Bad breath. How is this diagnosed? This condition is diagnosed based on:  Your symptoms.  Your medical history.  A physical exam.  Tests to find out if your condition is short-term (acute) or long-term (chronic). Your doctor may: ? Check your nose for growths (polyps). ? Check your sinuses using a tool that has a light (endoscope). ? Check for allergies or germs. ? Do imaging tests, such as an MRI or CT scan. How is this treated? Treatment for this condition depends on the cause and whether it is short-term or long-term.  If caused by a virus, your symptoms should go away on their own within 10 days. You may be given medicines to relieve symptoms. They include: ? Medicines that shrink swollen tissue in the nose. ? Medicines that treat allergies (antihistamines). ? A spray that treats swelling of the nostrils. ? Rinses that help get rid of thick mucus in your  nose (nasal saline washes).  If caused by bacteria, your doctor may wait to see if you will get better without treatment. You may be given antibiotic medicine if you have: ? A very bad infection. ? A weak body defense system.  If caused by growths in the nose, you may need to have surgery. Follow these instructions at home: Medicines  Take, use, or apply over-the-counter and prescription medicines only as told by your doctor. These may include nasal sprays.  If you were prescribed an antibiotic  medicine, take it as told by your doctor. Do not stop taking the antibiotic even if you start to feel better. Hydrate and humidify  Drink enough water to keep your pee (urine) pale yellow.  Use a cool mist humidifier to keep the humidity level in your home above 50%.  Breathe in steam for 10-15 minutes, 3-4 times a day, or as told by your doctor. You can do this in the bathroom while a hot shower is running.  Try not to spend time in cool or dry air.   Rest  Rest as much as you can.  Sleep with your head raised (elevated).  Make sure you get enough sleep each night. General instructions  Put a warm, moist washcloth on your face 3-4 times a day, or as often as told by your doctor. This will help with discomfort.  Wash your hands often with soap and water. If there is no soap and water, use hand sanitizer.  Do not smoke. Avoid being around people who are smoking (secondhand smoke).  Keep all follow-up visits as told by your doctor. This is important.   Contact a doctor if:  You have a fever.  Your symptoms get worse.  Your symptoms do not get better within 10 days. Get help right away if:  You have a very bad headache.  You cannot stop throwing up (vomiting).  You have very bad pain or swelling around your face or eyes.  You have trouble seeing.  You feel confused.  Your neck is stiff.  You have trouble breathing. Summary  Sinusitis is swelling of your sinuses. Sinuses are hollow spaces in the bones around your face.  This condition is caused by tissues in your nose that become inflamed or swollen. This traps germs. These can lead to infection.  If you were prescribed an antibiotic medicine, take it as told by your doctor. Do not stop taking it even if you start to feel better.  Keep all follow-up visits as told by your doctor. This is important. This information is not intended to replace advice given to you by your health care provider. Make sure you discuss  any questions you have with your health care provider. Document Revised: 06/11/2017 Document Reviewed: 06/11/2017 Elsevier Patient Education  2021 [employer].

## 2020-03-22 ENCOUNTER — Encounter: Payer: Self-pay | Admitting: Emergency Medicine

## 2020-03-22 DIAGNOSIS — Z1211 Encounter for screening for malignant neoplasm of colon: Secondary | ICD-10-CM

## 2020-03-23 ENCOUNTER — Other Ambulatory Visit: Payer: Self-pay

## 2020-03-23 ENCOUNTER — Ambulatory Visit (INDEPENDENT_AMBULATORY_CARE_PROVIDER_SITE_OTHER): Payer: Medicare Other | Admitting: Otolaryngology

## 2020-03-23 VITALS — Temp 97.3°F

## 2020-03-23 DIAGNOSIS — J3489 Other specified disorders of nose and nasal sinuses: Secondary | ICD-10-CM

## 2020-03-23 DIAGNOSIS — J329 Chronic sinusitis, unspecified: Secondary | ICD-10-CM | POA: Diagnosis not present

## 2020-03-23 DIAGNOSIS — H6123 Impacted cerumen, bilateral: Secondary | ICD-10-CM

## 2020-03-23 NOTE — Progress Notes (Addendum)
HPI: Oscar Smith is a 75 y.o. male who returns today for evaluation of chronic sinus issues.  He has had recurrent sinus infections for a number of years.  He also chronically has trouble breathing through his nose.  I had seen him 6 months ago and had discussed possible surgical intervention as he has a severe septal deviation causing the nasal obstruction.  He returns today for recheck.  He had a recent sinus infection and was just recently treated with a course of amoxicillin.  He uses Flonase regularly.  His nasal obstruction is worse at night when he sleeps. I reviewed the previous CT scan performed September of last year that showed opacification of the left ethmoid and left frontal sinus..  Past Medical History:  Diagnosis Date  . Adenocarcinoma of prostate [facility]) urologist-  dr Tresa Moore--  dx Nov 2011--  T1c, Gleason 3+3,  PSA 4.2 (post IMRT 03/2010   Low risk recurrence, Biochemical control,  last PSA 0.30 on 05-28-2014  . Atherosclerosis   . BPH (benign prostatic hyperplasia)   . Cancer ([place])   . Diverticulitis   . ED (erectile dysfunction) of organic origin   . GERD (gastroesophageal reflux disease)   . Hiatal hernia   . History of colon polyps    2002 and 2003 hyperplastic  . History of gastritis   . Hypertension   . Lower urinary tract symptoms (LUTS)   . Phimosis   . Sleep apnea    did not complete study.    . Wears hearing aid    bilateral   Past Surgical History:  Procedure Laterality Date  . ABDOMINAL HERNIA REPAIR    . CIRCUMCISION N/A 06/09/2015   Procedure: CIRCUMCISION ADULT WITH PENILE BLOCK;  Surgeon: Alexis Frock, MD;  Location: [facility];  Service: Urology;  Laterality: N/A;  . COLONOSCOPY  last one 11-15-2011  . ENDOVENOUS ABLATION SAPHENOUS VEIN W/ LASER Left 02/10/2016   endovenous laser ablation left greater saphenous vein and stab phlebectomy left leg by Curt Jews MD  . HERNIA REPAIR N/A    Phreesia 07/11/2019  . INGUINAL  HERNIA REPAIR Right 12-23-2009  . left shoulder surgery    . LIPOMA EXCISION N/A 12/01/2016   Procedure: EXCISION POSTERIOR NECK MASS;  Surgeon: Irene Limbo, MD;  Location: [place];  Service: Plastics;  Laterality: N/A;  . PROSTATE SURGERY  2009  . PROSTATE SURGERY    . SHOULDER ARTHROSCOPY WITH SUBACROMIAL DECOMPRESSION, ROTATOR CUFF REPAIR AND BICEP TENDON REPAIR Left 10-2014   Social History   Socioeconomic History  . Marital status: Married    Spouse name: Not on file  . Number of children: 0  . Years of education: BS  . Highest education level: Not on file  Occupational History  . Occupation: Development worker, community  . Occupation: Chief Financial Officer  Tobacco Use  . Smoking status: Former Smoker    Packs/day: 0.50    Years: 2.00    Pack years: 1.00    Types: Cigarettes    Quit date: 09/04/1965    Years since quitting: 54.5  . Smokeless tobacco: Never Used  . Tobacco comment: quit around 75 yrs old.  Vaping Use  . Vaping Use: Never used  Substance and Sexual Activity  . Alcohol use: Yes    Alcohol/week: 20.0 standard drinks    Types: 20 Standard drinks or equivalent per week    Comment: drink 1-2 at night   . Drug use: No  . Sexual activity: Yes  Partners: Female  Other Topics Concern  . Not on file  Social History Narrative   ** Merged History Encounter **       Originally from [place].   Trained as an Chief Financial Officer.    Married '75- 64 years, divorced; Married '86. No children.    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 Sculptor-life size figures    by commission (see work [place]  Sculpting work regularly.   Never smoking.   Alcohol: drinks1- 2 beers and 1-2 wines every night;    Learning the piano. Patient get regular exercise.   Wife is a Marine scientist.   Caffeine: 3/day    ADLs: drives; independent with ADLs   Advanced Directives: YES; FULL CODE; no prolonged measures.         Social Determinants of Health   Financial Resource Strain: Not on file  Food Insecurity: Not on file  Transportation Needs: Not on file  Physical Activity: Not on file  Stress: Not on file  Social Connections: Not on file   Family History  Problem Relation Age of Onset  . Cancer Father 75       stomach versus colon cancer  . Pancreatic cancer Sister 4  . Cancer Brother        penile cancer  . Esophageal cancer Neg Hx   . Throat cancer Neg Hx    Allergies  Allergen Reactions  . Other   . Flomax [Tamsulosin Hcl] Other (See Comments)    "bad reaction" = jaw pain  . Latex Itching and Rash   Prior to Admission medications   Medication Sig Start Date End Date Taking? Authorizing Provider  amLODipine (NORVASC) 5 MG tablet Take 1 tablet (5 mg total) by mouth in the morning and at bedtime. 12/02/19 11/26/20  Donato Heinz, MD  amoxicillin-clavulanate (AUGMENTIN) 875-125 MG tablet Take 1 tablet by mouth 2 (two) times daily for 7 days. 03/18/20 03/25/20  Horald Pollen, MD  aspirin EC 81 MG tablet Take 81 mg daily by mouth.    [provider]  atorvastatin (LIPITOR) 20 MG tablet Take 1 tablet (20 mg total) by mouth daily. Patient taking differently: Take 20 mg by mouth at bedtime. 01/07/20   Donato Heinz, MD  diphenhydramine-acetaminophen (TYLENOL PM) 25-500 MG TABS tablet Take 1 tablet by mouth at bedtime as needed (for sleep).    [provider]  fluticasone (FLONASE) 50 MCG/ACT nasal spray Place 2 sprays into both nostrils daily. 12/12/19   Horald Pollen, MD  guaiFENesin (MUCINEX) 600 MG 12 hr tablet Take 1 tablet (600 mg total) by mouth 2 (two) times daily for 7 days. 03/18/20 03/25/20  Horald Pollen, MD  lisinopril (ZESTRIL) 40 MG tablet TAKE 1 TABLET BY MOUTH DAILY. 02/15/20   Horald Pollen, MD  meclizine (ANTIVERT) 25 MG tablet Take 1 tablet (25 mg total) by mouth 3 (three) times daily as needed for up to 25 doses for  dizziness. 05/06/19   Wyvonnia Dusky, MD  Multiple Vitamin (MULTIVITAMIN) tablet Take 1 tablet by mouth daily.    [provider]  omeprazole (PRILOSEC) 20 MG capsule TAKE 1 CAPSULE 15-20 MINUTES BEFORE BREAKFAST. 01/30/20   Horald Pollen, MD     Positive ROS: Otherwise negative  All other systems have been reviewed and were otherwise negative with the exception of those mentioned in the HPI and as above.  Physical Exam: Constitutional: Alert,  well-appearing, no acute distress Ears: External ears without lesions or tenderness.  Wears bilateral hearing aids and has wax buildup in both ear canals right side worse than the left.  This was cleaned in the office using curette forceps and suction.  TMs were clear otherwise. Nasal: External nose without lesions. Septum severely deviated to the left with obstruction of left nasal cavity.  No polyps noted.  No obvious mucopurulent discharge noted on exam today..  Of note he does have tendency for nasal valve collapse. Oral: Lips and gums without lesions. Tongue and palate mucosa without lesions. Posterior oropharynx clear. Neck: No palpable adenopathy or masses Respiratory: Breathing comfortably  Skin: No facial/neck lesions or rash noted.  Cerumen impaction removal  Date/Time: 03/23/2020 2:27 PM Performed by: Rozetta Nunnery, MD Authorized by: Rozetta Nunnery, MD   Consent:    Consent obtained:  Verbal   Consent given by:  Patient   Risks discussed:  Pain and bleeding Procedure details:    Location:  L ear and R ear   Procedure type: curette, suction and forceps   Post-procedure details:    Inspection:  TM intact and canal normal   Hearing quality:  Improved   Patient tolerance of procedure:  Tolerated well, no immediate complications Comments:     TMs are clear bilaterally.    Assessment: Septal deviation with nasal valve collapse. History of recurrent sinusitis.  Plan: Patient is getting ready to go  on a 2-week trip to [place].  I discussed with him concerning obtaining a CT scan of the sinuses to better evaluate the sinuses prior to any surgical intervention.  He will follow-up here following the CT scan of the sinuses to discuss surgical intervention which will require septoplasty turbinate reductions and nasal vestibuloplasty along with FESS depending on results of CT scan.   Radene Journey, MD

## 2020-03-26 ENCOUNTER — Other Ambulatory Visit (INDEPENDENT_AMBULATORY_CARE_PROVIDER_SITE_OTHER): Payer: Self-pay

## 2020-03-26 DIAGNOSIS — J329 Chronic sinusitis, unspecified: Secondary | ICD-10-CM

## 2020-03-29 DIAGNOSIS — R350 Frequency of micturition: Secondary | ICD-10-CM | POA: Diagnosis not present

## 2020-03-29 DIAGNOSIS — R3912 Poor urinary stream: Secondary | ICD-10-CM | POA: Diagnosis not present

## 2020-03-29 DIAGNOSIS — Z1211 Encounter for screening for malignant neoplasm of colon: Secondary | ICD-10-CM | POA: Diagnosis not present

## 2020-04-06 LAB — COLOGUARD: COLOGUARD: NEGATIVE

## 2020-04-09 ENCOUNTER — Encounter: Payer: Self-pay | Admitting: Emergency Medicine

## 2020-04-09 LAB — COLOGUARD: Cologuard: NEGATIVE

## 2020-04-09 NOTE — Telephone Encounter (Signed)
Call patient please.  Negative Cologuard.  Thanks.

## 2020-04-10 ENCOUNTER — Ambulatory Visit
Admission: RE | Admit: 2020-04-10 | Discharge: 2020-04-10 | Disposition: A | Discharge status: Home / Self Care | Payer: Medicare Other | Source: Ambulatory Visit | Attending: Otolaryngology | Admitting: Otolaryngology

## 2020-04-10 ENCOUNTER — Other Ambulatory Visit: Payer: Self-pay

## 2020-04-10 DIAGNOSIS — J329 Chronic sinusitis, unspecified: Secondary | ICD-10-CM

## 2020-04-10 DIAGNOSIS — J320 Chronic maxillary sinusitis: Secondary | ICD-10-CM | POA: Diagnosis not present

## 2020-04-10 DIAGNOSIS — J3489 Other specified disorders of nose and nasal sinuses: Secondary | ICD-10-CM | POA: Diagnosis not present

## 2020-04-10 DIAGNOSIS — J342 Deviated nasal septum: Secondary | ICD-10-CM | POA: Diagnosis not present

## 2020-04-12 ENCOUNTER — Other Ambulatory Visit: Payer: Self-pay | Admitting: Emergency Medicine

## 2020-04-12 MED ORDER — AMOXICILLIN-POT CLAVULANATE 875-125 MG PO TABS: 1.0000 | ORAL_TABLET | Freq: Two times a day (BID) | ORAL | 0 refills | Status: AC

## 2020-04-12 NOTE — Telephone Encounter (Signed)
Pt notes abx helped until they were done then sxs returned and pt is requesting another round abx please adise. Pt leaving for [place] soon.

## 2020-04-12 NOTE — Telephone Encounter (Signed)
New prescription for Augmentin sent.  Thanks.

## 2020-04-12 NOTE — Telephone Encounter (Signed)
He recently saw the ENT doctor.  Recommend to call them as well.  Thanks.

## 2020-04-13 DIAGNOSIS — Z20822 Contact with and (suspected) exposure to covid-19: Secondary | ICD-10-CM | POA: Diagnosis not present

## 2020-05-04 ENCOUNTER — Other Ambulatory Visit: Payer: Self-pay

## 2020-05-04 ENCOUNTER — Ambulatory Visit (INDEPENDENT_AMBULATORY_CARE_PROVIDER_SITE_OTHER): Payer: Medicare Other | Admitting: Otolaryngology

## 2020-05-04 ENCOUNTER — Encounter (INDEPENDENT_AMBULATORY_CARE_PROVIDER_SITE_OTHER): Payer: Self-pay | Admitting: Otolaryngology

## 2020-05-04 VITALS — Temp 97.2°F

## 2020-05-04 DIAGNOSIS — J322 Chronic ethmoidal sinusitis: Secondary | ICD-10-CM

## 2020-05-04 DIAGNOSIS — J342 Deviated nasal septum: Secondary | ICD-10-CM

## 2020-05-04 NOTE — Progress Notes (Signed)
HPI: Oscar Smith is a 75 y.o. male who returns today for evaluation of difficulty breathing through his nose.  He returns today with his wife to review the CT scan that was recently performed.  He has been doing much better since last visit 6 weeks ago with improvement of his breathing using the nasal steroid sprays Nasacort or Flonase.  He is having no pain or pressure in the sinus region.  No headaches or periorbital pain on either side.  Overall he feels like he is breathing much better.  Does describe some postnasal drainage but not blowing out any yellow-green discharge or coughing up any yellow-green discharge. I reviewed the CT scan in the office today with the patient as well as his wife.  Again this shows a moderate to severe deviation of the septum to the left along with opacification of the left ethmoid and left frontal sinus region..  Past Medical History:  Diagnosis Date  . Adenocarcinoma of prostate [facility]) urologist-  dr Tresa Moore--  dx Nov 2011--  T1c, Gleason 3+3,  PSA 4.2 (post IMRT 03/2010   Low risk recurrence, Biochemical control,  last PSA 0.30 on 05-28-2014  . Atherosclerosis   . BPH (benign prostatic hyperplasia)   . Cancer ([place])   . Diverticulitis   . ED (erectile dysfunction) of organic origin   . GERD (gastroesophageal reflux disease)   . Hiatal hernia   . History of colon polyps    2002 and 2003 hyperplastic  . History of gastritis   . Hypertension   . Lower urinary tract symptoms (LUTS)   . Phimosis   . Sleep apnea    did not complete study.    . Wears hearing aid    bilateral   Past Surgical History:  Procedure Laterality Date  . ABDOMINAL HERNIA REPAIR    . CIRCUMCISION N/A 06/09/2015   Procedure: CIRCUMCISION ADULT WITH PENILE BLOCK;  Surgeon: Alexis Frock, MD;  Location[facility];  Service: Urology;  Laterality: N/A;  . COLONOSCOPY  last one 11-15-2011  . ENDOVENOUS ABLATION SAPHENOUS VEIN W/ LASER Left 02/10/2016   endovenous  laser ablation left greater saphenous vein and stab phlebectomy left leg by Curt Jews MD  . HERNIA REPAIR N/A    Phreesia 07/11/2019  . INGUINAL HERNIA REPAIR Right 12-23-2009  . left shoulder surgery    . LIPOMA EXCISION N/A 12/01/2016   Procedure: EXCISION POSTERIOR NECK MASS;  Surgeon: Irene Limbo, MD;  Location: [place];  Service: Plastics;  Laterality: N/A;  . PROSTATE SURGERY  2009  . PROSTATE SURGERY    . SHOULDER ARTHROSCOPY WITH SUBACROMIAL DECOMPRESSION, ROTATOR CUFF REPAIR AND BICEP TENDON REPAIR Left 10-2014   Social History   Socioeconomic History  . Marital status: Married    Spouse name: Not on file  . Number of children: 0  . Years of education: BS  . Highest education level: Not on file  Occupational History  . Occupation: Development worker, community  . Occupation: Chief Financial Officer  Tobacco Use  . Smoking status: Former Smoker    Packs/day: 0.50    Years: 2.00    Pack years: 1.00    Types: Cigarettes    Quit date: 09/04/1965    Years since quitting: 54.7  . Smokeless tobacco: Never Used  . Tobacco comment: quit around 75 yrs old.  Vaping Use  . Vaping Use: Never used  Substance and Sexual Activity  . Alcohol use: Yes    Alcohol/week: 20.0 standard drinks  Types: 20 Standard drinks or equivalent per week    Comment: drink 1-2 at night   . Drug use: No  . Sexual activity: Yes    Partners: Female  Other Topics Concern  . Not on file  Social History Narrative   ** Merged History Encounter **       Originally from [place].   Trained as an Chief Financial Officer.    Married '75- 69 years, divorced; Married '86. No children.    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 Sculptor-life size figures    by commission (see work in [place]  Sculpting work regularly.   Never smoking.   Alcohol: drinks1- 2 beers and 1-2 wines every night;    Learning the piano. Patient get regular exercise.    Wife is a Marine scientist.   Caffeine: 3/day    ADLs: drives; independent with ADLs   Advanced Directives: YES; FULL CODE; no prolonged measures.        Social Determinants of Health   Financial Resource Strain: Not on file  Food Insecurity: Not on file  Transportation Needs: Not on file  Physical Activity: Not on file  Stress: Not on file  Social Connections: Not on file   Family History  Problem Relation Age of Onset  . Cancer Father 89       stomach versus colon cancer  . Pancreatic cancer Sister 66  . Cancer Brother        penile cancer  . Esophageal cancer Neg Hx   . Throat cancer Neg Hx    Allergies  Allergen Reactions  . Other   . Flomax [Tamsulosin Hcl] Other (See Comments)    "bad reaction" = jaw pain  . Latex Itching and Rash   Prior to Admission medications   Medication Sig Start Date End Date Taking? Authorizing Provider  amLODipine (NORVASC) 5 MG tablet Take 1 tablet (5 mg total) by mouth in the morning and at bedtime. 12/02/19 11/26/20  Donato Heinz, MD  aspirin EC 81 MG tablet Take 81 mg daily by mouth.    [provider]  atorvastatin (LIPITOR) 20 MG tablet Take 1 tablet (20 mg total) by mouth daily. Patient taking differently: Take 20 mg by mouth at bedtime. 01/07/20   Donato Heinz, MD  diphenhydramine-acetaminophen (TYLENOL PM) 25-500 MG TABS tablet Take 1 tablet by mouth at bedtime as needed (for sleep).    [provider]  fluticasone (FLONASE) 50 MCG/ACT nasal spray Place 2 sprays into both nostrils daily. 12/12/19   Horald Pollen, MD  lisinopril (ZESTRIL) 40 MG tablet TAKE 1 TABLET BY MOUTH DAILY. 02/15/20   Horald Pollen, MD  meclizine (ANTIVERT) 25 MG tablet Take 1 tablet (25 mg total) by mouth 3 (three) times daily as needed for up to 25 doses for dizziness. 05/06/19   Wyvonnia Dusky, MD  Multiple Vitamin (MULTIVITAMIN) tablet Take 1 tablet by mouth daily.    [provider]  omeprazole  (PRILOSEC) 20 MG capsule TAKE 1 CAPSULE 15-20 MINUTES BEFORE BREAKFAST. 01/30/20   Horald Pollen, MD     Positive ROS: Otherwise negative  All other systems have been reviewed and were otherwise negative with the exception of those mentioned in the HPI and as above.  Physical Exam: Constitutional: Alert, well-appearing, no acute distress Ears: External ears without lesions or tenderness. Ear canals are clear bilaterally with intact, clear TMs.  Nasal: External nose without  lesions. Septum is deviated severely to the left.  The middle meatus on the left side is edematous but no gross mucopurulent discharge noted and no polyps noted. Oral: Lips and gums without lesions. Tongue and palate mucosa without lesions. Posterior oropharynx clear. Neck: No palpable adenopathy or masses Respiratory: Breathing comfortably  Skin: No facial/neck lesions or rash noted.  Procedures  Assessment: Severe septal deviation to the left with intermittent nasal obstruction.  Opacification of the left ethmoid and left frontal sinuses on recent CT scan.  This is otherwise asymptomatic.  Plan: Since he is doing much better presently on the nasal steroid sprays and saline rinses would recommend continuation with this.  Briefly reviewed with him concerning surgical options which would involve septoplasty turbinate reductions and left FESS.  However if he is symptomatically doing much better on the medical therapy which consist of nasal steroid sprays on a regular basis and saline rinses would recommend continuation with this. He develops any yellow-green discharge from his nose or pain or pressure around the left eye left forehead area I gave her a prescription for Augmentin 875 mg twice daily for 10 days to take.  If symptoms become worse could consider surgical intervention at a later date if needed. He will follow-up as needed.   Radene Journey, MD

## 2020-05-17 ENCOUNTER — Other Ambulatory Visit: Payer: Self-pay | Admitting: Emergency Medicine

## 2020-05-31 DIAGNOSIS — R350 Frequency of micturition: Secondary | ICD-10-CM | POA: Diagnosis not present

## 2020-06-02 ENCOUNTER — Other Ambulatory Visit: Payer: Self-pay | Admitting: Urology

## 2020-06-03 ENCOUNTER — Other Ambulatory Visit: Payer: Self-pay

## 2020-06-03 ENCOUNTER — Encounter (HOSPITAL_BASED_OUTPATIENT_CLINIC_OR_DEPARTMENT_OTHER): Payer: Self-pay | Admitting: Urology

## 2020-06-03 NOTE — Progress Notes (Signed)
Spoke w/ via phone for pre-op interview---pt Lab needs dos----  I stat             Lab results------see below COVID test ------06-07-2020 900 Arrive at -------1200 06-09-2020 NPO after MN NO Solid Food.  Clear liquids from MN until---1100 am then npo Med rec completed Medications to take morning of surgery -----amlodipine, omeprazole Diabetic medication -----n/a Patient instructed to bring photo id and insurance card day of surgery Patient aware to have Driver (ride ) / caregiver  Wife dee will stay   for 24 hours after surgery  Patient Special Instructions -----none Pre-Op special Istructions -----none Patient verbalized understanding of instructions that were given at this phone interview. Patient denies shortness of breath, chest pain, fever, cough at this phone interview.  Pt stopped 81 mg aspirin 05-31-2020 per dr Tresa Moore instructions Ct cardiac scoring 01-05-2020 epic Echo 05-27-2019 epic lov cardiology dr Gardiner Rhyme 12-02-2019 epic

## 2020-06-07 ENCOUNTER — Other Ambulatory Visit (HOSPITAL_COMMUNITY)
Admission: RE | Admit: 2020-06-07 | Discharge: 2020-06-07 | Disposition: A | Discharge status: Home / Self Care | Payer: Medicare Other | Source: Ambulatory Visit | Attending: Urology | Admitting: Urology

## 2020-06-07 DIAGNOSIS — Z01812 Encounter for preprocedural laboratory examination: Secondary | ICD-10-CM | POA: Insufficient documentation

## 2020-06-07 DIAGNOSIS — Z20822 Contact with and (suspected) exposure to covid-19: Secondary | ICD-10-CM | POA: Diagnosis not present

## 2020-06-07 LAB — SARS CORONAVIRUS 2 (TAT 6-24 HRS): SARS Coronavirus 2: NEGATIVE

## 2020-06-09 ENCOUNTER — Other Ambulatory Visit: Payer: Self-pay

## 2020-06-09 ENCOUNTER — Ambulatory Visit (HOSPITAL_BASED_OUTPATIENT_CLINIC_OR_DEPARTMENT_OTHER): Payer: Medicare Other | Admitting: Certified Registered Nurse Anesthetist

## 2020-06-09 ENCOUNTER — Encounter (HOSPITAL_BASED_OUTPATIENT_CLINIC_OR_DEPARTMENT_OTHER)
Admission: RE | Disposition: A | Discharge status: Home / Self Care | Payer: Self-pay | Source: Home / Self Care | Attending: Urology

## 2020-06-09 ENCOUNTER — Ambulatory Visit (HOSPITAL_BASED_OUTPATIENT_CLINIC_OR_DEPARTMENT_OTHER)
Admission: RE | Admit: 2020-06-09 | Discharge: 2020-06-09 | Disposition: A | Discharge status: Home / Self Care | Payer: Medicare Other | Attending: Urology | Admitting: Urology

## 2020-06-09 ENCOUNTER — Encounter (HOSPITAL_BASED_OUTPATIENT_CLINIC_OR_DEPARTMENT_OTHER): Payer: Self-pay | Admitting: Urology

## 2020-06-09 DIAGNOSIS — K219 Gastro-esophageal reflux disease without esophagitis: Secondary | ICD-10-CM | POA: Diagnosis not present

## 2020-06-09 DIAGNOSIS — Z800 Family history of malignant neoplasm of digestive organs: Secondary | ICD-10-CM | POA: Diagnosis not present

## 2020-06-09 DIAGNOSIS — Z8546 Personal history of malignant neoplasm of prostate: Secondary | ICD-10-CM | POA: Diagnosis not present

## 2020-06-09 DIAGNOSIS — C602 Malignant neoplasm of body of penis: Secondary | ICD-10-CM | POA: Insufficient documentation

## 2020-06-09 DIAGNOSIS — Z79899 Other long term (current) drug therapy: Secondary | ICD-10-CM | POA: Diagnosis not present

## 2020-06-09 DIAGNOSIS — Z8049 Family history of malignant neoplasm of other genital organs: Secondary | ICD-10-CM | POA: Insufficient documentation

## 2020-06-09 DIAGNOSIS — R339 Retention of urine, unspecified: Secondary | ICD-10-CM | POA: Diagnosis not present

## 2020-06-09 DIAGNOSIS — Z923 Personal history of irradiation: Secondary | ICD-10-CM | POA: Insufficient documentation

## 2020-06-09 DIAGNOSIS — R350 Frequency of micturition: Secondary | ICD-10-CM | POA: Diagnosis not present

## 2020-06-09 DIAGNOSIS — Z888 Allergy status to other drugs, medicaments and biological substances status: Secondary | ICD-10-CM | POA: Insufficient documentation

## 2020-06-09 DIAGNOSIS — A630 Anogenital (venereal) warts: Secondary | ICD-10-CM | POA: Diagnosis present

## 2020-06-09 DIAGNOSIS — Z87891 Personal history of nicotine dependence: Secondary | ICD-10-CM | POA: Insufficient documentation

## 2020-06-09 DIAGNOSIS — N401 Enlarged prostate with lower urinary tract symptoms: Secondary | ICD-10-CM | POA: Insufficient documentation

## 2020-06-09 DIAGNOSIS — I10 Essential (primary) hypertension: Secondary | ICD-10-CM | POA: Diagnosis not present

## 2020-06-09 DIAGNOSIS — Z91040 Latex allergy status: Secondary | ICD-10-CM | POA: Diagnosis not present

## 2020-06-09 DIAGNOSIS — E7800 Pure hypercholesterolemia, unspecified: Secondary | ICD-10-CM | POA: Diagnosis not present

## 2020-06-09 HISTORY — PX: CONDYLOMA EXCISION/FULGURATION: SHX1389

## 2020-06-09 HISTORY — DX: Anogenital (venereal) warts: A63.0

## 2020-06-09 HISTORY — DX: Presence of external hearing-aid: Z97.4

## 2020-06-09 HISTORY — DX: Presence of spectacles and contact lenses: Z97.3

## 2020-06-09 LAB — POCT I-STAT, CHEM 8
BUN: 9 mg/dL (ref 8–23)
Calcium, Ion: 1.2 mmol/L (ref 1.15–1.40)
Chloride: 101 mmol/L (ref 98–111)
Creatinine, Ser: 0.8 mg/dL (ref 0.61–1.24)
Glucose, Bld: 92 mg/dL (ref 70–99)
HCT: 43 % (ref 39.0–52.0)
Hemoglobin: 14.6 g/dL (ref 13.0–17.0)
Potassium: 4.1 mmol/L (ref 3.5–5.1)
Sodium: 135 mmol/L (ref 135–145)
TCO2: 24 mmol/L (ref 22–32)

## 2020-06-09 SURGERY — REMOVAL, CONDYLOMA
Anesthesia: General

## 2020-06-09 MED ORDER — LIDOCAINE 2% (20 MG/ML) 5 ML SYRINGE
INTRAMUSCULAR | Status: DC | PRN
  Administered 2020-06-09: 80 mg via INTRAVENOUS

## 2020-06-09 MED ORDER — CLINDAMYCIN PHOSPHATE 600 MG/50ML IV SOLN
INTRAVENOUS | Status: AC
  Filled 2020-06-09: qty 50

## 2020-06-09 MED ORDER — SENNOSIDES-DOCUSATE SODIUM 8.6-50 MG PO TABS: 1.0000 | ORAL_TABLET | Freq: Two times a day (BID) | ORAL | 0 refills | Status: DC

## 2020-06-09 MED ORDER — BACITRACIN ZINC 500 UNIT/GM EX OINT
TOPICAL_OINTMENT | CUTANEOUS | Status: DC | PRN
  Administered 2020-06-09: 1 via TOPICAL

## 2020-06-09 MED ORDER — DEXAMETHASONE SODIUM PHOSPHATE 10 MG/ML IJ SOLN
INTRAMUSCULAR | Status: AC
  Filled 2020-06-09: qty 1

## 2020-06-09 MED ORDER — CLINDAMYCIN PHOSPHATE 600 MG/50ML IV SOLN
600.0000 mg | INTRAVENOUS | Status: AC
  Administered 2020-06-09: 600 mg via INTRAVENOUS

## 2020-06-09 MED ORDER — PROPOFOL 10 MG/ML IV BOLUS
INTRAVENOUS | Status: DC | PRN
  Administered 2020-06-09: 150 mg via INTRAVENOUS

## 2020-06-09 MED ORDER — LACTATED RINGERS IV SOLN: INTRAVENOUS | Status: DC

## 2020-06-09 MED ORDER — ONDANSETRON HCL 4 MG/2ML IJ SOLN
INTRAMUSCULAR | Status: DC | PRN
  Administered 2020-06-09: 4 mg via INTRAVENOUS

## 2020-06-09 MED ORDER — ACETAMINOPHEN 160 MG/5ML PO SOLN
325.0000 mg | ORAL | Status: DC | PRN
Start: 2020-06-09 — End: 2020-06-09

## 2020-06-09 MED ORDER — ACETAMINOPHEN 325 MG PO TABS
325.0000 mg | ORAL_TABLET | ORAL | Status: DC | PRN
Start: 2020-06-09 — End: 2020-06-09

## 2020-06-09 MED ORDER — FENTANYL CITRATE (PF) 100 MCG/2ML IJ SOLN
INTRAMUSCULAR | Status: DC | PRN
  Administered 2020-06-09 (×2): 50 ug via INTRAVENOUS

## 2020-06-09 MED ORDER — OXYCODONE HCL 5 MG PO TABS: 5.0000 mg | ORAL_TABLET | Freq: Once | ORAL | Status: DC | PRN

## 2020-06-09 MED ORDER — ONDANSETRON HCL 4 MG/2ML IJ SOLN: 4.0000 mg | Freq: Once | INTRAMUSCULAR | Status: DC | PRN

## 2020-06-09 MED ORDER — TRAMADOL HCL 50 MG PO TABS: 50.0000 mg | ORAL_TABLET | Freq: Four times a day (QID) | ORAL | 0 refills | Status: DC | PRN

## 2020-06-09 MED ORDER — FENTANYL CITRATE (PF) 100 MCG/2ML IJ SOLN
INTRAMUSCULAR | Status: AC
  Filled 2020-06-09: qty 2

## 2020-06-09 MED ORDER — DEXAMETHASONE SODIUM PHOSPHATE 10 MG/ML IJ SOLN
INTRAMUSCULAR | Status: DC | PRN
  Administered 2020-06-09: 10 mg via INTRAVENOUS

## 2020-06-09 MED ORDER — OXYCODONE HCL 5 MG/5ML PO SOLN: 5.0000 mg | Freq: Once | ORAL | Status: DC | PRN

## 2020-06-09 MED ORDER — LIDOCAINE 2% (20 MG/ML) 5 ML SYRINGE
INTRAMUSCULAR | Status: AC
  Filled 2020-06-09: qty 5

## 2020-06-09 MED ORDER — FENTANYL CITRATE (PF) 100 MCG/2ML IJ SOLN: 25.0000 ug | INTRAMUSCULAR | Status: DC | PRN

## 2020-06-09 MED ORDER — BUPIVACAINE HCL 0.25 % IJ SOLN
INTRAMUSCULAR | Status: DC | PRN
  Administered 2020-06-09: 20 mL

## 2020-06-09 MED ORDER — ACETAMINOPHEN 10 MG/ML IV SOLN: 1000.0000 mg | Freq: Once | INTRAVENOUS | Status: DC | PRN

## 2020-06-09 MED ORDER — ONDANSETRON HCL 4 MG/2ML IJ SOLN
INTRAMUSCULAR | Status: AC
  Filled 2020-06-09: qty 2

## 2020-06-09 SURGICAL SUPPLY — 29 items
BLADE SURG 15 STRL LF DISP TIS (BLADE) ×1 IMPLANT
BLADE SURG 15 STRL SS (BLADE) ×2
BNDG COHESIVE 2X5 TAN STRL LF (GAUZE/BANDAGES/DRESSINGS) ×2 IMPLANT
BNDG CONFORM 2 STRL LF (GAUZE/BANDAGES/DRESSINGS) ×2 IMPLANT
COVER BACK TABLE 60X90IN (DRAPES) ×2 IMPLANT
COVER MAYO STAND STRL (DRAPES) ×2 IMPLANT
COVER WAND RF STERILE (DRAPES) ×2 IMPLANT
DRAPE LAPAROTOMY 100X72 PEDS (DRAPES) ×2 IMPLANT
ELECT NDL TIP 2.8 STRL (NEEDLE) IMPLANT
ELECT NEEDLE TIP 2.8 STRL (NEEDLE) IMPLANT
ELECT REM PT RETURN 9FT ADLT (ELECTROSURGICAL) ×2
ELECTRODE REM PT RTRN 9FT ADLT (ELECTROSURGICAL) ×1 IMPLANT
GAUZE XEROFORM 1X8 LF (GAUZE/BANDAGES/DRESSINGS) ×2 IMPLANT
GLOVE SURG ENC MOIS LTX SZ7.5 (GLOVE) ×2 IMPLANT
GOWN STRL REUS W/TWL LRG LVL3 (GOWN DISPOSABLE) ×2 IMPLANT
KIT TURNOVER CYSTO (KITS) ×2 IMPLANT
NDL HYPO 25X1 1.5 SAFETY (NEEDLE) ×1 IMPLANT
NEEDLE HYPO 25X1 1.5 SAFETY (NEEDLE) ×2 IMPLANT
NS IRRIG 500ML POUR BTL (IV SOLUTION) IMPLANT
PACK BASIN DAY SURGERY FS (CUSTOM PROCEDURE TRAY) ×2 IMPLANT
PENCIL SMOKE EVACUATOR (MISCELLANEOUS) ×2 IMPLANT
SUT VIC AB 3-0 SH 27 (SUTURE)
SUT VIC AB 3-0 SH 27X BRD (SUTURE) IMPLANT
SUT VIC AB 3-0 SH 27XBRD (SUTURE) IMPLANT
SUT VIC AB 4-0 PS2 18 (SUTURE) ×1 IMPLANT
SYR CONTROL 10ML LL (SYRINGE) ×2 IMPLANT
TOWEL OR 17X26 10 PK STRL BLUE (TOWEL DISPOSABLE) ×2 IMPLANT
TRAY DSU PREP LF (CUSTOM PROCEDURE TRAY) ×2 IMPLANT
WATER STERILE IRR 500ML POUR (IV SOLUTION) IMPLANT

## 2020-06-09 NOTE — H&P (Signed)
Oscar Smith is an 75 y.o. male.    Chief Complaint: Pre-Op Excision of Genital Condyloma  HPI:   1 - Low Risk Prostate Cancer - s/p IMRT 2012 (Dr. Valere Smith) for 4/12 cores Gleason 6 (Dr. Bernardo Smith). TRUS 87gm at time. Annual PSA x 10 years until 2022 at which point 0.13 / DRE scarrified    2 - Enlarged Prostate With Urinary Frequency, Incomplete Emptying - Long h/o obstructive and irritative LUTS. Now managed with PT regimen, Dietary modification. PVR's 75-150mL w/o prior frank retention. DRE 80gm +.    3 -Penile Shaft Lesion / Condyloma - left distal shaft / corona area are of irritation x few mos, exam cw small condyloma, about 32mm. Starting Aldara trial 03/2020. (1 packet twice weekly x 12 weeks) but not much help. Wants formal excision.   PMH unremarkable. His PCP is Oscar Caroli MD    Today Oscar Smith is seen to proceed with excision of genital condyloma for small volume warts that are refractory to topicals.    Past Medical History:  Diagnosis Date  . Adenocarcinoma of prostate Oscar Smith) urologist-  dr Oscar Smith--  dx Nov 2011--  T1c, Gleason 3+3,  PSA 4.2 (post IMRT 03/2010   Low risk recurrence, Biochemical control,  last PSA 0.30 on 05-28-2014  . Atherosclerosis   . BPH (benign prostatic hyperplasia)   . Cancer ([place])   . Diverticulitis no flare up in years   diet controlled  . ED (erectile dysfunction) of organic origin   . GERD (gastroesophageal reflux disease)   . Hiatal hernia   . History of colon polyps    2002 and 2003 hyperplastic  . History of gastritis   . Hypertension   . Penile wart   . Phimosis   . Wears glasses   . Wears hearing aid    bilateral  . Wears hearing aid in both ears     Past Surgical History:  Procedure Laterality Date  . CIRCUMCISION N/A 06/09/2015   Procedure: CIRCUMCISION ADULT WITH PENILE BLOCK;  Surgeon: Oscar Frock, MD;  Location: [facility];  Service: Urology;  Laterality: N/A;  . COLONOSCOPY  last one 11-15-2011   . CT RADIATION THERAPY GUIDE  2009   for prostate  . ENDOVENOUS ABLATION SAPHENOUS VEIN W/ LASER Left 02/10/2016   endovenous laser ablation left greater saphenous vein and stab phlebectomy left leg by Oscar Jews MD  . HERNIA REPAIR N/A    Phreesia 07/11/2019  . INGUINAL HERNIA REPAIR Right 12-23-2009  . LIPOMA EXCISION N/A 12/01/2016   Procedure: EXCISION POSTERIOR NECK MASS;  Surgeon: Oscar Limbo, MD;  Location: [place];  Service: Plastics;  Laterality: N/A;  . SHOULDER ARTHROSCOPY WITH SUBACROMIAL DECOMPRESSION, ROTATOR CUFF REPAIR AND BICEP TENDON REPAIR Left 10-2014    Family History  Problem Relation Age of Onset  . Cancer Father 5       stomach versus colon cancer  . Pancreatic cancer Sister 66  . Cancer Brother        penile cancer  . Esophageal cancer Neg Hx   . Throat cancer Neg Hx    Social History:  reports that he quit smoking about 54 years ago. His smoking use included cigarettes. He has a 1.00 pack-year smoking history. He has never used smokeless tobacco. He reports current alcohol use of about 20.0 standard drinks of alcohol per week. He reports that he does not use drugs.  Allergies:  Allergies  Allergen Reactions  . Other   .  Flomax [Tamsulosin Hcl] Other (See Comments)    "bad reaction" = jaw pain  . Latex Itching and Rash    No medications prior to admission.    Results for orders placed or performed during the Smith encounter of 06/07/20 (from the past 48 hour(s))  SARS CORONAVIRUS 2 (TAT 6-24 HRS) Nasopharyngeal Nasopharyngeal Swab     Status: None   Collection Time: 06/07/20  9:10 AM   Specimen: Nasopharyngeal Swab  Result Value Ref Range   SARS Coronavirus 2 NEGATIVE NEGATIVE    Comment: (NOTE) SARS-CoV-2 target nucleic acids are NOT DETECTED.  The SARS-CoV-2 RNA is generally detectable in upper and lower respiratory specimens during the acute phase of infection. Negative results do not preclude SARS-CoV-2  infection, do not rule out co-infections with other pathogens, and should not be used as the sole basis for treatment or other patient management decisions. Negative results must be combined with clinical observations, patient history, and epidemiological information. The expected result is Negative.  Fact Sheet for Patients: SugarRoll.be  Fact Sheet for Healthcare Providers: https://www.woods-mathews.com/  This test is not yet approved or cleared by the [place] FDA and  has been authorized for detection and/or diagnosis of SARS-CoV-2 by FDA under an Emergency Use Authorization (EUA). This EUA will remain  in effect (meaning this test can be used) for the duration of the COVID-19 declaration under Se ction 564(b)(1) of the Act, 21 U.S.C. section 360bbb-3(b)(1), unless the authorization is terminated or revoked sooner.  Performed at [facility], [place] [address], [place], [place] [address]    No results found.  Review of Systems  Constitutional: Negative for chills and fever.  All other systems reviewed and are negative.   Height 5\' 8"  (1.727 m), weight 82.6 kg. Physical Exam Vitals reviewed.  Constitutional:      Comments: Very pleasant, at baseline.   HENT:     Head: Normocephalic.     Mouth/Throat:     Mouth: Mucous membranes are moist.  Eyes:     Pupils: Pupils are equal, round, and reactive to light.  Cardiovascular:     Rate and Rhythm: Normal rate.  Pulmonary:     Effort: Pulmonary effort is normal.  Abdominal:     General: Abdomen is flat.  Genitourinary:    Comments: Stable abtou 29mm area of left dorsal shaft condyloma just proximal to corona.  Musculoskeletal:        General: Normal range of motion.     Cervical back: Normal range of motion.  Skin:    General: Skin is warm.  Neurological:     General: No focal deficit present.     Mental Status: He is alert.  Psychiatric:        Mood and Affect:  Mood normal.      Assessment/Plan Proceed as planned with primary exicison of penile condyloma. Risks, benefits, alternatives, expected peri-op course discussed previously and reiterated today.   Oscar Frock, MD 06/09/2020, 6:57 AM

## 2020-06-09 NOTE — Transfer of Care (Signed)
Immediate Anesthesia Transfer of Care Note  Patient: Oscar Smith  Procedure(s) Performed: EXCISION OF PENILE WARTS (N/A )  Patient Location: PACU  Anesthesia Type:General  Level of Consciousness: drowsy and patient cooperative  Airway & Oxygen Therapy: Patient Spontanous Breathing and Patient connected to face mask oxygen  Post-op Assessment: Report given to RN and Post -op Vital signs reviewed and stable  Post vital signs: Reviewed and stable  Last Vitals:  Vitals Value Taken Time  BP 109/72   Temp    Pulse 65 06/09/20 1415  Resp 10 06/09/20 1415  SpO2 100 % 06/09/20 1415  Vitals shown include unvalidated device data.  Last Pain:  Vitals:   06/09/20 1239  TempSrc: Oral  PainSc: 0-No pain      Patients Stated Pain Goal: 3 (17/71/16 5790)  Complications: No complications documented.

## 2020-06-09 NOTE — Discharge Instructions (Signed)
  Post Anesthesia Home Care Instructions  Activity: Get plenty of rest for the remainder of the day. A responsible adult should stay with you for 24 hours following the procedure.  For the next 24 hours, DO NOT: -Drive a car -Paediatric nurse -Drink alcoholic beverages -Take any medication unless instructed by your physician -Make any legal decisions or sign important papers.  Meals: Start with liquid foods such as gelatin or soup. Progress to regular foods as tolerated. Avoid greasy, spicy, heavy foods. If nausea and/or vomiting occur, drink only clear liquids until the nausea and/or vomiting subsides. Call your physician if vomiting continues.  Special Instructions/Symptoms: Your throat may feel dry or sore from the anesthesia or the breathing tube placed in your throat during surgery. If this causes discomfort, gargle with warm salt water. The discomfort should disappear within 24 hours.       1 - All stitches are dissolvable and will disappear over the next 2-3 weeks. You may shower starting tomorrow.  2 - No sexual stimulation or submersing area in pool / bath while stitches are still visible.   3 - Call MD or go to ER for fever >102, severe pain / nausea / vomiting not relieved by medications, or acute change in medical status

## 2020-06-09 NOTE — Anesthesia Preprocedure Evaluation (Signed)
Anesthesia Evaluation  Patient identified by MRN, date of birth, ID band Patient awake    Reviewed: Allergy & Precautions, NPO status , Patient's Chart, lab work & pertinent test results  Airway Mallampati: I       Dental  (+) Teeth Intact, Poor Dentition   Pulmonary former smoker,    Pulmonary exam normal        Cardiovascular hypertension, Pt. on medications Normal cardiovascular exam     Neuro/Psych  Neuromuscular disease negative psych ROS   GI/Hepatic Neg liver ROS, hiatal hernia, GERD  Medicated,  Endo/Other  negative endocrine ROS  Renal/GU negative Renal ROS  negative genitourinary   Musculoskeletal  (+) Arthritis , Osteoarthritis,    Abdominal Normal abdominal exam  (+)   Peds  Hematology negative hematology ROS (+)   Anesthesia Other Findings   Reproductive/Obstetrics                             Anesthesia Physical Anesthesia Plan  ASA: II  Anesthesia Plan: General   Post-op Pain Management:    Induction: Intravenous  PONV Risk Score and Plan: 2 and Ondansetron, Dexamethasone and Treatment may vary due to age or medical condition  Airway Management Planned: LMA  Additional Equipment: None  Intra-op Plan:   Post-operative Plan: Extubation in OR  Informed Consent: I have reviewed the patients History and Physical, chart, labs and discussed the procedure including the risks, benefits and alternatives for the proposed anesthesia with the patient or authorized representative who has indicated his/her understanding and acceptance.     Dental advisory given  Plan Discussed with: CRNA  Anesthesia Plan Comments:         Anesthesia Quick Evaluation

## 2020-06-09 NOTE — Anesthesia Postprocedure Evaluation (Signed)
Anesthesia Post Note  Patient: Oscar Smith  Procedure(s) Performed: EXCISION OF PENILE WARTS (N/A )     Patient location during evaluation: PACU Anesthesia Type: General Level of consciousness: awake and sedated Pain management: pain level controlled Vital Signs Assessment: post-procedure vital signs reviewed and stable Respiratory status: spontaneous breathing Cardiovascular status: stable Postop Assessment: no apparent nausea or vomiting Anesthetic complications: no   No complications documented.  Last Vitals:  Vitals:   06/09/20 1445 06/09/20 1500  BP: 138/74 138/79  Pulse: 62 67  Resp: 13 (!) 22  Temp:    SpO2: 97% 99%    Last Pain:  Vitals:   06/09/20 1500  TempSrc:   PainSc: 0-No pain                 John F Salome Arnt

## 2020-06-09 NOTE — Op Note (Signed)
NAME: Oscar Smith, [facility] MEDICAL RECORD NO: [id number] ACCOUNT NO: [id number] DATE OF BIRTH: 08-06-1945 FACILITY: [place] LOCATION: [facility] PHYSICIAN: Alexis Frock, MD  Operative Report   DATE OF PROCEDURE: 06/09/2020  PREOPERATIVE DIAGNOSIS:  Warty penile lesion.  POSTOPERATIVE DIAGNOSIS:  Warty penile lesion.  PROCEDURE:   1.  Penile block. 2.  Excision of penile lesion.  ESTIMATED BLOOD LOSS:  Nil.  COMPLICATIONS:  None.  SPECIMEN:  Warty penile lesion for pathology.  FINDINGS:  Approximately 13 mm squared area of warty penile skin excised.  INDICATIONS:  The patient is a very pleasant 75 year old man with a history of prostate cancer but has done well from this with biochemical cure, also history of phimosis, status post circumcision years ago.  He has had trouble with occasionally  recurrent small genital condyloma that previously responded fairly well to topical therapies.  He most recently had a outbreak over several months on the left dorsal penile shaft just proximal to corona of the glans.  He has been quite compliant with  topical therapy; however, this has been minimally responsive and even some small progression with additional satellite lesions.  Options were discussed including alternative topical therapy versus laser ablation techniques versus formal excision and he  wished to do the latter with goal of removal and histopathologic confirmation.  Informed consent was obtained, placed in the medical record.  DESCRIPTION OF PROCEDURE:  The patient being verified, procedure being excision of penile wart and penile block was confirmed.  Procedure timeout was performed.  Intravenous antibiotics administered.  General LMA anesthesia induced. The patient was  placed into a supine position.  Sterile field was created.  Prepped and draped the patient's penis, perineum and proximal thigh using iodine.  Attention was directed at penile block, 10 mL of 0.25% plain Marcaine  was placed in a ring block fashion at the base of the penis.   Additional 10 mL injected along the presumed course of the dorsal penile nerve.  Attention was directed at excision of penile warts.  An elliptical incision was made encompassing the dominant area of the condyloma and two small foci of satellite lesions  in total surface area approximately 13 mm squared.  This was excised down to the deeper subcutaneous tissue, but not involving the corporal tissue or tunica fascia and this elliptical skin lesion was set aside for permanent pathology.  Several small  vessels were coagulated with point coagulation current.  Scarpa's reapproximated using interrupted Vicryl x3 and the skin reapproximated using running Vicryl.  Hemostasis was excellent, cosmesis was good.  A dressing of bacitracin ointment was applied  and the procedure was terminated.  The patient tolerated the procedure well with no immediate perioperative complications.  The patient was taken to postanesthesia care unit in stable condition and plan for discharge home.   SHW D: 06/09/2020 2:09:37 pm T: 06/09/2020 10:02:00 pm  JOB: [id number]/ [id number]

## 2020-06-09 NOTE — Brief Op Note (Signed)
06/09/2020  2:05 PM  PATIENT:  Oscar Smith  75 y.o. male  PRE-OPERATIVE DIAGNOSIS:  PENILE WARTS  POST-OPERATIVE DIAGNOSIS:  * No post-op diagnosis entered *  PROCEDURE:  Procedure(s) with comments: EXCISION OF PENILE WARTS (N/A) - 45 MINS  SURGEON:  Surgeon(s) and Role:    * Alexis Frock, MD - Primary  PHYSICIAN ASSISTANT:   ASSISTANTS: none   ANESTHESIA:   local and general  EBL:  minimal   BLOOD ADMINISTERED:none  DRAINS: none   LOCAL MEDICATIONS USED:  MARCAINE     SPECIMEN:  Source of Specimen:  warty penile lesion  DISPOSITION OF SPECIMEN:  PATHOLOGY  COUNTS:  YES  TOURNIQUET:  * No tourniquets in log *  DICTATION: .Other Dictation: Dictation Number [id number]  PLAN OF CARE: Discharge to home after PACU  PATIENT DISPOSITION:  PACU - hemodynamically stable.   Delay start of Pharmacological VTE agent (>24hrs) due to surgical blood loss or risk of bleeding: yes

## 2020-06-09 NOTE — Anesthesia Procedure Notes (Signed)
Procedure Name: LMA Insertion Date/Time: 06/09/2020 1:43 PM Performed by: Genelle Bal, CRNA Pre-anesthesia Checklist: Patient identified, Emergency Drugs available, Suction available and Patient being monitored Patient Re-evaluated:Patient Re-evaluated prior to induction Oxygen Delivery Method: Circle system utilized Preoxygenation: Pre-oxygenation with 100% oxygen Induction Type: IV induction Ventilation: Mask ventilation without difficulty LMA: LMA inserted LMA Size: 4.0 Number of attempts: 1 Airway Equipment and Method: Bite block Placement Confirmation: positive ETCO2 Tube secured with: Tape Dental Injury: Teeth and Oropharynx as per pre-operative assessment

## 2020-06-10 ENCOUNTER — Encounter (HOSPITAL_BASED_OUTPATIENT_CLINIC_OR_DEPARTMENT_OTHER): Payer: Self-pay | Admitting: Urology

## 2020-06-22 LAB — SURGICAL PATHOLOGY

## 2020-06-28 DIAGNOSIS — R350 Frequency of micturition: Secondary | ICD-10-CM | POA: Diagnosis not present

## 2020-08-02 ENCOUNTER — Other Ambulatory Visit: Payer: Self-pay | Admitting: Emergency Medicine

## 2020-08-07 ENCOUNTER — Other Ambulatory Visit: Payer: Self-pay | Admitting: Cardiology

## 2020-08-25 ENCOUNTER — Other Ambulatory Visit: Payer: Self-pay | Admitting: Emergency Medicine

## 2020-09-15 ENCOUNTER — Ambulatory Visit (INDEPENDENT_AMBULATORY_CARE_PROVIDER_SITE_OTHER): Payer: Medicare Other | Admitting: Emergency Medicine

## 2020-09-15 ENCOUNTER — Other Ambulatory Visit: Payer: Self-pay

## 2020-09-15 ENCOUNTER — Encounter: Payer: Self-pay | Admitting: Emergency Medicine

## 2020-09-15 VITALS — BP 126/75 | HR 78 | Temp 97.8°F | Ht 68.0 in | Wt 184.0 lb

## 2020-09-15 DIAGNOSIS — R5383 Other fatigue: Secondary | ICD-10-CM | POA: Diagnosis not present

## 2020-09-15 DIAGNOSIS — I10 Essential (primary) hypertension: Secondary | ICD-10-CM | POA: Diagnosis not present

## 2020-09-15 DIAGNOSIS — E785 Hyperlipidemia, unspecified: Secondary | ICD-10-CM | POA: Diagnosis not present

## 2020-09-15 LAB — CBC WITH DIFFERENTIAL/PLATELET
Basophils Absolute: 0 10*3/uL (ref 0.0–0.1)
Basophils Relative: 0.6 % (ref 0.0–3.0)
Eosinophils Absolute: 0.1 10*3/uL (ref 0.0–0.7)
Eosinophils Relative: 1.9 % (ref 0.0–5.0)
HCT: 41.5 % (ref 39.0–52.0)
Hemoglobin: 14.4 g/dL (ref 13.0–17.0)
Lymphocytes Relative: 29 % (ref 12.0–46.0)
Lymphs Abs: 1.5 10*3/uL (ref 0.7–4.0)
MCHC: 34.7 g/dL (ref 30.0–36.0)
MCV: 90.1 fl (ref 78.0–100.0)
Monocytes Absolute: 0.6 10*3/uL (ref 0.1–1.0)
Monocytes Relative: 12 % (ref 3.0–12.0)
Neutro Abs: 2.8 10*3/uL (ref 1.4–7.7)
Neutrophils Relative %: 56.5 % (ref 43.0–77.0)
Platelets: 195 10*3/uL (ref 150.0–400.0)
RBC: 4.61 Mil/uL (ref 4.22–5.81)
RDW: 13.3 % (ref 11.5–15.5)
WBC: 5 10*3/uL (ref 4.0–10.5)

## 2020-09-15 LAB — LIPID PANEL
Cholesterol: 134 mg/dL (ref 0–200)
HDL: 42.5 mg/dL (ref >39.00)
LDL Cholesterol: 69 mg/dL (ref 0–99)
NonHDL: 91.01
Total CHOL/HDL Ratio: 3
Triglycerides: 112 mg/dL (ref 0.0–149.0)
VLDL: 22.4 mg/dL (ref 0.0–40.0)

## 2020-09-15 LAB — COMPREHENSIVE METABOLIC PANEL
ALT: 23 U/L (ref 0–53)
AST: 21 U/L (ref 0–37)
Albumin: 4.2 g/dL (ref 3.5–5.2)
Alkaline Phosphatase: 84 U/L (ref 39–117)
BUN: 11 mg/dL (ref 6–23)
CO2: 27 mEq/L (ref 19–32)
Calcium: 9.4 mg/dL (ref 8.4–10.5)
Chloride: 100 mEq/L (ref 96–112)
Creatinine, Ser: 0.86 mg/dL (ref 0.40–1.50)
GFR: 83.7 mL/min (ref >60.00)
Glucose, Bld: 74 mg/dL (ref 70–99)
Potassium: 4 mEq/L (ref 3.5–5.1)
Sodium: 135 mEq/L (ref 135–145)
Total Bilirubin: 0.8 mg/dL (ref 0.2–1.2)
Total Protein: 7 g/dL (ref 6.0–8.3)

## 2020-09-15 LAB — HEMOGLOBIN A1C: Hgb A1c MFr Bld: 5.2 % (ref 4.6–6.5)

## 2020-09-15 LAB — TSH: TSH: 1.24 u[IU]/mL (ref 0.35–5.50)

## 2020-09-15 NOTE — Assessment & Plan Note (Signed)
Well-controlled hypertension.  Continue amlodipine 5 mg and lisinopril 40 mg daily.  Dietary approaches to stop hypertension discussed.  Follow-up in 6 months.

## 2020-09-15 NOTE — Assessment & Plan Note (Signed)
Differential diagnosis discussed.  We will do general blood work today.

## 2020-09-15 NOTE — Progress Notes (Signed)
[name] 75 y.o.   Chief Complaint  Patient presents with   Hypertension    Follow up    HISTORY OF PRESENT ILLNESS: This is a 75 y.o. male with history of hypertension here for follow-up.  Presently on amlodipine 5 mg and lisinopril 40 mg daily. Recently has had the following problems: 1.  Penile lesion status post excision May 2022 with good results and no complications 2.  Chronic diarrhea much better with diet modification 3.  Chronic nasal congestion.  Sees ENT doctor on a regular basis.  Stable. Complaining of chronic General tiredness that starts in the early afternoon.  He does well in the morning but tired in the afternoon.  Goes to bed at 10 PM and gets up at 8 in the morning. No other complaints or significant symptoms.  Hypertension Pertinent negatives include no chest pain, headaches, palpitations or shortness of breath.    Prior to Admission medications   Medication Sig Start Date End Date Taking? Authorizing Provider  amLODipine (NORVASC) 5 MG tablet Take 1 tablet (5 mg total) by mouth in the morning and at bedtime. 12/02/19 11/26/20 Yes Donato Heinz, MD  aspirin EC 81 MG tablet Take 81 mg daily by mouth.   Yes [provider]  atorvastatin (LIPITOR) 20 MG tablet TAKE 1 TABLET ONCE DAILY. 08/09/20  Yes Donato Heinz, MD  diphenhydramine-acetaminophen (TYLENOL PM) 25-500 MG TABS tablet Take 1 tablet by mouth at bedtime as needed (for sleep).   Yes [provider]  fluticasone (FLONASE) 50 MCG/ACT nasal spray Place 2 sprays into both nostrils daily. Patient taking differently: Place 2 sprays into both nostrils at bedtime. 12/12/19  Yes Leonidus Rowand, Ines Bloomer, MD  lisinopril (ZESTRIL) 40 MG tablet TAKE ONE TABLET BY MOUTH DAILY. 08/25/20  Yes SagardiaInes Bloomer, MD  Multiple Vitamin (MULTIVITAMIN) tablet Take 1 tablet by mouth daily.   Yes [provider]  omeprazole (PRILOSEC) 20 MG capsule TAKE 1 CAPSULE 15-20  MINUTES BEFORE BREAKFAST. 08/02/20  Yes Shawnetta Lein, Ines Bloomer, MD  senna-docusate (SENOKOT-S) 8.6-50 MG tablet Take 1 tablet by mouth 2 (two) times daily. While taking pain meds to prevent constipation. 06/09/20  Yes Alexis Frock, MD  traMADol (ULTRAM) 50 MG tablet Take 1-2 tablets (50-100 mg total) by mouth every 6 (six) hours as needed for moderate pain or severe pain. Post-operatively 06/09/20 06/09/21 Yes Alexis Frock, MD    Allergies  Allergen Reactions   Other    Flomax [Tamsulosin Hcl] Other (See Comments)    "bad reaction" = jaw pain   Latex Itching and Rash    Patient Active Problem List   Diagnosis Date Noted   Dyslipidemia 09/17/2019   History of diverticulosis 09/17/2019   Chronic sinus complaints 09/17/2019   Essential hypertension 07/03/2018   Pure hypercholesterolemia 07/03/2018   Osteoarthritis of right hip 02/07/2018   Lumbar radiculopathy 01/24/2017   Enlarged prostate with urinary retention 12/25/2013   Erectile dysfunction 12/25/2013   Incomplete bladder emptying 08/27/2013   Chronic sinusitis 02/20/2012   Frontal mucocele 02/20/2012   Nasal septal deviation 02/20/2012   ADENOCARCINOMA, PROSTATE, GLEASON GRADE 6 12/21/2009   GERD 11/12/2008    Past Medical History:  Diagnosis Date   Adenocarcinoma of prostate [facility]) urologist-  dr Tresa Moore--  dx Nov 2011--  T1c, Gleason 3+3,  PSA 4.2 (post IMRT 03/2010   Low risk recurrence, Biochemical control,  last PSA 0.30 on 05-28-2014   Atherosclerosis    BPH (benign prostatic hyperplasia)  Cancer ([place])    Diverticulitis no flare up in years   diet controlled   ED (erectile dysfunction) of organic origin    GERD (gastroesophageal reflux disease)    Hiatal hernia    History of colon polyps    2002 and 2003 hyperplastic   History of gastritis    Hypertension    Penile wart    Phimosis    Wears glasses    Wears hearing aid    bilateral   Wears hearing aid in both ears     Past Surgical History:   Procedure Laterality Date   CIRCUMCISION N/A 06/09/2015   Procedure: CIRCUMCISION ADULT WITH PENILE BLOCK;  Surgeon: Alexis Frock, MD;  Location: [facility];  Service: Urology;  Laterality: N/A;   COLONOSCOPY  last one 11-15-2011   CONDYLOMA EXCISION/FULGURATION N/A 06/09/2020   Procedure: EXCISION OF PENILE WARTS;  Surgeon: Alexis Frock, MD;  Location: [facility];  Service: Urology;  Laterality: N/A;  [place] RADIATION THERAPY GUIDE  2009   for prostate   ENDOVENOUS ABLATION SAPHENOUS VEIN W/ LASER Left 02/10/2016   endovenous laser ablation left greater saphenous vein and stab phlebectomy left leg by Curt Jews MD   HERNIA REPAIR N/A    [employer] 07/11/2019   INGUINAL HERNIA REPAIR Right 12-23-2009   LIPOMA EXCISION N/A 12/01/2016   Procedure: EXCISION POSTERIOR NECK MASS;  Surgeon: Irene Limbo, MD;  Location: [place];  Service: Plastics;  Laterality: N/A;   SHOULDER ARTHROSCOPY WITH SUBACROMIAL DECOMPRESSION, ROTATOR CUFF REPAIR AND BICEP TENDON REPAIR Left 10-2014    Social History   Socioeconomic History   Marital status: Married    Spouse name: Not on file   Number of children: 0   Years of education: BS   Highest education level: Not on file  Occupational History   Occupation: SCULPTOR   Occupation: Chief Financial Officer  Tobacco Use   Smoking status: Former    Packs/day: 0.50    Years: 2.00    Pack years: 1.00    Types: Cigarettes    Quit date: 09/04/1965    Years since quitting: 55.0   Smokeless tobacco: Never   Tobacco comments:    quit around 75 yrs old.  Vaping Use   Vaping Use: Never used  Substance and Sexual Activity   Alcohol use: Yes    Alcohol/week: 20.0 standard drinks    Types: 20 Standard drinks or equivalent per week    Comment: drink 1-2 at night    Drug use: No   Sexual activity: Yes    Partners: Female  Other Topics Concern   Not on file  Social History Narrative   ** Merged History  Encounter **       Originally from [place].   Trained as an Chief Financial Officer.    Married '75- 27 years, divorced; Married '86. No children.    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 Sculptor-life size figures    by commission ([place] work [place]  Sculpting work regularly.   Never smoking.   Alcohol: drinks1- 2 beers and 1-2 wines every night;    Learning the piano. Patient get regular exercise.   Wife is a Marine scientist.   Caffeine: 3/day    ADLs: drives; independent with ADLs   Advanced Directives: YES; FULL CODE; no prolonged measures.        Social Determinants of Health   Financial  Resource Strain: Not on file  Food Insecurity: Not on file  Transportation Needs: Not on file  Physical Activity: Not on file  Stress: Not on file  Social Connections: Not on file  Intimate Partner Violence: Not on file    Family History  Problem Relation Age of Onset   Cancer Father 51       stomach versus colon cancer   Pancreatic cancer Sister 9   Cancer Brother        penile cancer   Esophageal cancer Neg Hx    Throat cancer Neg Hx      Review of Systems  Constitutional: Negative.  Negative for chills and fever.  HENT: Negative.  Negative for congestion and sore throat.   Respiratory: Negative.  Negative for cough and shortness of breath.   Cardiovascular: Negative.  Negative for chest pain and palpitations.  Gastrointestinal:  Negative for abdominal pain, diarrhea, nausea and vomiting.  Genitourinary: Negative.  Negative for dysuria and hematuria.  Skin: Negative.  Negative for rash.  Neurological: Negative.  Negative for dizziness and headaches.  All other systems reviewed and are negative.  Today's Vitals   09/15/20 0854  BP: 126/75  Pulse: 78  Temp: 97.8 F (36.6 C)  TempSrc: Oral  SpO2: 96%  Weight: 184 lb (83.5 kg)  Height: '5\' 8"'$  (1.727 m)   Body mass index is 27.98 kg/m. Wt Readings from  Last 3 Encounters:  09/15/20 184 lb (83.5 kg)  06/09/20 187 lb 8 oz (85 kg)  03/18/20 192 lb (87.1 kg)    Physical Exam Vitals reviewed.  Constitutional:      Appearance: Normal appearance.  HENT:     Head: Normocephalic.     Mouth/Throat:     Mouth: Mucous membranes are moist.     Pharynx: Oropharynx is clear.  Eyes:     Extraocular Movements: Extraocular movements intact.     Conjunctiva/sclera: Conjunctivae normal.     Pupils: Pupils are equal, round, and reactive to light.  Neck:     Vascular: No carotid bruit.  Cardiovascular:     Rate and Rhythm: Normal rate and regular rhythm.     Pulses: Normal pulses.     Heart sounds: Normal heart sounds.  Pulmonary:     Effort: Pulmonary effort is normal.     Breath sounds: Normal breath sounds.  Abdominal:     General: Bowel sounds are normal. There is no distension.     Palpations: Abdomen is soft. There is no mass.     Tenderness: There is no abdominal tenderness.  Musculoskeletal:        General: Normal range of motion.     Cervical back: Normal range of motion and neck supple. No tenderness.  Lymphadenopathy:     Cervical: No cervical adenopathy.  Skin:    General: Skin is warm and dry.     Capillary Refill: Capillary refill takes less than 2 seconds.  Neurological:     General: No focal deficit present.     Mental Status: He is alert and oriented to person, place, and time.  Psychiatric:        Mood and Affect: Mood normal.        Behavior: Behavior normal.     ASSESSMENT & PLAN: Essential hypertension Well-controlled hypertension.  Continue amlodipine 5 mg and lisinopril 40 mg daily.  Dietary approaches to stop hypertension discussed.  Follow-up in 6 months.  Dyslipidemia Diet and nutrition discussed.  Continue atorvastatin 20 mg daily. The  10-year ASCVD risk score Mikey Bussing DC Brooke Bonito., et al., 2013) is: 29.8%   Values used to calculate the score:     Age: 61 years     Sex: Male     Is Non-Hispanic African  American: No     Diabetic: No     Tobacco smoker: No     Systolic Blood Pressure: 123XX123 mmHg     Is BP treated: Yes     HDL Cholesterol: 46 mg/dL     Total Cholesterol: 153 mg/dL   Tiredness Differential diagnosis discussed.  We will do general blood work today.  Vash was seen today for hypertension.  Diagnoses and all orders for this visit:  Essential hypertension -     Comprehensive metabolic panel -     Hemoglobin A1c  Tiredness -     CBC with Differential/Platelet -     Hemoglobin A1c -     TSH  Dyslipidemia -     Hemoglobin A1c -     Lipid panel  Patient Instructions  Health Maintenance After Age 50 After age 53, you are at a higher risk for certain long-term diseases and infections as well as injuries from falls. Falls are a major cause of broken bones and head injuries in people who are older than age 64. Getting regular preventive care can help to keep you healthy and well. Preventive care includes getting regular testing and making lifestyle changes as recommended by your health care provider. Talk with your health care provider about: Which screenings and tests you should have. A screening is a test that checks for a disease when you have no symptoms. A diet and exercise plan that is right for you. What should I know about screenings and tests to prevent falls? Screening and testing are the best ways to find a health problem early. Early diagnosis and treatment give you the best chance of managing medical conditions that are common after age 11. Certain conditions and lifestyle choices may make you more likely to have a fall. Your health care provider may recommend: Regular vision checks. Poor vision and conditions such as cataracts can make you more likely to have a fall. If you wear glasses, make sure to get your prescription updated if your vision changes. Medicine review. Work with your health care provider to regularly review all of the medicines you are taking,  including over-the-counter medicines. Ask your health care provider about any side effects that may make you more likely to have a fall. Tell your health care provider if any medicines that you take make you feel dizzy or sleepy. Osteoporosis screening. Osteoporosis is a condition that causes the bones to get weaker. This can make the bones weak and cause them to break more easily. Blood pressure screening. Blood pressure changes and medicines to control blood pressure can make you feel dizzy. Strength and balance checks. Your health care provider may recommend certain tests to check your strength and balance while standing, walking, or changing positions. Foot health exam. Foot pain and numbness, as well as not wearing proper footwear, can make you more likely to have a fall. Depression screening. You may be more likely to have a fall if you have a fear of falling, feel emotionally low, or feel unable to do activities that you used to do. Alcohol use screening. Using too much alcohol can affect your balance and may make you more likely to have a fall. What actions can I take to lower my risk of  falls? General instructions Talk with your health care provider about your risks for falling. Tell your health care provider if: You fall. Be sure to tell your health care provider about all falls, even ones that seem minor. You feel dizzy, sleepy, or off-balance. Take over-the-counter and prescription medicines only as told by your health care provider. These include any supplements. Eat a healthy diet and maintain a healthy weight. A healthy diet includes low-fat dairy products, low-fat (lean) meats, and fiber from whole grains, beans, and lots of fruits and vegetables. Home safety Remove any tripping hazards, such as rugs, cords, and clutter. Install safety equipment such as grab bars in bathrooms and safety rails on stairs. Keep rooms and walkways well-lit. Activity  Follow a regular exercise program  to stay fit. This will help you maintain your balance. Ask your health care provider what types of exercise are appropriate for you. If you need a cane or walker, use it as recommended by your health care provider. Wear supportive shoes that have nonskid soles.  Lifestyle Do not drink alcohol if your health care provider tells you not to drink. If you drink alcohol, limit how much you have: 0-1 drink a day for women. 0-2 drinks a day for men. Be aware of how much alcohol is in your drink. In the [place] one drink equals one typical bottle of beer (12 oz), one-half glass of wine (5 oz), or one shot of hard liquor (1 oz). Do not use any products that contain nicotine or tobacco, such as cigarettes and e-cigarettes. If you need help quitting, ask your health care provider. Summary Having a healthy lifestyle and getting preventive care can help to protect your health and wellness after age 50. Screening and testing are the best way to find a health problem early and help you avoid having a fall. Early diagnosis and treatment give you the best chance for managing medical conditions that are more common for people who are older than age 79. Falls are a major cause of broken bones and head injuries in people who are older than age 40. Take precautions to prevent a fall at home. Work with your health care provider to learn what changes you can make to improve your health and wellness and to prevent falls. This information is not intended to replace advice given to you by your health care provider. Make sure you discuss any questions you have with your healthcare provider. Document Revised: 12/26/2019 Document Reviewed: 12/26/2019 Elsevier Patient Education  2022 [place], MD [facility]

## 2020-09-15 NOTE — Assessment & Plan Note (Signed)
Diet and nutrition discussed.  Continue atorvastatin 20 mg daily. The 10-year ASCVD risk score Mikey Bussing DC Brooke Bonito., et al., 2013) is: 29.8%   Values used to calculate the score:     Age: 75 years     Sex: Male     Is Non-Hispanic African American: No     Diabetic: No     Tobacco smoker: No     Systolic Blood Pressure: 123XX123 mmHg     Is BP treated: Yes     HDL Cholesterol: 46 mg/dL     Total Cholesterol: 153 mg/dL

## 2020-09-15 NOTE — Patient Instructions (Signed)

## 2020-10-20 ENCOUNTER — Other Ambulatory Visit: Payer: Self-pay | Admitting: Emergency Medicine

## 2020-11-15 ENCOUNTER — Other Ambulatory Visit: Payer: Self-pay | Admitting: Emergency Medicine

## 2020-12-02 ENCOUNTER — Ambulatory Visit (INDEPENDENT_AMBULATORY_CARE_PROVIDER_SITE_OTHER): Payer: Medicare Other

## 2020-12-02 ENCOUNTER — Ambulatory Visit: Payer: Medicare Other | Admitting: Cardiology

## 2020-12-02 ENCOUNTER — Encounter: Payer: Self-pay | Admitting: Cardiology

## 2020-12-02 ENCOUNTER — Other Ambulatory Visit: Payer: Self-pay

## 2020-12-02 VITALS — BP 128/78 | HR 67 | Resp 20 | Ht 70.0 in | Wt 186.6 lb

## 2020-12-02 DIAGNOSIS — I2510 Atherosclerotic heart disease of native coronary artery without angina pectoris: Secondary | ICD-10-CM

## 2020-12-02 DIAGNOSIS — E785 Hyperlipidemia, unspecified: Secondary | ICD-10-CM

## 2020-12-02 DIAGNOSIS — I10 Essential (primary) hypertension: Secondary | ICD-10-CM | POA: Diagnosis not present

## 2020-12-02 DIAGNOSIS — I447 Left bundle-branch block, unspecified: Secondary | ICD-10-CM

## 2020-12-02 DIAGNOSIS — R931 Abnormal findings on diagnostic imaging of heart and coronary circulation: Secondary | ICD-10-CM | POA: Diagnosis not present

## 2020-12-02 DIAGNOSIS — R002 Palpitations: Secondary | ICD-10-CM

## 2020-12-02 NOTE — Progress Notes (Signed)
Cardiology Office Note:    Date:  12/03/2020   ID:  Oscar Smith, DOB 07-Dec-1945, MRN [id number]  PCP:  Horald Pollen, MD  Cardiologist:  None  Electrophysiologist:  None   Referring MD: Horald Pollen, *   Chief Complaint  Patient presents with   Coronary Artery Disease     History of Present Illness:    Oscar Smith is a 75 y.o. male with a hx of prostate cancer, hypertension who presents for follow-up.  He was referred by Dr. Mitchel Honour for evaluation of left bundle branch block, initially seen on 05/09/2019.  He went to the ED on 05/06/2019 for dizziness.  Felt to be consistent with vertigo.  However work-up notable for new left bundle branch block.  High-sensitivity troponins 11->19->20.  Walks 3-4 times per week for about 25 minutes.  No chest pain, dyspnea, or syncope.  Does report intermittent episodes where it feels like heart is racing, occurs 1-2 times per month.  Can last minutes.  TTE on 05/27/19 showed LVEF 55 to 35%, grade 1 diastolic dysfunction, normal RV systolic function no significant valvular disease.  Calcium score 767 on 01/05/2020 (71st percentile)  Since last clinic visit, he reports that he is doing well.  Denies any chest pain, dyspnea, lightheadedness, syncope, or palpitations.  He reports intermittent lower extremity edema, uses compression stockings.  Reports has been having palpitations, occurs about once every 2 weeks, last for 1 to 2 minutes and resolves.  He walks 1 mile 2-3 times per week.  Reports BP has been well controlled at home.   Past Medical History:  Diagnosis Date   Adenocarcinoma of prostate [facility]) urologist-  dr Tresa Moore--  dx Nov 2011--  T1c, Gleason 3+3,  PSA 4.2 (post IMRT 03/2010   Low risk recurrence, Biochemical control,  last PSA 0.30 on 05-28-2014   Atherosclerosis    BPH (benign prostatic hyperplasia)    Cancer ([place])    Diverticulitis no flare up in years   diet controlled   ED (erectile dysfunction)  of organic origin    GERD (gastroesophageal reflux disease)    Hiatal hernia    History of colon polyps    2002 and 2003 hyperplastic   History of gastritis    Hypertension    Penile wart    Phimosis    Wears glasses    Wears hearing aid    bilateral   Wears hearing aid in both ears     Past Surgical History:  Procedure Laterality Date   CIRCUMCISION N/A 06/09/2015   Procedure: CIRCUMCISION ADULT WITH PENILE BLOCK;  Surgeon: Alexis Frock, MD;  Location: [facility];  Service: Urology;  Laterality: N/A;   COLONOSCOPY  last one 11-15-2011   CONDYLOMA EXCISION/FULGURATION N/A 06/09/2020   Procedure: EXCISION OF PENILE WARTS;  Surgeon: Alexis Frock, MD;  Location: [facility];  Service: Urology;  Laterality: N/A;  [place] RADIATION THERAPY GUIDE  2009   for prostate   ENDOVENOUS ABLATION SAPHENOUS VEIN W/ LASER Left 02/10/2016   endovenous laser ablation left greater saphenous vein and stab phlebectomy left leg by Curt Jews MD   HERNIA REPAIR N/A    [employer] 07/11/2019   INGUINAL HERNIA REPAIR Right 12-23-2009   LIPOMA EXCISION N/A 12/01/2016   Procedure: EXCISION POSTERIOR NECK MASS;  Surgeon: Irene Limbo, MD;  Location: [facility];  Service: Plastics;  Laterality: N/A;   SHOULDER ARTHROSCOPY WITH SUBACROMIAL DECOMPRESSION, ROTATOR CUFF  REPAIR AND BICEP TENDON REPAIR Left 10-2014    Current Medications: Current Meds  Medication Sig   aspirin EC 81 MG tablet Take 81 mg daily by mouth.   atorvastatin (LIPITOR) 20 MG tablet TAKE 1 TABLET ONCE DAILY.   diphenhydramine-acetaminophen (TYLENOL PM) 25-500 MG TABS tablet Take 1 tablet by mouth at bedtime as needed (for sleep).   fluticasone (FLONASE) 50 MCG/ACT nasal spray USE 2 SPRAYS IN EACH NOSTRIL ONCE A DAY.   lisinopril (ZESTRIL) 40 MG tablet TAKE ONE TABLET BY MOUTH DAILY.   Multiple Vitamin (MULTIVITAMIN) tablet Take 1 tablet by mouth daily.   omeprazole (PRILOSEC) 20  MG capsule TAKE 1 CAPSULE 15-20 MINUTES BEFORE BREAKFAST.     Allergies:   Other, Flomax [tamsulosin hcl], and Latex   Social History   Socioeconomic History   Marital status: Married    Spouse name: Not on file   Number of children: 0   Years of education: BS   Highest education level: Not on file  Occupational History   Occupation: Development worker, community   Occupation: Chief Financial Officer  Tobacco Use   Smoking status: Former    Packs/day: 0.50    Years: 2.00    Pack years: 1.00    Types: Cigarettes    Quit date: 09/04/1965    Years since quitting: 55.2   Smokeless tobacco: Never   Tobacco comments:    quit around 75 yrs old.  Vaping Use   Vaping Use: Never used  Substance and Sexual Activity   Alcohol use: Yes    Alcohol/week: 20.0 standard drinks    Types: 20 Standard drinks or equivalent per week    Comment: drink 1-2 at night    Drug use: No   Sexual activity: Yes    Partners: Female  Other Topics Concern   Not on file  Social History Narrative   ** Merged History Encounter **       Originally from [place].   Trained as an Chief Financial Officer.    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 No children.    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 Sculptor-life size figures    by commission (see work in [place]  Sculpting work regularly.   Never smoking.   Alcohol: drinks1- 2 beers and 1-2 wines every night;    Learning the piano. Patient get regular exercise.   Wife is a Marine scientist.   Caffeine: 3/day    ADLs: drives; independent with ADLs   Advanced Directives: YES; FULL CODE; no prolonged measures.        Social Determinants of Health   Financial Resource Strain: Not on file  Food Insecurity: Not on file  Transportation Needs: Not on file  Physical Activity: Not on file  Stress: Not on file  Social Connections: Not on file     Family History: The patient'sfamily history includes Cancer in his brother;  Cancer (age of onset: 25) in his father; Pancreatic cancer (age of onset: 23) in his sister. There is no history of Esophageal cancer or Throat cancer.  ROS:   Please see the history of present illness.    All other systems reviewed and are negative.  EKGs/Labs/Other Studies Reviewed:    The following studies were reviewed today:  EKG:   12/02/2020: Sinus rhythm with first-degree AV block, left bundle branch block, rate 67   TTE 05/27/19:  1. Left ventricular ejection fraction, by estimation, is 55 to 60%. The  left ventricle has normal function. The left ventricle has no regional  wall motion abnormalities. Left ventricular diastolic parameters are  consistent with Grade I diastolic  dysfunction (impaired relaxation).   2. Right ventricular systolic function is normal. The right ventricular  size is mildly enlarged.   3. Left atrial size was mildly dilated.   4. The mitral valve is normal in structure. Trivial mitral valve  regurgitation. No evidence of mitral stenosis.   5. The aortic valve is tricuspid. Aortic valve regurgitation is not  visualized. Mild aortic valve sclerosis is present, with no evidence of  aortic valve stenosis.   6. The inferior vena cava is normal in size with greater than 50%  respiratory variability, suggesting right atrial pressure of 3 mmHg.   Recent Labs: 09/15/2020: ALT 23; BUN 11; Creatinine, Ser 0.86; Hemoglobin 14.4; Platelets 195.0; Potassium 4.0; Sodium 135; TSH 1.24  Recent Lipid Panel    Component Value Date/Time   CHOL 134 09/15/2020 1005   CHOL 153 09/17/2019 0957   TRIG 112.0 09/15/2020 1005   HDL 42.50 09/15/2020 1005   HDL 46 09/17/2019 0957   CHOLHDL 3 09/15/2020 1005   VLDL 22.4 09/15/2020 1005   LDLCALC 69 09/15/2020 1005   LDLCALC 90 09/17/2019 0957   LDLDIRECT 124.7 12/22/2010 1010    Physical Exam:    VS:  BP 128/78 (BP Location: Left Arm, Patient Position: Sitting, Cuff Size: Normal)   Pulse 67   Resp 20   Ht 5\' 10"   (1.778 m)   Wt 186 lb 9.6 oz (84.6 kg)   BMI 26.77 kg/m     Wt Readings from Last 3 Encounters:  12/02/20 186 lb 9.6 oz (84.6 kg)  09/15/20 184 lb (83.5 kg)  06/09/20 187 lb 8 oz (85 kg)     GEN: Well nourished, well developed in no acute distress HEENT: Normal NECK: No JVD CARDIAC: RRR, no murmurs, rubs, gallops RESPIRATORY:  Clear to auscultation without rales, wheezing or rhonchi  ABDOMEN: Soft, non-tender, non-distended MUSCULOSKELETAL:  No edema SKIN: Warm and dry NEUROLOGIC:  Alert and oriented x 3 PSYCHIATRIC:  Normal affect   ASSESSMENT:    1. CAD in native artery   2. LBBB (left bundle branch block)   3. Elevated coronary artery calcium score   4. Palpitations   5. Essential hypertension   6. Hyperlipidemia, unspecified hyperlipidemia type     PLAN:     CAD:  Calcium score 767 on 01/05/2020 (71st percentile) -Continue aspirin 81 mg daily -Continue atorvastatin 20 mg daily.  LDL 69 on 09/15/2020 -Given significantly elevated calcium score, will check Lexiscan Myoview  Left bundle branch block: TTE on 05/27/19 showed LVEF 55 to 63%, grade 1 diastolic dysfunction, normal RV systolic function no significant valvular disease.    Palpitations: Description concerning for arrhythmia, will check Zio patch x2 weeks  Hypertension: On lisinopril 40 mg daily and amlodipine 5 mg twice daily.  Appears controlled  Hyperlipidemia: On atorvastatin 20 mg daily.  LDL 69 on 09/15/2020.  Calcium score 767 on 01/05/2020 (71st percentile)  Snoring/daytime somnolence: Normal sleep study   RTC in 6 months  Shared Decision Making/Informed Consent The risks [chest pain, shortness of breath, cardiac arrhythmias, dizziness, blood pressure fluctuations, myocardial infarction, stroke/transient ischemic attack, nausea, vomiting, allergic reaction, radiation exposure, metallic taste sensation and life-threatening complications (estimated to be 1 in 10,000)], benefits (risk stratification,  diagnosing coronary artery disease, treatment guidance) and alternatives of a nuclear stress test were discussed in detail with Mr.  Lucianne Lei Der Smith and he agrees to proceed.    Medication Adjustments/Labs and Tests Ordered: Current medicines are reviewed at length with the patient today.  Concerns regarding medicines are outlined above.  Orders Placed This Encounter  Procedures   MYOCARDIAL PERFUSION IMAGING   LONG TERM MONITOR (3-14 DAYS)   EKG 12-Lead    No orders of the defined types were placed in this encounter.   Patient Instructions  Medication Instructions:  Your physician recommends that you continue on your current medications as directed. Please refer to the Current Medication list given to you today.  *If you need a refill on your cardiac medications before your next appointment, please call your pharmacy*  Testing/Procedures: Your physician has requested that you have a Lexicographer ([employer]). For further information please visit [email or web address]. Please follow instruction sheet, as given.   How to prepare for your Myocardial Perfusion Test: Do not eat or drink 3 hours prior to your test, except you may have water. Do not consume products containing caffeine (regular or decaffeinated) 12 hours prior to your test. (ex: coffee, chocolate, sodas, tea). Do bring a list of your current medications with you.  If not listed below, you may take your medications as normal. Do wear comfortable clothes (no dresses or overalls) and walking shoes, tennis shoes preferred (No heels or open toe shoes are allowed). Do NOT wear cologne, perfume, aftershave, or lotions (deodorant is allowed). The test will take approximately 3 to 4 hours to complete If these instructions are not followed, your test will have to be rescheduled.  ZIO XT- Long Term Monitor Instructions   Your physician has requested you wear a ZIO patch monitor for _14__ days.  This is a single patch  monitor.   [employer] supplies one patch monitor per enrollment. Additional stickers are not available. Please do not apply patch if you will be having a Nuclear Stress Test, Echocardiogram, Cardiac CT, MRI, or Chest Xray during the period you would be wearing the monitor. The patch cannot be worn during these tests. You cannot remove and re-apply the ZIO XT patch monitor.  Your ZIO patch monitor will be sent Fed Ex from [employer] directly to your home address. It may take 3-5 days to receive your monitor after you have been enrolled.  Once you have received your monitor, please review the enclosed instructions. Your monitor has already been registered assigning a specific monitor serial # to you.  Billing and Patient Assistance Program Information   We have supplied [employer] with any of your insurance information on file for billing purposes. [employer] offers a sliding scale Patient Assistance Program for patients that do not have insurance, or whose insurance does not completely cover the cost of the ZIO monitor.   You must apply for the Patient Assistance Program to qualify for this discounted rate.     To apply, please call [employer] at [phone], select option 4, then select option 2, and ask to apply for Patient Assistance Program.  Theodore Demark will ask your household income, and how many people are in your household.  They will quote your out-of-pocket cost based on that information.  [employer] will also be able to set up a 66-month, interest-free payment plan if needed.  Applying the monitor   Shave hair from upper left chest.  Hold abrader disc by orange tab. Rub abrader in 40 strokes over the upper left chest as indicated in your monitor instructions.  Clean area with 4 enclosed alcohol pads.  Let dry.  Apply patch as indicated in monitor instructions. Patch will be placed under collarbone on left side of chest with arrow pointing upward.  Rub patch adhesive wings for 2 minutes. Remove white  label marked "1". Remove the white label marked "2". Rub patch adhesive wings for 2 additional minutes.  While looking in a mirror, press and release button in center of patch. A small green light will flash 3-4 times. This will be your only indicator that the monitor has been turned on. ?  Do not shower for the first 24 hours. You may shower after the first 24 hours.  Press the button if you feel a symptom. You will hear a small click. Record Date, Time and Symptom in the Patient Logbook.  When you are ready to remove the patch, follow instructions on the last 2 pages of the Patient Logbook. Stick patch monitor onto the last page of Patient Logbook.  Place Patient Logbook in the blue and white box.  Use locking tab on box and tape box closed securely.  The blue and white box has prepaid postage on it. Please place it in the mailbox as soon as possible. Your physician should have your test results approximately 7 days after the monitor has been mailed back to [facility].  Call [place] at [phone] if you have questions regarding your ZIO XT patch monitor. Call them immediately if you see an orange light blinking on your monitor.  If your monitor falls off in less than 4 days, contact our Monitor department at [phone]. ?If your monitor becomes loose or falls off after 4 days call [employer] at [phone] for suggestions on securing your monitor.?  Follow-Up: At [facility], you and your health needs are our priority.  As part of our continuing mission to provide you with exceptional heart care, we have created designated Provider Care Teams.  These Care Teams include your primary Cardiologist (physician) and Advanced Practice Providers (APPs -  Physician Assistants and Nurse Practitioners) who all work together to provide you with the care you need, when you need it.  We recommend signing up for the patient portal called "MyChart".  Sign up information is provided on  this After Visit Summary.  MyChart is used to connect with patients for Virtual Visits (Telemedicine).  Patients are able to view lab/test results, encounter notes, upcoming appointments, etc.  Non-urgent messages can be sent to your provider as well.   To learn more about what you can do with MyChart, go to [email or web address].    Your next appointment:   6 month(s)  The format for your next appointment:   In Person  Provider:   Dr. Gardiner Rhyme   Signed, Donato Heinz, MD  12/03/2020 8:43 AM    [place]

## 2020-12-02 NOTE — Progress Notes (Unsigned)
Patient enrolled for [employer] to mail a 14 day ZIO XT monitor to his address on file.

## 2020-12-02 NOTE — Patient Instructions (Signed)
Medication Instructions:  Your physician recommends that you continue on your current medications as directed. Please refer to the Current Medication list given to you today.  *If you need a refill on your cardiac medications before your next appointment, please call your pharmacy*  Testing/Procedures: Your physician has requested that you have a Lexicographer ([employer]). For further information please visit [email or web address]. Please follow instruction sheet, as given.   How to prepare for your Myocardial Perfusion Test: Do not eat or drink 3 hours prior to your test, except you may have water. Do not consume products containing caffeine (regular or decaffeinated) 12 hours prior to your test. (ex: coffee, chocolate, sodas, tea). Do bring a list of your current medications with you.  If not listed below, you may take your medications as normal. Do wear comfortable clothes (no dresses or overalls) and walking shoes, tennis shoes preferred (No heels or open toe shoes are allowed). Do NOT wear cologne, perfume, aftershave, or lotions (deodorant is allowed). The test will take approximately 3 to 4 hours to complete If these instructions are not followed, your test will have to be rescheduled.  ZIO XT- Long Term Monitor Instructions   Your physician has requested you wear a ZIO patch monitor for _14__ days.  This is a single patch monitor.   [employer] supplies one patch monitor per enrollment. Additional stickers are not available. Please do not apply patch if you will be having a Nuclear Stress Test, Echocardiogram, Cardiac CT, MRI, or Chest Xray during the period you would be wearing the monitor. The patch cannot be worn during these tests. You cannot remove and re-apply the ZIO XT patch monitor.  Your ZIO patch monitor will be sent Fed Ex from [employer] directly to your home address. It may take 3-5 days to receive your monitor after you have been enrolled.  Once you have  received your monitor, please review the enclosed instructions. Your monitor has already been registered assigning a specific monitor serial # to you.  Billing and Patient Assistance Program Information   We have supplied [employer] with any of your insurance information on file for billing purposes. [employer] offers a sliding scale Patient Assistance Program for patients that do not have insurance, or whose insurance does not completely cover the cost of the ZIO monitor.   You must apply for the Patient Assistance Program to qualify for this discounted rate.     To apply, please call [employer] at [phone], select option 4, then select option 2, and ask to apply for Patient Assistance Program.  Theodore Demark will ask your household income, and how many people are in your household.  They will quote your out-of-pocket cost based on that information.  [employer] will also be able to set up a 51-month, interest-free payment plan if needed.  Applying the monitor   Shave hair from upper left chest.  Hold abrader disc by orange tab. Rub abrader in 40 strokes over the upper left chest as indicated in your monitor instructions.  Clean area with 4 enclosed alcohol pads. Let dry.  Apply patch as indicated in monitor instructions. Patch will be placed under collarbone on left side of chest with arrow pointing upward.  Rub patch adhesive wings for 2 minutes. Remove white label marked "1". Remove the white label marked "2". Rub patch adhesive wings for 2 additional minutes.  While looking in a mirror, press and release button in center of patch. A small green light will flash 3-4 times. This  will be your only indicator that the monitor has been turned on. ?  Do not shower for the first 24 hours. You may shower after the first 24 hours.  Press the button if you feel a symptom. You will hear a small click. Record Date, Time and Symptom in the Patient Logbook.  When you are ready to remove the patch, follow instructions on the  last 2 pages of the Patient Logbook. Stick patch monitor onto the last page of Patient Logbook.  Place Patient Logbook in the blue and white box.  Use locking tab on box and tape box closed securely.  The blue and white box has prepaid postage on it. Please place it in the mailbox as soon as possible. Your physician should have your test results approximately 7 days after the monitor has been mailed back to [facility].  Call [place] at [phone] if you have questions regarding your ZIO XT patch monitor. Call them immediately if you see an orange light blinking on your monitor.  If your monitor falls off in less than 4 days, contact our Monitor department at [phone]. ?If your monitor becomes loose or falls off after 4 days call [employer] at [phone] for suggestions on securing your monitor.?  Follow-Up: At [facility], you and your health needs are our priority.  As part of our continuing mission to provide you with exceptional heart care, we have created designated Provider Care Teams.  These Care Teams include your primary Cardiologist (physician) and Advanced Practice Providers (APPs -  Physician Assistants and Nurse Practitioners) who all work together to provide you with the care you need, when you need it.  We recommend signing up for the patient portal called "MyChart".  Sign up information is provided on this After Visit Summary.  MyChart is used to connect with patients for Virtual Visits (Telemedicine).  Patients are able to view lab/test results, encounter notes, upcoming appointments, etc.  Non-urgent messages can be sent to your provider as well.   To learn more about what you can do with MyChart, go to [email or web address].    Your next appointment:   6 month(s)  The format for your next appointment:   In Person  Provider:   Dr. Gardiner Rhyme

## 2020-12-07 ENCOUNTER — Telehealth (HOSPITAL_COMMUNITY): Payer: Self-pay | Admitting: *Deleted

## 2020-12-07 DIAGNOSIS — R002 Palpitations: Secondary | ICD-10-CM | POA: Diagnosis not present

## 2020-12-07 NOTE — Telephone Encounter (Signed)
Left message on voicemail per DPR in reference to upcoming appointment scheduled on 12/13/20 at 8:15 with detailed instructions given per Myocardial Perfusion Study Information Sheet for the test. LM to arrive 15 minutes early, and that it is imperative to arrive on time for appointment to keep from having the test rescheduled. If you need to cancel or reschedule your appointment, please call the office within 24 hours of your appointment. Failure to do so may result in a cancellation of your appointment, and a $50 no show fee. Phone number given for call back for any questions.

## 2020-12-13 ENCOUNTER — Ambulatory Visit (HOSPITAL_COMMUNITY): Payer: Medicare Other | Attending: Cardiology

## 2020-12-13 ENCOUNTER — Other Ambulatory Visit: Payer: Self-pay

## 2020-12-13 DIAGNOSIS — I447 Left bundle-branch block, unspecified: Secondary | ICD-10-CM | POA: Diagnosis not present

## 2020-12-13 DIAGNOSIS — R931 Abnormal findings on diagnostic imaging of heart and coronary circulation: Secondary | ICD-10-CM | POA: Insufficient documentation

## 2020-12-13 DIAGNOSIS — I2510 Atherosclerotic heart disease of native coronary artery without angina pectoris: Secondary | ICD-10-CM | POA: Insufficient documentation

## 2020-12-13 LAB — MYOCARDIAL PERFUSION IMAGING
LV dias vol: 109 mL (ref 62–150)
LV sys vol: 50 mL
Nuc Stress EF: 54 %
Peak HR: 93 {beats}/min
Rest HR: 72 {beats}/min
Rest Nuclear Isotope Dose: 7.6 mCi
SDS: 4
SRS: 1
SSS: 4
Stress Nuclear Isotope Dose: 26.8 mCi
TID: 1.07

## 2020-12-13 MED ORDER — REGADENOSON 0.4 MG/5ML IV SOLN
0.4000 mg | Freq: Once | INTRAVENOUS | Status: AC
  Administered 2020-12-13: 0.4 mg via INTRAVENOUS

## 2020-12-13 MED ORDER — TECHNETIUM TC 99M TETROFOSMIN IV KIT
7.6000 | PACK | Freq: Once | INTRAVENOUS | Status: AC | PRN
  Administered 2020-12-13: 7.6 via INTRAVENOUS
  Filled 2020-12-13: qty 8

## 2020-12-13 MED ORDER — TECHNETIUM TC 99M TETROFOSMIN IV KIT
26.8000 | PACK | Freq: Once | INTRAVENOUS | Status: AC | PRN
  Administered 2020-12-13: 26.8 via INTRAVENOUS
  Filled 2020-12-13: qty 27

## 2020-12-14 DIAGNOSIS — H01005 Unspecified blepharitis left lower eyelid: Secondary | ICD-10-CM | POA: Diagnosis not present

## 2020-12-14 DIAGNOSIS — H10412 Chronic giant papillary conjunctivitis, left eye: Secondary | ICD-10-CM | POA: Diagnosis not present

## 2020-12-14 DIAGNOSIS — H00015 Hordeolum externum left lower eyelid: Secondary | ICD-10-CM | POA: Diagnosis not present

## 2020-12-24 ENCOUNTER — Other Ambulatory Visit: Payer: Self-pay | Admitting: *Deleted

## 2020-12-24 ENCOUNTER — Telehealth: Payer: Self-pay | Admitting: Cardiology

## 2020-12-24 DIAGNOSIS — I447 Left bundle-branch block, unspecified: Secondary | ICD-10-CM

## 2020-12-24 DIAGNOSIS — I2510 Atherosclerotic heart disease of native coronary artery without angina pectoris: Secondary | ICD-10-CM

## 2020-12-28 DIAGNOSIS — R002 Palpitations: Secondary | ICD-10-CM | POA: Diagnosis not present

## 2020-12-28 DIAGNOSIS — R350 Frequency of micturition: Secondary | ICD-10-CM | POA: Diagnosis not present

## 2021-01-13 ENCOUNTER — Other Ambulatory Visit: Payer: Self-pay

## 2021-01-13 ENCOUNTER — Ambulatory Visit (HOSPITAL_COMMUNITY): Payer: Medicare Other | Attending: Internal Medicine

## 2021-01-13 DIAGNOSIS — I2510 Atherosclerotic heart disease of native coronary artery without angina pectoris: Secondary | ICD-10-CM | POA: Diagnosis not present

## 2021-01-13 DIAGNOSIS — L821 Other seborrheic keratosis: Secondary | ICD-10-CM | POA: Diagnosis not present

## 2021-01-13 DIAGNOSIS — D1801 Hemangioma of skin and subcutaneous tissue: Secondary | ICD-10-CM | POA: Diagnosis not present

## 2021-01-13 DIAGNOSIS — D485 Neoplasm of uncertain behavior of skin: Secondary | ICD-10-CM | POA: Diagnosis not present

## 2021-01-13 DIAGNOSIS — I447 Left bundle-branch block, unspecified: Secondary | ICD-10-CM | POA: Diagnosis not present

## 2021-01-13 DIAGNOSIS — D2272 Melanocytic nevi of left lower limb, including hip: Secondary | ICD-10-CM | POA: Diagnosis not present

## 2021-01-13 DIAGNOSIS — L720 Epidermal cyst: Secondary | ICD-10-CM | POA: Diagnosis not present

## 2021-01-13 LAB — ECHOCARDIOGRAM COMPLETE: S' Lateral: 3.3 cm

## 2021-01-17 ENCOUNTER — Other Ambulatory Visit: Payer: Self-pay | Admitting: Cardiology

## 2021-01-28 ENCOUNTER — Encounter: Payer: Self-pay | Admitting: Cardiology

## 2021-01-30 ENCOUNTER — Other Ambulatory Visit: Payer: Self-pay | Admitting: Emergency Medicine

## 2021-02-14 DIAGNOSIS — L723 Sebaceous cyst: Secondary | ICD-10-CM | POA: Diagnosis not present

## 2021-02-14 DIAGNOSIS — D225 Melanocytic nevi of trunk: Secondary | ICD-10-CM | POA: Diagnosis not present

## 2021-02-16 DIAGNOSIS — H903 Sensorineural hearing loss, bilateral: Secondary | ICD-10-CM | POA: Diagnosis not present

## 2021-02-16 DIAGNOSIS — H6123 Impacted cerumen, bilateral: Secondary | ICD-10-CM | POA: Diagnosis not present

## 2021-02-20 ENCOUNTER — Other Ambulatory Visit: Payer: Self-pay | Admitting: Emergency Medicine

## 2021-03-08 DIAGNOSIS — H5203 Hypermetropia, bilateral: Secondary | ICD-10-CM | POA: Diagnosis not present

## 2021-03-08 DIAGNOSIS — H43813 Vitreous degeneration, bilateral: Secondary | ICD-10-CM | POA: Diagnosis not present

## 2021-03-08 DIAGNOSIS — H2513 Age-related nuclear cataract, bilateral: Secondary | ICD-10-CM | POA: Diagnosis not present

## 2021-03-21 ENCOUNTER — Other Ambulatory Visit: Payer: Self-pay

## 2021-03-21 ENCOUNTER — Encounter: Payer: Self-pay | Admitting: Emergency Medicine

## 2021-03-21 ENCOUNTER — Ambulatory Visit (INDEPENDENT_AMBULATORY_CARE_PROVIDER_SITE_OTHER): Payer: Medicare Other | Admitting: Emergency Medicine

## 2021-03-21 VITALS — BP 136/74 | HR 82 | Temp 98.0°F | Ht 70.0 in | Wt 189.0 lb

## 2021-03-21 DIAGNOSIS — I10 Essential (primary) hypertension: Secondary | ICD-10-CM

## 2021-03-21 DIAGNOSIS — K219 Gastro-esophageal reflux disease without esophagitis: Secondary | ICD-10-CM

## 2021-03-21 DIAGNOSIS — Z1211 Encounter for screening for malignant neoplasm of colon: Secondary | ICD-10-CM | POA: Diagnosis not present

## 2021-03-21 DIAGNOSIS — F4321 Adjustment disorder with depressed mood: Secondary | ICD-10-CM | POA: Diagnosis not present

## 2021-03-21 MED ORDER — PANTOPRAZOLE SODIUM 40 MG PO TBEC: 40.0000 mg | DELAYED_RELEASE_TABLET | Freq: Every day | ORAL | 3 refills | Status: DC

## 2021-03-21 NOTE — Assessment & Plan Note (Signed)
Active on affecting quality of life.  Stop omeprazole and start pantoprazole 40 mg daily. Diet and nutrition discussed.

## 2021-03-21 NOTE — Patient Instructions (Signed)

## 2021-03-21 NOTE — Assessment & Plan Note (Signed)
Situational intermittent symptoms.  Stable however. Stress and depression management discussed.

## 2021-03-21 NOTE — Assessment & Plan Note (Signed)
Well-controlled hypertension.  Continue lisinopril 40 mg and amlodipine 5 mg daily. BP Readings from Last 3 Encounters:  03/21/21 136/74  12/02/20 128/78  09/15/20 126/75

## 2021-03-21 NOTE — Progress Notes (Signed)
Oscar Smith 76 y.o.   Chief Complaint  Patient presents with   Follow-up   Medication Management    Omeprazole increase, still having acid refux     HISTORY OF PRESENT ILLNESS: This is a 76 y.o. male here for follow-up of chronic medical problems. History of GERD, omeprazole not working. Needs referral for colonoscopy.  Strong family history of stomach cancer. Sometimes feels down with no energy.  States he is not depressed but sometimes feels like it. No other complaints or medical concerns today.  HPI   Prior to Admission medications   Medication Sig Start Date End Date Taking? Authorizing Provider  amLODipine (NORVASC) 5 MG tablet TAKE 1 TABLET BY MOUTH TWICE DAILY. 01/18/21  Yes Donato Heinz, MD  aspirin EC 81 MG tablet Take 81 mg daily by mouth.   Yes [provider]  atorvastatin (LIPITOR) 20 MG tablet TAKE 1 TABLET ONCE DAILY. 08/09/20  Yes Donato Heinz, MD  diphenhydramine-acetaminophen (TYLENOL PM) 25-500 MG TABS tablet Take 1 tablet by mouth at bedtime as needed (for sleep).   Yes [provider]  fluticasone (FLONASE) 50 MCG/ACT nasal spray USE 2 SPRAYS IN EACH NOSTRIL ONCE A DAY. 10/20/20  Yes Shaelynn Dragos, Ines Bloomer, MD  lisinopril (ZESTRIL) 40 MG tablet TAKE ONE TABLET BY MOUTH DAILY 02/21/21  Yes Shatana Saxton, Ines Bloomer, MD  Multiple Vitamin (MULTIVITAMIN) tablet Take 1 tablet by mouth daily.   Yes [provider]  omeprazole (PRILOSEC) 20 MG capsule TAKE 1 CAPSULE 15-20 MINUTES BEFORE BREAKFAST. 01/31/21  Yes [place], Ines Bloomer, MD    Allergies  Allergen Reactions   Other    Flomax [Tamsulosin Hcl] Other (See Comments)    "bad reaction" = jaw pain   Latex Itching and Rash    Patient Active Problem List   Diagnosis Date Noted   Tiredness 09/15/2020   Dyslipidemia 09/17/2019   History of diverticulosis 09/17/2019   Chronic sinus complaints 09/17/2019   Essential hypertension 07/03/2018   Pure  hypercholesterolemia 07/03/2018   Osteoarthritis of right hip 02/07/2018   Lumbar radiculopathy 01/24/2017   Enlarged prostate with urinary retention 12/25/2013   Erectile dysfunction 12/25/2013   Incomplete bladder emptying 08/27/2013   Chronic sinusitis 02/20/2012   Frontal mucocele 02/20/2012   Nasal septal deviation 02/20/2012   ADENOCARCINOMA, PROSTATE, GLEASON GRADE 6 12/21/2009   GERD 11/12/2008    Past Medical History:  Diagnosis Date   Adenocarcinoma of prostate [facility]) urologist-  dr Tresa Moore--  dx Nov 2011--  T1c, Gleason 3+3,  PSA 4.2 (post IMRT 03/2010   Low risk recurrence, Biochemical control,  last PSA 0.30 on 05-28-2014   Atherosclerosis    BPH (benign prostatic hyperplasia)    Cancer (HCC)    Diverticulitis no flare up in years   diet controlled   ED (erectile dysfunction) of organic origin    GERD (gastroesophageal reflux disease)    Hiatal hernia    History of colon polyps    2002 and 2003 hyperplastic   History of gastritis    Hypertension    Penile wart    Phimosis    Wears glasses    Wears hearing aid    bilateral   Wears hearing aid in both ears     Past Surgical History:  Procedure Laterality Date   CIRCUMCISION N/A 06/09/2015   Procedure: CIRCUMCISION ADULT WITH PENILE BLOCK;  Surgeon: Alexis Frock, MD;  Location: [facility];  Service: Urology;  Laterality: N/A;   COLONOSCOPY  last one 11-15-2011   CONDYLOMA EXCISION/FULGURATION N/A 06/09/2020   Procedure: EXCISION OF PENILE WARTS;  Surgeon: Alexis Frock, MD;  Location: [facility];  Service: Urology;  Laterality: N/A;  [place] RADIATION THERAPY GUIDE  2009   for prostate   ENDOVENOUS ABLATION SAPHENOUS VEIN W/ LASER Left 02/10/2016   endovenous laser ablation left greater saphenous vein and stab phlebectomy left leg by Curt Jews MD   HERNIA REPAIR N/A    [employer] 07/11/2019   INGUINAL HERNIA REPAIR Right 12-23-2009   LIPOMA EXCISION N/A 12/01/2016    Procedure: EXCISION POSTERIOR NECK MASS;  Surgeon: Irene Limbo, MD;  Location: [place];  Service: Plastics;  Laterality: N/A;   SHOULDER ARTHROSCOPY WITH SUBACROMIAL DECOMPRESSION, ROTATOR CUFF REPAIR AND BICEP TENDON REPAIR Left 10-2014    Social History   Socioeconomic History   Marital status: Married    Spouse name: Not on file   Number of children: 0   Years of education: BS   Highest education level: Not on file  Occupational History   Occupation: SCULPTOR   Occupation: Chief Financial Officer  Tobacco Use   Smoking status: Former    Packs/day: 0.50    Years: 2.00    Pack years: 1.00    Types: Cigarettes    Quit date: 09/04/1965    Years since quitting: 55.5   Smokeless tobacco: Never   Tobacco comments:    quit around 76 yrs old.  Vaping Use   Vaping Use: Never used  Substance and Sexual Activity   Alcohol use: Yes    Alcohol/week: 20.0 standard drinks    Types: 20 Standard drinks or equivalent per week    Comment: drink 1-2 at night    Drug use: No   Sexual activity: Yes    Partners: Female  Other Topics Concern   Not on file  Social History Narrative   ** Merged History Encounter **       Originally from [place].   Trained as an Chief Financial Officer.    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 No children.    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 Sculptor-life size figures    by commission ([place] work [place]  Sculpting work regularly.   Never smoking.   Alcohol: drinks1- 2 beers and 1-2 wines every night;    Learning the piano. Patient get regular exercise.   Wife is a Marine scientist.   Caffeine: 3/day    ADLs: drives; independent with ADLs   Advanced Directives: YES; FULL CODE; no prolonged measures.        Social Determinants of Health   Financial Resource Strain: Not on file  Food Insecurity: Not on file  Transportation Needs: Not on file  Physical Activity: Not on  file  Stress: Not on file  Social Connections: Not on file  Intimate Partner Violence: Not on file    Family History  Problem Relation Age of Onset   Cancer Father 50       stomach versus colon cancer   Pancreatic cancer Sister 44   Cancer Brother        penile cancer   Esophageal cancer Neg Hx    Throat cancer Neg Hx      Review of Systems  Constitutional: Negative.  Negative for chills and fever.  HENT: Negative.  Negative for congestion and sore throat.   Respiratory: Negative.  Negative for cough and  shortness of breath.   Cardiovascular: Negative.  Negative for chest pain and palpitations.  Gastrointestinal:  Positive for heartburn. Negative for abdominal pain, diarrhea, nausea and vomiting.  Genitourinary: Negative.  Negative for dysuria and hematuria.  Skin: Negative.  Negative for rash.  Neurological: Negative.  Negative for dizziness and headaches.  All other systems reviewed and are negative. Today's Vitals   03/21/21 0817  BP: 136/74  Pulse: 82  Temp: 98 F (36.7 C)  TempSrc: Oral  SpO2: 97%  Weight: 189 lb (85.7 kg)  Height: 5\' 10"  (1.778 m)   Body mass index is 27.12 kg/m.   Physical Exam Vitals reviewed.  Constitutional:      Appearance: Normal appearance.  HENT:     Head: Normocephalic.  Eyes:     Extraocular Movements: Extraocular movements intact.     Conjunctiva/sclera: Conjunctivae normal.     Pupils: Pupils are equal, round, and reactive to light.  Cardiovascular:     Rate and Rhythm: Normal rate and regular rhythm.     Pulses: Normal pulses.     Heart sounds: Normal heart sounds.  Pulmonary:     Effort: Pulmonary effort is normal.     Breath sounds: Normal breath sounds.  Abdominal:     General: There is no distension.     Palpations: Abdomen is soft. There is no mass.     Tenderness: There is no abdominal tenderness.  Musculoskeletal:        General: Normal range of motion.     Cervical back: Normal range of motion and neck  supple. No tenderness.     Right lower leg: No edema.     Left lower leg: No edema.  Lymphadenopathy:     Cervical: No cervical adenopathy.  Skin:    General: Skin is warm and dry.     Capillary Refill: Capillary refill takes less than 2 seconds.  Neurological:     General: No focal deficit present.     Mental Status: He is alert and oriented to person, place, and time.  Psychiatric:        Mood and Affect: Mood normal.        Behavior: Behavior normal.     ASSESSMENT & PLAN: Problem List Items Addressed This Visit       Cardiovascular and Mediastinum   Essential hypertension    Well-controlled hypertension.  Continue lisinopril 40 mg and amlodipine 5 mg daily. BP Readings from Last 3 Encounters:  03/21/21 136/74  12/02/20 128/78  09/15/20 126/75           Digestive   GERD - Primary    Active on affecting quality of life.  Stop omeprazole and start pantoprazole 40 mg daily. Diet and nutrition discussed.      Relevant Medications   pantoprazole (PROTONIX) 40 MG tablet     Other   Situational depression    Situational intermittent symptoms.  Stable however. Stress and depression management discussed.      Other Visit Diagnoses     Colon cancer screening       Relevant Orders   Ambulatory referral to Gastroenterology      Patient Instructions  Food Choices for Gastroesophageal Reflux Disease, Adult When you have gastroesophageal reflux disease (GERD), the foods you eat and your eating habits are very important. Choosing the right foods can help ease your discomfort. Think about working with a food expert (dietitian) to help you make good choices. What are tips for following this plan? Reading  food labels Look for foods that are low in saturated fat. Foods that may help with your symptoms include: Foods that have less than 5% of daily value (DV) of fat. Foods that have 0 grams of trans fat. Cooking Do not fry your food. Cook your food by baking,  steaming, grilling, or broiling. These are all methods that do not need a lot of fat for cooking. To add flavor, try to use herbs that are low in spice and acidity. Meal planning  Choose healthy foods that are low in fat, such as: Fruits and vegetables. Whole grains. Low-fat dairy products. Lean meats, fish, and poultry. Eat small meals often instead of eating 3 large meals each day. Eat your meals slowly in a place where you are relaxed. Avoid bending over or lying down until 2-3 hours after eating. Limit high-fat foods such as fatty meats or fried foods. Limit your intake of fatty foods, such as oils, butter, and shortening. Avoid the following as told by your doctor: Foods that cause symptoms. These may be different for different people. Keep a food diary to keep track of foods that cause symptoms. Alcohol. Drinking a lot of liquid with meals. Eating meals during the 2-3 hours before bed. Lifestyle Stay at a healthy weight. Ask your doctor what weight is healthy for you. If you need to lose weight, work with your doctor to do so safely. Exercise for at least 30 minutes on 5 or more days each week, or as told by your doctor. Wear loose-fitting clothes. Do not smoke or use any products that contain nicotine or tobacco. If you need help quitting, ask your doctor. Sleep with the head of your bed higher than your feet. Use a wedge under the mattress or blocks under the bed frame to raise the head of the bed. Chew sugar-free gum after meals. What foods should eat? Eat a healthy, well-balanced diet of fruits, vegetables, whole grains, low-fat dairy products, lean meats, fish, and poultry. Each person is different. Foods that may cause symptoms in one person may not cause any symptoms in another person. Work with your doctor to find foods that are safe for you. The items listed above may not be a complete list of what you can eat and drink. Contact a food expert for more options. What foods  should I avoid? Limiting some of these foods may help in managing the symptoms of GERD. Everyone is different. Talk with a food expert or your doctor to help you find the exact foods to avoid, if any. Fruits Any fruits prepared with added fat. Any fruits that cause symptoms. For some people, this may include citrus fruits, such as oranges, grapefruit, pineapple, and lemons. Vegetables Deep-fried vegetables. [place] fries. Any vegetables prepared with added fat. Any vegetables that cause symptoms. For some people, this may include tomatoes and tomato products, chili peppers, onions and garlic, and horseradish. Grains Pastries or quick breads with added fat. Meats and other proteins High-fat meats, such as fatty beef or pork, hot dogs, ribs, ham, sausage, salami, and bacon. Fried meat or protein, including fried fish and fried chicken. Nuts and nut butters, in large amounts. Dairy Whole milk and chocolate milk. Sour cream. Cream. Ice cream. Cream cheese. Milkshakes. Fats and oils Butter. Margarine. Shortening. Ghee. Beverages Coffee and tea, with or without caffeine. Carbonated beverages. Sodas. Energy drinks. Fruit juice made with acidic fruits, such as orange or grapefruit. Tomato juice. Alcoholic drinks. Sweets and desserts Chocolate and cocoa. Donuts. Seasonings and  condiments Pepper. Peppermint and spearmint. Added salt. Any condiments, herbs, or seasonings that cause symptoms. For some people, this may include curry, hot sauce, or vinegar-based salad dressings. The items listed above may not be a complete list of what you should not eat and drink. Contact a food expert for more options. Questions to ask your doctor Diet and lifestyle changes are often the first steps that are taken to manage symptoms of GERD. If diet and lifestyle changes do not help, talk with your doctor about taking medicines. Where to find more information International Foundation for Gastrointestinal Disorders:  aboutgerd.org Summary When you have GERD, food and lifestyle choices are very important in easing your symptoms. Eat small meals often instead of 3 large meals a day. Eat your meals slowly and in a place where you are relaxed. Avoid bending over or lying down until 2-3 hours after eating. Limit high-fat foods such as fatty meats or fried foods. This information is not intended to replace advice given to you by your health care provider. Make sure you discuss any questions you have with your health care provider. Document Revised: 07/21/2019 Document Reviewed: 07/21/2019 Elsevier Patient Education  2022 [place], MD [facility]

## 2021-03-28 ENCOUNTER — Other Ambulatory Visit: Payer: Self-pay | Admitting: Cardiology

## 2021-03-30 ENCOUNTER — Encounter: Payer: Self-pay | Admitting: Emergency Medicine

## 2021-04-11 ENCOUNTER — Encounter: Payer: Self-pay | Admitting: Emergency Medicine

## 2021-04-12 ENCOUNTER — Ambulatory Visit: Payer: Medicare Other | Admitting: Gastroenterology

## 2021-04-12 ENCOUNTER — Encounter: Payer: Self-pay | Admitting: Gastroenterology

## 2021-04-12 VITALS — BP 120/64 | HR 84 | Ht 70.0 in | Wt 189.5 lb

## 2021-04-12 DIAGNOSIS — R197 Diarrhea, unspecified: Secondary | ICD-10-CM | POA: Diagnosis not present

## 2021-04-12 DIAGNOSIS — R159 Full incontinence of feces: Secondary | ICD-10-CM | POA: Diagnosis not present

## 2021-04-12 MED ORDER — NA SULFATE-K SULFATE-MG SULF 17.5-3.13-1.6 GM/177ML PO SOLN: 1.0000 | Freq: Once | ORAL | 0 refills | Status: AC

## 2021-04-12 NOTE — Telephone Encounter (Signed)
Thank you :)

## 2021-04-12 NOTE — Patient Instructions (Signed)
You have been scheduled for a colonoscopy. Please follow written instructions given to you at your visit today.  ?Please pick up your prep supplies at the pharmacy within the next 1-3 days. ?If you use inhalers (even only as needed), please bring them with you on the day of your procedure. ? ?You have been given a low-fodmap diet to follow. ? ?Due to recent changes in healthcare laws, you may see the results of your imaging and laboratory studies on MyChart before your provider has had a chance to review them.  We understand that in some cases there may be results that are confusing or concerning to you. Not all laboratory results come back in the same time frame and the provider may be waiting for multiple results in order to interpret others.  Please give [place] 48 hours in order for your provider to thoroughly review all the results before contacting the office for clarification of your results.  ? ?The [facility] providers would like to encourage you to use [facility] to communicate with providers for non-urgent requests or questions.  Due to long hold times on the telephone, sending your provider a message by [facility] may be a faster and more efficient way to get a response.  Please allow 48 business hours for a response.  Please remember that this is for non-urgent requests.  ? ?Thank you for choosing me and [facility]. ? ?Malcolm T. Dagoberto Ligas., MD., [facility] ? ?Kegel Exercises ?Kegel exercises can help strengthen your pelvic floor muscles. The pelvic floor is a group of muscles that support your rectum, small intestine, and bladder. In females, pelvic floor muscles also help support the uterus. These muscles help you control the flow of urine and stool (feces). ?Kegel exercises are painless and simple. They do not require any equipment. Your provider may suggest Kegel exercises to: ?Improve bladder and bowel control. ?Improve sexual response. ?Improve weak pelvic floor muscles after surgery to remove the  uterus (hysterectomy) or after pregnancy, in females. ?Improve weak pelvic floor muscles after prostate gland removal or surgery, in males. ?Kegel exercises involve squeezing your pelvic floor muscles. These are the same muscles you squeeze when you try to stop the flow of urine or keep from passing gas. The exercises can be done while sitting, standing, or lying down, but it is best to vary your position. ?Ask your health care provider which exercises are safe for you. Do exercises exactly as told by your health care provider and adjust them as directed. Do not begin these exercises until told by your health care provider. ?Exercises ?How to do Kegel exercises: ?Squeeze your pelvic floor muscles tight. You should feel a tight lift in your rectal area. If you are a male, you should also feel a tightness in your vaginal area. Keep your stomach, buttocks, and legs relaxed. ?Hold the muscles tight for up to 10 seconds. ?Breathe normally. ?Relax your muscles for up to 10 seconds. ?Repeat as told by your health care provider. ?Repeat this exercise daily as told by your health care provider. Continue to do this exercise for at least 4-6 weeks, or for as long as told by your health care provider. ?You may be referred to a physical therapist who can help you learn more about how to do Kegel exercises. ?Depending on your condition, your health care provider may recommend: ?Varying how long you squeeze your muscles. ?Doing several sets of exercises every day. ?Doing exercises for several weeks. ?Making Kegel exercises a part of  your regular exercise routine. ?This information is not intended to replace advice given to you by your health care provider. Make sure you discuss any questions you have with your health care provider. ?Document Revised: 05/20/2020 Document Reviewed: 05/20/2020 ?Elsevier Patient Education ? [place]. ? ?

## 2021-04-12 NOTE — Progress Notes (Signed)
? ? ?  History of Present Illness: This is a 76 year old male with frequent diarrhea and intermittent fecal incontinence.  He is accompanied by his wife. Please refer to November 25, 2019 and January 12, 2020 office notes.  He relates diarrhea about every 3 to 4 days with occasional fecal incontinence.  On other days his stools are pasty and on rare occasions he has a hard stools with straining.  He has eliminated a number of foods and beverages from his diet. Cologuard in 03/2020 was negative. Cologuard in 07/2017 was negative. Denies weight loss, abdominal pain, change in stool caliber, melena, hematochezia, nausea, vomiting, dysphagia, reflux symptoms, chest pain. ? ? ?Current Medications, Allergies, Past Medical History, Past Surgical History, Family History and Social History were reviewed in [employer] record. ? ? ?Physical Exam: ?General: Well developed, well nourished, no acute distress ?Head: Normocephalic and atraumatic ?Eyes: Sclerae anicteric, EOMI ?Ears: Normal auditory acuity ?Mouth: Not examined, mask on during Covid-19 pandemic ?Lungs: Clear throughout to auscultation ?Heart: Regular rate and rhythm; no murmurs, rubs or bruits ?Abdomen: Soft, non tender and non distended. No masses, hepatosplenomegaly or hernias noted. Normal Bowel sounds ?Rectal: Deferred to colonoscopy  ?Musculoskeletal: Symmetrical with no gross deformities  ?Pulses:  Normal pulses noted ?Extremities: No clubbing, cyanosis, edema or deformities noted ?Neurological: Alert oriented x 4, grossly nonfocal ?Psychological:  Alert and cooperative. Normal mood and affect ? ? ?Assessment and Recommendations: ? ?Diarrhea, intermittent incontinence. Closely review low FODMAP diet.  Kegel exercises at least 5 times daily long-term.  Rule out microscopic colitis, IBD, IBS. Schedule colonoscopy. The risks (including bleeding, perforation, infection, missed lesions, medication reactions and possible hospitalization or  surgery if complications occur), benefits, and alternatives to colonoscopy with possible biopsy and possible polypectomy were discussed with the patient and they consent to proceed.   ?

## 2021-05-03 ENCOUNTER — Encounter: Payer: Self-pay | Admitting: Gastroenterology

## 2021-05-08 ENCOUNTER — Other Ambulatory Visit: Payer: Self-pay | Admitting: Emergency Medicine

## 2021-05-09 ENCOUNTER — Telehealth: Payer: Self-pay

## 2021-05-09 NOTE — Telephone Encounter (Signed)
The pt has been advised and the colon has been cancelled. He is aware he will be called to set up colon at the hospital next week.  ?

## 2021-05-09 NOTE — Telephone Encounter (Signed)
-----   Message from Ladene Artist, MD sent at 05/08/2021  4:23 PM EDT ----- ?Regarding: FW: [facility] pt ?Please review Oscar Smith's assessment with patient, cancel colonoscopy in [place] and schedule colonoscopy at the hospital.  ? ? ?----- Message ----- ?From: Osvaldo Angst, CRNA ?Sent: 05/07/2021  10:35 AM EDT ?To: Ladene Artist, MD ?Subject: [place] pt                                        ? ?Dr. Fuller Plan, ? ?This pt is scheduled with you for a procedure on 4/20.  He is a documented difficult intubation and his procedure will need to be done at the hospital.   ? ? ?Procedure Name: Intubation ?Date/Time: 12/01/2016 9:30 AM ?Performed by: Willa Frater, CRNA ?Pre-anesthesia Checklist: Patient identified, Emergency Drugs available, Suction available and Patient being monitored ?Patient Re-evaluated:Patient Re-evaluated prior to induction ?Oxygen Delivery Method: Circle system utilized ?Preoxygenation: Pre-oxygenation with 100% oxygen ?Induction Type: IV induction ?Ventilation: Mask ventilation without difficulty ?Laryngoscope Size: Glidescope and 3 ?Tube type: Oral ?Tube size: 7.0 mm ?Number of attempts: 1 ?Airway Equipment and Method: Stylet,  Oral airway and Video-laryngoscopy ?Placement Confirmation: ETT inserted through vocal cords under direct vision,  positive ETCO2 and breath sounds checked- equal and bilateral ?Secured at: 22 cm ?Tube secured with: Tape ?Dental Injury: Teeth and Oropharynx as per pre-operative assessment  ?Difficulty Due To: Difficulty was anticipated, Difficult Airway- due to limited oral opening, Difficult Airway- due to anterior larynx and Difficult Airway- due to dentition ? ?Thanks, ? ?Osvaldo Angst ? ? ?

## 2021-05-12 ENCOUNTER — Encounter: Payer: Self-pay | Admitting: Gastroenterology

## 2021-05-12 ENCOUNTER — Encounter: Payer: Medicare Other | Admitting: Gastroenterology

## 2021-05-17 NOTE — Telephone Encounter (Signed)
Patient is going to call me back once he gets home to look at his schedule. He will be going out of town, and not sure if a May date will work for him. ?

## 2021-05-17 NOTE — Telephone Encounter (Signed)
Patient returned call & is aware that he on the waitlist for hospital scheduling. He will be traveling out of state quite a bit in the summer, and will not return until July.  ?

## 2021-05-22 ENCOUNTER — Other Ambulatory Visit: Payer: Self-pay | Admitting: Emergency Medicine

## 2021-05-30 ENCOUNTER — Other Ambulatory Visit: Payer: Self-pay

## 2021-05-30 ENCOUNTER — Telehealth: Payer: Self-pay

## 2021-05-30 DIAGNOSIS — R197 Diarrhea, unspecified: Secondary | ICD-10-CM

## 2021-05-30 DIAGNOSIS — R159 Full incontinence of feces: Secondary | ICD-10-CM

## 2021-05-30 DIAGNOSIS — R3912 Poor urinary stream: Secondary | ICD-10-CM | POA: Diagnosis not present

## 2021-05-30 NOTE — Telephone Encounter (Signed)
Patient has been rescheduled for colon on 08/04/21 at [facility] with Dr. Fuller Plan. Amb ref & hospital orders placed. Updated instructions have been sent to patient's mychart & home address. ?

## 2021-06-17 ENCOUNTER — Ambulatory Visit: Payer: Medicare Other

## 2021-07-08 ENCOUNTER — Ambulatory Visit (INDEPENDENT_AMBULATORY_CARE_PROVIDER_SITE_OTHER): Payer: Medicare Other

## 2021-07-08 DIAGNOSIS — Z0000 Encounter for general adult medical examination without abnormal findings: Secondary | ICD-10-CM

## 2021-07-08 NOTE — Progress Notes (Signed)
Subjective:   [name] is a 76 y.o. male who presents for an Subsequent  Medicare Annual Wellness Visit.   I connected with [name] today by telephone and verified that I am speaking with the correct person using two identifiers. Location patient: home Location provider: work Persons participating in the virtual visit: patient, provider.   I discussed the limitations, risks, security and privacy concerns of performing an evaluation and management service by telephone and the availability of in person appointments. I also discussed with the patient that there may be a patient responsible charge related to this service. The patient expressed understanding and verbally consented to this telephonic visit.    Interactive audio and video telecommunications were attempted between this provider and patient, however failed, due to patient having technical difficulties OR patient did not have access to video capability.  We continued and completed visit with audio only.    Review of Systems     Cardiac Risk Factors include: advanced age (>47mn, >>8women);male gender     Objective:    Today's Vitals   There is no height or weight on file to calculate BMI.     07/08/2021    8:24 AM 06/09/2020   12:34 PM 07/14/2019    9:03 AM 05/05/2019    2:08 PM 07/10/2018    9:09 AM 06/25/2017    8:12 AM 11/27/2016    2:45 PM  Advanced Directives  Does Patient Have a Medical Advance Directive? _0  Yes Yes  Type of AParamedicof [place] will Living will [place] will  [place] will [place] will   Does patient want to make changes to medical advance directive?  No - Patient declined   No - Patient declined No - Patient declined   Copy of HMorganin Chart? No - copy requested  Yes - validated most recent copy scanned in chart (See  row information)  No - copy requested Yes No - copy requested    Current Medications (verified) Outpatient Encounter Medications as of 07/08/2021  Medication Sig   amLODipine (NORVASC) 5 MG tablet TAKE 1 TABLET BY MOUTH TWICE DAILY.   aspirin EC 81 MG tablet Take 81 mg daily by mouth.   atorvastatin (LIPITOR) 20 MG tablet TAKE 1 TABLET ONCE DAILY.   diphenhydramine-acetaminophen (TYLENOL PM) 25-500 MG TABS tablet Take 1 tablet by mouth at bedtime as needed (for sleep).   fluticasone (FLONASE) 50 MCG/ACT nasal spray USE TWO SPRAYS IN EACH NOSTRIL ONCE DAILY   lisinopril (ZESTRIL) 40 MG tablet TAKE ONE TABLET BY MOUTH DAILY   Multiple Vitamin (MULTIVITAMIN) tablet Take 1 tablet by mouth daily.   omeprazole (PRILOSEC) 20 MG capsule Take 20 mg by mouth daily.   No facility-administered encounter medications on file as of 07/08/2021.    Allergies (verified) Other, Flomax [tamsulosin hcl], and Latex   History: Past Medical History:  Diagnosis Date   Adenocarcinoma of prostate ([facility] urologist-  dr mTresa Moore-  dx Nov 2011--  T1c, Gleason 3+3,  PSA 4.2 (post IMRT 03/2010   Low risk recurrence, Biochemical control,  last PSA 0.30 on 05-28-2014   Atherosclerosis    BPH (benign prostatic hyperplasia)    Cancer ([place]    Diverticulitis no flare up in years   diet controlled   ED (erectile dysfunction) of organic origin    GERD (gastroesophageal reflux disease)  Hiatal hernia    History of colon polyps    2002 and 2003 hyperplastic   History of gastritis    Hypertension    Penile wart    Phimosis    Wears glasses    Wears hearing aid    bilateral   Wears hearing aid in both ears    Past Surgical History:  Procedure Laterality Date   CIRCUMCISION N/A 06/09/2015   Procedure: CIRCUMCISION ADULT WITH PENILE BLOCK;  Surgeon: Alexis Frock, MD;  Location: [facility];  Service: Urology;  Laterality: N/A;   COLONOSCOPY  last one 11-15-2011   CONDYLOMA EXCISION/FULGURATION N/A  06/09/2020   Procedure: EXCISION OF PENILE WARTS;  Surgeon: Alexis Frock, MD;  [facility];  Service: Urology;  Laterality: N/A;  [place] RADIATION THERAPY GUIDE  2009   for prostate   ENDOVENOUS ABLATION SAPHENOUS VEIN W/ LASER Left 02/10/2016   endovenous laser ablation left greater saphenous vein and stab phlebectomy left leg by Curt Jews MD   HERNIA REPAIR N/A    Phreesia 07/11/2019   INGUINAL HERNIA REPAIR Right 12-23-2009   LIPOMA EXCISION N/A 12/01/2016   Procedure: EXCISION POSTERIOR NECK MASS;  Surgeon: Irene Limbo, MD;  Location: [place];  Service: Plastics;  Laterality: N/A;   SHOULDER ARTHROSCOPY WITH SUBACROMIAL DECOMPRESSION, ROTATOR CUFF REPAIR AND BICEP TENDON REPAIR Left 10-2014   Family History  Problem Relation Age of Onset   Cancer Father 4       stomach versus colon cancer   Pancreatic cancer Sister 25   Cancer Brother        penile cancer   Esophageal cancer Neg Hx    Throat cancer Neg Hx    Social History   Socioeconomic History   Marital status: Married    Spouse name: Not on file   Number of children: 0   Years of education: BS   Highest education level: Not on file  Occupational History   Occupation: Development worker, community   Occupation: Chief Financial Officer  Tobacco Use   Smoking status: Former    Packs/day: 0.50    Years: 2.00    Total pack years: 1.00    Types: Cigarettes    Quit date: 09/04/1965    Years since quitting: 55.8   Smokeless tobacco: Never   Tobacco comments:    quit around 76 yrs old.  Vaping Use   Vaping Use: Never used  Substance and Sexual Activity   Alcohol use: Yes    Alcohol/week: 20.0 standard drinks of alcohol    Types: 20 Standard drinks or equivalent per week    Comment: drink 1-2 at night    Drug use: No   Sexual activity: Yes    Partners: Female  Other Topics Concern   Not on file  Social History Narrative   ** Merged History Encounter **       [place]   Trained as an Chief Financial Officer.    Married '75- 45 years, divorced; Married '86. No children.    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 Sculptor-life size figures    by commission (see work in [place]  Sculpting work regularly.   Never smoking.   Alcohol: drinks1- 2 beers and 1-2 wines every night;    Learning the piano. Patient get regular exercise.   Wife is a Marine scientist.   Caffeine: 3/day    ADLs: drives; independent  with ADLs   Advanced Directives: YES; FULL CODE; no prolonged measures.        Social Determinants of Health   Financial Resource Strain: Low Risk  (07/08/2021)   Overall Financial Resource Strain (CARDIA)    Difficulty of Paying Living Expenses: Not hard at all  Food Insecurity: No Food Insecurity (07/08/2021)   Hunger Vital Sign    Worried About Running Out of Food in the Last Year: Never true    Ran Out of Food in the Last Year: Never true  Transportation Needs: No Transportation Needs (07/08/2021)   PRAPARE - Hydrologist (Medical): No    Lack of Transportation (Non-Medical): No  Physical Activity: Insufficiently Active (07/08/2021)   Exercise Vital Sign    Days of Exercise per Week: 3 days    Minutes of Exercise per Session: 30 min  Stress: No Stress Concern Present (07/08/2021)   [place]    Feeling of Stress : Only a little  Social Connections: Moderately Isolated (07/08/2021)   Social Connection and Isolation Panel [NHANES]    Frequency of Communication with Friends and Family: Twice a week    Frequency of Social Gatherings with Friends and Family: Twice a week    Attends Religious Services: Never    Marine scientist or Organizations: No    Attends Archivist Meetings: Never    Marital Status: Married    Tobacco Counseling Counseling given: Not Answered Tobacco  comments: quit around 76 yrs old.   Clinical Intake:  Pre-visit preparation completed: Yes  Pain : No/denies pain     Nutritional Risks: None Diabetes: No  How often do you need to have someone help you when you read instructions, pamphlets, or other written materials from your doctor or pharmacy?: 1 - Never What is the last grade level you completed in school?: college  Diabetic?no   Interpreter Needed?: No  Information entered by :: [place] of Daily Living    07/08/2021    8:27 AM  In your present state of health, do you have any difficulty performing the following activities:  Hearing? 0  Vision? 0  Difficulty concentrating or making decisions? 0  Walking or climbing stairs? 0  Dressing or bathing? 0  Doing errands, shopping? 0  Preparing Food and eating ? N  Using the Toilet? N  In the past six months, have you accidently leaked urine? N  Do you have problems with loss of bowel control? N  Managing your Medications? N  Managing your Finances? N  Housekeeping or managing your Housekeeping? N    Patient Care Team: Horald Pollen, MD as PCP - General (Internal Medicine) Wardell Honour, MD as Consulting Physician (Family Medicine) Juanita Craver, MD as Consulting Physician (Gastroenterology) Sharyne Peach, MD as Consulting Physician (Ophthalmology) Donn Pierini Jenetta Downer, DMD as Consulting Physician (Dentistry) Wardell Honour, MD (Family Medicine) Alexis Frock, MD as Consulting Physician (Urology)  Indicate any recent Medical Services you may have received from other than [facility] providers in the past year (date may be approximate).     Assessment:   This is a routine wellness examination for [place].  Hearing/Vision screen Vision Screening - Comments:: Annual eye exams wear glasses   Dietary issues and exercise activities discussed: Current Exercise Habits: Home exercise routine, Type of exercise: walking, Time (Minutes): 30,  Frequency (Times/Week): 3, Weekly Exercise (Minutes/Week): 90, Intensity: Mild, Exercise limited by: None  identified   Goals Addressed             This Visit's Progress    Increase physical activity   On track    More walking and loose weight.       Depression Screen    07/08/2021    8:25 AM 07/08/2021    8:23 AM 07/08/2021    8:22 AM 03/21/2021    8:20 AM 03/18/2020    8:59 AM 12/15/2019   10:46 AM 09/17/2019    8:37 AM  PHQ 2/9 Scores  PHQ - 2 Score 0 0 0 0 0 0 0    Fall Risk    07/08/2021    8:25 AM 03/21/2021    8:20 AM 09/15/2020    9:00 AM 03/18/2020    8:59 AM 12/15/2019   10:46 AM  Fall Risk   Falls in the past year? 0 0 0 0 0  Number falls in past yr: 0  0  0  Injury with Fall? 0  0  0  Follow up Falls evaluation completed;Education provided   Falls evaluation completed Falls evaluation completed    FALL RISK PREVENTION PERTAINING TO THE HOME:  Any stairs in or around the home? Yes  If so, are there any without handrails? No  Home free of loose throw rugs in walkways, pet beds, electrical cords, etc? Yes  Adequate lighting in your home to reduce risk of falls? Yes   ASSISTIVE DEVICES UTILIZED TO PREVENT FALLS:  Life alert? No  Use of a cane, walker or w/c? No  Grab bars in the bathroom? No  Shower chair or bench in shower? No  Elevated toilet seat or a handicapped toilet? No   Cognitive Function:  Normal cognitive status assessed by telephone conversation  by this Nurse Health Advisor. No abnormalities found.        07/14/2019    9:04 AM 07/10/2018    9:11 AM 09/26/2016    3:38 PM  6CIT Screen  What Year? 0 points 0 points 0 points  What month? 0 points 0 points 0 points  What time? 0 points 0 points 0 points  Count back from 20 0 points 0 points 0 points  Months in reverse 0 points 0 points 2 points  Repeat phrase 0 points 0 points 0 points  Total Score 0 points 0 points 2 points    Immunizations Immunization History  Administered Date(s)  Administered   Fluad Quad(high Dose 65+) 09/18/2018, 10/29/2019   Influenza Split 10/21/2010, 10/18/2011   Influenza, High Dose Seasonal PF 10/31/2017   Influenza,inj,Quad PF,6+ Mos 11/01/2012, 09/25/2013, 11/02/2015, 11/20/2016   Influenza-Unspecified 10/23/2016   MMR 02/13/2013   PFIZER(Purple Top)SARS-COV-2 Vaccination 02/27/2019, 03/24/2019   Pneumococcal Conjugate-13 06/22/2015   Pneumococcal Polysaccharide-23 08/21/2008   Tdap 01/30/2013   Zoster Recombinat (Shingrix) 04/09/2018, 07/04/2018   Zoster, Live 10/21/2010    TDAP status: Up to date  Flu Vaccine status: Up to date  Pneumococcal vaccine status: Up to date  Covid-19 vaccine status: Completed vaccines  Qualifies for Shingles Vaccine? Yes   Zostavax completed Yes   Shingrix Completed?: Yes  Screening Tests Health Maintenance  Topic Date Due   COVID-19 Vaccine (3 - Pfizer risk series) 04/21/2019   INFLUENZA VACCINE  08/23/2021   TETANUS/TDAP  01/31/2023   Pneumonia Vaccine 10+ Years old  Completed   Hepatitis C Screening  Completed   Zoster Vaccines- Shingrix  Completed   HPV VACCINES  Aged Out   COLONOSCOPY (Pts 45-15yr  Insurance coverage will need to be confirmed)  Discontinued    Health Maintenance  Health Maintenance Due  Topic Date Due   COVID-19 Vaccine (3 - Pfizer risk series) 04/21/2019    Colorectal cancer screening: No longer required.   Lung Cancer Screening: (Low Dose CT Chest recommended if Age 51-80 years, 30 pack-year currently smoking OR have quit w/in 15years.) does not qualify.   Lung Cancer Screening Referral: n/a  Additional Screening:  Hepatitis C Screening: does not qualify;  Vision Screening: Recommended annual ophthalmology exams for early detection of glaucoma and other disorders of the eye. Is the patient up to date with their annual eye exam?  Yes  Who is the provider or what is the name of the office in which the patient attends annual eye exams? Dr.Gould  If pt is  not established with a provider, would they like to be referred to a provider to establish care? No .   Dental Screening: Recommended annual dental exams for proper oral hygiene  Community Resource Referral / Chronic Care Management: CRR required this visit?  No   CCM required this visit?  No      Plan:     I have personally reviewed and noted the following in the patient's chart:   Medical and social history Use of alcohol, tobacco or illicit drugs  Current medications and supplements including opioid prescriptions. Patient is not currently taking opioid prescriptions. Functional ability and status Nutritional status Physical activity Advanced directives List of other physicians Hospitalizations, surgeries, and ER visits in previous 12 months Vitals Screenings to include cognitive, depression, and falls Referrals and appointments  In addition, I have reviewed and discussed with patient certain preventive protocols, quality metrics, and best practice recommendations. A written personalized care plan for preventive services as well as general preventive health recommendations were provided to patient.     Randel Pigg, LPN   7/41/2878   Nurse Notes: none

## 2021-07-08 NOTE — Patient Instructions (Signed)
Mr. Oscar Smith , Thank you for taking time to come for your Medicare Wellness Visit. I appreciate your ongoing commitment to your health goals. Please review the following plan we discussed and let me know if I can assist you in the future.   Screening recommendations/referrals: Colonoscopy: no longer required  Recommended yearly ophthalmology/optometry visit for glaucoma screening and checkup Recommended yearly dental visit for hygiene and checkup  Vaccinations: Influenza vaccine: completed  Pneumococcal vaccine: completed  Tdap vaccine: 01/30/2013 Shingles vaccine: completed    Advanced directives: yes   Conditions/risks identified: none   Next appointment: none   Preventive Care 67 Years and Older, Male Preventive care refers to lifestyle choices and visits with your health care provider that can promote health and wellness. What does preventive care include? A yearly physical exam. This is also called an annual well check. Dental exams once or twice a year. Routine eye exams. Ask your health care provider how often you should have your eyes checked. Personal lifestyle choices, including: Daily care of your teeth and gums. Regular physical activity. Eating a healthy diet. Avoiding tobacco and drug use. Limiting alcohol use. Practicing safe sex. Taking low doses of aspirin every day. Taking vitamin and mineral supplements as recommended by your health care provider. What happens during an annual well check? The services and screenings done by your health care provider during your annual well check will depend on your age, overall health, lifestyle risk factors, and family history of disease. Counseling  Your health care provider may ask you questions about your: Alcohol use. Tobacco use. Drug use. Emotional well-being. Home and relationship well-being. Sexual activity. Eating habits. History of falls. Memory and ability to understand (cognition). Work and work  Statistician. Screening  You may have the following tests or measurements: Height, weight, and BMI. Blood pressure. Lipid and cholesterol levels. These may be checked every 5 years, or more frequently if you are over 80 years old. Skin check. Lung cancer screening. You may have this screening every year starting at age 30 if you have a 30-pack-year history of smoking and currently smoke or have quit within the past 15 years. Fecal occult blood test (FOBT) of the stool. You may have this test every year starting at age 61. Flexible sigmoidoscopy or colonoscopy. You may have a sigmoidoscopy every 5 years or a colonoscopy every 10 years starting at age 77. Prostate cancer screening. Recommendations will vary depending on your family history and other risks. Hepatitis C blood test. Hepatitis B blood test. Sexually transmitted disease (STD) testing. Diabetes screening. This is done by checking your blood sugar (glucose) after you have not eaten for a while (fasting). You may have this done every 1-3 years. Abdominal aortic aneurysm (AAA) screening. You may need this if you are a current or former smoker. Osteoporosis. You may be screened starting at age 43 if you are at high risk. Talk with your health care provider about your test results, treatment options, and if necessary, the need for more tests. Vaccines  Your health care provider may recommend certain vaccines, such as: Influenza vaccine. This is recommended every year. Tetanus, diphtheria, and acellular pertussis (Tdap, Td) vaccine. You may need a Td booster every 10 years. Zoster vaccine. You may need this after age 40. Pneumococcal 13-valent conjugate (PCV13) vaccine. One dose is recommended after age 56. Pneumococcal polysaccharide (PPSV23) vaccine. One dose is recommended after age 12. Talk to your health care provider about which screenings and vaccines you need and  how often you need them. This information is not intended to replace  advice given to you by your health care provider. Make sure you discuss any questions you have with your health care provider. Document Released: 02/05/2015 Document Revised: 09/29/2015 Document Reviewed: 11/10/2014 Elsevier Interactive Patient Education  2017 [place] Prevention in the Home Falls can cause injuries. They can happen to people of all ages. There are many things you can do to make your home safe and to help prevent falls. What can I do on the outside of my home? Regularly fix the edges of walkways and driveways and fix any cracks. Remove anything that might make you trip as you walk through a door, such as a raised step or threshold. Trim any bushes or trees on the path to your home. Use bright outdoor lighting. Clear any walking paths of anything that might make someone trip, such as rocks or tools. Regularly check to see if handrails are loose or broken. Make sure that both sides of any steps have handrails. Any raised decks and porches should have guardrails on the edges. Have any leaves, snow, or ice cleared regularly. Use sand or salt on walking paths during winter. Clean up any spills in your garage right away. This includes oil or grease spills. What can I do in the bathroom? Use night lights. Install grab bars by the toilet and in the tub and shower. Do not use towel bars as grab bars. Use non-skid mats or decals in the tub or shower. If you need to sit down in the shower, use a plastic, non-slip stool. Keep the floor dry. Clean up any water that spills on the floor as soon as it happens. Remove soap buildup in the tub or shower regularly. Attach bath mats securely with double-sided non-slip rug tape. Do not have throw rugs and other things on the floor that can make you trip. What can I do in the bedroom? Use night lights. Make sure that you have a light by your bed that is easy to reach. Do not use any sheets or blankets that are too big for your bed.  They should not hang down onto the floor. Have a firm chair that has side arms. You can use this for support while you get dressed. Do not have throw rugs and other things on the floor that can make you trip. What can I do in the kitchen? Clean up any spills right away. Avoid walking on wet floors. Keep items that you use a lot in easy-to-reach places. If you need to reach something above you, use a strong step stool that has a grab bar. Keep electrical cords out of the way. Do not use floor polish or wax that makes floors slippery. If you must use wax, use non-skid floor wax. Do not have throw rugs and other things on the floor that can make you trip. What can I do with my stairs? Do not leave any items on the stairs. Make sure that there are handrails on both sides of the stairs and use them. Fix handrails that are broken or loose. Make sure that handrails are as long as the stairways. Check any carpeting to make sure that it is firmly attached to the stairs. Fix any carpet that is loose or worn. Avoid having throw rugs at the top or bottom of the stairs. If you do have throw rugs, attach them to the floor with carpet tape. Make sure that you have  a light switch at the top of the stairs and the bottom of the stairs. If you do not have them, ask someone to add them for you. What else can I do to help prevent falls? Wear shoes that: Do not have high heels. Have rubber bottoms. Are comfortable and fit you well. Are closed at the toe. Do not wear sandals. If you use a stepladder: Make sure that it is fully opened. Do not climb a closed stepladder. Make sure that both sides of the stepladder are locked into place. Ask someone to hold it for you, if possible. Clearly mark and make sure that you can see: Any grab bars or handrails. First and last steps. Where the edge of each step is. Use tools that help you move around (mobility aids) if they are needed. These  include: Canes. Walkers. Scooters. Crutches. Turn on the lights when you go into a dark area. Replace any light bulbs as soon as they burn out. Set up your furniture so you have a clear path. Avoid moving your furniture around. If any of your floors are uneven, fix them. If there are any pets around you, be aware of where they are. Review your medicines with your doctor. Some medicines can make you feel dizzy. This can increase your chance of falling. Ask your doctor what other things that you can do to help prevent falls. This information is not intended to replace advice given to you by your health care provider. Make sure you discuss any questions you have with your health care provider. Document Released: 11/05/2008 Document Revised: 06/17/2015 Document Reviewed: 02/13/2014 Elsevier Interactive Patient Education  2017 [employer].

## 2021-07-08 NOTE — H&P (View-Only) (Signed)
Subjective:   Oscar Smith is a 76 y.o. male who presents for an Subsequent  Medicare Annual Wellness Visit.   I connected with Oscar Smith today by telephone and verified that I am speaking with the correct person using two identifiers. Location patient: home Location provider: work Persons participating in the virtual visit: patient, provider.   I discussed the limitations, risks, security and privacy concerns of performing an evaluation and management service by telephone and the availability of in person appointments. I also discussed with the patient that there may be a patient responsible charge related to this service. The patient expressed understanding and verbally consented to this telephonic visit.    Interactive audio and video telecommunications were attempted between this provider and patient, however failed, due to patient having technical difficulties OR patient did not have access to video capability.  We continued and completed visit with audio only.    Review of Systems     Cardiac Risk Factors include: advanced age (>47mn, >>8women);male gender     Objective:    Today's Vitals   There is no height or weight on file to calculate BMI.     07/08/2021    8:24 AM 06/09/2020   12:34 PM 07/14/2019    9:03 AM 05/05/2019    2:08 PM 07/10/2018    9:09 AM 06/25/2017    8:12 AM 11/27/2016    2:45 PM  Advanced Directives  Does Patient Have a Medical Advance Directive? _0  Yes Yes  Type of AParamed[place]of [place] will Living will [place] will  [place] will [place] will   Does patient want to make changes to medical advance directive?  No - Patient declined   No - Patient declined No - Patient declined   Copy of HMorganin Chart? No - copy requested  Yes - validated most recent copy scanned in chart (See  row information)  No - copy requested Yes No - copy requested    Current Medications (verified) Outpatient Encounter Medications as of 07/08/2021  Medication Sig   amLODipine (NORVASC) 5 MG tablet TAKE 1 TABLET BY MOUTH TWICE DAILY.   aspirin EC 81 MG tablet Take 81 mg daily by mouth.   atorvastatin (LIPITOR) 20 MG tablet TAKE 1 TABLET ONCE DAILY.   diphenhydramine-acetaminophen (TYLENOL PM) 25-500 MG TABS tablet Take 1 tablet by mouth at bedtime as needed (for sleep).   fluticasone (FLONASE) 50 MCG/ACT nasal spray USE TWO SPRAYS IN EACH NOSTRIL ONCE DAILY   lisinopril (ZESTRIL) 40 MG tablet TAKE ONE TABLET BY MOUTH DAILY   Multiple Vitamin (MULTIVITAMIN) tablet Take 1 tablet by mouth daily.   omeprazole (PRILOSEC) 20 MG capsule Take 20 mg by mouth daily.   No facility-administered encounter medications on file as of 07/08/2021.    Allergies (verified) Other, Flomax [tamsulosin hcl], and Latex   History: Past Medical History:  Diagnosis Date   Adenocarcinoma of prostate ([facility] urologist-  dr mTresa Moore-  dx Nov 2011--  T1c, Gleason 3+3,  PSA 4.2 (post IMRT 03/2010   Low risk recurrence, Biochemical control,  last PSA 0.30 on 05-28-2014   Atherosclerosis    BPH (benign prostatic hyperplasia)    Cancer ([place]    Diverticulitis no flare up in years   diet controlled   ED (erectile dysfunction) of organic origin    GERD (gastroesophageal reflux disease)  Hiatal hernia    History of colon polyps    2002 and 2003 hyperplastic   History of gastritis    Hypertension    Penile wart    Phimosis    Wears glasses    Wears hearing aid    bilateral   Wears hearing aid in both ears    Past Surgical History:  Procedure Laterality Date   CIRCUMCISION N/A 06/09/2015   Procedure: CIRCUMCISION ADULT WITH PENILE BLOCK;  Surgeon: Alexis Frock, MD;  Location: [facility];  Service: Urology;  Laterality: N/A;   COLONOSCOPY  last one 11-15-2011   CONDYLOMA EXCISION/FULGURATION N/A  06/09/2020   Procedure: EXCISION OF PENILE WARTS;  Surgeon: Alexis Frock, MD;  [facility];  Service: Urology;  Laterality: N/A;  [place] RADIATION THERAPY GUIDE  2009   for prostate   ENDOVENOUS ABLATION SAPHENOUS VEIN W/ LASER Left 02/10/2016   endovenous laser ablation left greater saphenous vein and stab phlebectomy left leg by Curt Jews MD   HERNIA REPAIR N/A    Phreesia 07/11/2019   INGUINAL HERNIA REPAIR Right 12-23-2009   LIPOMA EXCISION N/A 12/01/2016   Procedure: EXCISION POSTERIOR NECK MASS;  Surgeon: Irene Limbo, MD;  Location: [place];  Service: Plastics;  Laterality: N/A;   SHOULDER ARTHROSCOPY WITH SUBACROMIAL DECOMPRESSION, ROTATOR CUFF REPAIR AND BICEP TENDON REPAIR Left 10-2014   Family History  Problem Relation Age of Onset   Cancer Father 4       stomach versus colon cancer   Pancreatic cancer Sister 25   Cancer Brother        penile cancer   Esophageal cancer Neg Hx    Throat cancer Neg Hx    Social History   Socioeconomic History   Marital status: Married    Spouse name: Not on file   Number of children: 0   Years of education: BS   Highest education level: Not on file  Occupational History   Occupation: Development worker, community   Occupation: Chief Financial Officer  Tobacco Use   Smoking status: Former    Packs/day: 0.50    Years: 2.00    Total pack years: 1.00    Types: Cigarettes    Quit date: 09/04/1965    Years since quitting: 55.8   Smokeless tobacco: Never   Tobacco comments:    quit around 76 yrs old.  Vaping Use   Vaping Use: Never used  Substance and Sexual Activity   Alcohol use: Yes    Alcohol/week: 20.0 standard drinks of alcohol    Types: 20 Standard drinks or equivalent per week    Comment: drink 1-2 at night    Drug use: No   Sexual activity: Yes    Partners: Female  Other Topics Concern   Not on file  Social History Narrative   ** Merged History Encounter **       [place]   Trained as an Chief Financial Officer.    Married '75- 45 years, divorced; Married '86. No children.    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 Sculptor-life size figures    by commission (see work in [place]  Sculpting work regularly.   Never smoking.   Alcohol: drinks1- 2 beers and 1-2 wines every night;    Learning the piano. Patient get regular exercise.   Wife is a Marine scientist.   Caffeine: 3/day    ADLs: drives; independent  with ADLs   Advanced Directives: YES; FULL CODE; no prolonged measures.        Social Determinants of Health   Financial Resource Strain: Low Risk  (07/08/2021)   Overall Financial Resource Strain (CARDIA)    Difficulty of Paying Living Expenses: Not hard at all  Food Insecurity: No Food Insecurity (07/08/2021)   Hunger Vital Sign    Worried About Running Out of Food in the Last Year: Never true    Ran Out of Food in the Last Year: Never true  Transportation Needs: No Transportation Needs (07/08/2021)   PRAPARE - Hydrologist (Medical): No    Lack of Transportation (Non-Medical): No  Physical Activity: Insufficiently Active (07/08/2021)   Exercise Vital Sign    Days of Exercise per Week: 3 days    Minutes of Exercise per Session: 30 min  Stress: No Stress Concern Present (07/08/2021)   [place]    Feeling of Stress : Only a little  Social Connections: Moderately Isolated (07/08/2021)   Social Connection and Isolation Panel [NHANES]    Frequency of Communication with Friends and Family: Twice a week    Frequency of Social Gatherings with Friends and Family: Twice a week    Attends Religious Services: Never    Marine scientist or Organizations: No    Attends Archivist Meetings: Never    Marital Status: Married    Tobacco Counseling Counseling given: Not Answered Tobacco  comments: quit around 76 yrs old.   Clinical Intake:  Pre-visit preparation completed: Yes  Pain : No/denies pain     Nutritional Risks: None Diabetes: No  How often do you need to have someone help you when you read instructions, pamphlets, or other written materials from your doctor or pharmacy?: 1 - Never What is the last grade level you completed in school?: college  Diabetic?no   Interpreter Needed?: No  Information entered by :: [place] of Daily Living    07/08/2021    8:27 AM  In your present state of health, do you have any difficulty performing the following activities:  Hearing? 0  Vision? 0  Difficulty concentrating or making decisions? 0  Walking or climbing stairs? 0  Dressing or bathing? 0  Doing errands, shopping? 0  Preparing Food and eating ? N  Using the Toilet? N  In the past six months, have you accidently leaked urine? N  Do you have problems with loss of bowel control? N  Managing your Medications? N  Managing your Finances? N  Housekeeping or managing your Housekeeping? N    Patient Care Team: Horald Pollen, MD as PCP - General (Internal Medicine) Wardell Honour, MD as Consulting Physician (Family Medicine) Juanita Craver, MD as Consulting Physician (Gastroenterology) Sharyne Peach, MD as Consulting Physician (Ophthalmology) Donn Pierini Jenetta Downer, DMD as Consulting Physician (Dentistry) Wardell Honour, MD (Family Medicine) Alexis Frock, MD as Consulting Physician (Urology)  Indicate any recent Medical Services you may have received from other than [facility] providers in the past year (date may be approximate).     Assessment:   This is a routine wellness examination for Oscar Smith.  Hearing/Vision screen Vision Screening - Comments:: Annual eye exams wear glasses   Dietary issues and exercise activities discussed: Current Exercise Habits: Home exercise routine, Type of exercise: walking, Time (Minutes): 30,  Frequency (Times/Week): 3, Weekly Exercise (Minutes/Week): 90, Intensity: Mild, Exercise limited by: None  identified   Goals Addressed             This Visit's Progress    Increase physical activity   On track    More walking and loose weight.       Depression Screen    07/08/2021    8:25 AM 07/08/2021    8:23 AM 07/08/2021    8:22 AM 03/21/2021    8:20 AM 03/18/2020    8:59 AM 12/15/2019   10:46 AM 09/17/2019    8:37 AM  PHQ 2/9 Scores  PHQ - 2 Score 0 0 0 0 0 0 0    Fall Risk    07/08/2021    8:25 AM 03/21/2021    8:20 AM 09/15/2020    9:00 AM 03/18/2020    8:59 AM 12/15/2019   10:46 AM  Fall Risk   Falls in the past year? 0 0 0 0 0  Number falls in past yr: 0  0  0  Injury with Fall? 0  0  0  Follow up Falls evaluation completed;Education provided   Falls evaluation completed Falls evaluation completed    FALL RISK PREVENTION PERTAINING TO THE HOME:  Any stairs in or around the home? Yes  If so, are there any without handrails? No  Home free of loose throw rugs in walkways, pet beds, electrical cords, etc? Yes  Adequate lighting in your home to reduce risk of falls? Yes   ASSISTIVE DEVICES UTILIZED TO PREVENT FALLS:  Life alert? No  Use of a cane, walker or w/c? No  Grab bars in the bathroom? No  Shower chair or bench in shower? No  Elevated toilet seat or a handicapped toilet? No   Cognitive Function:  Normal cognitive status assessed by telephone conversation  by this Nurse Health Advisor. No abnormalities found.        07/14/2019    9:04 AM 07/10/2018    9:11 AM 09/26/2016    3:38 PM  6CIT Screen  What Year? 0 points 0 points 0 points  What month? 0 points 0 points 0 points  What time? 0 points 0 points 0 points  Count back from 20 0 points 0 points 0 points  Months in reverse 0 points 0 points 2 points  Repeat phrase 0 points 0 points 0 points  Total Score 0 points 0 points 2 points    Immunizations Immunization History  Administered Date(s)  Administered   Fluad Quad(high Dose 65+) 09/18/2018, 10/29/2019   Influenza Split 10/21/2010, 10/18/2011   Influenza, High Dose Seasonal PF 10/31/2017   Influenza,inj,Quad PF,6+ Mos 11/01/2012, 09/25/2013, 11/02/2015, 11/20/2016   Influenza-Unspecified 10/23/2016   MMR 02/13/2013   PFIZER(Purple Top)SARS-COV-2 Vaccination 02/27/2019, 03/24/2019   Pneumococcal Conjugate-13 06/22/2015   Pneumococcal Polysaccharide-23 08/21/2008   Tdap 01/30/2013   Zoster Recombinat (Shingrix) 04/09/2018, 07/04/2018   Zoster, Live 10/21/2010    TDAP status: Up to date  Flu Vaccine status: Up to date  Pneumococcal vaccine status: Up to date  Covid-19 vaccine status: Completed vaccines  Qualifies for Shingles Vaccine? Yes   Zostavax completed Yes   Shingrix Completed?: Yes  Screening Tests Health Maintenance  Topic Date Due   COVID-19 Vaccine (3 - Pfizer risk series) 04/21/2019   INFLUENZA VACCINE  08/23/2021   TETANUS/TDAP  01/31/2023   Pneumonia Vaccine 10+ Years old  Completed   Hepatitis C Screening  Completed   Zoster Vaccines- Shingrix  Completed   HPV VACCINES  Aged Out   COLONOSCOPY (Pts 45-15yr  Insurance coverage will need to be confirmed)  Discontinued    Health Maintenance  Health Maintenance Due  Topic Date Due   COVID-19 Vaccine (3 - Pfizer risk series) 04/21/2019    Colorectal cancer screening: No longer required.   Lung Cancer Screening: (Low Dose CT Chest recommended if Age 51-80 years, 30 pack-year currently smoking OR have quit w/in 15years.) does not qualify.   Lung Cancer Screening Referral: n/a  Additional Screening:  Hepatitis C Screening: does not qualify;  Vision Screening: Recommended annual ophthalmology exams for early detection of glaucoma and other disorders of the eye. Is the patient up to date with their annual eye exam?  Yes  Who is the provider or what is the name of the office in which the patient attends annual eye exams? Dr.Gould  If pt is  not established with a provider, would they like to be referred to a provider to establish care? No .   Dental Screening: Recommended annual dental exams for proper oral hygiene  Community Resource Referral / Chronic Care Management: CRR required this visit?  No   CCM required this visit?  No      Plan:     I have personally reviewed and noted the following in the patient's chart:   Medical and social history Use of alcohol, tobacco or illicit drugs  Current medications and supplements including opioid prescriptions. Patient is not currently taking opioid prescriptions. Functional ability and status Nutritional status Physical activity Advanced directives List of other physicians Hospitalizations, surgeries, and ER visits in previous 12 months Vitals Screenings to include cognitive, depression, and falls Referrals and appointments  In addition, I have reviewed and discussed with patient certain preventive protocols, quality metrics, and best practice recommendations. A written personalized care plan for preventive services as well as general preventive health recommendations were provided to patient.     Randel Pigg, LPN   7/41/2878   Nurse Notes: none

## 2021-07-25 ENCOUNTER — Encounter (HOSPITAL_COMMUNITY): Payer: Self-pay | Admitting: Gastroenterology

## 2021-07-27 ENCOUNTER — Encounter (HOSPITAL_COMMUNITY): Payer: Self-pay | Admitting: Gastroenterology

## 2021-07-27 ENCOUNTER — Other Ambulatory Visit: Payer: Self-pay

## 2021-08-04 ENCOUNTER — Ambulatory Visit (HOSPITAL_COMMUNITY)
Admission: RE | Admit: 2021-08-04 | Discharge: 2021-08-04 | Disposition: A | Discharge status: Home / Self Care | Payer: Medicare Other | Source: Ambulatory Visit | Attending: Gastroenterology | Admitting: Gastroenterology

## 2021-08-04 ENCOUNTER — Ambulatory Visit (HOSPITAL_COMMUNITY): Payer: Medicare Other | Admitting: Certified Registered Nurse Anesthetist

## 2021-08-04 ENCOUNTER — Encounter (HOSPITAL_COMMUNITY): Payer: Self-pay | Admitting: Gastroenterology

## 2021-08-04 ENCOUNTER — Other Ambulatory Visit: Payer: Self-pay

## 2021-08-04 ENCOUNTER — Encounter (HOSPITAL_COMMUNITY)
Admission: RE | Disposition: A | Discharge status: Home / Self Care | Payer: Self-pay | Source: Ambulatory Visit | Attending: Gastroenterology

## 2021-08-04 ENCOUNTER — Ambulatory Visit (HOSPITAL_BASED_OUTPATIENT_CLINIC_OR_DEPARTMENT_OTHER): Payer: Medicare Other | Admitting: Certified Registered Nurse Anesthetist

## 2021-08-04 DIAGNOSIS — Z87891 Personal history of nicotine dependence: Secondary | ICD-10-CM | POA: Diagnosis not present

## 2021-08-04 DIAGNOSIS — R159 Full incontinence of feces: Secondary | ICD-10-CM

## 2021-08-04 DIAGNOSIS — K635 Polyp of colon: Secondary | ICD-10-CM | POA: Diagnosis not present

## 2021-08-04 DIAGNOSIS — K219 Gastro-esophageal reflux disease without esophagitis: Secondary | ICD-10-CM | POA: Diagnosis not present

## 2021-08-04 DIAGNOSIS — D124 Benign neoplasm of descending colon: Secondary | ICD-10-CM | POA: Diagnosis not present

## 2021-08-04 DIAGNOSIS — D123 Benign neoplasm of transverse colon: Secondary | ICD-10-CM | POA: Insufficient documentation

## 2021-08-04 DIAGNOSIS — R197 Diarrhea, unspecified: Secondary | ICD-10-CM

## 2021-08-04 DIAGNOSIS — K640 First degree hemorrhoids: Secondary | ICD-10-CM

## 2021-08-04 DIAGNOSIS — K5730 Diverticulosis of large intestine without perforation or abscess without bleeding: Secondary | ICD-10-CM | POA: Diagnosis not present

## 2021-08-04 DIAGNOSIS — I10 Essential (primary) hypertension: Secondary | ICD-10-CM

## 2021-08-04 HISTORY — DX: Left bundle-branch block, unspecified: I44.7

## 2021-08-04 HISTORY — PX: COLONOSCOPY WITH PROPOFOL: SHX5780

## 2021-08-04 HISTORY — PX: BIOPSY: SHX5522

## 2021-08-04 HISTORY — PX: POLYPECTOMY: SHX5525

## 2021-08-04 SURGERY — COLONOSCOPY WITH PROPOFOL
Anesthesia: Monitor Anesthesia Care

## 2021-08-04 MED ORDER — LACTATED RINGERS IV SOLN: INTRAVENOUS | Status: DC

## 2021-08-04 MED ORDER — PROPOFOL 500 MG/50ML IV EMUL
INTRAVENOUS | Status: AC
  Filled 2021-08-04: qty 50

## 2021-08-04 MED ORDER — PROPOFOL 500 MG/50ML IV EMUL
INTRAVENOUS | Status: DC | PRN
  Administered 2021-08-04: 100 ug/kg/min via INTRAVENOUS

## 2021-08-04 MED ORDER — ONDANSETRON HCL 4 MG/2ML IJ SOLN
INTRAMUSCULAR | Status: DC | PRN
  Administered 2021-08-04: 4 mg via INTRAVENOUS

## 2021-08-04 MED ORDER — SODIUM CHLORIDE 0.9 % IV SOLN: INTRAVENOUS | Status: DC

## 2021-08-04 MED ORDER — PROPOFOL 10 MG/ML IV BOLUS
INTRAVENOUS | Status: DC | PRN
  Administered 2021-08-04: 20 mg via INTRAVENOUS
  Administered 2021-08-04: 10 mg via INTRAVENOUS

## 2021-08-04 MED ORDER — PROPOFOL 1000 MG/100ML IV EMUL
INTRAVENOUS | Status: AC
  Filled 2021-08-04: qty 100

## 2021-08-04 SURGICAL SUPPLY — 22 items

## 2021-08-04 NOTE — Op Note (Signed)
[facility] Patient Name: Oscar Smith Procedure Date: 08/04/2021 MRN: [id number] Attending MD: Ladene Artist , MD Date of Birth: [birth year] CSN: [id number] Age: 76 Admit Type: Outpatient Procedure:                Colonoscopy Indications:              Clinically significant diarrhea of unexplained                            origin Providers:                Pricilla Riffle. Fuller Plan, MD, Mikey College, RN, Benetta Spar, Technician Referring MD:             Horald Pollen, MD Medicines:                Monitored Anesthesia Care Complications:            No immediate complications. Estimated blood loss:                            None. Estimated Blood Loss:     Estimated blood loss: none. Procedure:                Pre-Anesthesia Assessment:                           - Prior to the procedure, a History and Physical                            was performed, and patient medications and                            allergies were reviewed. The patient's tolerance of                            previous anesthesia was also reviewed. The risks                            and benefits of the procedure and the sedation                            options and risks were discussed with the patient.                            All questions were answered, and informed consent                            was obtained. Prior Anticoagulants: The patient has                            taken no previous anticoagulant or antiplatelet                            agents. ASA Grade Assessment:  II - A patient with                            mild systemic disease. After reviewing the risks                            and benefits, the patient was deemed in                            satisfactory condition to undergo the procedure.                           After obtaining informed consent, the colonoscope                            was passed under direct vision.  Throughout the                            procedure, the patient's blood pressure, pulse, and                            oxygen saturations were monitored continuously. The                            [id number]-HQ190L ([id number]) Olympus colonoscope was                            introduced through the anus and advanced to the the                            cecum, identified by appendiceal orifice and                            ileocecal valve. The ileocecal valve, appendiceal                            orifice, and rectum were photographed. The quality                            of the bowel preparation was good. The colonoscopy                            was performed without difficulty. The patient                            tolerated the procedure well. Scope In: 7:43:23 AM Scope Out: 8:06:07 AM Scope Withdrawal Time: 0 hours 18 minutes 23 seconds  Total Procedure Duration: 0 hours 22 minutes 44 seconds  Findings:      The perianal and digital rectal examinations were normal. Decreased       sphincter tone.      Two sessile polyps were found in the descending colon and transverse       colon. The polyps were 4 to 7 mm in size. These polyps were removed with  a cold snare. Resection and retrieval were complete.      Multiple small-mouthed diverticula were found in the left colon. There       was no evidence of diverticular bleeding.      Internal hemorrhoids were found during retroflexion. The hemorrhoids       were small and Grade I (internal hemorrhoids that do not prolapse).      The exam was otherwise without abnormality on direct and retroflexion       views. Biopsied taken witih cold forceps for histology. Impression:               - Two 4 to 7 mm polyps in the descending colon and                            in the transverse colon, removed with a cold snare.                            Resected and retrieved.                           - Mild diverticulosis in the left colon.                            - Internal hemorrhoids.                           - The examination was otherwise normal on direct                            and retroflexion views. Biopsied. Moderate Sedation:      Not Applicable - Patient had care per Anesthesia. Recommendation:           - Patient has a contact number available for                            emergencies. The signs and symptoms of potential                            delayed complications were discussed with the                            patient. Return to normal activities tomorrow.                            Written discharge instructions were provided to the                            patient.                           - Resume previous diet.                           - Continue present medications.                           - Await pathology  results. Procedure Code(s):        --- Professional ---                           [phone], Colonoscopy, flexible; with removal of                            tumor(s), polyp(s), or other lesion(s) by snare                            technique Diagnosis Code(s):        --- Professional ---                           K63.5, Polyp of colon                           K64.0, First degree hemorrhoids                           R19.7, Diarrhea, unspecified                           K57.30, Diverticulosis of large intestine without                            perforation or abscess without bleeding CPT copyright 2019 American Medical Association. All rights reserved. The codes documented in this report are preliminary and upon coder review may  be revised to meet current compliance requirements. Ladene Artist, MD 08/04/2021 8:16:14 AM This report has been signed electronically. Number of Addenda: 0

## 2021-08-04 NOTE — Anesthesia Preprocedure Evaluation (Addendum)
Anesthesia Evaluation  Patient identified by MRN, date of birth, ID band Patient awake    Reviewed: Allergy & Precautions, NPO status , Patient's Chart, lab work & pertinent test results  Airway Mallampati: III  TM Distance: >3 FB Neck ROM: Full    Dental  (+) Dental Advisory Given, Chipped,    Pulmonary neg pulmonary ROS, former smoker,    Pulmonary exam normal breath sounds clear to auscultation       Cardiovascular hypertension, Pt. on medications Normal cardiovascular exam+ dysrhythmias (LBBB)  Rhythm:Regular Rate:Normal     Neuro/Psych PSYCHIATRIC DISORDERS Depression  Neuromuscular disease    GI/Hepatic Neg liver ROS, hiatal hernia, GERD  Medicated,diarrhea, incontinence of feces   Endo/Other  negative endocrine ROS  Renal/GU negative Renal ROS   Adenocarcinoma of prostate     Musculoskeletal  (+) Arthritis ,   Abdominal   Peds  Hematology negative hematology ROS (+)   Anesthesia Other Findings   Reproductive/Obstetrics                            Anesthesia Physical Anesthesia Plan  ASA: 3  Anesthesia Plan: MAC   Post-op Pain Management: Minimal or no pain anticipated   Induction: Intravenous  PONV Risk Score and Plan: 1 and TIVA and Treatment may vary due to age or medical condition  Airway Management Planned: Natural Airway and Nasal Cannula  Additional Equipment:   Intra-op Plan:   Post-operative Plan:   Informed Consent: I have reviewed the patients History and Physical, chart, labs and discussed the procedure including the risks, benefits and alternatives for the proposed anesthesia with the patient or authorized representative who has indicated his/her understanding and acceptance.     Dental advisory given  Plan Discussed with: CRNA  Anesthesia Plan Comments:        Anesthesia Quick Evaluation

## 2021-08-04 NOTE — Interval H&P Note (Signed)
History and Physical Interval Note:  08/04/2021 7:32 AM  Oscar Smith  has presented today for surgery, with the diagnosis of diarrhea, incontinence of feces.  The various methods of treatment have been discussed with the patient and family. After consideration of risks, benefits and other options for treatment, the patient has consented to  Procedure(s): COLONOSCOPY WITH PROPOFOL (N/A) as a surgical intervention.  The patient's history has been reviewed, patient examined, no change in status, stable for surgery.  I have reviewed the patient's chart and labs.  Questions were answered to the patient's satisfaction.     Pricilla Riffle. Fuller Plan

## 2021-08-04 NOTE — Anesthesia Procedure Notes (Signed)
Procedure Name: MAC Date/Time: 08/04/2021 7:34 AM  Performed by: Maxwell Caul, CRNAPre-anesthesia Checklist: Patient identified, Emergency Drugs available, Suction available and Patient being monitored Oxygen Delivery Method: Simple face mask

## 2021-08-04 NOTE — Anesthesia Postprocedure Evaluation (Signed)
Anesthesia Post Note  Patient: Oscar Smith  Procedure(s) Performed: COLONOSCOPY WITH PROPOFOL BIOPSY POLYPECTOMY     Patient location during evaluation: Endoscopy Anesthesia Type: MAC Level of consciousness: awake and alert Pain management: pain level controlled Vital Signs Assessment: post-procedure vital signs reviewed and stable Respiratory status: spontaneous breathing, nonlabored ventilation, respiratory function stable and patient connected to nasal cannula oxygen Cardiovascular status: stable and blood pressure returned to baseline Postop Assessment: no apparent nausea or vomiting Anesthetic complications: no   No notable events documented.  Last Vitals:  Vitals:   08/04/21 0815 08/04/21 0830  BP: (!) 111/95 (!) 147/78  Pulse: 63 65  Resp: 11 14  Temp:  36.4 C  SpO2: 96% 98%    Last Pain:  Vitals:   08/04/21 0830  TempSrc:   PainSc: 0-No pain                 Santa Lighter

## 2021-08-04 NOTE — Transfer of Care (Signed)
Immediate Anesthesia Transfer of Care Note  Patient: Norberto Wishon Der Sommen  Procedure(s) Performed: COLONOSCOPY WITH PROPOFOL BIOPSY POLYPECTOMY  Patient Location: PACU and Endoscopy Unit  Anesthesia Type:MAC  Level of Consciousness: awake, alert  and oriented  Airway & Oxygen Therapy: Patient Spontanous Breathing and Patient connected to face mask oxygen  Post-op Assessment: Report given to RN and Post -op Vital signs reviewed and stable  Post vital signs: Reviewed and stable  Last Vitals:  Vitals Value Taken Time  BP    Temp    Pulse 62 08/04/21 0810  Resp 9 08/04/21 0810  SpO2 98 % 08/04/21 0810  Vitals shown include unvalidated device data.  Last Pain:  Vitals:   08/04/21 0655  TempSrc: Temporal  PainSc: 0-No pain         Complications: No notable events documented.

## 2021-08-05 LAB — SURGICAL PATHOLOGY

## 2021-08-07 ENCOUNTER — Encounter: Payer: Self-pay | Admitting: Gastroenterology

## 2021-08-08 ENCOUNTER — Other Ambulatory Visit: Payer: Self-pay | Admitting: Emergency Medicine

## 2021-08-08 ENCOUNTER — Encounter: Payer: Self-pay | Admitting: Emergency Medicine

## 2021-08-08 MED ORDER — OMEPRAZOLE 20 MG PO CPDR: 20.0000 mg | DELAYED_RELEASE_CAPSULE | Freq: Every day | ORAL | 1 refills | Status: DC

## 2021-08-08 NOTE — H&P (Signed)
History & Physical  Primary Care Physician:  Horald Pollen, MD Primary Gastroenterologist: Lucio Edward, MD  CHIEF COMPLAINT:  Frequent diarrhea and intermittent fecal incontinence  HPI: Oscar Smith is a 76 y.o. male with frequent diarrhea and intermittent fecal incontinence for colonoscopy.   Past Medical History:  Diagnosis Date   Adenocarcinoma of prostate [facility]) urologist-  dr Tresa Moore--  dx Nov 2011--  T1c, Gleason 3+3,  PSA 4.2 (post IMRT 03/2010   Low risk recurrence, Biochemical control,  last PSA 0.30 on 05-28-2014   Atherosclerosis    BPH (benign prostatic hyperplasia)    Cancer (HCC)    Diverticulitis no flare up in years   diet controlled   ED (erectile dysfunction) of organic origin    GERD (gastroesophageal reflux disease)    Hiatal hernia    History of colon polyps    2002 and 2003 hyperplastic   History of gastritis    Hypertension    Left bundle branch block    Penile wart    Phimosis    Wears glasses    Wears hearing aid    bilateral   Wears hearing aid in both ears     Past Surgical History:  Procedure Laterality Date   BIOPSY  08/04/2021   Procedure: BIOPSY;  Surgeon: Ladene Artist, MD;  Location: [facility];  Service: Gastroenterology;;   CIRCUMCISION N/A 06/09/2015   Procedure: CIRCUMCISION ADULT WITH PENILE BLOCK;  Surgeon: Alexis Frock, MD;  Location: [facility];  Service: Urology;  Laterality: N/A;   COLONOSCOPY  last one 11-15-2011   COLONOSCOPY WITH PROPOFOL N/A 08/04/2021   Procedure: COLONOSCOPY WITH PROPOFOL;  Surgeon: Ladene Artist, MD;  Location: [facility];  Service: Gastroenterology;  Laterality: N/A;   CONDYLOMA EXCISION/FULGURATION N/A 06/09/2020   Procedure: EXCISION OF PENILE WARTS;  Surgeon: Alexis Frock, MD;  Location: [facility];  Service: Urology;  Laterality: N/A;  [place] RADIATION THERAPY GUIDE  2009   for prostate   ENDOVENOUS ABLATION SAPHENOUS VEIN W/  LASER Left 02/10/2016   endovenous laser ablation left greater saphenous vein and stab phlebectomy left leg by Curt Jews MD   HERNIA REPAIR N/A    [employer]reesia 07/11/2019   INGUINAL HERNIA REPAIR Right 12-23-2009   LIPOMA EXCISION N/A 12/01/2016   Procedure: EXCISION POSTERIOR NECK MASS;  Surgeon: Irene Limbo, MD;  Location: [facility];  Service: Plastics;  Laterality: N/A;   POLYPECTOMY  08/04/2021   Procedure: POLYPECTOMY;  Surgeon: Ladene Artist, MD;  Location: [facility];  Service: Gastroenterology;;   SHOULDER ARTHROSCOPY WITH SUBACROMIAL DECOMPRESSION, ROTATOR CUFF REPAIR AND BICEP TENDON REPAIR Left 10-2014    Prior to Admission medications   Medication Sig Start Date End Date Taking? Authorizing Provider  amLODipine (NORVASC) 5 MG tablet TAKE 1 TABLET BY MOUTH TWICE DAILY. 01/18/21  Yes Donato Heinz, MD  aspirin EC 81 MG tablet Take 81 mg daily by mouth.   Yes [provider]  atorvastatin (LIPITOR) 20 MG tablet TAKE 1 TABLET ONCE DAILY. 03/28/21  Yes Donato Heinz, MD  diphenhydramine-acetaminophen (TYLENOL PM) 25-500 MG TABS tablet Take 1 tablet by mouth at bedtime as needed (for sleep).   Yes [provider]  fluticasone (FLONASE) 50 MCG/ACT nasal spray USE TWO SPRAYS IN EACH NOSTRIL ONCE DAILY Patient taking differently: Place 1-2 sprays into both nostrils at bedtime. 05/08/21  Yes Horald Pollen, MD  lisinopril (ZESTRIL) 40 MG tablet TAKE  ONE TABLET BY MOUTH DAILY 05/23/21  Yes Sagardia, Ines Bloomer, MD  Multiple Vitamin (MULTIVITAMIN) tablet Take 1 tablet by mouth daily.   Yes [provider]  omeprazole (PRILOSEC) 20 MG capsule Take 20 mg by mouth daily.   Yes [provider]    No current facility-administered medications for this encounter.   Current Outpatient Medications  Medication Sig Dispense Refill   amLODipine (NORVASC) 5 MG tablet TAKE 1 TABLET BY MOUTH TWICE DAILY. 180 tablet 3    aspirin EC 81 MG tablet Take 81 mg daily by mouth.     atorvastatin (LIPITOR) 20 MG tablet TAKE 1 TABLET ONCE DAILY. 90 tablet 3   diphenhydramine-acetaminophen (TYLENOL PM) 25-500 MG TABS tablet Take 1 tablet by mouth at bedtime as needed (for sleep).     fluticasone (FLONASE) 50 MCG/ACT nasal spray USE TWO SPRAYS IN EACH NOSTRIL ONCE DAILY (Patient taking differently: Place 1-2 sprays into both nostrils at bedtime.) 48 g 1   lisinopril (ZESTRIL) 40 MG tablet TAKE ONE TABLET BY MOUTH DAILY 90 tablet 0   Multiple Vitamin (MULTIVITAMIN) tablet Take 1 tablet by mouth daily.     omeprazole (PRILOSEC) 20 MG capsule Take 20 mg by mouth daily.      Allergies as of 05/30/2021 - Review Complete 04/12/2021  Allergen Reaction Noted   Other  07/11/2019   Flomax [tamsulosin hcl] Other (See Comments) 10/29/2015   Latex Itching and Rash 05/06/2019    Family History  Problem Relation Age of Onset   Cancer Father 25       stomach versus colon cancer   Pancreatic cancer Sister 66   Cancer Brother        penile cancer   Esophageal cancer Neg Hx    Throat cancer Neg Hx     Social History   Socioeconomic History   Marital status: Married    Spouse name: Not on file   Number of children: 0   Years of education: BS   Highest education level: Not on file  Occupational History   Occupation: Development worker, community   Occupation: Chief Financial Officer  Tobacco Use   Smoking status: Former    Packs/day: 0.50    Years: 2.00    Total pack years: 1.00    Types: Cigarettes    Quit date: 09/04/1965    Years since quitting: 55.9   Smokeless tobacco: Never   Tobacco comments:    quit around 76 yrs old.  Vaping Use   Vaping Use: Never used  Substance and Sexual Activity   Alcohol use: Yes    Alcohol/week: 20.0 standard drinks of alcohol    Types: 20 Standard drinks or equivalent per week    Comment: drink 1-2 at night    Drug use: No   Sexual activity: Yes    Partners: Female  Other Topics Concern   Not on file  Social  History Narrative   ** Merged History Encounter **       Originally from [place].   Trained as an Chief Financial Officer.    Married '75- 30 years, divorced; Married '86. No children.    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 Sculptor-life size figures    by commission (see work [place]  Sculpting work regularly.   Never smoking.   Alcohol: drinks1- 2 beers and 1-2 wines every night;    Learning the piano. Patient get regular exercise.   Wife is a Marine scientist.  Caffeine: 3/day    ADLs: drives; independent with ADLs   Advanced Directives: YES; FULL CODE; no prolonged measures.        Social Determinants of Health   Financial Resource Strain: Low Risk  (07/08/2021)   Overall Financial Resource Strain (CARDIA)    Difficulty of Paying Living Expenses: Not hard at all  Food Insecurity: No Food Insecurity (07/08/2021)   Hunger Vital Sign    Worried About Running Out of Food in the Last Year: Never true    Ran Out of Food in the Last Year: Never true  Transportation Needs: No Transportation Needs (07/08/2021)   PRAPARE - Hydrologist (Medical): No    Lack of Transportation (Non-Medical): No  Physical Activity: Insufficiently Active (07/08/2021)   Exercise Vital Sign    Days of Exercise per Week: 3 days    Minutes of Exercise per Session: 30 min  Stress: No Stress Concern Present (07/08/2021)   [place]    Feeling of Stress : Only a little  Social Connections: Moderately Isolated (07/08/2021)   Social Connection and Isolation Panel [NHANES]    Frequency of Communication with Friends and Family: Twice a week    Frequency of Social Gatherings with Friends and Family: Twice a week    Attends Religious Services: Never    Marine scientist or Organizations: No    Attends Archivist Meetings: Never    Marital  Status: Married  Human resources officer Violence: Not At Risk (07/08/2021)   Humiliation, Afraid, Rape, and Kick questionnaire    Fear of Current or Ex-Partner: No    Emotionally Abused: No    Physically Abused: No    Sexually Abused: No    Review of Systems:  All systems reviewed were negative except where noted in HPI.   Physical Exam: General:  Alert, well-developed, in NAD Head:  Normocephalic and atraumatic. Eyes:  Sclera clear, no icterus.   Conjunctiva pink. Ears:  Normal auditory acuity. Mouth:  No deformity or lesions.  Neck:  Supple; no masses . Lungs:  Clear throughout to auscultation.   No wheezes, crackles, or rhonchi. No acute distress. Heart:  Regular rate and rhythm; no murmurs. Abdomen:  Soft, nondistended, nontender. No masses, hepatomegaly. No obvious masses.  Normal bowel .    Rectal:  Deferred   Msk:  Symmetrical without gross deformities.. Pulses:  Normal pulses noted. Extremities:  Without edema. Neurologic:  Alert and  oriented x4;  grossly normal neurologically. Skin:  Intact without significant lesions or rashes. Cervical Nodes:  No significant cervical adenopathy. Psych:  Alert and cooperative. Normal mood and affect.   Impression / Plan:   Frequent diarrhea and intermittent fecal incontinence for colonoscopy.  Pricilla Riffle. Fuller Plan  08/08/2021, 1:21 PM See Shea Evans, [facility], to contact our on call provider

## 2021-08-09 ENCOUNTER — Other Ambulatory Visit: Payer: Self-pay | Admitting: Emergency Medicine

## 2021-08-31 DIAGNOSIS — H6123 Impacted cerumen, bilateral: Secondary | ICD-10-CM | POA: Diagnosis not present

## 2021-08-31 DIAGNOSIS — H903 Sensorineural hearing loss, bilateral: Secondary | ICD-10-CM | POA: Diagnosis not present

## 2021-09-20 ENCOUNTER — Encounter: Payer: Self-pay | Admitting: Emergency Medicine

## 2021-09-27 ENCOUNTER — Ambulatory Visit (INDEPENDENT_AMBULATORY_CARE_PROVIDER_SITE_OTHER): Payer: Medicare Other | Admitting: Emergency Medicine

## 2021-09-27 ENCOUNTER — Encounter: Payer: Self-pay | Admitting: Emergency Medicine

## 2021-09-27 VITALS — BP 134/72 | HR 72 | Temp 98.0°F | Ht 70.0 in | Wt 187.1 lb

## 2021-09-27 DIAGNOSIS — J329 Chronic sinusitis, unspecified: Secondary | ICD-10-CM

## 2021-09-27 DIAGNOSIS — R338 Other retention of urine: Secondary | ICD-10-CM | POA: Diagnosis not present

## 2021-09-27 DIAGNOSIS — E785 Hyperlipidemia, unspecified: Secondary | ICD-10-CM

## 2021-09-27 DIAGNOSIS — K219 Gastro-esophageal reflux disease without esophagitis: Secondary | ICD-10-CM

## 2021-09-27 DIAGNOSIS — N401 Enlarged prostate with lower urinary tract symptoms: Secondary | ICD-10-CM

## 2021-09-27 DIAGNOSIS — I10 Essential (primary) hypertension: Secondary | ICD-10-CM

## 2021-09-27 DIAGNOSIS — Z8719 Personal history of other diseases of the digestive system: Secondary | ICD-10-CM | POA: Diagnosis not present

## 2021-09-27 LAB — LIPID PANEL
Cholesterol: 133 mg/dL (ref 0–200)
HDL: 39.4 mg/dL (ref >39.00)
NonHDL: 93.19
Total CHOL/HDL Ratio: 3
Triglycerides: 216 mg/dL — ABNORMAL HIGH (ref 0.0–149.0)
VLDL: 43.2 mg/dL — ABNORMAL HIGH (ref 0.0–40.0)

## 2021-09-27 LAB — CBC WITH DIFFERENTIAL/PLATELET
Basophils Absolute: 0 10*3/uL (ref 0.0–0.1)
Basophils Relative: 0.7 % (ref 0.0–3.0)
Eosinophils Absolute: 0.1 10*3/uL (ref 0.0–0.7)
Eosinophils Relative: 1.9 % (ref 0.0–5.0)
HCT: 39.9 % (ref 39.0–52.0)
Hemoglobin: 14 g/dL (ref 13.0–17.0)
Lymphocytes Relative: 29.5 % (ref 12.0–46.0)
Lymphs Abs: 1.8 10*3/uL (ref 0.7–4.0)
MCHC: 35.1 g/dL (ref 30.0–36.0)
MCV: 91.9 fl (ref 78.0–100.0)
Monocytes Absolute: 0.5 10*3/uL (ref 0.1–1.0)
Monocytes Relative: 8.8 % (ref 3.0–12.0)
Neutro Abs: 3.6 10*3/uL (ref 1.4–7.7)
Neutrophils Relative %: 59.1 % (ref 43.0–77.0)
Platelets: 186 10*3/uL (ref 150.0–400.0)
RBC: 4.34 Mil/uL (ref 4.22–5.81)
RDW: 12.9 % (ref 11.5–15.5)
WBC: 6.1 10*3/uL (ref 4.0–10.5)

## 2021-09-27 LAB — COMPREHENSIVE METABOLIC PANEL
ALT: 27 U/L (ref 0–53)
AST: 21 U/L (ref 0–37)
Albumin: 4 g/dL (ref 3.5–5.2)
Alkaline Phosphatase: 80 U/L (ref 39–117)
BUN: 10 mg/dL (ref 6–23)
CO2: 26 mEq/L (ref 19–32)
Calcium: 9.3 mg/dL (ref 8.4–10.5)
Chloride: 101 mEq/L (ref 96–112)
Creatinine, Ser: 0.81 mg/dL (ref 0.40–1.50)
GFR: 84.61 mL/min (ref >60.00)
Glucose, Bld: 99 mg/dL (ref 70–99)
Potassium: 3.9 mEq/L (ref 3.5–5.1)
Sodium: 134 mEq/L — ABNORMAL LOW (ref 135–145)
Total Bilirubin: 0.7 mg/dL (ref 0.2–1.2)
Total Protein: 6.8 g/dL (ref 6.0–8.3)

## 2021-09-27 LAB — LDL CHOLESTEROL, DIRECT: Direct LDL: 68 mg/dL

## 2021-09-27 LAB — HEMOGLOBIN A1C: Hgb A1c MFr Bld: 5.3 % (ref 4.6–6.5)

## 2021-09-27 NOTE — Assessment & Plan Note (Signed)
Well-controlled hypertension. BP Readings from Last 3 Encounters:  09/27/21 134/72  08/04/21 (!) 147/78  04/12/21 120/64  Continue lisinopril 40 mg and amlodipine 5 mg daily. Cardiovascular risks associated with hypertension discussed.

## 2021-09-27 NOTE — Assessment & Plan Note (Signed)
Stable and well-controlled. Continue omeprazole 20 mg daily.

## 2021-09-27 NOTE — Patient Instructions (Signed)
Health Maintenance After Age 78 After age 78, you are at a higher risk for certain long-term diseases and infections as well as injuries from falls. Falls are a major cause of broken bones and head injuries in people who are older than age 78. Getting regular preventive care can help to keep you healthy and well. Preventive care includes getting regular testing and making lifestyle changes as recommended by your health care provider. Talk with your health care provider about: Which screenings and tests you should have. A screening is a test that checks for a disease when you have no symptoms. A diet and exercise plan that is right for you. What should I know about screenings and tests to prevent falls? Screening and testing are the best ways to find a health problem early. Early diagnosis and treatment give you the best chance of managing medical conditions that are common after age 78. Certain conditions and lifestyle choices may make you more likely to have a fall. Your health care provider may recommend: Regular vision checks. Poor vision and conditions such as cataracts can make you more likely to have a fall. If you wear glasses, make sure to get your prescription updated if your vision changes. Medicine review. Work with your health care provider to regularly review all of the medicines you are taking, including over-the-counter medicines. Ask your health care provider about any side effects that may make you more likely to have a fall. Tell your health care provider if any medicines that you take make you feel dizzy or sleepy. Strength and balance checks. Your health care provider may recommend certain tests to check your strength and balance while standing, walking, or changing positions. Foot health exam. Foot pain and numbness, as well as not wearing proper footwear, can make you more likely to have a fall. Screenings, including: Osteoporosis screening. Osteoporosis is a condition that causes  the bones to get weaker and break more easily. Blood pressure screening. Blood pressure changes and medicines to control blood pressure can make you feel dizzy. Depression screening. You may be more likely to have a fall if you have a fear of falling, feel depressed, or feel unable to do activities that you used to do. Alcohol use screening. Using too much alcohol can affect your balance and may make you more likely to have a fall. Follow these instructions at home: Lifestyle Do not drink alcohol if: Your health care provider tells you not to drink. If you drink alcohol: Limit how much you have to: 0-1 drink a day for women. 0-2 drinks a day for men. Know how much alcohol is in your drink. In the [place] one drink equals one 12 oz bottle of beer (355 mL), one 5 oz glass of wine (148 mL), or one 1 oz glass of hard liquor (44 mL). Do not use any products that contain nicotine or tobacco. These products include cigarettes, chewing tobacco, and vaping devices, such as e-cigarettes. If you need help quitting, ask your health care provider. Activity  Follow a regular exercise program to stay fit. This will help you maintain your balance. Ask your health care provider what types of exercise are appropriate for you. If you need a cane or walker, use it as recommended by your health care provider. Wear supportive shoes that have nonskid soles. Safety  Remove any tripping hazards, such as rugs, cords, and clutter. Install safety equipment such as grab bars in bathrooms and safety rails on stairs. Keep rooms and walkways   well-lit. General instructions Talk with your health care provider about your risks for falling. Tell your health care provider if: You fall. Be sure to tell your health care provider about all falls, even ones that seem minor. You feel dizzy, tiredness (fatigue), or off-balance. Take over-the-counter and prescription medicines only as told by your health care provider. These include  supplements. Eat a healthy diet and maintain a healthy weight. A healthy diet includes low-fat dairy products, low-fat (lean) meats, and fiber from whole grains, beans, and lots of fruits and vegetables. Stay current with your vaccines. Schedule regular health, dental, and eye exams. Summary Having a healthy lifestyle and getting preventive care can help to protect your health and wellness after age 78. Screening and testing are the best way to find a health problem early and help you avoid having a fall. Early diagnosis and treatment give you the best chance for managing medical conditions that are more common for people who are older than age 78. Falls are a major cause of broken bones and head injuries in people who are older than age 78. Take precautions to prevent a fall at home. Work with your health care provider to learn what changes you can make to improve your health and wellness and to prevent falls. This information is not intended to replace advice given to you by your health care provider. Make sure you discuss any questions you have with your health care provider. Document Revised: 05/31/2020 Document Reviewed: 05/31/2020 Elsevier Patient Education  2023 Elsevier Inc.  

## 2021-09-27 NOTE — Assessment & Plan Note (Signed)
Stable.  Diet and nutrition discussed. Continue atorvastatin 20 mg daily. The 10-year ASCVD risk score (Arnett DK, et al., 2019) is: 34.3%   Values used to calculate the score:     Age: 76 years     Sex: Male     Is Non-Hispanic African American: No     Diabetic: No     Tobacco smoker: No     Systolic Blood Pressure: 950 mmHg     Is BP treated: Yes     HDL Cholesterol: 42.5 mg/dL     Total Cholesterol: 134 mg/dL

## 2021-09-27 NOTE — Assessment & Plan Note (Signed)
Recent colonoscopy showed benign colonic polyps. Report reviewed with patient. Surgical report reviewed.

## 2021-09-27 NOTE — Progress Notes (Signed)
Oscar Smith 76 y.o.   Chief Complaint  Patient presents with   Follow-up    F/u after colonoscopy     HISTORY OF PRESENT ILLNESS: This is a 76 y.o. male here today for follow-up of chronic medical problems. Had colonoscopy done last month which showed benign colonic polyps, diverticulosis, and internal hemorrhoids. Overall doing well. Has no complaints or medical concerns today.  HPI   Prior to Admission medications   Medication Sig Start Date End Date Taking? Authorizing Provider  amLODipine (NORVASC) 5 MG tablet TAKE 1 TABLET BY MOUTH TWICE DAILY. 01/18/21  Yes Donato Heinz, MD  aspirin EC 81 MG tablet Take 81 mg daily by mouth.   Yes [provider]  atorvastatin (LIPITOR) 20 MG tablet TAKE 1 TABLET ONCE DAILY. 03/28/21  Yes Donato Heinz, MD  diphenhydramine-acetaminophen (TYLENOL PM) 25-500 MG TABS tablet Take 1 tablet by mouth at bedtime as needed (for sleep).   Yes [provider]  fluticasone (FLONASE) 50 MCG/ACT nasal spray USE TWO SPRAYS IN EACH NOSTRIL ONCE DAILY Patient taking differently: Place 1-2 sprays into both nostrils at bedtime. 05/08/21  Yes Horald Pollen, MD  lisinopril (ZESTRIL) 40 MG tablet TAKE ONE TABLET BY MOUTH DAILY 08/09/21  Yes Dinari Stgermaine, Ines Bloomer, MD  Multiple Vitamin (MULTIVITAMIN) tablet Take 1 tablet by mouth daily.   Yes [provider]  omeprazole (PRILOSEC) 20 MG capsule Take 1 capsule (20 mg total) by mouth daily. 08/08/21  Yes Horald Pollen, MD    Allergies  Allergen Reactions   Flomax [Tamsulosin Hcl] Other (See Comments)    "bad reaction" = jaw pain   Latex Itching and Rash    Patient Active Problem List   Diagnosis Date Noted   Benign neoplasm of descending colon    Benign neoplasm of transverse colon    Situational depression 03/21/2021   Dyslipidemia 09/17/2019   History of diverticulosis 09/17/2019   Chronic sinus complaints 09/17/2019   Essential  hypertension 07/03/2018   Pure hypercholesterolemia 07/03/2018   Osteoarthritis of right hip 02/07/2018   Lumbar radiculopathy 01/24/2017   Enlarged prostate with urinary retention 12/25/2013   Erectile dysfunction 12/25/2013   Incomplete bladder emptying 08/27/2013   Chronic sinusitis 02/20/2012   Frontal mucocele 02/20/2012   Nasal septal deviation 02/20/2012   ADENOCARCINOMA, PROSTATE, GLEASON GRADE 6 12/21/2009   GERD 11/12/2008    Past Medical History:  Diagnosis Date   Adenocarcinoma of prostate [facility]) urologist-  dr Tresa Moore--  dx Nov 2011--  T1c, Gleason 3+3,  PSA 4.2 (post IMRT 03/2010   Low risk recurrence, Biochemical control,  last PSA 0.30 on 05-28-2014   Atherosclerosis    BPH (benign prostatic hyperplasia)    Cancer (HCC)    Diverticulitis no flare up in years   diet controlled   ED (erectile dysfunction) of organic origin    GERD (gastroesophageal reflux disease)    Hiatal hernia    History of colon polyps    2002 and 2003 hyperplastic   History of gastritis    Hypertension    Left bundle branch block    Penile wart    Phimosis    Wears glasses    Wears hearing aid    bilateral   Wears hearing aid in both ears     Past Surgical History:  Procedure Laterality Date   BIOPSY  08/04/2021   Procedure: BIOPSY;  Surgeon: Ladene Artist, MD;  Location: [facility];  Service: Gastroenterology;;   [place] Meek  N/A 06/09/2015   Procedure: CIRCUMCISION ADULT WITH PENILE BLOCK;  Surgeon: Alexis Frock, MD;  Location: [facility];  Service: Urology;  Laterality: N/A;   COLONOSCOPY  last one 11-15-2011   COLONOSCOPY WITH PROPOFOL N/A 08/04/2021   Procedure: COLONOSCOPY WITH PROPOFOL;  Surgeon: Ladene Artist, MD;  Location: [facility];  Service: Gastroenterology;  Laterality: N/A;   CONDYLOMA EXCISION/FULGURATION N/A 06/09/2020   Procedure: EXCISION OF PENILE WARTS;  Surgeon: Alexis Frock, MD;  Location: [facility];  Service:  Urology;  Laterality: N/A;  [place] RADIATION THERAPY GUIDE  2009   for prostate   ENDOVENOUS ABLATION SAPHENOUS VEIN W/ LASER Left 02/10/2016   endovenous laser ablation left greater saphenous vein and stab phlebectomy left leg by Curt Jews MD   HERNIA REPAIR N/A    [employer] 07/11/2019   INGUINAL HERNIA REPAIR Right 12-23-2009   LIPOMA EXCISION N/A 12/01/2016   Procedure: EXCISION POSTERIOR NECK MASS;  Surgeon: Irene Limbo, MD;  Location: [facility];  Service: Plastics;  Laterality: N/A;   POLYPECTOMY  08/04/2021   Procedure: POLYPECTOMY;  Surgeon: Ladene Artist, MD;  Location: [facility];  Service: Gastroenterology;;   SHOULDER ARTHROSCOPY WITH SUBACROMIAL DECOMPRESSION, ROTATOR CUFF REPAIR AND BICEP TENDON REPAIR Left 10-2014    Social History   Socioeconomic History   Marital status: Married    Spouse name: Not on file   Number of children: 0   Years of education: BS   Highest education level: Not on file  Occupational History   Occupation: SCULPTOR   Occupation: Chief Financial Officer  Tobacco Use   Smoking status: Former    Packs/day: 0.50    Years: 2.00    Total pack years: 1.00    Types: Cigarettes    Quit date: 09/04/1965    Years since quitting: 56.1   Smokeless tobacco: Never   Tobacco comments:    quit around 76 yrs old.  Vaping Use   Vaping Use: Never used  Substance and Sexual Activity   Alcohol use: Yes    Alcohol/week: 20.0 standard drinks of alcohol    Types: 20 Standard drinks or equivalent per week    Comment: drink 1-2 at night    Drug use: No   Sexual activity: Yes    Partners: Female  Other Topics Concern   Not on file  Social History Narrative   ** Merged History Encounter **       Originally from [place]   Trained as an Chief Financial Officer.    Married '75- 27 years, divorced; Married '86. No children.    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 Sculptor-life  size figures    by commission ([place] work [place]  Sculpting work regularly.   Never smoking.   Alcohol: drinks1- 2 beers and 1-2 wines every night;    Learning the piano. Patient get regular exercise.   Wife is a Marine scientist.   Caffeine: 3/day    ADLs: drives; independent with ADLs   Advanced Directives: YES; FULL CODE; no prolonged measures.        Social Determinants of Health   Financial Resource Strain: Low Risk  (07/08/2021)   Overall Financial Resource Strain (CARDIA)    Difficulty of Paying Living Expenses: Not hard at all  Food Insecurity: No Food Insecurity (07/08/2021)   Hunger Vital Sign    Worried About Running Out of Food in the Last Year:  Never true    Ran Out of Food in the Last Year: Never true  Transportation Needs: No Transportation Needs (07/08/2021)   PRAPARE - Hydrologist (Medical): No    Lack of Transportation (Non-Medical): No  Physical Activity: Insufficiently Active (07/08/2021)   Exercise Vital Sign    Days of Exercise per Week: 3 days    Minutes of Exercise per Session: 30 min  Stress: No Stress Concern Present (07/08/2021)   [place]    Feeling of Stress : Only a little  Social Connections: Moderately Isolated (07/08/2021)   Social Connection and Isolation Panel [NHANES]    Frequency of Communication with Friends and Family: Twice a week    Frequency of Social Gatherings with Friends and Family: Twice a week    Attends Religious Services: Never    Marine scientist or Organizations: No    Attends Archivist Meetings: Never    Marital Status: Married  Human resources officer Violence: Not At Risk (07/08/2021)   Humiliation, Afraid, Rape, and Kick questionnaire    Fear of Current or Ex-Partner: No    Emotionally Abused: No    Physically Abused: No    Sexually Abused: No    Family History  Problem Relation Age of Onset   Cancer Father 19        stomach versus colon cancer   Pancreatic cancer Sister 22   Cancer Brother        penile cancer   Esophageal cancer Neg Hx    Throat cancer Neg Hx      Review of Systems  Constitutional: Negative.  Negative for chills and fever.  HENT: Negative.  Negative for congestion and sore throat.   Respiratory: Negative.  Negative for cough and shortness of breath.   Cardiovascular: Negative.  Negative for chest pain and palpitations.  Gastrointestinal:  Negative for abdominal pain, nausea and vomiting.  Genitourinary: Negative.   Skin: Negative.  Negative for rash.  Neurological: Negative.  Negative for dizziness and headaches.  All other systems reviewed and are negative.  Today's Vitals   09/27/21 1300  BP: 134/72  Pulse: 72  Temp: 98 F (36.7 C)  TempSrc: Oral  SpO2: 96%  Weight: 187 lb 2 oz (84.9 kg)  Height: '5\' 10"'$  (1.778 m)   Body mass index is 26.85 kg/m. Wt Readings from Last 3 Encounters:  09/27/21 187 lb 2 oz (84.9 kg)  08/04/21 183 lb (83 kg)  04/12/21 189 lb 8 oz (86 kg)     Physical Exam Vitals reviewed.  Constitutional:      Appearance: Normal appearance.  HENT:     Head: Normocephalic.     Ears:     Comments: Bilateral hearing aids on    Mouth/Throat:     Mouth: Mucous membranes are moist.     Pharynx: Oropharynx is clear.  Eyes:     Extraocular Movements: Extraocular movements intact.     Conjunctiva/sclera: Conjunctivae normal.     Pupils: Pupils are equal, round, and reactive to light.  Cardiovascular:     Rate and Rhythm: Normal rate and regular rhythm.     Pulses: Normal pulses.     Heart sounds: Normal heart sounds.  Pulmonary:     Effort: Pulmonary effort is normal.     Breath sounds: Normal breath sounds.  Abdominal:     General: There is no distension.     Palpations: Abdomen is  soft.     Tenderness: There is no abdominal tenderness.  Musculoskeletal:     Cervical back: No tenderness.     Right lower leg: No edema.     Left lower  leg: No edema.  Lymphadenopathy:     Cervical: No cervical adenopathy.  Skin:    General: Skin is warm and dry.     Capillary Refill: Capillary refill takes less than 2 seconds.  Neurological:     General: No focal deficit present.     Mental Status: He is alert and oriented to person, place, and time.  Psychiatric:        Mood and Affect: Mood normal.        Behavior: Behavior normal.      ASSESSMENT & PLAN: A total of 45 minutes was spent with the patient and counseling/coordination of care regarding preparing for this visit, review of most recent office visit notes, review of most recent blood work results, review of multiple chronic medical problems and their management, review of all medications, education on nutrition, review of health maintenance items, review of most recent colonoscopy report of surgical pathology, prognosis, documentation, and need for follow-up.  Problem List Items Addressed This Visit       Cardiovascular and Mediastinum   Essential hypertension - Primary    Well-controlled hypertension. BP Readings from Last 3 Encounters:  09/27/21 134/72  08/04/21 (!) 147/78  04/12/21 120/64  Continue lisinopril 40 mg and amlodipine 5 mg daily. Cardiovascular risks associated with hypertension discussed.       Relevant Orders   CBC with Differential/Platelet   Comprehensive metabolic panel     Respiratory   Chronic sinusitis     Digestive   GERD    Stable and well-controlled. Continue omeprazole 20 mg daily.        Genitourinary   Enlarged prostate with urinary retention    Stable and not affecting quality of life.        Other   Dyslipidemia    Stable.  Diet and nutrition discussed. Continue atorvastatin 20 mg daily. The 10-year ASCVD risk score (Arnett DK, et al., 2019) is: 34.3%   Values used to calculate the score:     Age: 71 years     Sex: Male     Is Non-Hispanic African American: No     Diabetic: No     Tobacco smoker: No      Systolic Blood Pressure: 834 mmHg     Is BP treated: Yes     HDL Cholesterol: 42.5 mg/dL     Total Cholesterol: 134 mg/dL       Relevant Orders   Hemoglobin A1c   Lipid panel   History of diverticulosis    Recent colonoscopy showed benign colonic polyps. Report reviewed with patient. Surgical report reviewed.      Patient Instructions  Health Maintenance After Age 71 After age 83, you are at a higher risk for certain long-term diseases and infections as well as injuries from falls. Falls are a major cause of broken bones and head injuries in people who are older than age 63. Getting regular preventive care can help to keep you healthy and well. Preventive care includes getting regular testing and making lifestyle changes as recommended by your health care provider. Talk with your health care provider about: Which screenings and tests you should have. A screening is a test that checks for a disease when you have no symptoms. A diet and exercise plan that  is right for you. What should I know about screenings and tests to prevent falls? Screening and testing are the best ways to find a health problem early. Early diagnosis and treatment give you the best chance of managing medical conditions that are common after age 80. Certain conditions and lifestyle choices may make you more likely to have a fall. Your health care provider may recommend: Regular vision checks. Poor vision and conditions such as cataracts can make you more likely to have a fall. If you wear glasses, make sure to get your prescription updated if your vision changes. Medicine review. Work with your health care provider to regularly review all of the medicines you are taking, including over-the-counter medicines. Ask your health care provider about any side effects that may make you more likely to have a fall. Tell your health care provider if any medicines that you take make you feel dizzy or sleepy. Strength and balance  checks. Your health care provider may recommend certain tests to check your strength and balance while standing, walking, or changing positions. Foot health exam. Foot pain and numbness, as well as not wearing proper footwear, can make you more likely to have a fall. Screenings, including: Osteoporosis screening. Osteoporosis is a condition that causes the bones to get weaker and break more easily. Blood pressure screening. Blood pressure changes and medicines to control blood pressure can make you feel dizzy. Depression screening. You may be more likely to have a fall if you have a fear of falling, feel depressed, or feel unable to do activities that you used to do. Alcohol use screening. Using too much alcohol can affect your balance and may make you more likely to have a fall. Follow these instructions at home: Lifestyle Do not drink alcohol if: Your health care provider tells you not to drink. If you drink alcohol: Limit how much you have to: 0-1 drink a day for women. 0-2 drinks a day for men. Know how much alcohol is in your drink. In the [place] one drink equals one 12 oz bottle of beer (355 mL), one 5 oz glass of wine (148 mL), or one 1 oz glass of hard liquor (44 mL). Do not use any products that contain nicotine or tobacco. These products include cigarettes, chewing tobacco, and vaping devices, such as e-cigarettes. If you need help quitting, ask your health care provider. Activity  Follow a regular exercise program to stay fit. This will help you maintain your balance. Ask your health care provider what types of exercise are appropriate for you. If you need a cane or walker, use it as recommended by your health care provider. Wear supportive shoes that have nonskid soles. Safety  Remove any tripping hazards, such as rugs, cords, and clutter. Install safety equipment such as grab bars in bathrooms and safety rails on stairs. Keep rooms and walkways well-lit. General  instructions Talk with your health care provider about your risks for falling. Tell your health care provider if: You fall. Be sure to tell your health care provider about all falls, even ones that seem minor. You feel dizzy, tiredness (fatigue), or off-balance. Take over-the-counter and prescription medicines only as told by your health care provider. These include supplements. Eat a healthy diet and maintain a healthy weight. A healthy diet includes low-fat dairy products, low-fat (lean) meats, and fiber from whole grains, beans, and lots of fruits and vegetables. Stay current with your vaccines. Schedule regular health, dental, and eye exams. Summary Having a healthy  lifestyle and getting preventive care can help to protect your health and wellness after age 20. Screening and testing are the best way to find a health problem early and help you avoid having a fall. Early diagnosis and treatment give you the best chance for managing medical conditions that are more common for people who are older than age 76. Falls are a major cause of broken bones and head injuries in people who are older than age 101. Take precautions to prevent a fall at home. Work with your health care provider to learn what changes you can make to improve your health and wellness and to prevent falls. This information is not intended to replace advice given to you by your health care provider. Make sure you discuss any questions you have with your health care provider. Document Revised: 05/31/2020 Document Reviewed: 05/31/2020 Elsevier Patient Education  Athalia, MD [facility]

## 2021-09-27 NOTE — Assessment & Plan Note (Signed)
Stable and not affecting quality of life.

## 2021-09-28 ENCOUNTER — Encounter: Payer: Self-pay | Admitting: Emergency Medicine

## 2021-09-28 NOTE — Telephone Encounter (Signed)
Okay to take.  Thanks.

## 2021-10-21 ENCOUNTER — Encounter: Payer: Self-pay | Admitting: Family Medicine

## 2021-10-21 ENCOUNTER — Telehealth (INDEPENDENT_AMBULATORY_CARE_PROVIDER_SITE_OTHER): Payer: Medicare Other | Admitting: Family Medicine

## 2021-10-21 DIAGNOSIS — R0602 Shortness of breath: Secondary | ICD-10-CM | POA: Diagnosis not present

## 2021-10-21 DIAGNOSIS — R5383 Other fatigue: Secondary | ICD-10-CM | POA: Diagnosis not present

## 2021-10-21 DIAGNOSIS — R058 Other specified cough: Secondary | ICD-10-CM | POA: Diagnosis not present

## 2021-10-21 MED ORDER — AZITHROMYCIN 250 MG PO TABS: ORAL_TABLET | ORAL | 0 refills | Status: AC

## 2021-10-21 MED ORDER — HYDROCODONE BIT-HOMATROP MBR 5-1.5 MG/5ML PO SOLN: 5.0000 mL | Freq: Three times a day (TID) | ORAL | 0 refills | Status: DC | PRN

## 2021-10-21 NOTE — Progress Notes (Signed)
MyChart Video Visit    Virtual Visit via Video Note   This visit type was conducted due to national recommendations for restrictions regarding the COVID-19 Pandemic (e.g. social distancing) in an effort to limit this patient's exposure and mitigate transmission in our community. This patient is at least at moderate risk for complications without adequate follow up. This format is felt to be most appropriate for this patient at this time. Physical exam was limited by quality of the video and audio technology used for the visit. CMA was able to get the patient set up on a video visit.  Patient location: Home. Patient and provider in visit Provider location: Office  I discussed the limitations of evaluation and management by telemedicine and the availability of in person appointments. The patient expressed understanding and agreed to proceed.  Visit Date: 10/21/2021  Today's healthcare provider: Harland Dingwall, NP-C     Subjective:    Patient ID: Oscar Smith, male    DOB: 04-01-1945, 76 y.o.   MRN: [id number]  Chief Complaint  Patient presents with   Covid Positive    HPI  C/o a 6 day hx of fatigue, productive cough, hoarseness, cannot sleep due to allergy, headache, back pain. Fever 100.3. bad taste in his mouth. Decreased appetite and decreased fluids.  Traveled for the past 2 weeks and just returned. His wife is taking antibiotics for pneumonia.   Does not smoke. Denies hx of lung disease.  Hx of pneumonia.    Negative for Covid last week.   No chest pain, palpitations, abdominal pain, N/V/D.   He has taken Coricidin.     Past Medical History:  Diagnosis Date   Adenocarcinoma of prostate [facility]) urologist-  dr Tresa Moore--  dx Nov 2011--  T1c, Gleason 3+3,  PSA 4.2 (post IMRT 03/2010   Low risk recurrence, Biochemical control,  last PSA 0.30 on 05-28-2014   Atherosclerosis    BPH (benign prostatic hyperplasia)    Cancer (HCC)    Diverticulitis no flare up in  years   diet controlled   ED (erectile dysfunction) of organic origin    GERD (gastroesophageal reflux disease)    Hiatal hernia    History of colon polyps    2002 and 2003 hyperplastic   History of gastritis    Hypertension    Left bundle branch block    Penile wart    Phimosis    Wears glasses    Wears hearing aid    bilateral   Wears hearing aid in both ears     Past Surgical History:  Procedure Laterality Date   BIOPSY  08/04/2021   Procedure: BIOPSY;  Surgeon: Ladene Artist, MD;  Location: [facility];  Service: Gastroenterology;;   CIRCUMCISION N/A 06/09/2015   Procedure: CIRCUMCISION ADULT WITH PENILE BLOCK;  Surgeon: Alexis Frock, MD;  Location: [facility];  Service: Urology;  Laterality: N/A;   COLONOSCOPY  last one 11-15-2011   COLONOSCOPY WITH PROPOFOL N/A 08/04/2021   Procedure: COLONOSCOPY WITH PROPOFOL;  Surgeon: Ladene Artist, MD;  Location: [facility];  Service: Gastroenterology;  Laterality: N/A;   CONDYLOMA EXCISION/FULGURATION N/A 06/09/2020   Procedure: EXCISION OF PENILE WARTS;  Surgeon: Alexis Frock, MD;  Location: [facility];  Service: Urology;  Laterality: N/A;  36 MINS   CT RADIATION THERAPY GUIDE  2009   for prostate   ENDOVENOUS ABLATION SAPHENOUS VEIN W/ LASER Left 02/10/2016   endovenous laser ablation left greater saphenous vein  and stab phlebectomy left leg by Curt Jews MD   HERNIA REPAIR N/A    Phreesia 07/11/2019   INGUINAL HERNIA REPAIR Right 12-23-2009   LIPOMA EXCISION N/A 12/01/2016   Procedure: EXCISION POSTERIOR NECK MASS;  Surgeon: Irene Limbo, MD;  Location: [place];  Service: Plastics;  Laterality: N/A;   POLYPECTOMY  08/04/2021   Procedure: POLYPECTOMY;  Surgeon: Ladene Artist, MD;  Location: [facility];  Service: Gastroenterology;;   SHOULDER ARTHROSCOPY WITH SUBACROMIAL DECOMPRESSION, ROTATOR CUFF REPAIR AND BICEP TENDON REPAIR Left 10-2014    Family History   Problem Relation Age of Onset   Cancer Father 42       stomach versus colon cancer   Pancreatic cancer Sister 43   Cancer Brother        penile cancer   Esophageal cancer Neg Hx    Throat cancer Neg Hx     Social History   Socioeconomic History   Marital status: Married    Spouse name: Not on file   Number of children: 0   Years of education: BS   Highest education level: Not on file  Occupational History   Occupation: Development worker, community   Occupation: Chief Financial Officer  Tobacco Use   Smoking status: Former    Packs/day: 0.50    Years: 2.00    Total pack years: 1.00    Types: Cigarettes    Quit date: 09/04/1965    Years since quitting: 56.1   Smokeless tobacco: Never   Tobacco comments:    quit around 76 yrs old.  Vaping Use   Vaping Use: Never used  Substance and Sexual Activity   Alcohol use: Yes    Alcohol/week: 20.0 standard drinks of alcohol    Types: 20 Standard drinks or equivalent per week    Comment: drink 1-2 at night    Drug use: No   Sexual activity: Yes    Partners: Female  Other Topics Concern   Not on file  Social History Narrative   ** Merged History Encounter **       Originally from [place].   Trained as an Chief Financial Officer.    Married '75- 86 years, divorced; Married '86. No children.    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 Sculptor-life size figures    by commission (see work in [place]  Sculpting work regularly.   Never smoking.   Alcohol: drinks1- 2 beers and 1-2 wines every night;    Learning the piano. Patient get regular exercise.   Wife is a Marine scientist.   Caffeine: 3/day    ADLs: drives; independent with ADLs   Advanced Directives: YES; FULL CODE; no prolonged measures.        Social Determinants of Health   Financial Resource Strain: Low Risk  (07/08/2021)   Overall Financial Resource Strain (CARDIA)    Difficulty of Paying Living Expenses: Not hard at all  Food  Insecurity: No Food Insecurity (07/08/2021)   Hunger Vital Sign    Worried About Running Out of Food in the Last Year: Never true    Ran Out of Food in the Last Year: Never true  Transportation Needs: No Transportation Needs (07/08/2021)   PRAPARE - Hydrologist (Medical): No    Lack of Transportation (Non-Medical): No  Physical Activity: Insufficiently Active (07/08/2021)   Exercise Vital Sign    Days of Exercise per Week: 3 days    Minutes  of Exercise per Session: 30 min  Stress: No Stress Concern Present (07/08/2021)   [place]    Feeling of Stress : Only a little  Social Connections: Moderately Isolated (07/08/2021)   Social Connection and Isolation Panel [NHANES]    Frequency of Communication with Friends and Family: Twice a week    Frequency of Social Gatherings with Friends and Family: Twice a week    Attends Religious Services: Never    Marine scientist or Organizations: No    Attends Archivist Meetings: Never    Marital Status: Married  Human resources officer Violence: Not At Risk (07/08/2021)   Humiliation, Afraid, Rape, and Kick questionnaire    Fear of Current or Ex-Partner: No    Emotionally Abused: No    Physically Abused: No    Sexually Abused: No    Outpatient Medications Prior to Visit  Medication Sig Dispense Refill   amLODipine (NORVASC) 5 MG tablet TAKE 1 TABLET BY MOUTH TWICE DAILY. 180 tablet 3   aspirin EC 81 MG tablet Take 81 mg daily by mouth.     atorvastatin (LIPITOR) 20 MG tablet TAKE 1 TABLET ONCE DAILY. 90 tablet 3   diphenhydramine-acetaminophen (TYLENOL PM) 25-500 MG TABS tablet Take 1 tablet by mouth at bedtime as needed (for sleep).     fluticasone (FLONASE) 50 MCG/ACT nasal spray USE TWO SPRAYS IN EACH NOSTRIL ONCE DAILY (Patient taking differently: Place 1-2 sprays into both nostrils at bedtime.) 48 g 1   lisinopril (ZESTRIL) 40 MG tablet TAKE  ONE TABLET BY MOUTH DAILY 90 tablet 1   Multiple Vitamin (MULTIVITAMIN) tablet Take 1 tablet by mouth daily.     omeprazole (PRILOSEC) 20 MG capsule Take 1 capsule (20 mg total) by mouth daily. 90 capsule 1   No facility-administered medications prior to visit.    Allergies  Allergen Reactions   Flomax [Tamsulosin Hcl] Other (See Comments)    "bad reaction" = jaw pain   Latex Itching and Rash    ROS     Objective:    Physical Exam  There were no vitals taken for this visit. Wt Readings from Last 3 Encounters:  09/27/21 187 lb 2 oz (84.9 kg)  08/04/21 183 lb (83 kg)  04/12/21 189 lb 8 oz (86 kg)   Alert and oriented and in no acute distress. Respirations unlabored. Congested cough. Speaking in sentences without difficulty.     Assessment & Plan:   Problem List Items Addressed This Visit   None Visit Diagnoses     Productive cough    -  Primary   Relevant Medications   HYDROcodone bit-homatropine (HYCODAN) 5-1.5 MG/5ML syrup   azithromycin (ZITHROMAX) 250 MG tablet   Fatigue, unspecified type       Shortness of breath       Relevant Medications   azithromycin (ZITHROMAX) 250 MG tablet      Negative Covid last week. Has not screened for Covid since. Returned home from foreign travel yesterday. Z-pak and Hycodan prescribed.  Recommend Tylenol and Mucinex OTC. Continue Coricidin.  He will monitor vitals including oxygen saturation and if he drops below 90% he will go to the UC or ED.   I am having Oscar Smith start on HYDROcodone bit-homatropine and azithromycin. I am also having him maintain his multivitamin, aspirin EC, diphenhydramine-acetaminophen, amLODipine, atorvastatin, fluticasone, omeprazole, and lisinopril.  Meds ordered this encounter  Medications   HYDROcodone bit-homatropine (HYCODAN) 5-1.5 MG/5ML  syrup    Sig: Take 5 mLs by mouth every 8 (eight) hours as needed for cough.    Dispense:  120 mL    Refill:  0    Order Specific Question:    Supervising Provider    Answer:   Pricilla Holm A [[id number]]   azithromycin (ZITHROMAX) 250 MG tablet    Sig: Take 2 tablets on day 1, then 1 tablet daily on days 2 through 5    Dispense:  6 tablet    Refill:  0    Order Specific Question:   Supervising Provider    Answer:   Pricilla Holm A [[id number]]    I discussed the assessment and treatment plan with the patient. The patient was provided an opportunity to ask questions and all were answered. The patient agreed with the plan and demonstrated an understanding of the instructions.   The patient was advised to call back or seek an in-person evaluation if the symptoms worsen or if the condition fails to improve as anticipated.  I provided 20 minutes of face-to-face time during this encounter.   Harland Dingwall, NP-C [facility] [place] [phone] (phone) [phone] (fax)  [place]

## 2021-10-25 ENCOUNTER — Encounter: Payer: Self-pay | Admitting: Family Medicine

## 2021-10-26 NOTE — Telephone Encounter (Signed)
Pt is inquiring if it is recommended he should get RSV vaccine?

## 2021-11-15 ENCOUNTER — Ambulatory Visit: Payer: Medicare Other | Attending: Cardiology | Admitting: Cardiology

## 2021-11-15 ENCOUNTER — Encounter: Payer: Self-pay | Admitting: Cardiology

## 2021-11-15 VITALS — BP 120/68 | HR 78 | Ht 70.0 in | Wt 188.4 lb

## 2021-11-15 DIAGNOSIS — I2510 Atherosclerotic heart disease of native coronary artery without angina pectoris: Secondary | ICD-10-CM

## 2021-11-15 DIAGNOSIS — I10 Essential (primary) hypertension: Secondary | ICD-10-CM

## 2021-11-15 DIAGNOSIS — I447 Left bundle-branch block, unspecified: Secondary | ICD-10-CM

## 2021-11-15 DIAGNOSIS — R002 Palpitations: Secondary | ICD-10-CM

## 2021-11-15 NOTE — Progress Notes (Signed)
Cardiology Office Note:    Date:  11/15/2021   ID:  Oscar Smith, DOB 11-28-1945, MRN [id number]  PCP:  Horald Pollen, MD  Cardiologist:  None  Electrophysiologist:  None   Referring MD: Horald Pollen, *   Chief Complaint  Patient presents with   Coronary Artery Disease    History of Present Illness:    Oscar Smith is a 76 y.o. male with a hx of prostate cancer, hypertension who presents for follow-up.  He was referred by Dr. Mitchel Honour for evaluation of left bundle branch block, initially seen on 05/09/2019.  He went to the ED on 05/06/2019 for dizziness.  Felt to be consistent with vertigo.  However work-up notable for new left bundle branch block.  High-sensitivity troponins 11->19->20.  Walks 3-4 times per week for about 25 minutes.  No chest pain, dyspnea, or syncope.  Does report intermittent episodes where it feels like heart is racing, occurs 1-2 times per month.  Can last minutes.  TTE on 05/27/19 showed LVEF 55 to 37%, grade 1 diastolic dysfunction, normal RV systolic function no significant valvular disease.  Calcium score 767 on 01/05/2020 (71st percentile).  Lexiscan Myoview on 12/13/2020 showed fixed inferior perfusion defect with normal wall motion consistent with artifact, fixed perfusion defect at apex with hypokinesis consistent with infarct; low risk study, no evidence of ischemia.  Echocardiogram 12/2020 showed EF 55 to 60%, G1 DD, normal RV function, no significant valvular disease, echodensity in right atrium consistent with prominent crista terminalis.  Zio patch x14 days 12/28/2020 showed no significant arrhythmias.  Since last clinic visit, he reports he has been doing okay.  Recently took a trip to [place] and had pneumonia afterwards.  He is on azithromycin and states that he has improved significantly.  He denies any chest pain, dyspnea, lower extremity edema, or palpitations.  Reports some lightheadedness, denies any syncope.  He has  been walking daily for about 1 mile and also walked a lot during the trip to [place].  Past Medical History:  Diagnosis Date   Adenocarcinoma of prostate [facility]) urologist-  dr Tresa Moore--  dx Nov 2011--  T1c, Gleason 3+3,  PSA 4.2 (post IMRT 03/2010   Low risk recurrence, Biochemical control,  last PSA 0.30 on 05-28-2014   Atherosclerosis    BPH (benign prostatic hyperplasia)    Cancer (HCC)    Diverticulitis no flare up in years   diet controlled   ED (erectile dysfunction) of organic origin    GERD (gastroesophageal reflux disease)    Hiatal hernia    History of colon polyps    2002 and 2003 hyperplastic   History of gastritis    Hypertension    Left bundle branch block    Penile wart    Phimosis    Wears glasses    Wears hearing aid    bilateral   Wears hearing aid in both ears     Past Surgical History:  Procedure Laterality Date   BIOPSY  08/04/2021   Procedure: BIOPSY;  Surgeon: Ladene Artist, MD;  Location: [facility];  Service: Gastroenterology;;   CIRCUMCISION N/A 06/09/2015   Procedure: CIRCUMCISION ADULT WITH PENILE BLOCK;  Surgeon: Alexis Frock, MD;  Location: [facility];  Service: Urology;  Laterality: N/A;   COLONOSCOPY  last one 11-15-2011   COLONOSCOPY WITH PROPOFOL N/A 08/04/2021   Procedure: COLONOSCOPY WITH PROPOFOL;  Surgeon: Ladene Artist, MD;  Location: [facility];  Service: Gastroenterology;  Laterality:  N/A;   CONDYLOMA EXCISION/FULGURATION N/A 06/09/2020   Procedure: EXCISION OF PENILE WARTS;  Surgeon: Alexis Frock, MD;  Location[facility];  Service: Urology;  Laterality: N/A;  [place] RADIATION THERAPY GUIDE  2009   for prostate   ENDOVENOUS ABLATION SAPHENOUS VEIN W/ LASER Left 02/10/2016   endovenous laser ablation left greater saphenous vein and stab phlebectomy left leg by Curt Jews MD   HERNIA REPAIR N/A    Phreesia 07/11/2019   INGUINAL HERNIA REPAIR Right 12-23-2009   LIPOMA EXCISION N/A  12/01/2016   Procedure: EXCISION POSTERIOR NECK MASS;  Surgeon: Irene Limbo, MD;  Location: [place];  Service: Plastics;  Laterality: N/A;   POLYPECTOMY  08/04/2021   Procedure: POLYPECTOMY;  Surgeon: Ladene Artist, MD;  Location: [facility];  Service: Gastroenterology;;   SHOULDER ARTHROSCOPY WITH SUBACROMIAL DECOMPRESSION, ROTATOR CUFF REPAIR AND BICEP TENDON REPAIR Left 10-2014    Current Medications: Current Meds  Medication Sig   amLODipine (NORVASC) 5 MG tablet TAKE 1 TABLET BY MOUTH TWICE DAILY.   amoxicillin (AMOXIL) 500 MG capsule Take 500 mg by mouth 2 (two) times daily.   aspirin EC 81 MG tablet Take 81 mg daily by mouth.   atorvastatin (LIPITOR) 20 MG tablet TAKE 1 TABLET ONCE DAILY.   diphenhydramine-acetaminophen (TYLENOL PM) 25-500 MG TABS tablet Take 1 tablet by mouth at bedtime as needed (for sleep).   fluticasone (FLONASE) 50 MCG/ACT nasal spray USE TWO SPRAYS IN EACH NOSTRIL ONCE DAILY (Patient taking differently: Place 1-2 sprays into both nostrils at bedtime.)   lisinopril (ZESTRIL) 40 MG tablet TAKE ONE TABLET BY MOUTH DAILY   Multiple Vitamin (MULTIVITAMIN) tablet Take 1 tablet by mouth daily.   omeprazole (PRILOSEC) 20 MG capsule Take 1 capsule (20 mg total) by mouth daily.     Allergies:   Flomax [tamsulosin hcl] and Latex   Social History   Socioeconomic History   Marital status: Married    Spouse name: Not on file   Number of children: 0   Years of education: BS   Highest education level: Not on file  Occupational History   Occupation: Development worker, community   Occupation: Chief Financial Officer  Tobacco Use   Smoking status: Former    Packs/day: 0.50    Years: 2.00    Total pack years: 1.00    Types: Cigarettes    Quit date: 09/04/1965    Years since quitting: 56.2   Smokeless tobacco: Never   Tobacco comments:    quit around 76 yrs old.  Vaping Use   Vaping Use: Never used  Substance and Sexual Activity   Alcohol use: Yes    Alcohol/week:  20.0 standard drinks of alcohol    Types: 20 Standard drinks or equivalent per week    Comment: drink 1-2 at night    Drug use: No   Sexual activity: Yes    Partners: Female  Other Topics Concern   Not on file  Social History Narrative   ** Merged History Encounter **       Originally from [place].   Trained as an Chief Financial Officer.    Married '75- 17 years, divorced; Married '86. No children.    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 Sculptor-life size figures    by commission (see work in [place]  Sculpting work regularly.   Never smoking.   Alcohol: drinks1- 2 beers and 1-2 wines every night;  Learning the piano. Patient get regular exercise.   Wife is a Marine scientist.   Caffeine: 3/day    ADLs: drives; independent with ADLs   Advanced Directives: YES; FULL CODE; no prolonged measures.        Social Determinants of Health   Financial Resource Strain: Low Risk  (07/08/2021)   Overall Financial Resource Strain (CARDIA)    Difficulty of Paying Living Expenses: Not hard at all  Food Insecurity: No Food Insecurity (07/08/2021)   Hunger Vital Sign    Worried About Running Out of Food in the Last Year: Never true    Ran Out of Food in the Last Year: Never true  Transportation Needs: No Transportation Needs (07/08/2021)   PRAPARE - Hydrologist (Medical): No    Lack of Transportation (Non-Medical): No  Physical Activity: Insufficiently Active (07/08/2021)   Exercise Vital Sign    Days of Exercise per Week: 3 days    Minutes of Exercise per Session: 30 min  Stress: No Stress Concern Present (07/08/2021)   [place]    Feeling of Stress : Only a little  Social Connections: Moderately Isolated (07/08/2021)   Social Connection and Isolation Panel [NHANES]    Frequency of Communication with Friends and Family: Twice a week     Frequency of Social Gatherings with Friends and Family: Twice a week    Attends Religious Services: Never    Marine scientist or Organizations: No    Attends Music therapist: Never    Marital Status: Married     Family History: The patient'sfamily history includes Cancer in his brother; Cancer (age of onset: 52) in his father; Pancreatic cancer (age of onset: 45) in his sister. There is no history of Esophageal cancer or Throat cancer.  ROS:   Please see the history of present illness.    All other systems reviewed and are negative.  EKGs/Labs/Other Studies Reviewed:    The following studies were reviewed today:  EKG:   11/15/2021: Sinus rhythm, first-degree AV block, left bundle branch block, rate 78 12/02/2020: Sinus rhythm with first-degree AV block, left bundle branch block, rate 67   TTE 05/27/19:  1. Left ventricular ejection fraction, by estimation, is 55 to 60%. The  left ventricle has normal function. The left ventricle has no regional  wall motion abnormalities. Left ventricular diastolic parameters are  consistent with Grade I diastolic  dysfunction (impaired relaxation).   2. Right ventricular systolic function is normal. The right ventricular  size is mildly enlarged.   3. Left atrial size was mildly dilated.   4. The mitral valve is normal in structure. Trivial mitral valve  regurgitation. No evidence of mitral stenosis.   5. The aortic valve is tricuspid. Aortic valve regurgitation is not  visualized. Mild aortic valve sclerosis is present, with no evidence of  aortic valve stenosis.   6. The inferior vena cava is normal in size with greater than 50%  respiratory variability, suggesting right atrial pressure of 3 mmHg.   Recent Labs: 09/27/2021: ALT 27; BUN 10; Creatinine, Ser 0.81; Hemoglobin 14.0; Platelets 186.0; Potassium 3.9; Sodium 134  Recent Lipid Panel    Component Value Date/Time   CHOL 133 09/27/2021 1326   CHOL 153  09/17/2019 0957   TRIG 216.0 (H) 09/27/2021 1326   HDL 39.40 09/27/2021 1326   HDL 46 09/17/2019 0957   CHOLHDL 3 09/27/2021 1326   VLDL 43.2 (H)  09/27/2021 1326   LDLCALC 69 09/15/2020 1005   LDLCALC 90 09/17/2019 0957   LDLDIRECT 68.0 09/27/2021 1326    Physical Exam:    VS:  BP 120/68 (BP Location: Left Arm, Patient Position: Sitting, Cuff Size: Normal)   Pulse 78   Ht '5\' 10"'$  (1.778 m)   Wt 188 lb 6.4 oz (85.5 kg)   SpO2 98%   BMI 27.03 kg/m     Wt Readings from Last 3 Encounters:  11/15/21 188 lb 6.4 oz (85.5 kg)  09/27/21 187 lb 2 oz (84.9 kg)  08/04/21 183 lb (83 kg)     GEN: Well nourished, well developed in no acute distress HEENT: Normal NECK: No JVD CARDIAC: RRR, no murmurs, rubs, gallops RESPIRATORY:  Clear to auscultation without rales, wheezing or rhonchi  ABDOMEN: Soft, non-tender, non-distended MUSCULOSKELETAL:  No edema SKIN: Warm and dry NEUROLOGIC:  Alert and oriented x 3 PSYCHIATRIC:  Normal affect   ASSESSMENT:    1. CAD in native artery   2. LBBB (left bundle branch block)   3. Palpitations   4. Essential hypertension      PLAN:    CAD:  Calcium score 767 on 01/05/2020 (71st percentile).  Lexiscan Myoview on 12/13/2020 showed fixed inferior perfusion defect with normal wall motion consistent with artifact, fixed perfusion defect at apex with hypokinesis consistent with infarct; low risk study, no evidence of ischemia.   -Continue aspirin 81 mg daily -Continue atorvastatin 20 mg daily.  LDL 68 on 09/27/21.  Left bundle branch block: Echocardiogram 12/2020 showed EF 55 to 60%, G1 DD, normal RV function, no significant valvular disease, echodensity in right atrium consistent with prominent crista terminalis.    Palpitations: Zio patch x14 days 12/28/2020 showed no significant arrhythmias.  Hypertension: On lisinopril 40 mg daily and amlodipine 5 mg twice daily.  Appears controlled  Hyperlipidemia: On atorvastatin 20 mg daily.  LDL 68 on  09/27/21.  Calcium score 767 on 01/05/2020 (71st percentile)  Snoring/daytime somnolence: Normal sleep study   RTC in 1 year      Medication Adjustments/Labs and Tests Ordered: Current medicines are reviewed at length with the patient today.  Concerns regarding medicines are outlined above.  Orders Placed This Encounter  Procedures   EKG 12-Lead    No orders of the defined types were placed in this encounter.    Patient Instructions  Medication Instructions:  Your physician recommends that you continue on your current medications as directed. Please refer to the Current Medication list given to you today.  *If you need a refill on your cardiac medications before your next appointment, please call your pharmacy*  Follow-Up: At [facility], you and your health needs are our priority.  As part of our continuing mission to provide you with exceptional heart care, we have created designated Provider Care Teams.  These Care Teams include your primary Cardiologist (physician) and Advanced Practice Providers (APPs -  Physician Assistants and Nurse Practitioners) who all work together to provide you with the care you need, when you need it.  We recommend signing up for the patient portal called "MyChart".  Sign up information is provided on this After Visit Summary.  MyChart is used to connect with patients for Virtual Visits (Telemedicine).  Patients are able to view lab/test results, encounter notes, upcoming appointments, etc.  Non-urgent messages can be sent to your provider as well.   To learn more about what you can do with MyChart, go to [email or web address].  Your next appointment:   12 month(s)  The format for your next appointment:   In Person  Provider:   Dr. Gardiner Rhyme          Signed, Donato Heinz, MD  11/15/2021 9:30 AM    [place]

## 2021-11-15 NOTE — Patient Instructions (Signed)
Medication Instructions:  Your physician recommends that you continue on your current medications as directed. Please refer to the Current Medication list given to you today.  *If you need a refill on your cardiac medications before your next appointment, please call your pharmacy*  Follow-Up: At [facility], you and your health needs are our priority.  As part of our continuing mission to provide you with exceptional heart care, we have created designated Provider Care Teams.  These Care Teams include your primary Cardiologist (physician) and Advanced Practice Providers (APPs -  Physician Assistants and Nurse Practitioners) who all work together to provide you with the care you need, when you need it.  We recommend signing up for the patient portal called "MyChart".  Sign up information is provided on this After Visit Summary.  MyChart is used to connect with patients for Virtual Visits (Telemedicine).  Patients are able to view lab/test results, encounter notes, upcoming appointments, etc.  Non-urgent messages can be sent to your provider as well.   To learn more about what you can do with MyChart, go to [email or web address].    Your next appointment:   12 month(s)  The format for your next appointment:   In Person  Provider:   Dr. Schumann       

## 2021-11-28 DIAGNOSIS — R3912 Poor urinary stream: Secondary | ICD-10-CM | POA: Diagnosis not present

## 2021-12-04 ENCOUNTER — Other Ambulatory Visit: Payer: Self-pay | Admitting: Emergency Medicine

## 2021-12-05 ENCOUNTER — Other Ambulatory Visit: Payer: Self-pay | Admitting: Urology

## 2021-12-07 ENCOUNTER — Encounter (HOSPITAL_BASED_OUTPATIENT_CLINIC_OR_DEPARTMENT_OTHER): Payer: Self-pay | Admitting: Urology

## 2021-12-07 NOTE — Progress Notes (Addendum)
Addendum:  pt had left message today.  Called pt back ,  states has rash armpits and wants to know if ok to use hydrocortisone cream like he has done before , told it was ok, and added it to med list.   Spoke w/ via phone for pre-op interview--- pt and pt's wife Lab needs dos----  [employer] results------ current EKG in epic/ chart COVID test -----patient states asymptomatic no test needed Arrive at -------  1200 on 12-14-2021 NPO after MN NO Solid Food.  Clear liquids from MN until--- 1100 Med rec completed Medications to take morning of surgery ----- norvasc, prilosec Diabetic medication ----- n/a Patient instructed no nail polish to be worn day of surgery Patient instructed to bring photo id and insurance card day of surgery Patient aware to have Driver (ride ) / caregiver for 24 hours after surgery --- wife, rebecca Patient Special Instructions ----- n/a Pre-Op special Istructions ----- n/a Patient verbalized understanding of instructions that were given at this phone interview. Patient denies shortness of breath, chest pain, fever, cough at this phone interview.   Anesthesia Review:  HTN;  LBBB w/ first degree HB;  nonob cad native artery  PCP:  Dr Leane Platt Cardiologist :  Dr Levada Dy (lov 11-15-2021 epic) EKG :  11-15-2021 Echo : 12-22--2022 Stress test: nuclear 12-13-2020 Cardiac Cath : no Activity level:  denies sob w/ any activity  Blood Thinner/ Instructions /Last Dose:  no ASA / Instructions/ Last Dose : ASA '81mg'$ /  per pt given instructions from Dr Tresa Moore office to stop prior to surgery, last dose 12-05-2021

## 2021-12-14 ENCOUNTER — Ambulatory Visit (HOSPITAL_BASED_OUTPATIENT_CLINIC_OR_DEPARTMENT_OTHER)
Admission: RE | Admit: 2021-12-14 | Discharge: 2021-12-14 | Disposition: A | Discharge status: Home / Self Care | Payer: Medicare Other | Source: Ambulatory Visit | Attending: Urology | Admitting: Urology

## 2021-12-14 ENCOUNTER — Other Ambulatory Visit: Payer: Self-pay

## 2021-12-14 ENCOUNTER — Ambulatory Visit (HOSPITAL_BASED_OUTPATIENT_CLINIC_OR_DEPARTMENT_OTHER): Payer: Medicare Other | Admitting: Anesthesiology

## 2021-12-14 ENCOUNTER — Encounter (HOSPITAL_BASED_OUTPATIENT_CLINIC_OR_DEPARTMENT_OTHER): Payer: Self-pay | Admitting: Urology

## 2021-12-14 ENCOUNTER — Encounter (HOSPITAL_BASED_OUTPATIENT_CLINIC_OR_DEPARTMENT_OTHER)
Admission: RE | Disposition: A | Discharge status: Home / Self Care | Payer: Self-pay | Source: Ambulatory Visit | Attending: Urology

## 2021-12-14 DIAGNOSIS — D4959 Neoplasm of unspecified behavior of other genitourinary organ: Secondary | ICD-10-CM

## 2021-12-14 DIAGNOSIS — I2510 Atherosclerotic heart disease of native coronary artery without angina pectoris: Secondary | ICD-10-CM | POA: Insufficient documentation

## 2021-12-14 DIAGNOSIS — K219 Gastro-esophageal reflux disease without esophagitis: Secondary | ICD-10-CM | POA: Diagnosis not present

## 2021-12-14 DIAGNOSIS — I10 Essential (primary) hypertension: Secondary | ICD-10-CM | POA: Insufficient documentation

## 2021-12-14 DIAGNOSIS — N471 Phimosis: Secondary | ICD-10-CM | POA: Insufficient documentation

## 2021-12-14 DIAGNOSIS — C602 Malignant neoplasm of body of penis: Secondary | ICD-10-CM | POA: Insufficient documentation

## 2021-12-14 DIAGNOSIS — C61 Malignant neoplasm of prostate: Secondary | ICD-10-CM | POA: Diagnosis not present

## 2021-12-14 DIAGNOSIS — Z87891 Personal history of nicotine dependence: Secondary | ICD-10-CM | POA: Diagnosis not present

## 2021-12-14 DIAGNOSIS — C609 Malignant neoplasm of penis, unspecified: Secondary | ICD-10-CM | POA: Diagnosis not present

## 2021-12-14 DIAGNOSIS — Z01818 Encounter for other preprocedural examination: Secondary | ICD-10-CM

## 2021-12-14 DIAGNOSIS — Z923 Personal history of irradiation: Secondary | ICD-10-CM | POA: Insufficient documentation

## 2021-12-14 DIAGNOSIS — K449 Diaphragmatic hernia without obstruction or gangrene: Secondary | ICD-10-CM | POA: Diagnosis not present

## 2021-12-14 HISTORY — DX: Atrioventricular block, first degree: I44.0

## 2021-12-14 HISTORY — DX: Personal history of other diseases of the digestive system: Z87.19

## 2021-12-14 HISTORY — DX: Other obstructive and reflux uropathy: N13.8

## 2021-12-14 HISTORY — DX: Malignant neoplasm of penis, unspecified: C60.9

## 2021-12-14 HISTORY — DX: Personal history of other infectious and parasitic diseases: Z86.19

## 2021-12-14 HISTORY — DX: Atherosclerotic heart disease of native coronary artery without angina pectoris: I25.10

## 2021-12-14 HISTORY — PX: PENILE BIOPSY: SHX6013

## 2021-12-14 LAB — POCT I-STAT, CHEM 8
BUN: 12 mg/dL (ref 8–23)
Calcium, Ion: 1.26 mmol/L (ref 1.15–1.40)
Chloride: 100 mmol/L (ref 98–111)
Creatinine, Ser: 0.8 mg/dL (ref 0.61–1.24)
Glucose, Bld: 95 mg/dL (ref 70–99)
HCT: 46 % (ref 39.0–52.0)
Hemoglobin: 15.6 g/dL (ref 13.0–17.0)
Potassium: 4 mmol/L (ref 3.5–5.1)
Sodium: 136 mmol/L (ref 135–145)
TCO2: 25 mmol/L (ref 22–32)

## 2021-12-14 SURGERY — BIOPSY, PENIS
Anesthesia: General | Site: Penis

## 2021-12-14 MED ORDER — ONDANSETRON HCL 4 MG/2ML IJ SOLN
INTRAMUSCULAR | Status: DC | PRN
  Administered 2021-12-14: 4 mg via INTRAVENOUS

## 2021-12-14 MED ORDER — SENNA 8.6 MG PO TABS: 1.0000 | ORAL_TABLET | Freq: Every day | ORAL | 0 refills | Status: AC

## 2021-12-14 MED ORDER — CEFAZOLIN SODIUM-DEXTROSE 2-4 GM/100ML-% IV SOLN
2.0000 g | INTRAVENOUS | Status: AC
  Administered 2021-12-14: 2 g via INTRAVENOUS

## 2021-12-14 MED ORDER — LACTATED RINGERS IV SOLN
INTRAVENOUS | Status: DC
  Administered 2021-12-14: 1000 mL via INTRAVENOUS

## 2021-12-14 MED ORDER — TRAMADOL HCL 50 MG PO TABS: 50.0000 mg | ORAL_TABLET | Freq: Four times a day (QID) | ORAL | 0 refills | Status: DC | PRN

## 2021-12-14 MED ORDER — LIDOCAINE HCL (CARDIAC) PF 100 MG/5ML IV SOSY
PREFILLED_SYRINGE | INTRAVENOUS | Status: DC | PRN
  Administered 2021-12-14: 30 mg via INTRAVENOUS

## 2021-12-14 MED ORDER — FENTANYL CITRATE (PF) 100 MCG/2ML IJ SOLN
INTRAMUSCULAR | Status: AC
  Filled 2021-12-14: qty 2

## 2021-12-14 MED ORDER — FENTANYL CITRATE (PF) 100 MCG/2ML IJ SOLN
INTRAMUSCULAR | Status: DC | PRN
  Administered 2021-12-14: 50 ug via INTRAVENOUS

## 2021-12-14 MED ORDER — DEXAMETHASONE SODIUM PHOSPHATE 4 MG/ML IJ SOLN
INTRAMUSCULAR | Status: DC | PRN
  Administered 2021-12-14: 4 mg via INTRAVENOUS

## 2021-12-14 MED ORDER — 0.9 % SODIUM CHLORIDE (POUR BTL) OPTIME
TOPICAL | Status: DC | PRN
  Administered 2021-12-14: 500 mL

## 2021-12-14 MED ORDER — CEFAZOLIN SODIUM-DEXTROSE 2-4 GM/100ML-% IV SOLN
INTRAVENOUS | Status: AC
  Filled 2021-12-14: qty 100

## 2021-12-14 MED ORDER — LIDOCAINE HCL (PF) 1 % IJ SOLN
INTRAMUSCULAR | Status: DC | PRN
  Administered 2021-12-14: 40 mL via SURGICAL_CAVITY

## 2021-12-14 MED ORDER — DOCUSATE SODIUM 100 MG PO CAPS: 100.0000 mg | ORAL_CAPSULE | Freq: Two times a day (BID) | ORAL | 0 refills | Status: DC

## 2021-12-14 MED ORDER — PROPOFOL 10 MG/ML IV BOLUS
INTRAVENOUS | Status: DC | PRN
  Administered 2021-12-14: 130 mg via INTRAVENOUS

## 2021-12-14 MED ORDER — EPHEDRINE SULFATE (PRESSORS) 50 MG/ML IJ SOLN
INTRAMUSCULAR | Status: DC | PRN
  Administered 2021-12-14 (×2): 10 mg via INTRAVENOUS

## 2021-12-14 SURGICAL SUPPLY — 41 items
BLADE SURG 15 STRL LF DISP TIS (BLADE) ×1 IMPLANT
BLADE SURG 15 STRL SS (BLADE) ×1
BNDG CMPR 5X2 CHSV 1 LYR STRL (GAUZE/BANDAGES/DRESSINGS) ×1
BNDG CMPR 5X2 CHSV STRCH NS (GAUZE/BANDAGES/DRESSINGS) ×1
BNDG CMPR 75X21 PLY HI ABS (MISCELLANEOUS) ×1
BNDG COHESIVE 2X5 TAN NS LF (GAUZE/BANDAGES/DRESSINGS) ×1 IMPLANT
BNDG COHESIVE 2X5 TAN ST LF (GAUZE/BANDAGES/DRESSINGS) ×1 IMPLANT
BNDG GZE 12X3 1 PLY HI ABS (GAUZE/BANDAGES/DRESSINGS) ×1
BNDG PREMIER COHESIVE 2X5 TAN (GAUZE/BANDAGES/DRESSINGS) IMPLANT
BNDG STRETCH GAUZE 3IN X12FT (GAUZE/BANDAGES/DRESSINGS) IMPLANT
COVER BACK TABLE 60X90IN (DRAPES) ×1 IMPLANT
COVER MAYO STAND STRL (DRAPES) ×1 IMPLANT
DRAPE LAPAROTOMY 100X72 PEDS (DRAPES) ×1 IMPLANT
ELECT NDL TIP 2.8 STRL (NEEDLE) ×1 IMPLANT
ELECT NEEDLE TIP 2.8 STRL (NEEDLE) ×1 IMPLANT
ELECT REM PT RETURN 9FT ADLT (ELECTROSURGICAL) ×1
ELECTRODE REM PT RTRN 9FT ADLT (ELECTROSURGICAL) ×1 IMPLANT
GAUZE 4X4 16PLY ~~LOC~~+RFID DBL (SPONGE) ×1 IMPLANT
GAUZE SPONGE 4X4 12PLY STRL (GAUZE/BANDAGES/DRESSINGS) IMPLANT
GAUZE STRETCH 2X75IN STRL (MISCELLANEOUS) ×1 IMPLANT
GAUZE XEROFORM 1X8 LF (GAUZE/BANDAGES/DRESSINGS) ×1 IMPLANT
GLOVE BIO SURGEON STRL SZ7.5 (GLOVE) ×1 IMPLANT
GOWN STRL REUS W/TWL LRG LVL3 (GOWN DISPOSABLE) ×2 IMPLANT
KIT TURNOVER CYSTO (KITS) ×1 IMPLANT
MANIFOLD NEPTUNE II (INSTRUMENTS) IMPLANT
NDL HYPO 25X1 1.5 SAFETY (NEEDLE) IMPLANT
NEEDLE HYPO 25X1 1.5 SAFETY (NEEDLE) ×1 IMPLANT
NS IRRIG 500ML POUR BTL (IV SOLUTION) IMPLANT
PACK BASIN DAY SURGERY FS (CUSTOM PROCEDURE TRAY) ×1 IMPLANT
PENCIL SMOKE EVACUATOR (MISCELLANEOUS) ×1 IMPLANT
SOL PREP POV-IOD 4OZ 10% (MISCELLANEOUS) IMPLANT
SPIKE FLUID TRANSFER (MISCELLANEOUS) IMPLANT
SUT CHROMIC 4 0 PS 2 18 (SUTURE) IMPLANT
SUT VIC AB 3-0 SH 27 (SUTURE) ×3
SUT VIC AB 3-0 SH 27X BRD (SUTURE) IMPLANT
SYR CONTROL 10ML LL (SYRINGE) IMPLANT
TOWEL OR 17X24 6PK STRL BLUE (TOWEL DISPOSABLE) IMPLANT
TOWEL OR 17X26 10 PK STRL BLUE (TOWEL DISPOSABLE) ×1 IMPLANT
TRAY DSU PREP LF (CUSTOM PROCEDURE TRAY) ×1 IMPLANT
TUBE CONNECTING 12X1/4 (SUCTIONS) IMPLANT
WATER STERILE IRR 500ML POUR (IV SOLUTION) IMPLANT

## 2021-12-14 NOTE — Anesthesia Preprocedure Evaluation (Addendum)
Anesthesia Evaluation  Patient identified by MRN, date of birth, ID band Patient awake    Reviewed: Allergy & Precautions, NPO status , Patient's Chart, lab work & pertinent test results  Airway Mallampati: II  TM Distance: >3 FB Neck ROM: Full    Dental  (+) Teeth Intact, Dental Advisory Given   Pulmonary former smoker   breath sounds clear to auscultation       Cardiovascular hypertension, Pt. on medications + CAD  + dysrhythmias  Rhythm:Regular Rate:Normal  Echo: 1. Left ventricular ejection fraction, by estimation, is 55 to 60%. Left  ventricular ejection fraction by 3D volume is 57 %. The left ventricle has  normal function. The left ventricle has no regional wall motion  abnormalities. Left ventricular diastolic   parameters are consistent with Grade I diastolic dysfunction (impaired  relaxation). The average left ventricular global longitudinal strain is  -20.4 %. The global longitudinal strain is normal.   2. Right ventricular systolic function is normal. The right ventricular  size is normal. Tricuspid regurgitation signal is inadequate for assessing  PA pressure.   3. Echodensity seen in the right atrium morphologically consistent with a  prominent crista terminalis. There is no change from it on 2021 study,  though in this study density appears cloeser to the IVC (not well seen in  prior subcostal views).   4. The mitral valve is normal in structure. No evidence of mitral valve  regurgitation. No evidence of mitral stenosis.   5. The aortic valve was not well visualized. Aortic valve regurgitation  is not visualized. No aortic stenosis is present.     Neuro/Psych  PSYCHIATRIC DISORDERS  Depression     Neuromuscular disease    GI/Hepatic Neg liver ROS, hiatal hernia,GERD  Medicated,,  Endo/Other  negative endocrine ROS    Renal/GU negative Renal ROS     Musculoskeletal  (+) Arthritis ,    Abdominal    Peds  Hematology negative hematology ROS (+)   Anesthesia Other Findings   Reproductive/Obstetrics                             Anesthesia Physical Anesthesia Plan  ASA: 2  Anesthesia Plan: General   Post-op Pain Management:    Induction: Intravenous  PONV Risk Score and Plan: 3 and Ondansetron, Dexamethasone and Treatment may vary due to age or medical condition  Airway Management Planned: LMA  Additional Equipment: None  Intra-op Plan:   Post-operative Plan: Extubation in OR  Informed Consent: I have reviewed the patients History and Physical, chart, labs and discussed the procedure including the risks, benefits and alternatives for the proposed anesthesia with the patient or authorized representative who has indicated his/her understanding and acceptance.     Dental advisory given  Plan Discussed with: CRNA  Anesthesia Plan Comments:        Anesthesia Quick Evaluation

## 2021-12-14 NOTE — H&P (Signed)
Oscar Smith is an 76 y.o. male.    Chief Complaint: Pre-OP Local Excision Penile Cancer   HPI:   1 - Low Risk Prostate Cancer - s/p IMRT 2012 (Dr. Valere Dross) for 4/12 cores Gleason 6 (Dr. Bernardo Heater). TRUS 87gm at time. Annual PSA x 10 years until 2022 at which point 0.13 / DRE scarified   2 - Phimosis - s/p circ 05/2015 for progressive phimosis. He does have some mild chronic glans erythema.   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 Failed topical prior. H/o circumcsion very late in life and prior pelvic radiation.   Recent Course:  12/2020: abtou 1.5cm area stable subltle glans erythema and distal left shaft (stable x years), no palpable adenopathy.  05/2021: stable exam, no palpable adenopathy; 56/3893; more concertn for local recurrence abtou 2cm area left distal shatft / corona area, no palpable adenopathy.    PMH unremarkable. 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 His PCP is Agustina Caroli MD    Today Oscar Smith is seen in f/u above and penile / groin exam + PVR. His exam today is more concernign for local recurrence of penile cancer.   Past Medical History:  Diagnosis Date   BPH with obstruction/lower urinary tract symptoms    urologist--- dr Tresa Moore   CAD (coronary artery disease)    cardiologist--- dr c. Gardiner Rhyme;  per  note native artery;   nuclear stress test 12-13-2020  low risk w/ fixed perfusion defect apex w/ hypokinesis consistant w/ prior infart,  normal wall motion,  nuclear ef 54%;   coronary CT 01-05-2020  score= 767   ED (erectile dysfunction) of organic origin    First degree heart block    GERD (gastroesophageal reflux disease)    Hiatal hernia    History of colon polyps    2002 and 2003 hyperplastic   History of condyloma acuminatum    penis   History of diverticulitis of colon    History of gastritis     Hypertension    Left bundle branch block    Malignant neoplasm prostate [facility]) 11/2009   urologist--- dr Tresa Moore;   dx 11/ 2011,  gleason 3+3,  completed IMRT 03/ 2012   Penile cancer [facility])    urologist--- dr Tresa Moore ;    recurrent 11/ 2023   Wears glasses    Wears hearing aid in both ears     Past Surgical History:  Procedure Laterality Date   BIOPSY  08/04/2021   Procedure: BIOPSY;  Surgeon: Ladene Artist, MD;  Location: [facility];  Service: Gastroenterology;;   [place] Me[place] N/A 06/09/2015   Procedure: CIRCUMCISION ADULT WITH PENILE BLOCK;  Surgeon: Alexis Frock, MD;  Location: [facility];  Service: Urology;  Laterality: N/A;   COLONOSCOPY WITH PROPOFOL N/A 08/04/2021   Procedure: COLONOSCOPY WITH PROPOFOL;  Surgeon: Ladene Artist, MD;  Location: [facility];  Service: Gastroenterology;  Laterality: N/A;   CONDYLOMA EXCISION/FULGURATION N/A 06/09/2020   Procedure: EXCISION OF PENILE WARTS;  Surgeon: Alexis Frock, MD;  Location: [facility];  Service: Urology;  Laterality: N/A;  13 MINS   ENDOVENOUS ABLATION SAPHENOUS VEIN W/ LASER Left 02/10/2016   endovenous laser ablation left greater saphenous vein and stab phlebectomy left leg by Curt Jews MD   INGUINAL HERNIA REPAIR Right 12/23/2009   '@[facility]'$  by dr Margot Chimes  LIPOMA EXCISION N/A 12/01/2016   Procedure: EXCISION POSTERIOR NECK MASS;  Surgeon: Irene Limbo, MD;  Location: [place];  Service: Plastics;  Laterality: N/A;   POLYPECTOMY  08/04/2021   Procedure: POLYPECTOMY;  Surgeon: Ladene Artist, MD;  Location: [facility];  Service: Gastroenterology;;   SHOULDER ARTHROSCOPY WITH SUBACROMIAL DECOMPRESSION, ROTATOR CUFF REPAIR AND BICEP TENDON REPAIR Left 10/2014    Family History  Problem Relation Age of Onset   Cancer Father 81       stomach versus colon cancer   Pancreatic cancer Sister 23   Cancer Brother        penile cancer   Esophageal cancer Neg Hx     Throat cancer Neg Hx    Social History:  reports that he quit smoking about 56 years ago. His smoking use included cigarettes. He has a 1.00 pack-year smoking history. He has never used smokeless tobacco. He reports current alcohol use of about 20.0 standard drinks of alcohol per week. He reports that he does not use drugs.  Allergies:  Allergies  Allergen Reactions   Flomax [Tamsulosin Hcl] Other (See Comments)    "bad reaction" = jaw pain   Latex Itching and Rash    Medications Prior to Admission  Medication Sig Dispense Refill   amLODipine (NORVASC) 5 MG tablet TAKE 1 TABLET BY MOUTH TWICE DAILY. (Patient taking differently: Take 5 mg by mouth in the morning and at bedtime.) 180 tablet 3   aspirin EC 81 MG tablet Take 81 mg daily by mouth.     atorvastatin (LIPITOR) 20 MG tablet TAKE 1 TABLET ONCE DAILY. (Patient taking differently: Take 20 mg by mouth at bedtime.) 90 tablet 3   diphenhydramine-acetaminophen (TYLENOL PM) 25-500 MG TABS tablet Take 1 tablet by mouth at bedtime as needed (for sleep).     fluticasone (FLONASE) 50 MCG/ACT nasal spray USE TWO SPRAYS IN EACH NOSTRIL ONCE DAILY (Patient taking differently: Place 2 sprays into both nostrils at bedtime.) 48 g 1   hydrocortisone 2.5 % cream Apply topically as needed.     lisinopril (ZESTRIL) 40 MG tablet TAKE ONE TABLET BY MOUTH DAILY (Patient taking differently: Take 40 mg by mouth daily.) 90 tablet 1   Multiple Vitamin (MULTIVITAMIN) tablet Take 1 tablet by mouth daily.     omeprazole (PRILOSEC) 20 MG capsule Take 1 capsule (20 mg total) by mouth daily. (Patient taking differently: Take 20 mg by mouth daily.) 90 capsule 1    Results for orders placed or performed during the hospital encounter of 12/14/21 (from the past 48 hour(s))  I-STAT, chem 8     Status: None   Collection Time: 12/14/21 12:47 PM  Result Value Ref Range   Sodium 136 135 - 145 mmol/L   Potassium 4.0 3.5 - 5.1 mmol/L   Chloride 100 98 - 111 mmol/L   BUN  12 8 - 23 mg/dL   Creatinine, Ser 0.80 0.61 - 1.24 mg/dL   Glucose, Bld 95 70 - 99 mg/dL    Comment: Glucose reference range applies only to samples taken after fasting for at least 8 hours.   Calcium, Ion 1.26 1.15 - 1.40 mmol/L   TCO2 25 22 - 32 mmol/L   Hemoglobin 15.6 13.0 - 17.0 g/dL   HCT 46.0 39.0 - 52.0 %   No results found.  Review of Systems  Constitutional:  Negative for chills and fever.  Genitourinary:  Positive for penile swelling.  All other systems reviewed and are negative.  Blood pressure (!) 144/81, pulse 71, temperature 97.6 F (36.4 C), temperature source Oral, resp. rate 16, height '5\' 10"'$  (1.778 m), weight 84.6 kg, SpO2 98 %. Physical Exam Vitals reviewed.  Constitutional:      Comments: Very pleasant, here with wife.   HENT:     Head: Normocephalic.  Eyes:     Pupils: Pupils are equal, round, and reactive to light.  Abdominal:     General: Abdomen is flat.  Genitourinary:    Comments: Stable dorsal shat rolled erythema with subtle verrucous changes.  Musculoskeletal:        General: Normal range of motion.  Skin:    General: Skin is warm.  Neurological:     General: No focal deficit present.     Mental Status: He is alert.  Psychiatric:        Mood and Affect: Mood normal.      Assessment/Plan  Proceed as planned with local excision of penile lesion most c/w local recurrent of penile cancer. Risks, benefits, alternatives, expected peir-op coures discussed previously and reiterated today.   Alexis Frock, MD 12/14/2021, 2:01 PM

## 2021-12-14 NOTE — Brief Op Note (Signed)
12/14/2021  3:11 PM  PATIENT:  Oscar Smith  76 y.o. male  PRE-OPERATIVE DIAGNOSIS:  RECURRENT PENILE CANCER  POST-OPERATIVE DIAGNOSIS:  RECURRENT PENILE CANCER  PROCEDURE:  Procedure(s) with comments: LOCAL EXCISION OF PENILE CANCER WITH PENILE BLOCK (N/A) - 1 HR  SURGEON:  Surgeon(s) and Role:    * Alexis Frock, MD - Primary  PHYSICIAN ASSISTANT:   ASSISTANTS: none   ANESTHESIA:   local and general  EBL:  minimal   BLOOD ADMINISTERED:none  DRAINS: none   LOCAL MEDICATIONS USED:  MARCAINE    and LIDOCAINE   SPECIMEN:  Source of Specimen:  1 - penile skin, 2 - deep dorsal margin  DISPOSITION OF SPECIMEN:  PATHOLOGY  COUNTS:  YES  TOURNIQUET:  * No tourniquets in log *  DICTATION: .Other Dictation: Dictation Number [id number]  PLAN OF CARE: Discharge to home after PACU  PATIENT DISPOSITION:  PACU - hemodynamically stable.   Delay start of Pharmacological VTE agent (>24hrs) due to surgical blood loss or risk of bleeding: yes

## 2021-12-14 NOTE — Anesthesia Procedure Notes (Signed)
Procedure Name: LMA Insertion Date/Time: 12/14/2021 2:22 PM  Performed by: Georgeanne Nim, CRNAPre-anesthesia Checklist: Patient identified, Emergency Drugs available, Suction available, Patient being monitored and Timeout performed Patient Re-evaluated:Patient Re-evaluated prior to induction Oxygen Delivery Method: Circle system utilized Preoxygenation: Pre-oxygenation with 100% oxygen Induction Type: IV induction Ventilation: Mask ventilation without difficulty LMA: LMA inserted LMA Size: 4.0 Number of attempts: 1 Placement Confirmation: positive ETCO2, CO2 detector and breath sounds checked- equal and bilateral Tube secured with: Tape Dental Injury: Teeth and Oropharynx as per pre-operative assessment

## 2021-12-14 NOTE — Transfer of Care (Signed)
Immediate Anesthesia Transfer of Care Note  Patient: Oscar Smith  Procedure(s) Performed: LOCAL EXCISION OF PENILE CANCER WITH PENILE BLOCK (Penis)  Patient Location: PACU  Anesthesia Type:General  Level of Consciousness: awake, alert , and patient cooperative  Airway & Oxygen Therapy: Patient Spontanous Breathing and Patient connected to nasal cannula oxygen  Post-op Assessment: Report given to RN and Post -op Vital signs reviewed and stable  Post vital signs: Reviewed and stable  Last Vitals:  Vitals Value Taken Time  BP    Temp    Pulse 71 12/14/21 1520  Resp 9 12/14/21 1520  SpO2 100 % 12/14/21 1520  Vitals shown include unvalidated device data.  Last Pain:  Vitals:   12/14/21 1232  TempSrc: Oral  PainSc: 0-No pain      Patients Stated Pain Goal: 4 (42/37/02 3017)  Complications: No notable events documented.

## 2021-12-14 NOTE — Anesthesia Postprocedure Evaluation (Signed)
Anesthesia Post Note  Patient: Oscar Smith  Procedure(s) Performed: LOCAL EXCISION OF PENILE CANCER WITH PENILE BLOCK (Penis)     Patient location during evaluation: PACU Anesthesia Type: General Level of consciousness: awake and alert Pain management: pain level controlled Vital Signs Assessment: post-procedure vital signs reviewed and stable Respiratory status: spontaneous breathing, nonlabored ventilation, respiratory function stable and patient connected to nasal cannula oxygen Cardiovascular status: blood pressure returned to baseline and stable Postop Assessment: no apparent nausea or vomiting Anesthetic complications: no   No notable events documented.  Last Vitals:  Vitals:   12/14/21 1530 12/14/21 1545  BP: (!) 140/78   Pulse: 72 66  Resp: 20 10  Temp:  (!) 36.4 C  SpO2: 95% 99%    Last Pain:  Vitals:   12/14/21 1530  TempSrc:   PainSc: 0-No pain                 Effie Berkshire

## 2021-12-14 NOTE — Discharge Instructions (Addendum)
1 - Removed dressing tomorrow morning at home. OK to shower and resume all activities after dressing removed other than no sexual activity x 2 weeks. All stitches are dissolvable and will disappear in about 2-3 weeks.   2 - Call MD or go to ER for fever >102, severe pain / nausea / vomiting not relieved by medications, or acute change in medical status   Post Anesthesia Home Care Instructions  Activity: Get plenty of rest for the remainder of the day. A responsible individual must stay with you for 24 hours following the procedure.  For the next 24 hours, DO NOT: -Drive a car -Paediatric nurse -Drink alcoholic beverages -Take any medication unless instructed by your physician -Make any legal decisions or sign important papers.  Meals: Start with liquid foods such as gelatin or soup. Progress to regular foods as tolerated. Avoid greasy, spicy, heavy foods. If nausea and/or vomiting occur, drink only clear liquids until the nausea and/or vomiting subsides. Call your physician if vomiting continues.  Special Instructions/Symptoms: Your throat may feel dry or sore from the anesthesia or the breathing tube placed in your throat during surgery. If this causes discomfort, gargle with warm salt water. The discomfort should disappear within 24 hours.

## 2021-12-14 NOTE — Op Note (Signed)
NAME: Irie, Fiorello [facility] MEDICAL RECORD NO: [id number] ACCOUNT NO: [id number] DATE OF BIRTH: September 06, 1945 FACILITY: [place] LOCATION: [facility] PHYSICIAN: Alexis Frock, MD  Operative Report   DATE OF PROCEDURE: 12/14/2021  PREOPERATIVE DIAGNOSES:  Penile lesion, history of penile cancer.  PROCEDURE:  1.  Local excision of penile cancer. 2.  Penile block.  ESTIMATED BLOOD LOSS:  Nil.  COMPLICATIONS:  None.  SPECIMENS:   1.  Penile skin for permanent pathology. 2.  History of penile cancer. 3.  Deep proximal margin for permanent pathology.  FINDINGS:   1.  Erythematous verrucous changes concerning for local recurrence of penile cancer, mostly dorsal. 2.  Resolution of all visible varicose tissue following local excision.  INDICATIONS:  The patient is a pleasant 76 year old man with history of low-grade carcinoma of the penis found at time of circumcision years ago.  Given the low-grade neoplasm, he has been on a path of surveillance with periodic exams and groin exams and  he is quite compliant with this.  He noticed a new area of verrucous erythematous changes on the left dorsal aspect of the penis concerning for local recurrence on exam in the office recently and I agree that this was concerning. His groin exam remains  benign.  Options were discussed including recommended path of repeat local excision, especially given his history of low-grade disease and his advanced age and he wished to proceed.  Informed consent was obtained and placed in medical record.  PROCEDURE IN DETAIL:  The patient being himself verified and procedure being local penile skin excision and penile block was confirmed.  Procedure timeout was performed.  Intravenous antibiotics were administered.  General LMA anesthesia induced.  The  patient was placed in a supine position.  Sterile field was created, prepped and draped the patient's penis, perineum, and proximal thighs using iodine.  Under exam in the OR,  it was noted that there were subtle verrucous changes that were more extensive  than the previously area noted in the office.  He had approximately 2 cm x 1 cm area of erythematous verrucous changes in the dorsal aspect, but very subtly, had some subtle verrucous changes extending along nearly 3/4 circumference of his previous  collar from his prior circumcision. As the goal today was maximal local incision, decision was made to essentially perform a complete collar excision of skin circumferentially approximately 1 cm width from the corona of the glans proximal and this would  encompass all areas of visible gross concern.  Attention was then directed to penile block, 10 mL of a slurry of 50% lidocaine and Marcaine were instilled along the course of the dorsal penile nerve directly inferior to the pubic ramus with additional 10  mL in a ring block fashion.  Next, the proximal and distal collars were developed circumferentially as per previous noted area.  Dissection was carried down to the area of the tunica and the skin was separated from this using cautery dissection.  This  collar of skin was then delivered. In the dorsal aspect of this, there was more desmoplastic changes dorsally and a separate deep dorsal margin was coldly excised using Metz scissors and point cautery was used to obtain excellent hemostasis.  There was  no evidence of any urethral violation.  Unfortunately, as he had plenty of redundant skin, cosmesis would appear quite acceptable which is a similar reapproximation of the skin. As such, a 3-0 Vicryl was used at the 6 o'clock, 12 o'clock positions  respectively in interrupted fashion  for loose reapproximation. Intervening 2 skin lines were reapproximated using running Vicryl resulting in excellent cosmesis and hemostasis.  A dressing of Xeroform followed by Doreene Nest and Coban was applied loosely and  the procedure was terminated.  The patient tolerated procedure well, no immediate  perioperative complications.  The patient was taken to [facility] in stable condition with plan for discharge home today.       PAA D: 12/14/2021 3:17:31 pm T: 12/14/2021 10:20:00 pm  JOB: [id number]/ [id number]

## 2021-12-19 ENCOUNTER — Encounter (HOSPITAL_BASED_OUTPATIENT_CLINIC_OR_DEPARTMENT_OTHER): Payer: Self-pay | Admitting: Urology

## 2021-12-19 LAB — SURGICAL PATHOLOGY

## 2022-01-02 DIAGNOSIS — R3912 Poor urinary stream: Secondary | ICD-10-CM | POA: Diagnosis not present

## 2022-01-11 DIAGNOSIS — L218 Other seborrheic dermatitis: Secondary | ICD-10-CM | POA: Diagnosis not present

## 2022-01-11 DIAGNOSIS — L820 Inflamed seborrheic keratosis: Secondary | ICD-10-CM | POA: Diagnosis not present

## 2022-01-11 DIAGNOSIS — D1801 Hemangioma of skin and subcutaneous tissue: Secondary | ICD-10-CM | POA: Diagnosis not present

## 2022-01-11 DIAGNOSIS — L821 Other seborrheic keratosis: Secondary | ICD-10-CM | POA: Diagnosis not present

## 2022-01-11 DIAGNOSIS — Z85828 Personal history of other malignant neoplasm of skin: Secondary | ICD-10-CM | POA: Diagnosis not present

## 2022-01-11 DIAGNOSIS — D485 Neoplasm of uncertain behavior of skin: Secondary | ICD-10-CM | POA: Diagnosis not present

## 2022-01-11 DIAGNOSIS — L812 Freckles: Secondary | ICD-10-CM | POA: Diagnosis not present

## 2022-01-11 DIAGNOSIS — D225 Melanocytic nevi of trunk: Secondary | ICD-10-CM | POA: Diagnosis not present

## 2022-01-20 ENCOUNTER — Encounter: Payer: Self-pay | Admitting: Emergency Medicine

## 2022-01-20 DIAGNOSIS — H903 Sensorineural hearing loss, bilateral: Secondary | ICD-10-CM

## 2022-01-20 DIAGNOSIS — J329 Chronic sinusitis, unspecified: Secondary | ICD-10-CM

## 2022-01-22 ENCOUNTER — Other Ambulatory Visit: Payer: Self-pay | Admitting: Cardiology

## 2022-01-27 ENCOUNTER — Encounter: Payer: Self-pay | Admitting: *Deleted

## 2022-01-27 NOTE — Telephone Encounter (Signed)
If pt already has an appt, he shouldn't need a referral. I will fax over the referral just in case and go ahead and close it stating that he has an appt.

## 2022-02-02 ENCOUNTER — Other Ambulatory Visit: Payer: Self-pay | Admitting: Emergency Medicine

## 2022-02-16 DIAGNOSIS — D485 Neoplasm of uncertain behavior of skin: Secondary | ICD-10-CM | POA: Diagnosis not present

## 2022-02-16 DIAGNOSIS — L988 Other specified disorders of the skin and subcutaneous tissue: Secondary | ICD-10-CM | POA: Diagnosis not present

## 2022-02-20 ENCOUNTER — Other Ambulatory Visit: Payer: Self-pay | Admitting: Emergency Medicine

## 2022-02-21 DIAGNOSIS — Z974 Presence of external hearing-aid: Secondary | ICD-10-CM | POA: Diagnosis not present

## 2022-02-21 DIAGNOSIS — J342 Deviated nasal septum: Secondary | ICD-10-CM | POA: Diagnosis not present

## 2022-02-21 DIAGNOSIS — H6123 Impacted cerumen, bilateral: Secondary | ICD-10-CM | POA: Diagnosis not present

## 2022-02-21 DIAGNOSIS — H93293 Other abnormal auditory perceptions, bilateral: Secondary | ICD-10-CM | POA: Diagnosis not present

## 2022-02-28 DIAGNOSIS — R3912 Poor urinary stream: Secondary | ICD-10-CM | POA: Diagnosis not present

## 2022-03-14 DIAGNOSIS — H5203 Hypermetropia, bilateral: Secondary | ICD-10-CM | POA: Diagnosis not present

## 2022-03-14 DIAGNOSIS — H43813 Vitreous degeneration, bilateral: Secondary | ICD-10-CM | POA: Diagnosis not present

## 2022-03-14 DIAGNOSIS — H25813 Combined forms of age-related cataract, bilateral: Secondary | ICD-10-CM | POA: Diagnosis not present

## 2022-03-24 DIAGNOSIS — H903 Sensorineural hearing loss, bilateral: Secondary | ICD-10-CM | POA: Diagnosis not present

## 2022-03-28 ENCOUNTER — Encounter: Payer: Self-pay | Admitting: Emergency Medicine

## 2022-03-28 ENCOUNTER — Ambulatory Visit (INDEPENDENT_AMBULATORY_CARE_PROVIDER_SITE_OTHER): Payer: Medicare Other | Admitting: Emergency Medicine

## 2022-03-28 VITALS — BP 118/86 | HR 70 | Temp 98.6°F | Ht 70.0 in | Wt 189.5 lb

## 2022-03-28 DIAGNOSIS — I10 Essential (primary) hypertension: Secondary | ICD-10-CM | POA: Diagnosis not present

## 2022-03-28 DIAGNOSIS — K219 Gastro-esophageal reflux disease without esophagitis: Secondary | ICD-10-CM | POA: Diagnosis not present

## 2022-03-28 DIAGNOSIS — E785 Hyperlipidemia, unspecified: Secondary | ICD-10-CM

## 2022-03-28 LAB — CBC WITH DIFFERENTIAL/PLATELET
Basophils Absolute: 0 10*3/uL (ref 0.0–0.1)
Basophils Relative: 0.6 % (ref 0.0–3.0)
Eosinophils Absolute: 0.1 10*3/uL (ref 0.0–0.7)
Eosinophils Relative: 2.2 % (ref 0.0–5.0)
HCT: 42.4 % (ref 39.0–52.0)
Hemoglobin: 14.8 g/dL (ref 13.0–17.0)
Lymphocytes Relative: 33.9 % (ref 12.0–46.0)
Lymphs Abs: 1.8 10*3/uL (ref 0.7–4.0)
MCHC: 34.9 g/dL (ref 30.0–36.0)
MCV: 90.6 fl (ref 78.0–100.0)
Monocytes Absolute: 0.6 10*3/uL (ref 0.1–1.0)
Monocytes Relative: 10.8 % (ref 3.0–12.0)
Neutro Abs: 2.8 10*3/uL (ref 1.4–7.7)
Neutrophils Relative %: 52.5 % (ref 43.0–77.0)
Platelets: 203 10*3/uL (ref 150.0–400.0)
RBC: 4.68 Mil/uL (ref 4.22–5.81)
RDW: 13.1 % (ref 11.5–15.5)
WBC: 5.3 10*3/uL (ref 4.0–10.5)

## 2022-03-28 LAB — COMPREHENSIVE METABOLIC PANEL
ALT: 23 U/L (ref 0–53)
AST: 21 U/L (ref 0–37)
Albumin: 4.2 g/dL (ref 3.5–5.2)
Alkaline Phosphatase: 84 U/L (ref 39–117)
BUN: 12 mg/dL (ref 6–23)
CO2: 30 mEq/L (ref 19–32)
Calcium: 9.7 mg/dL (ref 8.4–10.5)
Chloride: 101 mEq/L (ref 96–112)
Creatinine, Ser: 0.94 mg/dL (ref 0.40–1.50)
GFR: 77.52 mL/min (ref >60.00)
Glucose, Bld: 82 mg/dL (ref 70–99)
Potassium: 4.2 mEq/L (ref 3.5–5.1)
Sodium: 137 mEq/L (ref 135–145)
Total Bilirubin: 0.8 mg/dL (ref 0.2–1.2)
Total Protein: 6.9 g/dL (ref 6.0–8.3)

## 2022-03-28 LAB — LIPID PANEL
Cholesterol: 137 mg/dL (ref 0–200)
HDL: 44.2 mg/dL (ref >39.00)
LDL Cholesterol: 62 mg/dL (ref 0–99)
NonHDL: 93.2
Total CHOL/HDL Ratio: 3
Triglycerides: 156 mg/dL — ABNORMAL HIGH (ref 0.0–149.0)
VLDL: 31.2 mg/dL (ref 0.0–40.0)

## 2022-03-28 LAB — HEMOGLOBIN A1C: Hgb A1c MFr Bld: 5.2 % (ref 4.6–6.5)

## 2022-03-28 NOTE — Assessment & Plan Note (Signed)
Asymptomatic.  Well-controlled. Still taking omeprazole 20 mg daily.

## 2022-03-28 NOTE — Assessment & Plan Note (Signed)
Chronic stable condition. Lipid profile done today. Elevated triglycerides in the past Continue atorvastatin 20 mg daily. The 10-year ASCVD risk score (Arnett DK, et al., 2019) is: 28.9%   Values used to calculate the score:     Age: 77 years     Sex: Male     Is Non-Hispanic African American: No     Diabetic: No     Tobacco smoker: No     Systolic Blood Pressure: 123456 mmHg     Is BP treated: Yes     HDL Cholesterol: 39.4 mg/dL     Total Cholesterol: 133 mg/dL

## 2022-03-28 NOTE — Progress Notes (Signed)
Oscar Smith 77 y.o.   Chief Complaint  Patient presents with   Follow-up    71mth f/u appt, patient has several concerns, patient wants to know if he needs a Dexa scan     HISTORY OF PRESENT ILLNESS: This is a 77y.o. male here for 64-monthollow-up of chronic medical conditions. Doing well. Has history of hypertension on lisinopril and amlodipine with normal blood pressure readings at home.  Would like to decrease dose on one of them Has history of intermittent pain to right hip area.  No pain now but inquiring about corticosteroid injection.  Not really needed. Inquiring if he needs DEXA scan or not No other complaints or medical concerns today Eating well and exercising regularly.  Non-smoker.  HPI   Prior to Admission medications   Medication Sig Start Date End Date Taking? Authorizing Provider  amLODipine (NORVASC) 5 MG tablet TAKE 1 TABLET BY MOUTH TWICE DAILY. 01/24/22   ScDonato HeinzMD  aspirin EC 81 MG tablet Take 81 mg daily by mouth.    [provider]  atorvastatin (LIPITOR) 20 MG tablet TAKE 1 TABLET ONCE DAILY. Patient taking differently: Take 20 mg by mouth at bedtime. 03/28/21   ScDonato HeinzMD  diphenhydramine-acetaminophen (TYLENOL PM) 25-500 MG TABS tablet Take 1 tablet by mouth at bedtime as needed (for sleep).    [provider]  docusate sodium (COLACE) 100 MG capsule Take 1 capsule (100 mg total) by mouth 2 (two) times daily. 12/14/21   Emmerling, MiLegrand ComoMD  fluticasone (FLONASE) 50 MCG/ACT nasal spray USE TWO SPRAYS IN EACH NOSTRIL ONCE DAILY Patient taking differently: Place 2 sprays into both nostrils at bedtime. 12/04/21   SaHorald PollenMD  hydrocortisone 2.5 % cream Apply topically as needed.    [provider]  lisinopril (ZESTRIL) 40 MG tablet TAKE ONE TABLET BY MOUTH DAILY 02/20/22   SaHorald PollenMD  Multiple Vitamin (MULTIVITAMIN) tablet Take 1 tablet by mouth daily.     [provider]  omeprazole (PRILOSEC) 20 MG capsule TAKE ONE CAPSULE BY MOUTH DAILY 02/02/22   SaHorald PollenMD  traMADol (ULTRAM) 50 MG tablet Take 1-2 tablets (50-100 mg total) by mouth every 6 (six) hours as needed. For post-op pain 12/14/21 12/14/22  MaAlexis FrockMD    Allergies  Allergen Reactions   Flomax [Tamsulosin Hcl] Other (See Comments)    "bad reaction" = jaw pain   Latex Itching and Rash    Patient Active Problem List   Diagnosis Date Noted   Benign neoplasm of descending colon    Benign neoplasm of transverse colon    Situational depression 03/21/2021   Dyslipidemia 09/17/2019   History of diverticulosis 09/17/2019   Chronic sinus complaints 09/17/2019   Essential hypertension 07/03/2018   Pure hypercholesterolemia 07/03/2018   Osteoarthritis of right hip 02/07/2018   Lumbar radiculopathy 01/24/2017   Enlarged prostate with urinary retention 12/25/2013   Erectile dysfunction 12/25/2013   Incomplete bladder emptying 08/27/2013   Chronic sinusitis 02/20/2012   Frontal mucocele 02/20/2012   Nasal septal deviation 02/20/2012   ADENOCARCINOMA, PROSTATE, GLEASON GRADE 6 12/21/2009   GERD 11/12/2008    Past Medical History:  Diagnosis Date   BPH with obstruction/lower urinary tract symptoms    urologist--- dr maTresa Moore CAD (coronary artery disease)    cardiologist--- dr c. scGardiner Rhyme per  note native artery;   nuclear stress test 12-13-2020  low risk w/ fixed perfusion defect  apex w/ hypokinesis consistant w/ prior infart,  normal wall motion,  nuclear ef 54%;   coronary CT 01-05-2020  score= 767   ED (erectile dysfunction) of organic origin    First degree heart block    GERD (gastroesophageal reflux disease)    Hiatal hernia    History of colon polyps    2002 and 2003 hyperplastic   History of condyloma acuminatum    penis   History of diverticulitis of colon    History of gastritis    Hypertension    Left bundle branch block     Malignant neoplasm prostate [facility]) 11/2009   urologist--- dr Tresa Moore;   dx 11/ 2011,  gleason 3+3,  completed IMRT 03/ 2012   Penile cancer [facility])    urologist--- dr Tresa Moore ;    recurrent 11/ 2023   Wears glasses    Wears hearing aid in both ears     Past Surgical History:  Procedure Laterality Date   BIOPSY  08/04/2021   Procedure: BIOPSY;  Surgeon: Ladene Artist, MD;  Location: [facility];  Service: Gastroenterology;;   [place] Meek N/A 06/09/2015   Procedure: CIRCUMCISION ADULT WITH PENILE BLOCK;  Surgeon: Alexis Frock, MD;  Location: [facility];  Service: Urology;  Laterality: N/A;   COLONOSCOPY WITH PROPOFOL N/A 08/04/2021   Procedure: COLONOSCOPY WITH PROPOFOL;  Surgeon: Ladene Artist, MD;  Location: [facility];  Service: Gastroenterology;  Laterality: N/A;   CONDYLOMA EXCISION/FULGURATION N/A 06/09/2020   Procedure: EXCISION OF PENILE WARTS;  Surgeon: Alexis Frock, MD;  Location: [facility];  Service: Urology;  Laterality: N/A;  43 MINS   ENDOVENOUS ABLATION SAPHENOUS VEIN W/ LASER Left 02/10/2016   endovenous laser ablation left greater saphenous vein and stab phlebectomy left leg by Curt Jews MD   INGUINAL HERNIA REPAIR Right 12/23/2009   '@[facility]'$  by dr Margot Chimes   LIPOMA EXCISION N/A 12/01/2016   Procedure: EXCISION POSTERIOR NECK MASS;  Surgeon: Irene Limbo, MD;  Location: [facility];  Service: Plastics;  Laterality: N/A;   PENILE BIOPSY N/A 12/14/2021   Procedure: LOCAL EXCISION OF PENILE CANCER WITH PENILE BLOCK;  Surgeon: Alexis Frock, MD;  Location: [facility];  Service: Urology;  Laterality: N/A;  1 HR   POLYPECTOMY  08/04/2021   Procedure: POLYPECTOMY;  Surgeon: Ladene Artist, MD;  Location: [facility];  Service: Gastroenterology;;   SHOULDER ARTHROSCOPY WITH SUBACROMIAL DECOMPRESSION, ROTATOR CUFF REPAIR AND BICEP TENDON REPAIR Left 10/2014    Social History   Socioeconomic History    Marital status: Married    Spouse name: Not on file   Number of children: 0   Years of education: BS   Highest education level: Not on file  Occupational History   Occupation: SCULPTOR   Occupation: Chief Financial Officer  Tobacco Use   Smoking status: Former    Packs/day: 0.50    Years: 2.00    Total pack years: 1.00    Types: Cigarettes    Quit date: 09/04/1965    Years since quitting: 56.6   Smokeless tobacco: Never   Tobacco comments:    quit around 77 yrs old.  Vaping Use   Vaping Use: Never used  Substance and Sexual Activity   Alcohol use: Yes    Alcohol/week: 20.0 standard drinks of alcohol    Types: 20 Standard drinks or equivalent per week    Comment: drink 1-2 at night    Drug use: Never   Sexual activity: Yes  Partners: Female  Other Topics Concern   Not on file  Social History Narrative   ** Merged History Encounter **       Originally from [place]   Trained as an Chief Financial Officer.    Married '75- 5 years, divorced; Married '86. No children.    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 Sculptor-life size figures    by commission (see work in [place]  Sculpting work regularly.   Never smoking.   Alcohol: drinks1- 2 beers and 1-2 wines every night;    Learning the piano. Patient get regular exercise.   Wife is a Marine scientist.   Caffeine: 3/day    ADLs: drives; independent with ADLs   Advanced Directives: YES; FULL CODE; no prolonged measures.        Social Determinants of Health   Financial Resource Strain: Low Risk  (07/08/2021)   Overall Financial Resource Strain (CARDIA)    Difficulty of Paying Living Expenses: Not hard at all  Food Insecurity: No Food Insecurity (07/08/2021)   Hunger Vital Sign    Worried About Running Out of Food in the Last Year: Never true    Ran Out of Food in the Last Year: Never true  Transportation Needs: No Transportation Needs (07/08/2021)   PRAPARE - Armed forces logistics/support/administrative officer (Medical): No    Lack of Transportation (Non-Medical): No  Physical Activity: Insufficiently Active (07/08/2021)   Exercise Vital Sign    Days of Exercise per Week: 3 days    Minutes of Exercise per Session: 30 min  Stress: No Stress Concern Present (07/08/2021)   [place]    Feeling of Stress : Only a little  Social Connections: Moderately Isolated (07/08/2021)   Social Connection and Isolation Panel [NHANES]    Frequency of Communication with Friends and Family: Twice a week    Frequency of Social Gatherings with Friends and Family: Twice a week    Attends Religious Services: Never    Marine scientist or Organizations: No    Attends Archivist Meetings: Never    Marital Status: Married  Human resources officer Violence: Not At Risk (07/08/2021)   Humiliation, Afraid, Rape, and Kick questionnaire    Fear of Current or Ex-Partner: No    Emotionally Abused: No    Physically Abused: No    Sexually Abused: No    Family History  Problem Relation Age of Onset   Cancer Father 69       stomach versus colon cancer   Pancreatic cancer Sister 52   Cancer Brother        penile cancer   Esophageal cancer Neg Hx    Throat cancer Neg Hx      Review of Systems  Constitutional: Negative.  Negative for chills and fever.  HENT: Negative.  Negative for congestion and sore throat.   Respiratory: Negative.  Negative for cough and shortness of breath.   Cardiovascular: Negative.  Negative for chest pain and palpitations.  Gastrointestinal:  Negative for abdominal pain, diarrhea, nausea and vomiting.  Skin: Negative.  Negative for rash.  Neurological: Negative.  Negative for dizziness and headaches.  All other systems reviewed and are negative.  Today's Vitals   03/28/22 0759  BP: 118/86  Pulse: 70  Temp: 98.6 F (37 C)  TempSrc: Oral  SpO2: 97%  Weight: 189 lb 8 oz (86 kg)  Height:  5'  10" (1.778 m)   Body mass index is 27.19 kg/m.   Physical Exam Vitals reviewed.  Constitutional:      Appearance: Normal appearance.  HENT:     Head: Normocephalic.     Right Ear: Tympanic membrane, ear canal and external ear normal.     Left Ear: Tympanic membrane, ear canal and external ear normal.     Mouth/Throat:     Mouth: Mucous membranes are moist.     Pharynx: Oropharynx is clear.  Eyes:     Extraocular Movements: Extraocular movements intact.     Pupils: Pupils are equal, round, and reactive to light.  Cardiovascular:     Rate and Rhythm: Normal rate and regular rhythm.     Pulses: Normal pulses.     Heart sounds: Normal heart sounds.  Pulmonary:     Effort: Pulmonary effort is normal.     Breath sounds: Normal breath sounds.  Musculoskeletal:     Right lower leg: No edema.     Left lower leg: No edema.  Skin:    General: Skin is warm and dry.     Capillary Refill: Capillary refill takes less than 2 seconds.  Neurological:     General: No focal deficit present.     Mental Status: He is alert and oriented to person, place, and time.  Psychiatric:        Mood and Affect: Mood normal.        Behavior: Behavior normal.      ASSESSMENT & PLAN: A total of 45 minutes was spent with the patient and counseling/coordination of care regarding preparing for this visit, review of most recent office visit notes, review of most recent blood work results, review of multiple chronic medical conditions and their management, review of all medications and changes made, cardiovascular risks associated with hypertension, diet and nutrition, review of health maintenance items, prognosis, documentation and need for follow-up in 6 months.  Problem List Items Addressed This Visit       Cardiovascular and Mediastinum   Essential hypertension - Primary    Well-controlled hypertension. Cardiovascular risk associated with hypertension discussed. Recommend to continue amlodipine 5 mg  daily May decrease dose of lisinopril to 20 mg daily. Diet and nutrition discussed.      Relevant Orders   CBC with Differential/Platelet   Comprehensive metabolic panel   Hemoglobin A1c   Lipid panel     Digestive   GERD    Asymptomatic.  Well-controlled. Still taking omeprazole 20 mg daily.        Other   Dyslipidemia    Chronic stable condition. Lipid profile done today. Elevated triglycerides in the past Continue atorvastatin 20 mg daily. The 10-year ASCVD risk score (Arnett DK, et al., 2019) is: 28.9%   Values used to calculate the score:     Age: 53 years     Sex: Male     Is Non-Hispanic African American: No     Diabetic: No     Tobacco smoker: No     Systolic Blood Pressure: 123456 mmHg     Is BP treated: Yes     HDL Cholesterol: 39.4 mg/dL     Total Cholesterol: 133 mg/dL       Relevant Orders   Comprehensive metabolic panel   Hemoglobin A1c   Lipid panel   Patient Instructions  Continue amlodipine 5 mg daily Decrease lisinopril dose to 20 mg daily. Monitor blood pressure readings at home daily for the  next several weeks and keep a log.  Contact the office if numbers persistently abnormal. Follow-up in 6 months.  Hypertension, Adult High blood pressure (hypertension) is when the force of blood pumping through the arteries is too strong. The arteries are the blood vessels that carry blood from the heart throughout the body. Hypertension forces the heart to work harder to pump blood and may cause arteries to become narrow or stiff. Untreated or uncontrolled hypertension can lead to a heart attack, heart failure, a stroke, kidney disease, and other problems. A blood pressure reading consists of a higher number over a lower number. Ideally, your blood pressure should be below 120/80. The first ("top") number is called the systolic pressure. It is a measure of the pressure in your arteries as your heart beats. The second ("bottom") number is called the diastolic  pressure. It is a measure of the pressure in your arteries as the heart relaxes. What are the causes? The exact cause of this condition is not known. There are some conditions that result in high blood pressure. What increases the risk? Certain factors may make you more likely to develop high blood pressure. Some of these risk factors are under your control, including: Smoking. Not getting enough exercise or physical activity. Being overweight. Having too much fat, sugar, calories, or salt (sodium) in your diet. Drinking too much alcohol. Other risk factors include: Having a personal history of heart disease, diabetes, high cholesterol, or kidney disease. Stress. Having a family history of high blood pressure and high cholesterol. Having obstructive sleep apnea. Age. The risk increases with age. What are the signs or symptoms? High blood pressure may not cause symptoms. Very high blood pressure (hypertensive crisis) may cause: Headache. Fast or irregular heartbeats (palpitations). Shortness of breath. Nosebleed. Nausea and vomiting. Vision changes. Severe chest pain, dizziness, and seizures. How is this diagnosed? This condition is diagnosed by measuring your blood pressure while you are seated, with your arm resting on a flat surface, your legs uncrossed, and your feet flat on the floor. The cuff of the blood pressure monitor will be placed directly against the skin of your upper arm at the level of your heart. Blood pressure should be measured at least twice using the same arm. Certain conditions can cause a difference in blood pressure between your right and left arms. If you have a high blood pressure reading during one visit or you have normal blood pressure with other risk factors, you may be asked to: Return on a different day to have your blood pressure checked again. Monitor your blood pressure at home for 1 week or longer. If you are diagnosed with hypertension, you may have  other blood or imaging tests to help your health care provider understand your overall risk for other conditions. How is this treated? This condition is treated by making healthy lifestyle changes, such as eating healthy foods, exercising more, and reducing your alcohol intake. You may be referred for counseling on a healthy diet and physical activity. Your health care provider may prescribe medicine if lifestyle changes are not enough to get your blood pressure under control and if: Your systolic blood pressure is above 130. Your diastolic blood pressure is above 80. Your personal target blood pressure may vary depending on your medical conditions, your age, and other factors. Follow these instructions at home: Eating and drinking  Eat a diet that is high in fiber and potassium, and low in sodium, added sugar, and fat. An example of  this eating plan is called the DASH diet. DASH stands for Dietary Approaches to Stop Hypertension. To eat this way: Eat plenty of fresh fruits and vegetables. Try to fill one half of your plate at each meal with fruits and vegetables. Eat whole grains, such as whole-wheat pasta, brown rice, or whole-grain bread. Fill about one fourth of your plate with whole grains. Eat or drink low-fat dairy products, such as skim milk or low-fat yogurt. Avoid fatty cuts of meat, processed or cured meats, and poultry with skin. Fill about one fourth of your plate with lean proteins, such as fish, chicken without skin, beans, eggs, or tofu. Avoid pre-made and processed foods. These tend to be higher in sodium, added sugar, and fat. Reduce your daily sodium intake. Many people with hypertension should eat less than 1,500 mg of sodium a day. Do not drink alcohol if: Your health care provider tells you not to drink. You are pregnant, may be pregnant, or are planning to become pregnant. If you drink alcohol: Limit how much you have to: 0-1 drink a day for women. 0-2 drinks a day for  men. Know how much alcohol is in your drink. In the [place] one drink equals one 12 oz bottle of beer (355 mL), one 5 oz glass of wine (148 mL), or one 1 oz glass of hard liquor (44 mL). Lifestyle  Work with your health care provider to maintain a healthy body weight or to lose weight. Ask what an ideal weight is for you. Get at least 30 minutes of exercise that causes your heart to beat faster (aerobic exercise) most days of the week. Activities may include walking, swimming, or biking. Include exercise to strengthen your muscles (resistance exercise), such as Pilates or lifting weights, as part of your weekly exercise routine. Try to do these types of exercises for 30 minutes at least 3 days a week. Do not use any products that contain nicotine or tobacco. These products include cigarettes, chewing tobacco, and vaping devices, such as e-cigarettes. If you need help quitting, ask your health care provider. Monitor your blood pressure at home as told by your health care provider. Keep all follow-up visits. This is important. Medicines Take over-the-counter and prescription medicines only as told by your health care provider. Follow directions carefully. Blood pressure medicines must be taken as prescribed. Do not skip doses of blood pressure medicine. Doing this puts you at risk for problems and can make the medicine less effective. Ask your health care provider about side effects or reactions to medicines that you should watch for. Contact a health care provider if you: Think you are having a reaction to a medicine you are taking. Have headaches that keep coming back (recurring). Feel dizzy. Have swelling in your ankles. Have trouble with your vision. Get help right away if you: Develop a severe headache or confusion. Have unusual weakness or numbness. Feel faint. Have severe pain in your chest or abdomen. Vomit repeatedly. Have trouble breathing. These symptoms may be an emergency. Get  help right away. Call 911. Do not wait to see if the symptoms will go away. Do not drive yourself to the hospital. Summary Hypertension is when the force of blood pumping through your arteries is too strong. If this condition is not controlled, it may put you at risk for serious complications. Your personal target blood pressure may vary depending on your medical conditions, your age, and other factors. For most people, a normal blood pressure is less than  120/80. Hypertension is treated with lifestyle changes, medicines, or a combination of both. Lifestyle changes include losing weight, eating a healthy, low-sodium diet, exercising more, and limiting alcohol. This information is not intended to replace advice given to you by your health care provider. Make sure you discuss any questions you have with your health care provider. Document Revised: 11/16/2020 Document Reviewed: 11/16/2020 Elsevier Patient Education  [place], MD [facility]

## 2022-03-28 NOTE — Assessment & Plan Note (Signed)
Well-controlled hypertension. Cardiovascular risk associated with hypertension discussed. Recommend to continue amlodipine 5 mg daily May decrease dose of lisinopril to 20 mg daily. Diet and nutrition discussed.

## 2022-03-28 NOTE — Patient Instructions (Signed)
Continue amlodipine 5 mg daily Decrease lisinopril dose to 20 mg daily. Monitor blood pressure readings at home daily for the next several weeks and keep a log.  Contact the office if numbers persistently abnormal. Follow-up in 6 months.  Hypertension, Adult High blood pressure (hypertension) is when the force of blood pumping through the arteries is too strong. The arteries are the blood vessels that carry blood from the heart throughout the body. Hypertension forces the heart to work harder to pump blood and may cause arteries to become narrow or stiff. Untreated or uncontrolled hypertension can lead to a heart attack, heart failure, a stroke, kidney disease, and other problems. A blood pressure reading consists of a higher number over a lower number. Ideally, your blood pressure should be below 120/80. The first ("top") number is called the systolic pressure. It is a measure of the pressure in your arteries as your heart beats. The second ("bottom") number is called the diastolic pressure. It is a measure of the pressure in your arteries as the heart relaxes. What are the causes? The exact cause of this condition is not known. There are some conditions that result in high blood pressure. What increases the risk? Certain factors may make you more likely to develop high blood pressure. Some of these risk factors are under your control, including: Smoking. Not getting enough exercise or physical activity. Being overweight. Having too much fat, sugar, calories, or salt (sodium) in your diet. Drinking too much alcohol. Other risk factors include: Having a personal history of heart disease, diabetes, high cholesterol, or kidney disease. Stress. Having a family history of high blood pressure and high cholesterol. Having obstructive sleep apnea. Age. The risk increases with age. What are the signs or symptoms? High blood pressure may not cause symptoms. Very high blood pressure (hypertensive  crisis) may cause: Headache. Fast or irregular heartbeats (palpitations). Shortness of breath. Nosebleed. Nausea and vomiting. Vision changes. Severe chest pain, dizziness, and seizures. How is this diagnosed? This condition is diagnosed by measuring your blood pressure while you are seated, with your arm resting on a flat surface, your legs uncrossed, and your feet flat on the floor. The cuff of the blood pressure monitor will be placed directly against the skin of your upper arm at the level of your heart. Blood pressure should be measured at least twice using the same arm. Certain conditions can cause a difference in blood pressure between your right and left arms. If you have a high blood pressure reading during one visit or you have normal blood pressure with other risk factors, you may be asked to: Return on a different day to have your blood pressure checked again. Monitor your blood pressure at home for 1 week or longer. If you are diagnosed with hypertension, you may have other blood or imaging tests to help your health care provider understand your overall risk for other conditions. How is this treated? This condition is treated by making healthy lifestyle changes, such as eating healthy foods, exercising more, and reducing your alcohol intake. You may be referred for counseling on a healthy diet and physical activity. Your health care provider may prescribe medicine if lifestyle changes are not enough to get your blood pressure under control and if: Your systolic blood pressure is above 130. Your diastolic blood pressure is above 80. Your personal target blood pressure may vary depending on your medical conditions, your age, and other factors. Follow these instructions at home: Eating and drinking  Eat  a diet that is high in fiber and potassium, and low in sodium, added sugar, and fat. An example of this eating plan is called the DASH diet. DASH stands for Dietary Approaches to Stop  Hypertension. To eat this way: Eat plenty of fresh fruits and vegetables. Try to fill one half of your plate at each meal with fruits and vegetables. Eat whole grains, such as whole-wheat pasta, brown rice, or whole-grain bread. Fill about one fourth of your plate with whole grains. Eat or drink low-fat dairy products, such as skim milk or low-fat yogurt. Avoid fatty cuts of meat, processed or cured meats, and poultry with skin. Fill about one fourth of your plate with lean proteins, such as fish, chicken without skin, beans, eggs, or tofu. Avoid pre-made and processed foods. These tend to be higher in sodium, added sugar, and fat. Reduce your daily sodium intake. Many people with hypertension should eat less than 1,500 mg of sodium a day. Do not drink alcohol if: Your health care provider tells you not to drink. You are pregnant, may be pregnant, or are planning to become pregnant. If you drink alcohol: Limit how much you have to: 0-1 drink a day for women. 0-2 drinks a day for men. Know how much alcohol is in your drink. In the [place] one drink equals one 12 oz bottle of beer (355 mL), one 5 oz glass of wine (148 mL), or one 1 oz glass of hard liquor (44 mL). Lifestyle  Work with your health care provider to maintain a healthy body weight or to lose weight. Ask what an ideal weight is for you. Get at least 30 minutes of exercise that causes your heart to beat faster (aerobic exercise) most days of the week. Activities may include walking, swimming, or biking. Include exercise to strengthen your muscles (resistance exercise), such as Pilates or lifting weights, as part of your weekly exercise routine. Try to do these types of exercises for 30 minutes at least 3 days a week. Do not use any products that contain nicotine or tobacco. These products include cigarettes, chewing tobacco, and vaping devices, such as e-cigarettes. If you need help quitting, ask your health care provider. Monitor your  blood pressure at home as told by your health care provider. Keep all follow-up visits. This is important. Medicines Take over-the-counter and prescription medicines only as told by your health care provider. Follow directions carefully. Blood pressure medicines must be taken as prescribed. Do not skip doses of blood pressure medicine. Doing this puts you at risk for problems and can make the medicine less effective. Ask your health care provider about side effects or reactions to medicines that you should watch for. Contact a health care provider if you: Think you are having a reaction to a medicine you are taking. Have headaches that keep coming back (recurring). Feel dizzy. Have swelling in your ankles. Have trouble with your vision. Get help right away if you: Develop a severe headache or confusion. Have unusual weakness or numbness. Feel faint. Have severe pain in your chest or abdomen. Vomit repeatedly. Have trouble breathing. These symptoms may be an emergency. Get help right away. Call 911. Do not wait to see if the symptoms will go away. Do not drive yourself to the hospital. Summary Hypertension is when the force of blood pumping through your arteries is too strong. If this condition is not controlled, it may put you at risk for serious complications. Your personal target blood pressure may vary  depending on your medical conditions, your age, and other factors. For most people, a normal blood pressure is less than 120/80. Hypertension is treated with lifestyle changes, medicines, or a combination of both. Lifestyle changes include losing weight, eating a healthy, low-sodium diet, exercising more, and limiting alcohol. This information is not intended to replace advice given to you by your health care provider. Make sure you discuss any questions you have with your health care provider. Document Revised: 11/16/2020 Document Reviewed: 11/16/2020 [employer] Patient Education  [employer].

## 2022-04-02 ENCOUNTER — Other Ambulatory Visit: Payer: Self-pay | Admitting: Cardiology

## 2022-07-04 DIAGNOSIS — R3912 Poor urinary stream: Secondary | ICD-10-CM | POA: Diagnosis not present

## 2022-07-10 ENCOUNTER — Ambulatory Visit (INDEPENDENT_AMBULATORY_CARE_PROVIDER_SITE_OTHER): Payer: Medicare Other

## 2022-07-10 ENCOUNTER — Encounter: Payer: Self-pay | Admitting: Emergency Medicine

## 2022-07-10 ENCOUNTER — Other Ambulatory Visit: Payer: Self-pay | Admitting: Emergency Medicine

## 2022-07-10 VITALS — Ht 70.0 in | Wt 183.0 lb

## 2022-07-10 DIAGNOSIS — Z0000 Encounter for general adult medical examination without abnormal findings: Secondary | ICD-10-CM

## 2022-07-10 NOTE — Progress Notes (Signed)
I connected with  Fuller Plan Der Smith on 07/10/22 by a audio enabled telemedicine application and verified that I am speaking with the correct person using two identifiers.  Patient Location: Home  Provider Location: Office/Clinic  I discussed the limitations of evaluation and management by telemedicine. The patient expressed understanding and agreed to proceed.  Subjective:   Oscar Smith is a 77 y.o. male who presents for Medicare Annual/Subsequent preventive examination.  Review of Systems     Cardiac Risk Factors include: advanced age (>74men, >35 women)     Objective:    Today's Vitals   07/10/22 1032  Weight: 183 lb (83 kg)  Height: 5\' 10"  (1.778 m)  PainSc: 0-No pain   Body mass index is 26.26 kg/m.     07/10/2022   10:35 AM 12/14/2021   12:26 PM 08/04/2021    6:52 AM 07/08/2021    8:24 AM 06/09/2020   12:34 PM 07/14/2019    9:03 AM 05/05/2019    2:08 PM  Advanced Directives  Does Patient Have a Medical Advance Directive? Yes Yes Yes Yes Yes Yes Yes  Type of Estate agent of [place];Living will  Healthcare Power of [place];Living will Healthcare Power of [place];Living will Living will Healthcare Power of [place];Living will   Does patient want to make changes to medical advance directive? No - Patient declined No - Patient declined   No - Patient declined    Copy of Healthcare Power of Attorney in Chart? Yes - validated most recent copy scanned in chart (See row information)   No - copy requested  Yes - validated most recent copy scanned in chart (See row information)     Current Medications (verified) Outpatient Encounter Medications as of 07/10/2022  Medication Sig   amLODipine (NORVASC) 5 MG tablet TAKE 1 TABLET BY MOUTH TWICE DAILY.   aspirin EC 81 MG tablet Take 81 mg daily by mouth.   atorvastatin (LIPITOR) 20 MG tablet TAKE ONE TABLET ONCE DAILY   diphenhydramine-acetaminophen (TYLENOL PM) 25-500 MG TABS tablet Take 1  tablet by mouth at bedtime as needed (for sleep).   docusate sodium (COLACE) 100 MG capsule Take 1 capsule (100 mg total) by mouth 2 (two) times daily.   fluticasone (FLONASE) 50 MCG/ACT nasal spray USE TWO SPRAYS IN EACH NOSTRIL ONCE DAILY   hydrocortisone 2.5 % cream Apply topically as needed.   lisinopril (ZESTRIL) 40 MG tablet TAKE ONE TABLET BY MOUTH DAILY   Multiple Vitamin (MULTIVITAMIN) tablet Take 1 tablet by mouth daily.   omeprazole (PRILOSEC) 20 MG capsule TAKE ONE CAPSULE BY MOUTH DAILY   traMADol (ULTRAM) 50 MG tablet Take 1-2 tablets (50-100 mg total) by mouth every 6 (six) hours as needed. For post-op pain   No facility-administered encounter medications on file as of 07/10/2022.    Allergies (verified) Flomax [tamsulosin hcl] and Latex   History: Past Medical History:  Diagnosis Date   BPH with obstruction/lower urinary tract symptoms    urologist--- dr Berneice Heinrich   CAD (coronary artery disease)    cardiologist--- dr c. Bjorn Pippin;  per  note native artery;   nuclear stress test 12-13-2020  low risk w/ fixed perfusion defect apex w/ hypokinesis consistant w/ prior infart,  normal wall motion,  nuclear ef 54%;   coronary CT 01-05-2020  score= 767   ED (erectile dysfunction) of organic origin    First degree heart block    GERD (gastroesophageal reflux disease)    Hiatal hernia  History of colon polyps    2002 and 2003 hyperplastic   History of condyloma acuminatum    penis   History of diverticulitis of colon    History of gastritis    Hypertension    Left bundle branch block    Malignant neoplasm prostate [facility]) 11/2009   urologist--- dr Berneice Heinrich;   dx 11/ 2011,  gleason 3+3,  completed IMRT 03/ 2012   Penile cancer [facility])    urologist--- dr Berneice Heinrich ;    recurrent 11/ 2023   Wears glasses    Wears hearing aid in both ears    Past Surgical History:  Procedure Laterality Date   BIOPSY  08/04/2021   Procedure: BIOPSY;  Surgeon: Meryl Dare, MD;  Location: [facility];  Service: Gastroenterology;;   Alycia Patten N/A 06/09/2015   Procedure: CIRCUMCISION ADULT WITH PENILE BLOCK;  Surgeon: Sebastian Ache, MD;  Location: [facility];  Service: Urology;  Laterality: N/A;   COLONOSCOPY WITH PROPOFOL N/A 08/04/2021   Procedure: COLONOSCOPY WITH PROPOFOL;  Surgeon: Meryl Dare, MD;  Location: [facility];  Service: Gastroenterology;  Laterality: N/A;   CONDYLOMA EXCISION/FULGURATION N/A 06/09/2020   Procedure: EXCISION OF PENILE WARTS;  Surgeon: Sebastian Ache, MD;  Location: [facility];  Service: Urology;  Laterality: N/A;  45 MINS   ENDOVENOUS ABLATION SAPHENOUS VEIN W/ LASER Left 02/10/2016   endovenous laser ablation left greater saphenous vein and stab phlebectomy left leg by Gretta Began MD   INGUINAL HERNIA REPAIR Right 12/23/2009   @[facility]  by dr Jamey Ripa   LIPOMA EXCISION N/A 12/01/2016   Procedure: EXCISION POSTERIOR NECK MASS;  Surgeon: Glenna Fellows, MD;  Location: [facility];  Service: Plastics;  Laterality: N/A;   PENILE BIOPSY N/A 12/14/2021   Procedure: LOCAL EXCISION OF PENILE CANCER WITH PENILE BLOCK;  Surgeon: Sebastian Ache, MD;  Location[facility];  Service: Urology;  Laterality: N/A;  1 HR   POLYPECTOMY  08/04/2021   Procedure: POLYPECTOMY;  Surgeon: Meryl Dare, MD;  Location: [facility];  Service: Gastroenterology;;   SHOULDER ARTHROSCOPY WITH SUBACROMIAL DECOMPRESSION, ROTATOR CUFF REPAIR AND BICEP TENDON REPAIR Left 10/2014   Family History  Problem Relation Age of Onset   Cancer Father 39       stomach versus colon cancer   Pancreatic cancer Sister 56   Cancer Brother        penile cancer   Esophageal cancer Neg Hx    Throat cancer Neg Hx    Social History   Socioeconomic History   Marital status: Married    Spouse name: Not on file   Number of children: 0   Years of education: BS   Highest education level: Not on file  Occupational History    Occupation: Psychologist, educational   Occupation: Art gallery manager  Tobacco Use   Smoking status: Former    Packs/day: 0.50    Years: 2.00    Additional pack years: 0.00    Total pack years: 1.00    Types: Cigarettes    Quit date: 09/04/1965    Years since quitting: 56.8   Smokeless tobacco: Never   Tobacco comments:    quit around 77 yrs old.  Vaping Use   Vaping Use: Never used  Substance and Sexual Activity   Alcohol use: Yes    Alcohol/week: 20.0 standard drinks of alcohol    Types: 20 Standard drinks or equivalent per week    Comment: drink 1-2 at night  Drug use: Never   Sexual activity: Yes    Partners: Female  Other Topics Concern   Not on file  Social History Narrative   ** Merged History Encounter **       Originally [place].   Trained as an Art gallery manager.    Married '75- 11 years, divorced; Married '86. No children.    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 Sculptor-life size figures    by commission (see work in [place]  Sculpting work regularly.   Never smoking.   Alcohol: drinks1- 2 beers and 1-2 wines every night;    Learning the piano. Patient get regular exercise.   Wife is a Engineer, civil (consulting).   Caffeine: 3/day    ADLs: drives; independent with ADLs   Advanced Directives: YES; FULL CODE; no prolonged measures.        Social Determinants of Health   Financial Resource Strain: Low Risk  (07/10/2022)   Overall Financial Resource Strain (CARDIA)    Difficulty of Paying Living Expenses: Not hard at all  Food Insecurity: No Food Insecurity (07/10/2022)   Hunger Vital Sign    Worried About Running Out of Food in the Last Year: Never true    Ran Out of Food in the Last Year: Never true  Transportation Needs: No Transportation Needs (07/10/2022)   PRAPARE - Administrator, Civil Service (Medical): No    Lack of Transportation (Non-Medical): No  Physical Activity: Sufficiently Active  (07/10/2022)   Exercise Vital Sign    Days of Exercise per Week: 5 days    Minutes of Exercise per Session: 30 min  Stress: No Stress Concern Present (07/10/2022)   [employer] of Occupational Health - Occupational Stress Questionnaire    Feeling of Stress : Not at all  Social Connections: Moderately Isolated (07/10/2022)   Social Connection and Isolation Panel [NHANES]    Frequency of Communication with Friends and Family: Twice a week    Frequency of Social Gatherings with Friends and Family: Twice a week    Attends Religious Services: Never    Database administrator or Organizations: No    Attends Banker Meetings: Never    Marital Status: Married    Tobacco Counseling Counseling given: Not Answered Tobacco comments: quit around 77 yrs old.   Clinical Intake:  Pre-visit preparation completed: Yes  Pain : No/denies pain Pain Score: 0-No pain     BMI - recorded: 26.26 Nutritional Status: BMI 25 -29 Overweight Nutritional Risks: None Diabetes: No  How often do you need to have someone help you when you read instructions, pamphlets, or other written materials from your doctor or pharmacy?: 1 - Never What is the last grade level you completed in school?: College Graduate  Diabetic? No  Interpreter Needed?: No  Information entered by :: Macsen Nuttall N. Marcellis Frampton, LPN.   Activities of Daily Living    07/10/2022   10:38 AM 12/14/2021   12:35 PM  In your present state of health, do you have any difficulty performing the following activities:  Hearing? 1 0  Vision? 0 0  Difficulty concentrating or making decisions? 1 0  Comment names   Walking or climbing stairs? 0 0  Dressing or bathing? 0 0  Doing errands, shopping? 0   Preparing Food and eating ? N   Using the Toilet? N   In the past six months, have you accidently leaked urine? N  Do you have problems with loss of bowel control? N   Managing your Medications? N   Managing your Finances? N    Housekeeping or managing your Housekeeping? N     Patient Care Team: Georgina Quint, MD as PCP - General (Internal Medicine) Ethelda Chick, MD as Consulting Physician (Family Medicine) Charna Elizabeth, MD as Consulting Physician (Gastroenterology) Elise Benne, MD as Consulting Physician (Ophthalmology) Lawrence Marseilles Carlena Bjornstad, DMD as Consulting Physician (Dentistry) Ethelda Chick, MD (Family Medicine) Berneice Heinrich Delbert Phenix., MD as Consulting Physician (Urology)  Indicate any recent Medical Services you may have received from other than [facility] providers in the past year (date may be approximate).     Assessment:   This is a routine wellness examination for Oscar Smith.  Hearing/Vision screen Hearing Screening - Comments:: Patient wears hearing aids. Vision Screening - Comments:: Wears rx glasses - up to date with routine eye exams with Elise Benne, MD.   Dietary issues and exercise activities discussed: Current Exercise Habits: Home exercise routine, Type of exercise: walking, Time (Minutes): 30, Frequency (Times/Week): 5, Weekly Exercise (Minutes/Week): 150, Intensity: Moderate, Exercise limited by: None identified   Goals Addressed             This Visit's Progress    My goal for 2024 is to continue to work on my memory, sculpting , taking guitar lessons and eating healthy.        Depression Screen    07/10/2022   10:37 AM 03/28/2022    8:07 AM 09/27/2021    1:03 PM 07/08/2021    8:25 AM 07/08/2021    8:23 AM 07/08/2021    8:22 AM 03/21/2021    8:20 AM  PHQ 2/9 Scores  PHQ - 2 Score 0 0 0 0 0 0 0  PHQ- 9 Score 0          Fall Risk    07/10/2022   10:36 AM 03/28/2022    8:07 AM 10/21/2021    8:20 AM 09/27/2021    1:03 PM 07/08/2021    8:25 AM  Fall Risk   Falls in the past year? 0 0 0 0 0  Number falls in past yr: 0 0 0 0 0  Injury with Fall? 0 0 0 0 0  Risk for fall due to : No Fall Risks No Fall Risks No Fall Risks No Fall Risks   Follow up Falls prevention  discussed Falls evaluation completed Falls evaluation completed Falls evaluation completed Falls evaluation completed;Education provided    FALL RISK PREVENTION PERTAINING TO THE HOME:  Any stairs in or around the home? Yes  If so, are there any without handrails? No  Home free of loose throw rugs in walkways, pet beds, electrical cords, etc? Yes  Adequate lighting in your home to reduce risk of falls? Yes   ASSISTIVE DEVICES UTILIZED TO PREVENT FALLS:  Life alert? No  Use of a cane, walker or w/c? No  Grab bars in the bathroom? No  Shower chair or bench in shower? No  Elevated toilet seat or a handicapped toilet? Yes   TIMED UP AND GO:  Was the test performed? No . Telephonic Visit  Cognitive Function:        07/10/2022   10:37 AM 07/14/2019    9:04 AM 07/10/2018    9:11 AM 09/26/2016    3:38 PM  6CIT Screen  What Year? 0 points 0 points 0 points 0 points  What month? 0 points 0 points  0 points 0 points  What time? 0 points 0 points 0 points 0 points  Count back from 20 0 points 0 points 0 points 0 points  Months in reverse 0 points 0 points 0 points 2 points  Repeat phrase 0 points 0 points 0 points 0 points  Total Score 0 points 0 points 0 points 2 points    Immunizations Immunization History  Administered Date(s) Administered   Fluad Quad(high Dose 65+) 09/18/2018, 10/29/2019   Influenza Split 10/21/2010, 10/18/2011   Influenza, High Dose Seasonal PF 10/31/2017   Influenza,inj,Quad PF,6+ Mos 11/01/2012, 09/25/2013, 11/02/2015, 11/20/2016   Influenza-Unspecified 10/23/2016   MMR 02/13/2013   PFIZER(Purple Top)SARS-COV-2 Vaccination 02/27/2019, 03/24/2019   Pneumococcal Conjugate-13 06/22/2015   Pneumococcal Polysaccharide-23 08/21/2008   Tdap 01/30/2013   Unspecified SARS-COV-2 Vaccination 08/08/2021   Zoster Recombinat (Shingrix) 04/09/2018, 07/04/2018   Zoster, Live 10/21/2010    TDAP status: Up to date  Flu Vaccine status: Due, Education has been provided  regarding the importance of this vaccine. Advised may receive this vaccine at local pharmacy or Health Dept. Aware to provide a copy of the vaccination record if obtained from local pharmacy or Health Dept. Verbalized acceptance and understanding.  Pneumococcal vaccine status: Up to date  Covid-19 vaccine status: Completed vaccines  Qualifies for Shingles Vaccine? Yes   Zostavax completed Yes   Shingrix Completed?: Yes  Screening Tests Health Maintenance  Topic Date Due   COVID-19 Vaccine (4 - 2023-24 season) 10/03/2021   INFLUENZA VACCINE  08/24/2022   DTaP/Tdap/Td (2 - Td or Tdap) 01/31/2023   Medicare Annual Wellness (AWV)  07/10/2023   Pneumonia Vaccine 29+ Years old  Completed   Hepatitis C Screening  Completed   Zoster Vaccines- Shingrix  Completed   HPV VACCINES  Aged Out   Colonoscopy  Discontinued    Health Maintenance  Health Maintenance Due  Topic Date Due   COVID-19 Vaccine (4 - 2023-24 season) 10/03/2021    Colorectal cancer screening: No longer required.   Lung Cancer Screening: (Low Dose CT Chest recommended if Age 106-80 years, 30 pack-year currently smoking OR have quit w/in 15years.) does not qualify.   Lung Cancer Screening Referral: no  Additional Screening:  Hepatitis C Screening: does qualify; Completed 06/22/2015  Vision Screening: Recommended annual ophthalmology exams for early detection of glaucoma and other disorders of the eye. Is the patient up to date with their annual eye exam?  Yes  Who is the provider or what is the name of the office in which the patient attends annual eye exams? Elise Benne, MD. If pt is not established with a provider, would they like to be referred to a provider to establish care? No .   Dental Screening: Recommended annual dental exams for proper oral hygiene  Community Resource Referral / Chronic Care Management: CRR required this visit?  No   CCM required this visit?  No      Plan:     I have personally  reviewed and noted the following in the patient's chart:   Medical and social history Use of alcohol, tobacco or illicit drugs  Current medications and supplements including opioid prescriptions. Patient is not currently taking opioid prescriptions. Functional ability and status Nutritional status Physical activity Advanced directives List of other physicians Hospitalizations, surgeries, and ER visits in previous 12 months Vitals Screenings to include cognitive, depression, and falls Referrals and appointments  In addition, I have reviewed and discussed with patient certain preventive protocols, quality metrics, and best practice  recommendations. A written personalized care plan for preventive services as well as general preventive health recommendations were provided to patient.     Mickeal Needy, LPN   5/40/9811   Nurse Notes: Please schedule your next Medicare Wellness Visit with your Nurse Health Advisor in 1 year by calling [phone].

## 2022-07-10 NOTE — Patient Instructions (Addendum)
Oscar Smith , Thank you for taking time to come for your Medicare Wellness Visit. I appreciate your ongoing commitment to your health goals. Please review the following plan we discussed and let me know if I can assist you in the future.   These are the goals we discussed:  Goals      My goal for 2024 is to continue to work on my memory, sculpting , taking guitar lessons and eating healthy.        This is a list of the screening recommended for you and due dates:  Health Maintenance  Topic Date Due   COVID-19 Vaccine (4 - 2023-24 season) 10/03/2021   Flu Shot  08/24/2022   DTaP/Tdap/Td vaccine (2 - Td or Tdap) 01/31/2023   Medicare Annual Wellness Visit  07/10/2023   Pneumonia Vaccine  Completed   Hepatitis C Screening  Completed   Zoster (Shingles) Vaccine  Completed   HPV Vaccine  Aged Out   Colon Cancer Screening  Discontinued    Advanced directives: Yes; documents on file.  Conditions/risks identified: Yes  Next appointment: 07/16/2023 at 10:45 a.m. telephone visit with Percell Miller, Nurse Health Advisor.  If you need to reschedule or cancel, please call [phone].  Preventive Care 6 Years and Older, Male  Preventive care refers to lifestyle choices and visits with your health care provider that can promote health and wellness. What does preventive care include? A yearly physical exam. This is also called an annual well check. Dental exams once or twice a year. Routine eye exams. Ask your health care provider how often you should have your eyes checked. Personal lifestyle choices, including: Daily care of your teeth and gums. Regular physical activity. Eating a healthy diet. Avoiding tobacco and drug use. Limiting alcohol use. Practicing safe sex. Taking low doses of aspirin every day. Taking vitamin and mineral supplements as recommended by your health care provider. What happens during an annual well check? The services and screenings done by your health care  provider during your annual well check will depend on your age, overall health, lifestyle risk factors, and family history of disease. Counseling  Your health care provider may ask you questions about your: Alcohol use. Tobacco use. Drug use. Emotional well-being. Home and relationship well-being. Sexual activity. Eating habits. History of falls. Memory and ability to understand (cognition). Work and work Astronomer. Screening  You may have the following tests or measurements: Height, weight, and BMI. Blood pressure. Lipid and cholesterol levels. These may be checked every 5 years, or more frequently if you are over 61 years old. Skin check. Lung cancer screening. You may have this screening every year starting at age 16 if you have a 30-pack-year history of smoking and currently smoke or have quit within the past 15 years. Fecal occult blood test (FOBT) of the stool. You may have this test every year starting at age 53. Flexible sigmoidoscopy or colonoscopy. You may have a sigmoidoscopy every 5 years or a colonoscopy every 10 years starting at age 52. Prostate cancer screening. Recommendations will vary depending on your family history and other risks. Hepatitis C blood test. Hepatitis B blood test. Sexually transmitted disease (STD) testing. Diabetes screening. This is done by checking your blood sugar (glucose) after you have not eaten for a while (fasting). You may have this done every 1-3 years. Abdominal aortic aneurysm (AAA) screening. You may need this if you are a current or former smoker. Osteoporosis. You may be screened starting at age  70 if you are at high risk. Talk with your health care provider about your test results, treatment options, and if necessary, the need for more tests. Vaccines  Your health care provider may recommend certain vaccines, such as: Influenza vaccine. This is recommended every year. Tetanus, diphtheria, and acellular pertussis (Tdap, Td)  vaccine. You may need a Td booster every 10 years. Zoster vaccine. You may need this after age 68. Pneumococcal 13-valent conjugate (PCV13) vaccine. One dose is recommended after age 39. Pneumococcal polysaccharide (PPSV23) vaccine. One dose is recommended after age 48. Talk to your health care provider about which screenings and vaccines you need and how often you need them. This information is not intended to replace advice given to you by your health care provider. Make sure you discuss any questions you have with your health care provider. Document Released: 02/05/2015 Document Revised: 09/29/2015 Document Reviewed: 11/10/2014 Elsevier Interactive Patient Education  2017 [employer].  Fall Prevention in the Home Falls can cause injuries. They can happen to people of all ages. There are many things you can do to make your home safe and to help prevent falls. What can I do on the outside of my home? Regularly fix the edges of walkways and driveways and fix any cracks. Remove anything that might make you trip as you walk through a door, such as a raised step or threshold. Trim any bushes or trees on the path to your home. Use bright outdoor lighting. Clear any walking paths of anything that might make someone trip, such as rocks or tools. Regularly check to see if handrails are loose or broken. Make sure that both sides of any steps have handrails. Any raised decks and porches should have guardrails on the edges. Have any leaves, snow, or ice cleared regularly. Use sand or salt on walking paths during winter. Clean up any spills in your garage right away. This includes oil or grease spills. What can I do in the bathroom? Use night lights. Install grab bars by the toilet and in the tub and shower. Do not use towel bars as grab bars. Use non-skid mats or decals in the tub or shower. If you need to sit down in the shower, use a plastic, non-slip stool. Keep the floor dry. Clean up any  water that spills on the floor as soon as it happens. Remove soap buildup in the tub or shower regularly. Attach bath mats securely with double-sided non-slip rug tape. Do not have throw rugs and other things on the floor that can make you trip. What can I do in the bedroom? Use night lights. Make sure that you have a light by your bed that is easy to reach. Do not use any sheets or blankets that are too big for your bed. They should not hang down onto the floor. Have a firm chair that has side arms. You can use this for support while you get dressed. Do not have throw rugs and other things on the floor that can make you trip. What can I do in the kitchen? Clean up any spills right away. Avoid walking on wet floors. Keep items that you use a lot in easy-to-reach places. If you need to reach something above you, use a strong step stool that has a grab bar. Keep electrical cords out of the way. Do not use floor polish or wax that makes floors slippery. If you must use wax, use non-skid floor wax. Do not have throw rugs and other  things on the floor that can make you trip. What can I do with my stairs? Do not leave any items on the stairs. Make sure that there are handrails on both sides of the stairs and use them. Fix handrails that are broken or loose. Make sure that handrails are as long as the stairways. Check any carpeting to make sure that it is firmly attached to the stairs. Fix any carpet that is loose or worn. Avoid having throw rugs at the top or bottom of the stairs. If you do have throw rugs, attach them to the floor with carpet tape. Make sure that you have a light switch at the top of the stairs and the bottom of the stairs. If you do not have them, ask someone to add them for you. What else can I do to help prevent falls? Wear shoes that: Do not have high heels. Have rubber bottoms. Are comfortable and fit you well. Are closed at the toe. Do not wear sandals. If you use a  stepladder: Make sure that it is fully opened. Do not climb a closed stepladder. Make sure that both sides of the stepladder are locked into place. Ask someone to hold it for you, if possible. Clearly mark and make sure that you can see: Any grab bars or handrails. First and last steps. Where the edge of each step is. Use tools that help you move around (mobility aids) if they are needed. These include: Canes. Walkers. Scooters. Crutches. Turn on the lights when you go into a dark area. Replace any light bulbs as soon as they burn out. Set up your furniture so you have a clear path. Avoid moving your furniture around. If any of your floors are uneven, fix them. If there are any pets around you, be aware of where they are. Review your medicines with your doctor. Some medicines can make you feel dizzy. This can increase your chance of falling. Ask your doctor what other things that you can do to help prevent falls. This information is not intended to replace advice given to you by your health care provider. Make sure you discuss any questions you have with your health care provider. Document Released: 11/05/2008 Document Revised: 06/17/2015 Document Reviewed: 02/13/2014 Elsevier Interactive Patient Education  2017 [employer].

## 2022-07-30 ENCOUNTER — Other Ambulatory Visit: Payer: Self-pay | Admitting: Emergency Medicine

## 2022-08-07 DIAGNOSIS — L821 Other seborrheic keratosis: Secondary | ICD-10-CM | POA: Diagnosis not present

## 2022-08-07 DIAGNOSIS — Z85828 Personal history of other malignant neoplasm of skin: Secondary | ICD-10-CM | POA: Diagnosis not present

## 2022-08-20 ENCOUNTER — Other Ambulatory Visit: Payer: Self-pay | Admitting: Emergency Medicine

## 2022-08-23 ENCOUNTER — Encounter (INDEPENDENT_AMBULATORY_CARE_PROVIDER_SITE_OTHER): Payer: Self-pay

## 2022-08-24 DIAGNOSIS — H6123 Impacted cerumen, bilateral: Secondary | ICD-10-CM | POA: Diagnosis not present

## 2022-09-01 ENCOUNTER — Encounter: Payer: Self-pay | Admitting: Emergency Medicine

## 2022-09-04 NOTE — Telephone Encounter (Signed)
Yes.  Recommend RSV vaccine.

## 2022-09-04 NOTE — Telephone Encounter (Signed)
CDC guidance states that influenza, COVID-19 and RSV vaccines can all be given at the same time to eligible individuals.  Also recommend to talk to the pharmacist about this.  Thanks.

## 2022-09-19 ENCOUNTER — Ambulatory Visit (INDEPENDENT_AMBULATORY_CARE_PROVIDER_SITE_OTHER): Payer: Medicare Other | Admitting: Emergency Medicine

## 2022-09-19 ENCOUNTER — Encounter: Payer: Self-pay | Admitting: Emergency Medicine

## 2022-09-19 ENCOUNTER — Ambulatory Visit (INDEPENDENT_AMBULATORY_CARE_PROVIDER_SITE_OTHER): Payer: Medicare Other

## 2022-09-19 VITALS — BP 122/88 | HR 75 | Temp 98.1°F | Ht 70.0 in | Wt 184.1 lb

## 2022-09-19 DIAGNOSIS — E785 Hyperlipidemia, unspecified: Secondary | ICD-10-CM | POA: Diagnosis not present

## 2022-09-19 DIAGNOSIS — R0989 Other specified symptoms and signs involving the circulatory and respiratory systems: Secondary | ICD-10-CM | POA: Diagnosis not present

## 2022-09-19 DIAGNOSIS — J329 Chronic sinusitis, unspecified: Secondary | ICD-10-CM | POA: Diagnosis not present

## 2022-09-19 DIAGNOSIS — M1611 Unilateral primary osteoarthritis, right hip: Secondary | ICD-10-CM

## 2022-09-19 DIAGNOSIS — R5383 Other fatigue: Secondary | ICD-10-CM

## 2022-09-19 DIAGNOSIS — I10 Essential (primary) hypertension: Secondary | ICD-10-CM | POA: Diagnosis not present

## 2022-09-19 DIAGNOSIS — K219 Gastro-esophageal reflux disease without esophagitis: Secondary | ICD-10-CM

## 2022-09-19 DIAGNOSIS — F17200 Nicotine dependence, unspecified, uncomplicated: Secondary | ICD-10-CM | POA: Diagnosis not present

## 2022-09-19 LAB — CBC WITH DIFFERENTIAL/PLATELET
Basophils Absolute: 0 10*3/uL (ref 0.0–0.1)
Basophils Relative: 0.8 % (ref 0.0–3.0)
Eosinophils Absolute: 0.1 10*3/uL (ref 0.0–0.7)
Eosinophils Relative: 1.9 % (ref 0.0–5.0)
HCT: 43.7 % (ref 39.0–52.0)
Hemoglobin: 14.8 g/dL (ref 13.0–17.0)
Lymphocytes Relative: 32.3 % (ref 12.0–46.0)
Lymphs Abs: 1.7 10*3/uL (ref 0.7–4.0)
MCHC: 33.8 g/dL (ref 30.0–36.0)
MCV: 92.1 fl (ref 78.0–100.0)
Monocytes Absolute: 0.6 10*3/uL (ref 0.1–1.0)
Monocytes Relative: 12 % (ref 3.0–12.0)
Neutro Abs: 2.8 10*3/uL (ref 1.4–7.7)
Neutrophils Relative %: 53 % (ref 43.0–77.0)
Platelets: 209 10*3/uL (ref 150.0–400.0)
RBC: 4.74 Mil/uL (ref 4.22–5.81)
RDW: 13 % (ref 11.5–15.5)
WBC: 5.2 10*3/uL (ref 4.0–10.5)

## 2022-09-19 LAB — HEMOGLOBIN A1C: Hgb A1c MFr Bld: 5.1 % (ref 4.6–6.5)

## 2022-09-19 LAB — URINALYSIS
Bilirubin Urine: NEGATIVE
Hgb urine dipstick: NEGATIVE
Ketones, ur: NEGATIVE
Leukocytes,Ua: NEGATIVE
Nitrite: NEGATIVE
Specific Gravity, Urine: 1.015 (ref 1.000–1.030)
Total Protein, Urine: NEGATIVE
Urine Glucose: NEGATIVE
Urobilinogen, UA: 0.2 (ref 0.0–1.0)
pH: 6.5 (ref 5.0–8.0)

## 2022-09-19 LAB — LIPID PANEL
Cholesterol: 142 mg/dL (ref 0–200)
HDL: 41.7 mg/dL (ref >39.00)
LDL Cholesterol: 74 mg/dL (ref 0–99)
NonHDL: 100.48
Total CHOL/HDL Ratio: 3
Triglycerides: 132 mg/dL (ref 0.0–149.0)
VLDL: 26.4 mg/dL (ref 0.0–40.0)

## 2022-09-19 LAB — COMPREHENSIVE METABOLIC PANEL
ALT: 23 U/L (ref 0–53)
AST: 20 U/L (ref 0–37)
Albumin: 4.3 g/dL (ref 3.5–5.2)
Alkaline Phosphatase: 97 U/L (ref 39–117)
BUN: 14 mg/dL (ref 6–23)
CO2: 28 mEq/L (ref 19–32)
Calcium: 9.6 mg/dL (ref 8.4–10.5)
Chloride: 101 mEq/L (ref 96–112)
Creatinine, Ser: 0.92 mg/dL (ref 0.40–1.50)
GFR: 79.28 mL/min (ref >60.00)
Glucose, Bld: 69 mg/dL — ABNORMAL LOW (ref 70–99)
Potassium: 4 mEq/L (ref 3.5–5.1)
Sodium: 136 mEq/L (ref 135–145)
Total Bilirubin: 0.8 mg/dL (ref 0.2–1.2)
Total Protein: 6.9 g/dL (ref 6.0–8.3)

## 2022-09-19 LAB — PSA: PSA: 0.49 ng/mL (ref 0.10–4.00)

## 2022-09-19 LAB — VITAMIN B12: Vitamin B-12: 505 pg/mL (ref 211–911)

## 2022-09-19 LAB — VITAMIN D 25 HYDROXY (VIT D DEFICIENCY, FRACTURES): VITD: 54.96 ng/mL (ref 30.00–100.00)

## 2022-09-19 LAB — TSH: TSH: 1.39 u[IU]/mL (ref 0.35–5.50)

## 2022-09-19 MED ORDER — FLUTICASONE PROPIONATE 50 MCG/ACT NA SUSP: 2.0000 | Freq: Every day | NASAL | 1 refills | Status: DC | PRN

## 2022-09-19 NOTE — Patient Instructions (Signed)
Health Maintenance After Age 79 After age 79, you are at a higher risk for certain long-term diseases and infections as well as injuries from falls. Falls are a major cause of broken bones and head injuries in people who are older than age 79. Getting regular preventive care can help to keep you healthy and well. Preventive care includes getting regular testing and making lifestyle changes as recommended by your health care provider. Talk with your health care provider about: Which screenings and tests you should have. A screening is a test that checks for a disease when you have no symptoms. A diet and exercise plan that is right for you. What should I know about screenings and tests to prevent falls? Screening and testing are the best ways to find a health problem early. Early diagnosis and treatment give you the best chance of managing medical conditions that are common after age 79. Certain conditions and lifestyle choices may make you more likely to have a fall. Your health care provider may recommend: Regular vision checks. Poor vision and conditions such as cataracts can make you more likely to have a fall. If you wear glasses, make sure to get your prescription updated if your vision changes. Medicine review. Work with your health care provider to regularly review all of the medicines you are taking, including over-the-counter medicines. Ask your health care provider about any side effects that may make you more likely to have a fall. Tell your health care provider if any medicines that you take make you feel dizzy or sleepy. Strength and balance checks. Your health care provider may recommend certain tests to check your strength and balance while standing, walking, or changing positions. Foot health exam. Foot pain and numbness, as well as not wearing proper footwear, can make you more likely to have a fall. Screenings, including: Osteoporosis screening. Osteoporosis is a condition that causes  the bones to get weaker and break more easily. Blood pressure screening. Blood pressure changes and medicines to control blood pressure can make you feel dizzy. Depression screening. You may be more likely to have a fall if you have a fear of falling, feel depressed, or feel unable to do activities that you used to do. Alcohol use screening. Using too much alcohol can affect your balance and may make you more likely to have a fall. Follow these instructions at home: Lifestyle Do not drink alcohol if: Your health care provider tells you not to drink. If you drink alcohol: Limit how much you have to: 0-1 drink a day for women. 0-2 drinks a day for men. Know how much alcohol is in your drink. In the [place] one drink equals one 12 oz bottle of beer (355 mL), one 5 oz glass of wine (148 mL), or one 1 oz glass of hard liquor (44 mL). Do not use any products that contain nicotine or tobacco. These products include cigarettes, chewing tobacco, and vaping devices, such as e-cigarettes. If you need help quitting, ask your health care provider. Activity  Follow a regular exercise program to stay fit. This will help you maintain your balance. Ask your health care provider what types of exercise are appropriate for you. If you need a cane or walker, use it as recommended by your health care provider. Wear supportive shoes that have nonskid soles. Safety  Remove any tripping hazards, such as rugs, cords, and clutter. Install safety equipment such as grab bars in bathrooms and safety rails on stairs. Keep rooms and walkways   well-lit. General instructions Talk with your health care provider about your risks for falling. Tell your health care provider if: You fall. Be sure to tell your health care provider about all falls, even ones that seem minor. You feel dizzy, tiredness (fatigue), or off-balance. Take over-the-counter and prescription medicines only as told by your health care provider. These include  supplements. Eat a healthy diet and maintain a healthy weight. A healthy diet includes low-fat dairy products, low-fat (lean) meats, and fiber from whole grains, beans, and lots of fruits and vegetables. Stay current with your vaccines. Schedule regular health, dental, and eye exams. Summary Having a healthy lifestyle and getting preventive care can help to protect your health and wellness after age 79. Screening and testing are the best way to find a health problem early and help you avoid having a fall. Early diagnosis and treatment give you the best chance for managing medical conditions that are more common for people who are older than age 79. Falls are a major cause of broken bones and head injuries in people who are older than age 79. Take precautions to prevent a fall at home. Work with your health care provider to learn what changes you can make to improve your health and wellness and to prevent falls. This information is not intended to replace advice given to you by your health care provider. Make sure you discuss any questions you have with your health care provider. Document Revised: 05/31/2020 Document Reviewed: 05/31/2020 Elsevier Patient Education  2024 [employer] Inc[employer]  

## 2022-09-19 NOTE — Assessment & Plan Note (Signed)
With chronic congestion.  Flonase helps. Contributing to chronic drainage and chronic cough Chest x-ray done today.  Report reviewed

## 2022-09-19 NOTE — Assessment & Plan Note (Signed)
Clinically stable.  No red flag signs or symptoms Benign examination with stable vital signs No recent viral infection Differential diagnosis discussed Recommend blood work and urinalysis today along with chest x-ray No immediate concerns.

## 2022-09-19 NOTE — Assessment & Plan Note (Signed)
Chronic stable condition Continue atorvastatin 20 mg daily. 

## 2022-09-19 NOTE — Assessment & Plan Note (Signed)
Asymptomatic and well-controlled.  Continues omeprazole 20 mg as needed

## 2022-09-19 NOTE — Progress Notes (Signed)
Fuller Plan Der Sommen 77 y.o.   Chief Complaint  Patient presents with   Medical Management of Chronic Issues    f/u appt, several concerns     HISTORY OF PRESENT ILLNESS: This is a 77 y.o. male here for 88-month follow-up of chronic medical conditions including hypertension and dyslipidemia Has the following concerns: 1.  Feels tired at noon.  Also gets tired with walking 2.  Right hip pain.  Has a history of osteoarthritis 3.  Chronic intermittent swelling of legs 4.  Chronic cough with drainage.  Has a history of chronic nasal congestion. Non-smoker.  No other complaints or medical concerns BP Readings from Last 3 Encounters:  03/28/22 118/86  12/14/21 (!) 163/89  11/15/21 120/68   Lab Results  Component Value Date   CHOL 137 03/28/2022   HDL 44.20 03/28/2022   LDLCALC 62 03/28/2022   LDLDIRECT 68.0 09/27/2021   TRIG 156.0 (H) 03/28/2022   CHOLHDL 3 03/28/2022     HPI   Prior to Admission medications   Medication Sig Start Date End Date Taking? Authorizing Provider  amLODipine (NORVASC) 5 MG tablet TAKE 1 TABLET BY MOUTH TWICE DAILY. 01/24/22   Little Ishikawa, MD  aspirin EC 81 MG tablet Take 81 mg daily by mouth.    [provider]  atorvastatin (LIPITOR) 20 MG tablet TAKE ONE TABLET ONCE DAILY 04/03/22   Little Ishikawa, MD  diphenhydramine-acetaminophen (TYLENOL PM) 25-500 MG TABS tablet Take 1 tablet by mouth at bedtime as needed (for sleep).    [provider]  docusate sodium (COLACE) 100 MG capsule Take 1 capsule (100 mg total) by mouth 2 (two) times daily. 12/14/21   Emmerling, Casimiro Needle, MD  fluticasone (FLONASE) 50 MCG/ACT nasal spray USE TWO SPRAYS IN EACH NOSTRIL ONCE DAILY 07/10/22   Georgina Quint, MD  hydrocortisone 2.5 % cream Apply topically as needed.    [provider]  lisinopril (ZESTRIL) 40 MG tablet TAKE ONE TABLET BY MOUTH DAILY 08/20/22   Georgina Quint, MD  Multiple Vitamin  (MULTIVITAMIN) tablet Take 1 tablet by mouth daily.    [provider]  omeprazole (PRILOSEC) 20 MG capsule TAKE ONE CAPSULE BY MOUTH DAILY 07/30/22   Georgina Quint, MD  traMADol (ULTRAM) 50 MG tablet Take 1-2 tablets (50-100 mg total) by mouth every 6 (six) hours as needed. For post-op pain 12/14/21 12/14/22  Loletta Parish., MD    Allergies  Allergen Reactions   Flomax [Tamsulosin Hcl] Other (See Comments)    "bad reaction" = jaw pain   Latex Itching and Rash    Patient Active Problem List   Diagnosis Date Noted   Benign neoplasm of descending colon    Benign neoplasm of transverse colon    Situational depression 03/21/2021   Dyslipidemia 09/17/2019   History of diverticulosis 09/17/2019   Chronic sinus complaints 09/17/2019   Essential hypertension 07/03/2018   Pure hypercholesterolemia 07/03/2018   Osteoarthritis of right hip 02/07/2018   Lumbar radiculopathy 01/24/2017   Enlarged prostate with urinary retention 12/25/2013   Erectile dysfunction 12/25/2013   Incomplete bladder emptying 08/27/2013   Chronic sinusitis 02/20/2012   Frontal mucocele 02/20/2012   Nasal septal deviation 02/20/2012   ADENOCARCINOMA, PROSTATE, GLEASON GRADE 6 12/21/2009   GERD 11/12/2008    Past Medical History:  Diagnosis Date   BPH with obstruction/lower urinary tract symptoms    urologist--- dr Berneice Heinrich   CAD (coronary artery disease)    cardiologist--- dr c.  schumann;  per  note native artery;   nuclear stress test 12-13-2020  low risk w/ fixed perfusion defect apex w/ hypokinesis consistant w/ prior infart,  normal wall motion,  nuclear ef 54%;   coronary CT 01-05-2020  score= 767   ED (erectile dysfunction) of organic origin    First degree heart block    GERD (gastroesophageal reflux disease)    Hiatal hernia    History of colon polyps    2002 and 2003 hyperplastic   History of condyloma acuminatum    penis   History of diverticulitis of colon    History of  gastritis    Hypertension    Left bundle branch block    Malignant neoplasm prostate [facility]) 11/2009   urologist--- dr Berneice Heinrich;   dx 11/ 2011,  gleason 3+3,  completed IMRT 03/ 2012   Penile cancer [facility])    urologist--- dr Berneice Heinrich ;    recurrent 11/ 2023   Wears glasses    Wears hearing aid in both ears     Past Surgical History:  Procedure Laterality Date   BIOPSY  08/04/2021   Procedure: BIOPSY;  Surgeon: Meryl Dare, MD;  Location: [facility];  Service: Gastroenterology;;   CIRCUMCISION N/A 06/09/2015   Procedure: CIRCUMCISION ADULT WITH PENILE BLOCK;  Surgeon: Sebastian Ache, MD;  Location: [facility];  Service: Urology;  Laterality: N/A;   COLONOSCOPY WITH PROPOFOL N/A 08/04/2021   Procedure: COLONOSCOPY WITH PROPOFOL;  Surgeon: Meryl Dare, MD;  Location: [facility];  Service: Gastroenterology;  Laterality: N/A;   CONDYLOMA EXCISION/FULGURATION N/A 06/09/2020   Procedure: EXCISION OF PENILE WARTS;  Surgeon: Sebastian Ache, MD;  Location: [facility];  Service: Urology;  Laterality: N/A;  45 MINS   ENDOVENOUS ABLATION SAPHENOUS VEIN W/ LASER Left 02/10/2016   endovenous laser ablation left greater saphenous vein and stab phlebectomy left leg by Gretta Began MD   INGUINAL HERNIA REPAIR Right 12/23/2009   @[facility]  by dr Jamey Ripa   LIPOMA EXCISION N/A 12/01/2016   Procedure: EXCISION POSTERIOR NECK MASS;  Surgeon: Glenna Fellows, MD;  Location: [facility];  Service: Plastics;  Laterality: N/A;   PENILE BIOPSY N/A 12/14/2021   Procedure: LOCAL EXCISION OF PENILE CANCER WITH PENILE BLOCK;  Surgeon: Sebastian Ache, MD;  Location: [facility];  Service: Urology;  Laterality: N/A;  1 HR   POLYPECTOMY  08/04/2021   Procedure: POLYPECTOMY;  Surgeon: Meryl Dare, MD;  Location: [facility];  Service: Gastroenterology;;   SHOULDER ARTHROSCOPY WITH SUBACROMIAL DECOMPRESSION, ROTATOR CUFF REPAIR AND BICEP TENDON REPAIR Left  10/2014    Social History   Socioeconomic History   Marital status: Married    Spouse name: Not on file   Number of children: 0   Years of education: BS   Highest education level: Not on file  Occupational History   Occupation: Psychologist, educational   Occupation: Art gallery manager  Tobacco Use   Smoking status: Former    Current packs/day: 0.00    Average packs/day: 0.5 packs/day for 2.0 years (1.0 ttl pk-yrs)    Types: Cigarettes    Start date: 09/05/1963    Quit date: 09/04/1965    Years since quitting: 57.0   Smokeless tobacco: Never   Tobacco comments:    quit around 77 yrs old.  Vaping Use   Vaping status: Never Used  Substance and Sexual Activity   Alcohol use: Yes    Alcohol/week: 20.0 standard drinks of alcohol  Types: 20 Standard drinks or equivalent per week    Comment: drink 1-2 at night    Drug use: Never   Sexual activity: Yes    Partners: Female  Other Topics Concern   Not on file  Social History Narrative   ** Merged History Encounter **       Originally [place].   Trained as an Art gallery manager.    Married '75- 11 years, divorced; Married '86. No children.    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 Sculptor-life size figures    by commission (see work in [place]  Sculpting work regularly.   Never smoking.   Alcohol: drinks1- 2 beers and 1-2 wines every night;    Learning the piano. Patient get regular exercise.   Wife is a Engineer, civil (consulting).   Caffeine: 3/day    ADLs: drives; independent with ADLs   Advanced Directives: YES; FULL CODE; no prolonged measures.        Social Determinants of Health   Financial Resource Strain: Low Risk  (07/10/2022)   Overall Financial Resource Strain (CARDIA)    Difficulty of Paying Living Expenses: Not hard at all  Food Insecurity: No Food Insecurity (07/10/2022)   Hunger Vital Sign    Worried About Running Out of Food in the Last Year: Never true    Ran Out of  Food in the Last Year: Never true  Transportation Needs: No Transportation Needs (07/10/2022)   PRAPARE - Administrator, Civil Service (Medical): No    Lack of Transportation (Non-Medical): No  Physical Activity: Sufficiently Active (07/10/2022)   Exercise Vital Sign    Days of Exercise per Week: 5 days    Minutes of Exercise per Session: 30 min  Stress: No Stress Concern Present (07/10/2022)   [employer] of Occupational Health - Occupational Stress Questionnaire    Feeling of Stress : Not at all  Social Connections: Moderately Isolated (07/10/2022)   Social Connection and Isolation Panel [NHANES]    Frequency of Communication with Friends and Family: Twice a week    Frequency of Social Gatherings with Friends and Family: Twice a week    Attends Religious Services: Never    Database administrator or Organizations: No    Attends Banker Meetings: Never    Marital Status: Married  Catering manager Violence: Not At Risk (07/10/2022)   Humiliation, Afraid, Rape, and Kick questionnaire    Fear of Current or Ex-Partner: No    Emotionally Abused: No    Physically Abused: No    Sexually Abused: No    Family History  Problem Relation Age of Onset   Cancer Father 92       stomach versus colon cancer   Pancreatic cancer Sister 20   Cancer Brother        penile cancer   Esophageal cancer Neg Hx    Throat cancer Neg Hx      Review of Systems  Constitutional: Negative.  Negative for chills and fever.  HENT: Negative.  Negative for congestion and sore throat.   Respiratory: Negative.  Negative for cough and shortness of breath.   Cardiovascular: Negative.  Negative for chest pain.  Gastrointestinal:  Negative for abdominal pain, diarrhea, nausea and vomiting.  Genitourinary: Negative.   Skin: Negative.  Negative for rash.  Neurological: Negative.  Negative for dizziness and headaches.  All other systems reviewed and are negative.   Today's  Vitals    09/19/22 0808  BP: 122/88  Pulse: 75  Temp: 98.1 F (36.7 C)  TempSrc: Oral  SpO2: 94%  Weight: 184 lb 2 oz (83.5 kg)  Height: 5\' 10"  (1.778 m)   Body mass index is 26.42 kg/m.   Physical Exam Vitals reviewed.  Constitutional:      Appearance: Normal appearance.  HENT:     Head: Normocephalic.     Mouth/Throat:     Mouth: Mucous membranes are moist.     Pharynx: Oropharynx is clear.  Eyes:     Extraocular Movements: Extraocular movements intact.     Pupils: Pupils are equal, round, and reactive to light.  Cardiovascular:     Rate and Rhythm: Normal rate and regular rhythm.     Pulses: Normal pulses.     Heart sounds: Normal heart sounds.  Pulmonary:     Effort: Pulmonary effort is normal.     Breath sounds: Normal breath sounds.  Musculoskeletal:     Cervical back: No tenderness.  Lymphadenopathy:     Cervical: No cervical adenopathy.  Skin:    General: Skin is warm and dry.     Capillary Refill: Capillary refill takes less than 2 seconds.  Neurological:     General: No focal deficit present.     Mental Status: He is alert and oriented to person, place, and time.  Psychiatric:        Mood and Affect: Mood normal.        Behavior: Behavior normal.   DG Chest 2 View  Result Date: 09/19/2022 CLINICAL DATA:  Tiredness. Chronic congestion. Fatigue. Former smoker. EXAM: CHEST - 2 VIEW COMPARISON:  02/26/2007. FINDINGS: Bilateral lung fields are clear. Bilateral costophrenic angles are clear. Normal cardio-mediastinal silhouette. No acute osseous abnormalities. The soft tissues are within normal limits. IMPRESSION: No active cardiopulmonary disease. Electronically Signed   By: Jules Schick M.D.   On: 09/19/2022 08:47      ASSESSMENT & PLAN: A total of 45 minutes was spent with the patient and counseling/coordination of care regarding preparing for this visit, review of most recent office visit notes, review of multiple chronic medical conditions under management,  review of all medications, review of most recent blood work results, education on nutrition, prognosis, documentation, or need for follow-up.  Problem List Items Addressed This Visit       Cardiovascular and Mediastinum   Essential hypertension - Primary    Well-controlled hypertension Continue lisinopril 40 mg daily and amlodipine 5 mg daily Cardiovascular risks associated with hypertension discussed      Relevant Orders   CBC with Differential/Platelet   Comprehensive metabolic panel   Hemoglobin A1c   Lipid panel     Respiratory   Chronic sinusitis    With chronic congestion.  Flonase helps. Contributing to chronic drainage and chronic cough Chest x-ray done today.  Report reviewed      Relevant Medications   fluticasone (FLONASE) 50 MCG/ACT nasal spray   Other Relevant Orders   DG Chest 2 View     Digestive   GERD    Asymptomatic and well-controlled.  Continues omeprazole 20 mg as needed        Musculoskeletal and Integument   Osteoarthritis of right hip    Creating chronic intermittent right hip pain Pain management discussed Recommend Tylenol and or NSAIDs        Other   Dyslipidemia    Chronic stable condition Continue atorvastatin 20 mg daily  Relevant Orders   Lipid panel   Tiredness    Clinically stable.  No red flag signs or symptoms Benign examination with stable vital signs No recent viral infection Differential diagnosis discussed Recommend blood work and urinalysis today along with chest x-ray No immediate concerns.      Relevant Orders   Urinalysis   CBC with Differential/Platelet   Comprehensive metabolic panel   Hemoglobin A1c   TSH   Vitamin B12   VITAMIN D 25 Hydroxy (Vit-D Deficiency, Fractures)   DG Chest 2 View   PSA   Patient Instructions  Health Maintenance After Age 33 After age 65, you are at a higher risk for certain long-term diseases and infections as well as injuries from falls. Falls are a major cause of  broken bones and head injuries in people who are older than age 62. Getting regular preventive care can help to keep you healthy and well. Preventive care includes getting regular testing and making lifestyle changes as recommended by your health care provider. Talk with your health care provider about: Which screenings and tests you should have. A screening is a test that checks for a disease when you have no symptoms. A diet and exercise plan that is right for you. What should I know about screenings and tests to prevent falls? Screening and testing are the best ways to find a health problem early. Early diagnosis and treatment give you the best chance of managing medical conditions that are common after age 18. Certain conditions and lifestyle choices may make you more likely to have a fall. Your health care provider may recommend: Regular vision checks. Poor vision and conditions such as cataracts can make you more likely to have a fall. If you wear glasses, make sure to get your prescription updated if your vision changes. Medicine review. Work with your health care provider to regularly review all of the medicines you are taking, including over-the-counter medicines. Ask your health care provider about any side effects that may make you more likely to have a fall. Tell your health care provider if any medicines that you take make you feel dizzy or sleepy. Strength and balance checks. Your health care provider may recommend certain tests to check your strength and balance while standing, walking, or changing positions. Foot health exam. Foot pain and numbness, as well as not wearing proper footwear, can make you more likely to have a fall. Screenings, including: Osteoporosis screening. Osteoporosis is a condition that causes the bones to get weaker and break more easily. Blood pressure screening. Blood pressure changes and medicines to control blood pressure can make you feel dizzy. Depression  screening. You may be more likely to have a fall if you have a fear of falling, feel depressed, or feel unable to do activities that you used to do. Alcohol use screening. Using too much alcohol can affect your balance and may make you more likely to have a fall. Follow these instructions at home: Lifestyle Do not drink alcohol if: Your health care provider tells you not to drink. If you drink alcohol: Limit how much you have to: 0-1 drink a day for women. 0-2 drinks a day for men. Know how much alcohol is in your drink. In the [place] one drink equals one 12 oz bottle of beer (355 mL), one 5 oz glass of wine (148 mL), or one 1 oz glass of hard liquor (44 mL). Do not use any products that contain nicotine or tobacco. These products include  cigarettes, chewing tobacco, and vaping devices, such as e-cigarettes. If you need help quitting, ask your health care provider. Activity  Follow a regular exercise program to stay fit. This will help you maintain your balance. Ask your health care provider what types of exercise are appropriate for you. If you need a cane or walker, use it as recommended by your health care provider. Wear supportive shoes that have nonskid soles. Safety  Remove any tripping hazards, such as rugs, cords, and clutter. Install safety equipment such as grab bars in bathrooms and safety rails on stairs. Keep rooms and walkways well-lit. General instructions Talk with your health care provider about your risks for falling. Tell your health care provider if: You fall. Be sure to tell your health care provider about all falls, even ones that seem minor. You feel dizzy, tiredness (fatigue), or off-balance. Take over-the-counter and prescription medicines only as told by your health care provider. These include supplements. Eat a healthy diet and maintain a healthy weight. A healthy diet includes low-fat dairy products, low-fat (lean) meats, and fiber from whole grains, beans, and  lots of fruits and vegetables. Stay current with your vaccines. Schedule regular health, dental, and eye exams. Summary Having a healthy lifestyle and getting preventive care can help to protect your health and wellness after age 56. Screening and testing are the best way to find a health problem early and help you avoid having a fall. Early diagnosis and treatment give you the best chance for managing medical conditions that are more common for people who are older than age 47. Falls are a major cause of broken bones and head injuries in people who are older than age 43. Take precautions to prevent a fall at home. Work with your health care provider to learn what changes you can make to improve your health and wellness and to prevent falls. This information is not intended to replace advice given to you by your health care provider. Make sure you discuss any questions you have with your health care provider. Document Revised: 05/31/2020 Document Reviewed: 05/31/2020 Elsevier Patient Education  2024 Elsevier Inc.      Edwina Barth, MD [facility]

## 2022-09-19 NOTE — Assessment & Plan Note (Signed)
Well-controlled hypertension Continue lisinopril 40 mg daily and amlodipine 5 mg daily Cardiovascular risks associated with hypertension discussed

## 2022-09-19 NOTE — Assessment & Plan Note (Signed)
Creating chronic intermittent right hip pain Pain management discussed Recommend Tylenol and or NSAIDs

## 2022-10-30 ENCOUNTER — Encounter: Payer: Self-pay | Admitting: Emergency Medicine

## 2022-10-31 ENCOUNTER — Ambulatory Visit (INDEPENDENT_AMBULATORY_CARE_PROVIDER_SITE_OTHER): Payer: Medicare Other | Admitting: Internal Medicine

## 2022-10-31 ENCOUNTER — Encounter: Payer: Self-pay | Admitting: Internal Medicine

## 2022-10-31 VITALS — BP 120/86 | HR 62 | Temp 98.3°F | Ht 70.0 in | Wt 180.0 lb

## 2022-10-31 DIAGNOSIS — J019 Acute sinusitis, unspecified: Secondary | ICD-10-CM | POA: Diagnosis not present

## 2022-10-31 DIAGNOSIS — I10 Essential (primary) hypertension: Secondary | ICD-10-CM | POA: Diagnosis not present

## 2022-10-31 MED ORDER — AMOXICILLIN-POT CLAVULANATE 875-125 MG PO TABS: 1.0000 | ORAL_TABLET | Freq: Two times a day (BID) | ORAL | 0 refills | Status: DC

## 2022-10-31 NOTE — Progress Notes (Signed)
Subjective:    Patient ID: Oscar Smith, male    DOB: Jun 15, 1945, 77 y.o.   MRN: [id number]      HPI Oscar Smith is here for  Chief Complaint  Patient presents with   Cough    Dry cough, nasal drainage, stuffy nose x 2 weeks; Came back from [place] 10 days and Oscar Smith has been sick since then.    Oscar Smith is here for an acute visit for cold symptoms.  His symptoms started 10 days after returning from [place].  Typically gets a sinus infection yearly.   Oscar Smith is experiencing fatigue, nasal congestion, PND, sinus pain, hoarseness, dizziness.  Oscar Smith denies cough, SOB, fever.    Oscar Smith has tried taking cough drops, otc cold meds.      Medications and allergies reviewed with patient and updated if appropriate.  Current Outpatient Medications on File Prior to Visit  Medication Sig Dispense Refill   amLODipine (NORVASC) 5 MG tablet TAKE 1 TABLET BY MOUTH TWICE DAILY. 180 tablet 3   aspirin EC 81 MG tablet Take 81 mg daily by mouth.     atorvastatin (LIPITOR) 20 MG tablet TAKE ONE TABLET ONCE DAILY 90 tablet 3   diphenhydramine-acetaminophen (TYLENOL PM) 25-500 MG TABS tablet Take 1 tablet by mouth at bedtime as needed (for sleep).     fluticasone (FLONASE) 50 MCG/ACT nasal spray Place 2 sprays into both nostrils daily as needed for allergies or rhinitis. 48 g 1   hydrocortisone 2.5 % cream Apply topically as needed.     lisinopril (ZESTRIL) 40 MG tablet TAKE ONE TABLET BY MOUTH DAILY 90 tablet 1   Multiple Vitamin (MULTIVITAMIN) tablet Take 1 tablet by mouth daily.     omeprazole (PRILOSEC) 20 MG capsule TAKE ONE CAPSULE BY MOUTH DAILY 90 capsule 1   docusate sodium (COLACE) 100 MG capsule Take 1 capsule (100 mg total) by mouth 2 (two) times daily. (Patient not taking: Reported on 09/19/2022) 10 capsule 0   No current facility-administered medications on file prior to visit.    Review of Systems  Constitutional:  Positive for fatigue. Negative for fever.  HENT:  Positive for congestion,  postnasal drip, sinus pain and voice change. Negative for ear pain (clogged) and sore throat.   Respiratory:  Negative for cough, shortness of breath and wheezing.   Musculoskeletal:  Negative for myalgias.  Neurological:  Positive for dizziness. Negative for light-headedness and headaches.       Objective:   Vitals:   10/31/22 0915  BP: 120/86  Pulse: 62  Temp: 98.3 F (36.8 C)  SpO2: 97%   BP Readings from Last 3 Encounters:  10/31/22 120/86  09/19/22 122/88  03/28/22 118/86   Wt Readings from Last 3 Encounters:  10/31/22 180 lb (81.6 kg)  09/19/22 184 lb 2 oz (83.5 kg)  07/10/22 183 lb (83 kg)   Body mass index is 25.83 kg/m.    Physical Exam Constitutional:      General: Oscar Smith is not in acute distress.    Appearance: Normal appearance. Oscar Smith is not ill-appearing.  HENT:     Head: Normocephalic.     Right Ear: Tympanic membrane, ear canal and external ear normal. There is no impacted cerumen.     Left Ear: Tympanic membrane, ear canal and external ear normal. There is no impacted cerumen.     Mouth/Throat:     Mouth: Mucous membranes are moist.     Pharynx: No oropharyngeal exudate or posterior oropharyngeal erythema.  Eyes:     Conjunctiva/sclera: Conjunctivae normal.  Cardiovascular:     Rate and Rhythm: Normal rate and regular rhythm.  Pulmonary:     Effort: Pulmonary effort is normal. No respiratory distress.     Breath sounds: Normal breath sounds. No wheezing or rales.  Musculoskeletal:     Cervical back: Neck supple. No tenderness.  Lymphadenopathy:     Cervical: No cervical adenopathy.  Skin:    General: Skin is warm and dry.     Findings: No rash.  Neurological:     Mental Status: Oscar Smith is alert.            Assessment & Plan:    Acute sinus infection: Acute Concern for bacterial infection Start Augmentin 875-125 mg BID x 10 day otc cold medications Rest, fluid Call if no improvement  Hypertension: Chronic BP well controlled - diastolic  borderline here which may be related to being sick Continue amlodipine 5 mg, lisinopril 40 mg daily

## 2022-10-31 NOTE — Patient Instructions (Addendum)
        Medications changes include :   Augmentin twice daily for 10 days     Return if symptoms worsen or fail to improve.  

## 2022-12-03 NOTE — Progress Notes (Unsigned)
Cardiology Office Note:    Date:  12/05/2022   ID:  Oscar Smith, DOB [birth year], MRN [id number]  PCP:  Georgina Quint, MD  Cardiologist:  None  Electrophysiologist:  None   Referring MD: Georgina Quint, *   Chief Complaint  Patient presents with   Coronary Artery Disease    History of Present Illness:    Oscar Smith is a 77 y.o. male with a hx of prostate cancer, hypertension who presents for follow-up.  He was referred by Dr. Alvy Bimler for evaluation of left bundle branch block, initially seen on 05/09/2019.  He went to the ED on 05/06/2019 for dizziness.  Felt to be consistent with vertigo.  However work-up notable for new left bundle branch block.  High-sensitivity troponins 11->19->20.  Walks 3-4 times per week for about 25 minutes.  No chest pain, dyspnea, or syncope.  Does report intermittent episodes where it feels like heart is racing, occurs 1-2 times per month.  Can last minutes.  TTE on 05/27/19 showed LVEF 55 to 60%, grade 1 diastolic dysfunction, normal RV systolic function no significant valvular disease.  Calcium score 767 on 01/05/2020 (71st percentile).  Lexiscan Myoview on 12/13/2020 showed fixed inferior perfusion defect with normal wall motion consistent with artifact, fixed perfusion defect at apex with hypokinesis consistent with infarct; low risk study, no evidence of ischemia.  Echocardiogram 12/2020 showed EF 55 to 60%, G1 DD, normal RV function, no significant valvular disease, echodensity in right atrium consistent with prominent crista terminalis.  Zio patch x14 days 12/28/2020 showed no significant arrhythmias.  Since last clinic visit, he reports he is doing okay.  Denies any chest pain, dyspnea,  lower extremity edema, or palpitations.  Reports some lightheadedness but denies any syncope.  Does report some lower extremity edema.  He walks 1-2 times per week for 30 minutes.  Past Medical History:  Diagnosis Date   BPH with  obstruction/lower urinary tract symptoms    urologist--- dr Berneice Heinrich   CAD (coronary artery disease)    cardiologist--- dr c. Bjorn Pippin;  per  note native artery;   nuclear stress test 12-13-2020  low risk w/ fixed perfusion defect apex w/ hypokinesis consistant w/ prior infart,  normal wall motion,  nuclear ef 54%;   coronary CT 01-05-2020  score= 767   ED (erectile dysfunction) of organic origin    First degree heart block    GERD (gastroesophageal reflux disease)    Hiatal hernia    History of colon polyps    2002 and 2003 hyperplastic   History of condyloma acuminatum    penis   History of diverticulitis of colon    History of gastritis    Hypertension    Left bundle branch block    Malignant neoplasm prostate [facility]) 11/2009   urologist--- dr Berneice Heinrich;   dx 11/ 2011,  gleason 3+3,  completed IMRT 03/ 2012   Penile cancer [facility])    urologist--- dr Berneice Heinrich ;    recurrent 11/ 2023   Wears glasses    Wears hearing aid in both ears     Past Surgical History:  Procedure Laterality Date   BIOPSY  08/04/2021   Procedure: BIOPSY;  Surgeon: Meryl Dare, MD;  Location: [facility];  Service: Gastroenterology;;   Alycia Patten N/A 06/09/2015   Procedure: CIRCUMCISION ADULT WITH PENILE BLOCK;  Surgeon: Sebastian Ache, MD;  Location: [facility];  Service: Urology;  Laterality: N/A;   COLONOSCOPY WITH PROPOFOL N/A 08/04/2021  Procedure: COLONOSCOPY WITH PROPOFOL;  Surgeon: Meryl Dare, MD;  Location: [facility];  Service: Gastroenterology;  Laterality: N/A;   CONDYLOMA EXCISION/FULGURATION N/A 06/09/2020   Procedure: EXCISION OF PENILE WARTS;  Surgeon: Sebastian Ache, MD;  Location: [facility];  Service: Urology;  Laterality: N/A;  45 MINS   ENDOVENOUS ABLATION SAPHENOUS VEIN W/ LASER Left 02/10/2016   endovenous laser ablation left greater saphenous vein and stab phlebectomy left leg by Gretta Began MD   INGUINAL HERNIA REPAIR Right 12/23/2009   @[facility]  by dr  Jamey Ripa   LIPOMA EXCISION N/A 12/01/2016   Procedure: EXCISION POSTERIOR NECK MASS;  Surgeon: Glenna Fellows, MD;  Location: [facility];  Service: Plastics;  Laterality: N/A;   PENILE BIOPSY N/A 12/14/2021   Procedure: LOCAL EXCISION OF PENILE CANCER WITH PENILE BLOCK;  Surgeon: Sebastian Ache, MD;  Location: [facility];  Service: Urology;  Laterality: N/A;  1 HR   POLYPECTOMY  08/04/2021   Procedure: POLYPECTOMY;  Surgeon: Meryl Dare, MD;  Location: [facility];  Service: Gastroenterology;;   SHOULDER ARTHROSCOPY WITH SUBACROMIAL DECOMPRESSION, ROTATOR CUFF REPAIR AND BICEP TENDON REPAIR Left 10/2014    Current Medications: Current Meds  Medication Sig   amLODipine (NORVASC) 5 MG tablet TAKE 1 TABLET BY MOUTH TWICE DAILY.   aspirin EC 81 MG tablet Take 81 mg daily by mouth.   atorvastatin (LIPITOR) 20 MG tablet TAKE ONE TABLET ONCE DAILY   diphenhydramine-acetaminophen (TYLENOL PM) 25-500 MG TABS tablet Take 1 tablet by mouth at bedtime as needed (for sleep).   fluticasone (FLONASE) 50 MCG/ACT nasal spray Place 2 sprays into both nostrils daily as needed for allergies or rhinitis.   hydrocortisone 2.5 % cream Apply topically as needed.   lisinopril (ZESTRIL) 40 MG tablet TAKE ONE TABLET BY MOUTH DAILY   Multiple Vitamin (MULTIVITAMIN) tablet Take 1 tablet by mouth daily.   omeprazole (PRILOSEC) 20 MG capsule TAKE ONE CAPSULE BY MOUTH DAILY     Allergies:   Flomax [tamsulosin hcl] and Latex   Social History   Socioeconomic History   Marital status: Married    Spouse name: Not on file   Number of children: 0   Years of education: BS   Highest education level: Not on file  Occupational History   Occupation: Psychologist, educational   Occupation: Art gallery manager  Tobacco Use   Smoking status: Former    Current packs/day: 0.00    Average packs/day: 0.5 packs/day for 2.0 years (1.0 ttl pk-yrs)    Types: Cigarettes    Start date: 09/05/1963    Quit date:  09/04/1965    Years since quitting: 57.2   Smokeless tobacco: Never   Tobacco comments:    quit around 77 yrs old.  Vaping Use   Vaping status: Never Used  Substance and Sexual Activity   Alcohol use: Yes    Alcohol/week: 20.0 standard drinks of alcohol    Types: 20 Standard drinks or equivalent per week    Comment: drink 1-2 at night    Drug use: Never   Sexual activity: Yes    Partners: Female  Other Topics Concern   Not on file  Social History Narrative   ** Merged History Encounter **       Originally [place].   Trained as an Art gallery manager.    Married '75- 11 years, divorced; Married '86. No children.    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  Sculpting work regularly.   Never smoking.   Alcohol: drinks1- 2 beers and 1-2 wines every night;    Learning the piano. Patient get regular exercise.   Wife is a Engineer, civil (consulting).   Caffeine: 3/day    ADLs: drives; independent with ADLs   Advanced Directives: YES; FULL CODE; no prolonged measures.        Social Determinants of Health   Financial Resource Strain: Low Risk  (07/10/2022)   Overall Financial Resource Strain (CARDIA)    Difficulty of Paying Living Expenses: Not hard at all  Food Insecurity: No Food Insecurity (07/10/2022)   Hunger Vital Sign    Worried About Running Out of Food in the Last Year: Never true    Ran Out of Food in the Last Year: Never true  Transportation Needs: No Transportation Needs (07/10/2022)   PRAPARE - Administrator, Civil Service (Medical): No    Lack of Transportation (Non-Medical): No  Physical Activity: Sufficiently Active (07/10/2022)   Exercise Vital Sign    Days of Exercise per Week: 5 days    Minutes of Exercise per Session: 30 min  Stress: No Stress Concern Present (07/10/2022)   [employer] of Occupational Health - Occupational  Stress Questionnaire    Feeling of Stress : Not at all  Social Connections: Moderately Isolated (07/10/2022)   Social Connection and Isolation Panel [NHANES]    Frequency of Communication with Friends and Family: Twice a week    Frequency of Social Gatherings with Friends and Family: Twice a week    Attends Religious Services: Never    Database administrator or Organizations: No    Attends Engineer, structural: Never    Marital Status: Married     Family History: The patient'sfamily history includes Cancer in his brother; Cancer (age of onset: 27) in his father; Pancreatic cancer (age of onset: 24) in his sister. There is no history of Esophageal cancer or Throat cancer.  ROS:   Please see the history of present illness.    All other systems reviewed and are negative.  EKGs/Labs/Other Studies Reviewed:    The following studies were reviewed today:  EKG:   12/05/2022: Sinus rhythm, first-degree AV block, left bundle branch block, rate 72 11/15/2021: Sinus rhythm, first-degree AV block, left bundle branch block, rate 78 12/02/2020: Sinus rhythm with first-degree AV block, left bundle branch block, rate 67   TTE 05/27/19:  1. Left ventricular ejection fraction, by estimation, is 55 to 60%. The  left ventricle has normal function. The left ventricle has no regional  wall motion abnormalities. Left ventricular diastolic parameters are  consistent with Grade I diastolic  dysfunction (impaired relaxation).   2. Right ventricular systolic function is normal. The right ventricular  size is mildly enlarged.   3. Left atrial size was mildly dilated.   4. The mitral valve is normal in structure. Trivial mitral valve  regurgitation. No evidence of mitral stenosis.   5. The aortic valve is tricuspid. Aortic valve regurgitation is not  visualized. Mild aortic valve sclerosis is present, with no evidence of  aortic valve stenosis.   6. The inferior vena cava is normal in size with  greater than 50%  respiratory variability, suggesting right atrial pressure of 3 mmHg.   Recent Labs: 09/19/2022: ALT 23; BUN 14; Creatinine, Ser 0.92; Hemoglobin 14.8; Platelets 209.0; Potassium 4.0; Sodium 136; TSH 1.39  Recent  Lipid Panel    Component Value Date/Time   CHOL 142 09/19/2022 0843   CHOL 153 09/17/2019 0957   TRIG 132.0 09/19/2022 0843   HDL 41.70 09/19/2022 0843   HDL 46 09/17/2019 0957   CHOLHDL 3 09/19/2022 0843   VLDL 26.4 09/19/2022 0843   LDLCALC 74 09/19/2022 0843   LDLCALC 90 09/17/2019 0957   LDLDIRECT 68.0 09/27/2021 1326    Physical Exam:    VS:  BP 130/68 (BP Location: Left Arm, Patient Position: Sitting, Cuff Size: Normal)   Pulse 72   Ht 5\' 10"  (1.778 m)   Wt 185 lb (83.9 kg)   SpO2 98%   BMI 26.54 kg/m     Wt Readings from Last 3 Encounters:  12/05/22 185 lb (83.9 kg)  10/31/22 180 lb (81.6 kg)  09/19/22 184 lb 2 oz (83.5 kg)     GEN: Well nourished, well developed in no acute distress HEENT: Normal NECK: No JVD CARDIAC: RRR, no murmurs, rubs, gallops RESPIRATORY:  Clear to auscultation without rales, wheezing or rhonchi  ABDOMEN: Soft, non-tender, non-distended MUSCULOSKELETAL:  Trace edema SKIN: Warm and dry NEUROLOGIC:  Alert and oriented x 3 PSYCHIATRIC:  Normal affect   ASSESSMENT:    1. Coronary artery disease involving native coronary artery of native heart without angina pectoris   2. Lower extremity edema   3. Essential hypertension   4. Nonspecific abnormal electrocardiogram (ECG) (EKG)   5. Hyperlipidemia, unspecified hyperlipidemia type     PLAN:    CAD:  Calcium score 767 on 01/05/2020 (71st percentile).  Lexiscan Myoview on 12/13/2020 showed fixed inferior perfusion defect with normal wall motion consistent with artifact, fixed perfusion defect at apex with hypokinesis consistent with infarct; low risk study, no evidence of ischemia.   -Continue aspirin 81 mg daily -Continue atorvastatin 20 mg daily  LE edema:  Reports recent lower extremity edema, trace on exam today.  Update echocardiogram.  Check CMET, BNP.  If unremarkable, suspect likely due to amlodipine use  Left bundle branch block: Echocardiogram 12/2020 showed EF 55 to 60%, G1 DD, normal RV function, no significant valvular disease, echodensity in right atrium consistent with prominent crista terminalis.   -Update echocardiogram as above  Palpitations: Zio patch x14 days 12/28/2020 showed no significant arrhythmias.  Hypertension: On lisinopril 40 mg daily and amlodipine 5 mg twice daily.  Appears controlled  Hyperlipidemia: On atorvastatin 20 mg daily.  LDL 74 on 09/19/2022.  Calcium score 767 on 01/05/2020 (71st percentile)  Snoring/daytime somnolence: Normal sleep study 08/2019   RTC in 1 year      Medication Adjustments/Labs and Tests Ordered: Current medicines are reviewed at length with the patient today.  Concerns regarding medicines are outlined above.  Orders Placed This Encounter  Procedures   Comprehensive Metabolic Panel (CMET)   B Nat Peptide   EKG 12-Lead   ECHOCARDIOGRAM COMPLETE    No orders of the defined types were placed in this encounter.    Patient Instructions  Medication Instructions:  Continue current medications *If you need a refill on your cardiac medications before your next appointment, please call your pharmacy*   Lab Work: CMET, BNP today If you have labs (blood work) drawn today and your tests are completely normal, you will receive your results only by: MyChart Message (if you have MyChart) OR A paper copy in the mail If you have any lab test that is abnormal or we need to change your treatment, we will call you to review the results.  Testing/Procedures: Echo  Your physician has requested that you have an echocardiogram. Echocardiography is a painless test that uses sound waves to create images of your heart. It provides your doctor with information about the size and shape of your  heart and how well your heart's chambers and valves are working. This procedure takes approximately one hour. There are no restrictions for this procedure. Please do NOT wear cologne, perfume, aftershave, or lotions (deodorant is allowed). Please arrive 15 minutes prior to your appointment time.  Please note: We ask at that you not bring children with you during ultrasound (echo/ vascular) testing. Due to room size and safety concerns, children are not allowed in the ultrasound rooms during exams. Our front office staff cannot provide observation of children in our lobby area while testing is being conducted. An adult accompanying a patient to their appointment will only be allowed in the ultrasound room at the discretion of the ultrasound technician under special circumstances. We apologize for any inconvenience.    Follow-Up: At [facility], you and your health needs are our priority.  As part of our continuing mission to provide you with exceptional heart care, we have created designated Provider Care Teams.  These Care Teams include your primary Cardiologist (physician) and Advanced Practice Providers (APPs -  Physician Assistants and Nurse Practitioners) who all work together to provide you with the care you need, when you need it.  We recommend signing up for the patient portal called "MyChart".  Sign up information is provided on this After Visit Summary.  MyChart is used to connect with patients for Virtual Visits (Telemedicine).  Patients are able to view lab/test results, encounter notes, upcoming appointments, etc.  Non-urgent messages can be sent to your provider as well.   To learn more about what you can do with MyChart, go to [email or web address].    Your next appointment:   1 year(s)  Provider:  Dr. Bjorn Pippin     Signed, Little Ishikawa, MD  12/05/2022 8:38 AM    [facility]

## 2022-12-05 ENCOUNTER — Ambulatory Visit: Payer: Medicare Other | Attending: Cardiology | Admitting: Cardiology

## 2022-12-05 ENCOUNTER — Encounter: Payer: Self-pay | Admitting: Cardiology

## 2022-12-05 VITALS — BP 130/68 | HR 72 | Ht 70.0 in | Wt 185.0 lb

## 2022-12-05 DIAGNOSIS — R600 Localized edema: Secondary | ICD-10-CM

## 2022-12-05 DIAGNOSIS — I2510 Atherosclerotic heart disease of native coronary artery without angina pectoris: Secondary | ICD-10-CM | POA: Diagnosis not present

## 2022-12-05 DIAGNOSIS — I10 Essential (primary) hypertension: Secondary | ICD-10-CM

## 2022-12-05 DIAGNOSIS — R9431 Abnormal electrocardiogram [ECG] [EKG]: Secondary | ICD-10-CM | POA: Diagnosis not present

## 2022-12-05 DIAGNOSIS — E785 Hyperlipidemia, unspecified: Secondary | ICD-10-CM | POA: Diagnosis not present

## 2022-12-05 NOTE — Patient Instructions (Signed)
Medication Instructions:  Continue current medications *If you need a refill on your cardiac medications before your next appointment, please call your pharmacy*   Lab Work: CMET, BNP today If you have labs (blood work) drawn today and your tests are completely normal, you will receive your results only by: MyChart Message (if you have MyChart) OR A paper copy in the mail If you have any lab test that is abnormal or we need to change your treatment, we will call you to review the results.   Testing/Procedures: Echo  Your physician has requested that you have an echocardiogram. Echocardiography is a painless test that uses sound waves to create images of your heart. It provides your doctor with information about the size and shape of your heart and how well your heart's chambers and valves are working. This procedure takes approximately one hour. There are no restrictions for this procedure. Please do NOT wear cologne, perfume, aftershave, or lotions (deodorant is allowed). Please arrive 15 minutes prior to your appointment time.  Please note: We ask at that you not bring children with you during ultrasound (echo/ vascular) testing. Due to room size and safety concerns, children are not allowed in the ultrasound rooms during exams. Our front office staff cannot provide observation of children in our lobby area while testing is being conducted. An adult accompanying a patient to their appointment will only be allowed in the ultrasound room at the discretion of the ultrasound technician under special circumstances. We apologize for any inconvenience.    Follow-Up: At [facility], you and your health needs are our priority.  As part of our continuing mission to provide you with exceptional heart care, we have created designated Provider Care Teams.  These Care Teams include your primary Cardiologist (physician) and Advanced Practice Providers (APPs -  Physician Assistants and Nurse  Practitioners) who all work together to provide you with the care you need, when you need it.  We recommend signing up for the patient portal called "MyChart".  Sign up information is provided on this After Visit Summary.  MyChart is used to connect with patients for Virtual Visits (Telemedicine).  Patients are able to view lab/test results, encounter notes, upcoming appointments, etc.  Non-urgent messages can be sent to your provider as well.   To learn more about what you can do with MyChart, go to [email or web address].    Your next appointment:   1 year(s)  Provider:  Dr. Bjorn Pippin

## 2022-12-06 LAB — COMPREHENSIVE METABOLIC PANEL
ALT: 22 [IU]/L (ref 0–44)
AST: 21 [IU]/L (ref 0–40)
Albumin: 4.4 g/dL (ref 3.8–4.8)
Alkaline Phosphatase: 107 [IU]/L (ref 44–121)
BUN/Creatinine Ratio: 12 (ref 10–24)
BUN: 10 mg/dL (ref 8–27)
Bilirubin Total: 0.6 mg/dL (ref 0.0–1.2)
CO2: 24 mmol/L (ref 20–29)
Calcium: 9.5 mg/dL (ref 8.6–10.2)
Chloride: 99 mmol/L (ref 96–106)
Creatinine, Ser: 0.84 mg/dL (ref 0.76–1.27)
Globulin, Total: 2.3 g/dL (ref 1.5–4.5)
Glucose: 80 mg/dL (ref 70–99)
Potassium: 4.3 mmol/L (ref 3.5–5.2)
Sodium: 136 mmol/L (ref 134–144)
Total Protein: 6.7 g/dL (ref 6.0–8.5)
eGFR: 89 mL/min/{1.73_m2} (ref >59)

## 2022-12-06 LAB — BRAIN NATRIURETIC PEPTIDE: BNP: 36.4 pg/mL (ref 0.0–100.0)

## 2022-12-18 DIAGNOSIS — C44629 Squamous cell carcinoma of skin of left upper limb, including shoulder: Secondary | ICD-10-CM | POA: Diagnosis not present

## 2022-12-25 DIAGNOSIS — H6123 Impacted cerumen, bilateral: Secondary | ICD-10-CM | POA: Diagnosis not present

## 2023-01-02 DIAGNOSIS — R3912 Poor urinary stream: Secondary | ICD-10-CM | POA: Diagnosis not present

## 2023-01-03 DIAGNOSIS — L0889 Other specified local infections of the skin and subcutaneous tissue: Secondary | ICD-10-CM | POA: Diagnosis not present

## 2023-01-10 DIAGNOSIS — L218 Other seborrheic dermatitis: Secondary | ICD-10-CM | POA: Diagnosis not present

## 2023-01-10 DIAGNOSIS — D1801 Hemangioma of skin and subcutaneous tissue: Secondary | ICD-10-CM | POA: Diagnosis not present

## 2023-01-10 DIAGNOSIS — L821 Other seborrheic keratosis: Secondary | ICD-10-CM | POA: Diagnosis not present

## 2023-01-10 DIAGNOSIS — L812 Freckles: Secondary | ICD-10-CM | POA: Diagnosis not present

## 2023-01-10 DIAGNOSIS — Z85828 Personal history of other malignant neoplasm of skin: Secondary | ICD-10-CM | POA: Diagnosis not present

## 2023-01-11 ENCOUNTER — Encounter: Payer: Self-pay | Admitting: Emergency Medicine

## 2023-01-11 ENCOUNTER — Ambulatory Visit (INDEPENDENT_AMBULATORY_CARE_PROVIDER_SITE_OTHER): Payer: Medicare Other | Admitting: Emergency Medicine

## 2023-01-11 VITALS — BP 128/72 | HR 79 | Temp 97.9°F | Ht 70.0 in | Wt 189.0 lb

## 2023-01-11 DIAGNOSIS — J0101 Acute recurrent maxillary sinusitis: Secondary | ICD-10-CM | POA: Diagnosis not present

## 2023-01-11 DIAGNOSIS — J329 Chronic sinusitis, unspecified: Secondary | ICD-10-CM

## 2023-01-11 DIAGNOSIS — E785 Hyperlipidemia, unspecified: Secondary | ICD-10-CM | POA: Diagnosis not present

## 2023-01-11 DIAGNOSIS — I10 Essential (primary) hypertension: Secondary | ICD-10-CM | POA: Diagnosis not present

## 2023-01-11 LAB — COMPREHENSIVE METABOLIC PANEL
ALT: 25 U/L (ref 0–53)
AST: 22 U/L (ref 0–37)
Albumin: 4.2 g/dL (ref 3.5–5.2)
Alkaline Phosphatase: 81 U/L (ref 39–117)
BUN: 11 mg/dL (ref 6–23)
CO2: 28 meq/L (ref 19–32)
Calcium: 9.3 mg/dL (ref 8.4–10.5)
Chloride: 101 meq/L (ref 96–112)
Creatinine, Ser: 0.82 mg/dL (ref 0.40–1.50)
GFR: 83.54 mL/min (ref >60.00)
Glucose, Bld: 85 mg/dL (ref 70–99)
Potassium: 4.2 meq/L (ref 3.5–5.1)
Sodium: 137 meq/L (ref 135–145)
Total Bilirubin: 0.8 mg/dL (ref 0.2–1.2)
Total Protein: 6.8 g/dL (ref 6.0–8.3)

## 2023-01-11 LAB — CBC WITH DIFFERENTIAL/PLATELET
Basophils Absolute: 0 10*3/uL (ref 0.0–0.1)
Basophils Relative: 0.9 % (ref 0.0–3.0)
Eosinophils Absolute: 0.1 10*3/uL (ref 0.0–0.7)
Eosinophils Relative: 1.8 % (ref 0.0–5.0)
HCT: 43.2 % (ref 39.0–52.0)
Hemoglobin: 14.5 g/dL (ref 13.0–17.0)
Lymphocytes Relative: 29.9 % (ref 12.0–46.0)
Lymphs Abs: 1.5 10*3/uL (ref 0.7–4.0)
MCHC: 33.6 g/dL (ref 30.0–36.0)
MCV: 92.4 fL (ref 78.0–100.0)
Monocytes Absolute: 0.5 10*3/uL (ref 0.1–1.0)
Monocytes Relative: 10.7 % (ref 3.0–12.0)
Neutro Abs: 2.8 10*3/uL (ref 1.4–7.7)
Neutrophils Relative %: 56.7 % (ref 43.0–77.0)
Platelets: 213 10*3/uL (ref 150.0–400.0)
RBC: 4.68 Mil/uL (ref 4.22–5.81)
RDW: 13.5 % (ref 11.5–15.5)
WBC: 4.9 10*3/uL (ref 4.0–10.5)

## 2023-01-11 MED ORDER — AMOXICILLIN-POT CLAVULANATE 875-125 MG PO TABS
1.0000 | ORAL_TABLET | Freq: Two times a day (BID) | ORAL | 0 refills | Status: AC
Start: 2023-01-11 — End: 2023-01-21

## 2023-01-11 NOTE — Assessment & Plan Note (Signed)
Well-controlled hypertension Continue lisinopril 40 mg daily and amlodipine 5 mg daily Cardiovascular risks associated with hypertension discussed

## 2023-01-11 NOTE — Patient Instructions (Signed)

## 2023-01-11 NOTE — Assessment & Plan Note (Signed)
Partially treated.  Still having lingering symptoms. Clinically stable. Recommend to restart Augmentin 875 mg twice a day for 10 days Advised to contact the office if no better or worse during the next several days

## 2023-01-11 NOTE — Assessment & Plan Note (Signed)
With acute exacerbation today. Uses Flonase as needed Recommend saline nasal sprays frequently during the day

## 2023-01-11 NOTE — Assessment & Plan Note (Signed)
Chronic stable condition Continue atorvastatin 20 mg daily.

## 2023-01-11 NOTE — Progress Notes (Signed)
Oscar Smith 77 y.o.   Chief Complaint  Patient presents with   Nausea    Patient states has been going on for at least a month, nausea, slight dizziness when he stands, queasy stomach, generally feeling sick but no fever, more tired than usual. He states he has this sense of nervousness     HISTORY OF PRESENT ILLNESS: This is a 77 y.o. male complaining of persistent symptoms localized to his head consisting of nasal congestion, dizziness, occasional headache Has history of chronic sinusitis with occasional exacerbations Was seen last here on 10/31/2022 with acute exacerbation of sinusitis and started on Augmentin twice a day for 10 days.  Felt better but not 100%. Feels like symptoms are still lingering.  Frequent traveler to [place].  Had just returned prior to office visit on 10/31/2022. Denies syncope but at times feels lightheaded.  Denies fever or chills.  Denies visual symptoms.  Occasional nausea but no vomiting Denies weakness of extremities or any other associated symptoms Not coughing.  Denies chest pain or trouble breathing. Denies abdominal pain or diarrhea No other complaints or medical concerns today.  HPI   Prior to Admission medications   Medication Sig Start Date End Date Taking? Authorizing Provider  ABRYSVO 120 MCG/0.5ML injection Inject 0.5 mLs into the muscle once. 09/11/22  Yes [provider]  amLODipine (NORVASC) 5 MG tablet TAKE 1 TABLET BY MOUTH TWICE DAILY. 01/24/22  Yes Little Ishikawa, MD  amoxicillin-clavulanate (AUGMENTIN) 875-125 MG tablet Take 1 tablet by mouth 2 (two) times daily for 10 days. 01/11/23 01/21/23 Yes SagardiaEilleen Kempf, MD  aspirin EC 81 MG tablet Take 81 mg daily by mouth.   Yes [provider]  atorvastatin (LIPITOR) 20 MG tablet TAKE ONE TABLET ONCE DAILY 04/03/22  Yes Little Ishikawa, MD  diphenhydramine-acetaminophen (TYLENOL PM) 25-500 MG TABS tablet Take 1 tablet by mouth at bedtime as  needed (for sleep).   Yes [provider]  fluticasone (FLONASE) 50 MCG/ACT nasal spray Place 2 sprays into both nostrils daily as needed for allergies or rhinitis. 09/19/22  Yes Kimberleigh Mehan, Eilleen Kempf, MD  FLUZONE HIGH-DOSE 0.5 ML injection Inject 0.5 mLs into the muscle once. 09/11/22  Yes [provider]  hydrocortisone 2.5 % cream Apply topically as needed.   Yes [provider]  lisinopril (ZESTRIL) 40 MG tablet TAKE ONE TABLET BY MOUTH DAILY 08/20/22  Yes Demir Titsworth, Eilleen Kempf, MD  Multiple Vitamin (MULTIVITAMIN) tablet Take 1 tablet by mouth daily.   Yes [provider]  omeprazole (PRILOSEC) 20 MG capsule TAKE ONE CAPSULE BY MOUTH DAILY 07/30/22  Yes Taiwo Fish, [place], MD  [place] syringe Inject 0.5 mLs into the muscle once. 08/25/22  Yes [provider]  amoxicillin-clavulanate (AUGMENTIN) 875-125 MG tablet Take 1 tablet by mouth 2 (two) times daily. Patient not taking: Reported on 01/11/2023 10/31/22   Pincus Sanes, MD  docusate sodium (COLACE) 100 MG capsule Take 1 capsule (100 mg total) by mouth 2 (two) times daily. Patient not taking: Reported on 01/11/2023 12/14/21   Carlus Pavlov, MD    Allergies  Allergen Reactions   Flomax [Tamsulosin Hcl] Other (See Comments)    "bad reaction" = jaw pain   Latex Itching and Rash    Patient Active Problem List   Diagnosis Date Noted   Tiredness 09/19/2022   Benign neoplasm of descending colon    Benign neoplasm of transverse colon    Situational depression 03/21/2021   Dyslipidemia 09/17/2019  History of diverticulosis 09/17/2019   Chronic sinus complaints 09/17/2019   Essential hypertension 07/03/2018   Pure hypercholesterolemia 07/03/2018   Osteoarthritis of right hip 02/07/2018   Lumbar radiculopathy 01/24/2017   Enlarged prostate with urinary retention 12/25/2013   Erectile dysfunction 12/25/2013   Incomplete bladder emptying 08/27/2013   Chronic sinusitis 02/20/2012   Frontal  mucocele 02/20/2012   Nasal septal deviation 02/20/2012   ADENOCARCINOMA, PROSTATE, GLEASON GRADE 6 12/21/2009   GERD 11/12/2008    Past Medical History:  Diagnosis Date   BPH with obstruction/lower urinary tract symptoms    urologist--- dr Berneice Heinrich   CAD (coronary artery disease)    cardiologist--- dr c. Bjorn Pippin;  per  note native artery;   nuclear stress test 12-13-2020  low risk w/ fixed perfusion defect apex w/ hypokinesis consistant w/ prior infart,  normal wall motion,  nuclear ef 54%;   coronary CT 01-05-2020  score= 767   ED (erectile dysfunction) of organic origin    First degree heart block    GERD (gastroesophageal reflux disease)    Hiatal hernia    History of colon polyps    2002 and 2003 hyperplastic   History of condyloma acuminatum    penis   History of diverticulitis of colon    History of gastritis    Hypertension    Left bundle branch block    Malignant neoplasm prostate [facility]) 11/2009   urologist--- dr Berneice Heinrich;   dx 11/ 2011,  gleason 3+3,  completed IMRT 03/ 2012   Penile cancer [facility])    urologist--- dr Berneice Heinrich ;    recurrent 11/ 2023   Wears glasses    Wears hearing aid in both ears     Past Surgical History:  Procedure Laterality Date   BIOPSY  08/04/2021   Procedure: BIOPSY;  Surgeon: Meryl Dare, MD;  Location: [facility];  Service: Gastroenterology;;   Alycia Patten N/A 06/09/2015   Procedure: CIRCUMCISION ADULT WITH PENILE BLOCK;  Surgeon: Sebastian Ache, MD;  Location: [facility];  Service: Urology;  Laterality: N/A;   COLONOSCOPY WITH PROPOFOL N/A 08/04/2021   Procedure: COLONOSCOPY WITH PROPOFOL;  Surgeon: Meryl Dare, MD;  Location: [facility];  Service: Gastroenterology;  Laterality: N/A;   CONDYLOMA EXCISION/FULGURATION N/A 06/09/2020   Procedure: EXCISION OF PENILE WARTS;  Surgeon: Sebastian Ache, MD;  Location: [facility];  Service: Urology;  Laterality: N/A;  45 MINS   ENDOVENOUS ABLATION SAPHENOUS  VEIN W/ LASER Left 02/10/2016   endovenous laser ablation left greater saphenous vein and stab phlebectomy left leg by Gretta Began MD   INGUINAL HERNIA REPAIR Right 12/23/2009   @[facility]  by dr Jamey Ripa   LIPOMA EXCISION N/A 12/01/2016   Procedure: EXCISION POSTERIOR NECK MASS;  Surgeon: Glenna Fellows, MD;  Location: [facility];  Service: Plastics;  Laterality: N/A;   PENILE BIOPSY N/A 12/14/2021   Procedure: LOCAL EXCISION OF PENILE CANCER WITH PENILE BLOCK;  Surgeon: Sebastian Ache, MD;  Location: [facility];  Service: Urology;  Laterality: N/A;  1 HR   POLYPECTOMY  08/04/2021   Procedure: POLYPECTOMY;  Surgeon: Meryl Dare, MD;  Location: [facility];  Service: Gastroenterology;;   SHOULDER ARTHROSCOPY WITH SUBACROMIAL DECOMPRESSION, ROTATOR CUFF REPAIR AND BICEP TENDON REPAIR Left 10/2014    Social History   Socioeconomic History   Marital status: Married    Spouse name: Not on file   Number of children: 0   Years of education: BS   Highest education  level: Not on file  Occupational History   Occupation: Psychologist, educational   Occupation: Art gallery manager  Tobacco Use   Smoking status: Former    Current packs/day: 0.00    Average packs/day: 0.5 packs/day for 2.0 years (1.0 ttl pk-yrs)    Types: Cigarettes    Start date: 09/05/1963    Quit date: 09/04/1965    Years since quitting: 57.3   Smokeless tobacco: Never   Tobacco comments:    quit around 77 yrs old.  Vaping Use   Vaping status: Never Used  Substance and Sexual Activity   Alcohol use: Yes    Alcohol/week: 20.0 standard drinks of alcohol    Types: 20 Standard drinks or equivalent per week    Comment: drink 1-2 at night    Drug use: Never   Sexual activity: Yes    Partners: Female  Other Topics Concern   Not on file  Social History Narrative   ** Merged History Encounter **       Originally from [place].   Trained as an Art gallery manager.    Married '75- 11 years, divorced; Married '86. No  children.    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 Sculptor-life size figures    by commission (see work in [place]  Sculpting work regularly.   Never smoking.   Alcohol: drinks1- 2 beers and 1-2 wines every night;    Learning the piano. Patient get regular exercise.   Wife is a Engineer, civil (consulting).   Caffeine: 3/day    ADLs: drives; independent with ADLs   Advanced Directives: YES; FULL CODE; no prolonged measures.        Social Drivers of Corporate investment banker Strain: Low Risk  (07/10/2022)   Overall Financial Resource Strain (CARDIA)    Difficulty of Paying Living Expenses: Not hard at all  Food Insecurity: No Food Insecurity (07/10/2022)   Hunger Vital Sign    Worried About Running Out of Food in the Last Year: Never true    Ran Out of Food in the Last Year: Never true  Transportation Needs: No Transportation Needs (07/10/2022)   PRAPARE - Administrator, Civil Service (Medical): No    Lack of Transportation (Non-Medical): No  Physical Activity: Sufficiently Active (07/10/2022)   Exercise Vital Sign    Days of Exercise per Week: 5 days    Minutes of Exercise per Session: 30 min  Stress: No Stress Concern Present (07/10/2022)   [employer] of Occupational Health - Occupational Stress Questionnaire    Feeling of Stress : Not at all  Social Connections: Moderately Isolated (07/10/2022)   Social Connection and Isolation Panel [NHANES]    Frequency of Communication with Friends and Family: Twice a week    Frequency of Social Gatherings with Friends and Family: Twice a week    Attends Religious Services: Never    Database administrator or Organizations: No    Attends Banker Meetings: Never    Marital Status: Married  Catering manager Violence: Not At Risk (07/10/2022)   Humiliation, Afraid, Rape, and Kick questionnaire    Fear of Current or Ex-Partner: No    Emotionally Abused: No     Physically Abused: No    Sexually Abused: No    Family History  Problem Relation Age of Onset   Cancer Father 86       stomach versus colon cancer   Pancreatic cancer Sister 13  Cancer Brother        penile cancer   Esophageal cancer Neg Hx    Throat cancer Neg Hx      Review of Systems  Constitutional: Negative.  Negative for chills and fever.  HENT:  Positive for congestion.   Eyes:  Negative for blurred vision and double vision.  Respiratory: Negative.  Negative for cough and shortness of breath.   Cardiovascular: Negative.  Negative for chest pain and palpitations.  Gastrointestinal:  Positive for nausea. Negative for abdominal pain, diarrhea and vomiting.  Genitourinary: Negative.  Negative for dysuria and hematuria.  Skin: Negative.  Negative for rash.  Neurological:  Positive for dizziness. Negative for sensory change, speech change, focal weakness, seizures, loss of consciousness and headaches.  All other systems reviewed and are negative.   Vitals:   01/11/23 1057  BP: 128/72  Pulse: 79  Temp: 97.9 F (36.6 C)  SpO2: 95%    Physical Exam Vitals reviewed.  Constitutional:      Appearance: Normal appearance.  HENT:     Head: Normocephalic.     Ears:     Comments: Hearing aids in place, both ears    Mouth/Throat:     Mouth: Mucous membranes are moist.     Pharynx: Oropharynx is clear.  Eyes:     Extraocular Movements: Extraocular movements intact.     Conjunctiva/sclera: Conjunctivae normal.     Pupils: Pupils are equal, round, and reactive to light.  Cardiovascular:     Rate and Rhythm: Normal rate and regular rhythm.     Pulses: Normal pulses.     Heart sounds: Normal heart sounds.  Pulmonary:     Effort: Pulmonary effort is normal.     Breath sounds: Normal breath sounds.  Abdominal:     Palpations: Abdomen is soft.     Tenderness: There is no abdominal tenderness.  Musculoskeletal:     Cervical back: No tenderness.  Lymphadenopathy:      Cervical: No cervical adenopathy.  Skin:    General: Skin is warm and dry.  Neurological:     General: No focal deficit present.     Mental Status: He is alert and oriented to person, place, and time.  Psychiatric:        Mood and Affect: Mood normal.        Behavior: Behavior normal.      ASSESSMENT & PLAN: A total of 34 minutes was spent with the patient and counseling/coordination of care regarding preparing for this visit, review of most recent office visit notes, review of multiple chronic medical conditions and their management, review of all medications, diagnosis of partially treated sinusitis and need to restart antibiotics, review of most recent bloodwork results, review of health maintenance items, education on nutrition, prognosis, documentation, and need for follow up.   Problem List Items Addressed This Visit       Cardiovascular and Mediastinum   Essential hypertension   Well-controlled hypertension Continue lisinopril 40 mg daily and amlodipine 5 mg daily Cardiovascular risks associated with hypertension discussed        Respiratory   Chronic sinusitis   With acute exacerbation today. Uses Flonase as needed Recommend saline nasal sprays frequently during the day      Relevant Medications   amoxicillin-clavulanate (AUGMENTIN) 875-125 MG tablet   Acute recurrent maxillary sinusitis - Primary   Partially treated.  Still having lingering symptoms. Clinically stable. Recommend to restart Augmentin 875 mg twice a day for 10 days Advised  to contact the office if no better or worse during the next several days      Relevant Medications   amoxicillin-clavulanate (AUGMENTIN) 875-125 MG tablet   Other Relevant Orders   CBC with Differential/Platelet   Comprehensive metabolic panel     Other   Dyslipidemia   Chronic stable condition Continue atorvastatin 20 mg daily      Patient Instructions  Sinus Infection, Adult A sinus infection is soreness and  swelling (inflammation) of your sinuses. Sinuses are hollow spaces in the bones around your face. They are located: Around your eyes. In the middle of your forehead. Behind your nose. In your cheekbones. Your sinuses and nasal passages are lined with a fluid called mucus. Mucus drains out of your sinuses. Swelling can trap mucus in your sinuses. This lets germs (bacteria, virus, or fungus) grow, which leads to infection. Most of the time, this condition is caused by a virus. What are the causes? Allergies. Asthma. Germs. Things that block your nose or sinuses. Growths in the nose (nasal polyps). Chemicals or irritants in the air. A fungus. This is rare. What increases the risk? Having a weak body defense system (immune system). Doing a lot of swimming or diving. Using nasal sprays too much. Smoking. What are the signs or symptoms? The main symptoms of this condition are pain and a feeling of pressure around the sinuses. Other symptoms include: Stuffy nose (congestion). This may make it hard to breathe through your nose. Runny nose (drainage). Soreness, swelling, and warmth in the sinuses. A cough that may get worse at night. Being unable to smell and taste. Mucus that collects in the throat or the back of the nose (postnasal drip). This may cause a sore throat or bad breath. Being very tired (fatigued). A fever. How is this diagnosed? Your symptoms. Your medical history. A physical exam. Tests to find out if your condition is short-term (acute) or long-term (chronic). Your doctor may: Check your nose for growths (polyps). Check your sinuses using a tool that has a light on one end (endoscope). Check for allergies or germs. Do imaging tests, such as an MRI or CT scan. How is this treated? Treatment for this condition depends on the cause and whether it is short-term or long-term. If caused by a virus, your symptoms should go away on their own within 10 days. You may be given  medicines to relieve symptoms. They include: Medicines that shrink swollen tissue in the nose. A spray that treats swelling of the nostrils. Rinses that help get rid of thick mucus in your nose (nasal saline washes). Medicines that treat allergies (antihistamines). Over-the-counter pain relievers. If caused by bacteria, your doctor may wait to see if you will get better without treatment. You may be given antibiotic medicine if you have: A very bad infection. A weak body defense system. If caused by growths in the nose, surgery may be needed. Follow these instructions at home: Medicines Take, use, or apply over-the-counter and prescription medicines only as told by your doctor. These may include nasal sprays. If you were prescribed an antibiotic medicine, take it as told by your doctor. Do not stop taking it even if you start to feel better. Hydrate and humidify  Drink enough water to keep your pee (urine) pale yellow. Use a cool mist humidifier to keep the humidity level in your home above 50%. Breathe in steam for 10-15 minutes, 3-4 times a day, or as told by your doctor. You can do this  in the bathroom while a hot shower is running. Try not to spend time in cool or dry air. Rest Rest as much as you can. Sleep with your head raised (elevated). Make sure you get enough sleep each night. General instructions  Put a warm, moist washcloth on your face 3-4 times a day, or as often as told by your doctor. Use nasal saline washes as often as told by your doctor. Wash your hands often with soap and water. If you cannot use soap and water, use hand sanitizer. Do not smoke. Avoid being around people who are smoking (secondhand smoke). Keep all follow-up visits. Contact a doctor if: You have a fever. Your symptoms get worse. Your symptoms do not get better within 10 days. Get help right away if: You have a very bad headache. You cannot stop vomiting. You have very bad pain or swelling  around your face or eyes. You have trouble seeing. You feel confused. Your neck is stiff. You have trouble breathing. These symptoms may be an emergency. Get help right away. Call 911. Do not wait to see if the symptoms will go away. Do not drive yourself to the hospital. Summary A sinus infection is swelling of your sinuses. Sinuses are hollow spaces in the bones around your face. This condition is caused by tissues in your nose that become inflamed or swollen. This traps germs. These can lead to infection. If you were prescribed an antibiotic medicine, take it as told by your doctor. Do not stop taking it even if you start to feel better. Keep all follow-up visits. This information is not intended to replace advice given to you by your health care provider. Make sure you discuss any questions you have with your health care provider. Document Revised: 12/14/2020 Document Reviewed: 12/14/2020 Elsevier Patient Education  2024 Elsevier Inc.      Edwina Barth, MD [facility]

## 2023-01-12 ENCOUNTER — Ambulatory Visit (HOSPITAL_COMMUNITY): Payer: Medicare Other | Attending: Cardiology

## 2023-01-12 DIAGNOSIS — R9431 Abnormal electrocardiogram [ECG] [EKG]: Secondary | ICD-10-CM | POA: Diagnosis not present

## 2023-01-12 LAB — ECHOCARDIOGRAM COMPLETE
Area-P 1/2: 7.02 cm2
Calc EF: 50.4 %
S' Lateral: 2 cm
Single Plane A2C EF: 50 %
Single Plane A4C EF: 47.8 %

## 2023-01-28 ENCOUNTER — Other Ambulatory Visit: Payer: Self-pay | Admitting: Cardiology

## 2023-02-04 ENCOUNTER — Encounter: Payer: Self-pay | Admitting: Emergency Medicine

## 2023-02-05 ENCOUNTER — Other Ambulatory Visit: Payer: Self-pay | Admitting: Emergency Medicine

## 2023-02-05 MED ORDER — ESOMEPRAZOLE MAGNESIUM 40 MG PO CPDR: 40.0000 mg | DELAYED_RELEASE_CAPSULE | Freq: Every day | ORAL | 3 refills | Status: DC

## 2023-02-05 NOTE — Telephone Encounter (Signed)
 New prescription for Nexium 40 mg sent to pharmacy of record today.  Thanks.

## 2023-02-11 ENCOUNTER — Other Ambulatory Visit: Payer: Self-pay | Admitting: Cardiology

## 2023-02-18 ENCOUNTER — Other Ambulatory Visit: Payer: Self-pay | Admitting: Emergency Medicine

## 2023-02-27 DIAGNOSIS — B078 Other viral warts: Secondary | ICD-10-CM | POA: Diagnosis not present

## 2023-03-13 DIAGNOSIS — H2513 Age-related nuclear cataract, bilateral: Secondary | ICD-10-CM | POA: Diagnosis not present

## 2023-03-13 DIAGNOSIS — H524 Presbyopia: Secondary | ICD-10-CM | POA: Diagnosis not present

## 2023-03-22 ENCOUNTER — Ambulatory Visit (INDEPENDENT_AMBULATORY_CARE_PROVIDER_SITE_OTHER): Payer: Medicare Other | Admitting: Emergency Medicine

## 2023-03-22 ENCOUNTER — Encounter: Payer: Self-pay | Admitting: Emergency Medicine

## 2023-03-22 ENCOUNTER — Other Ambulatory Visit: Payer: Self-pay | Admitting: Radiology

## 2023-03-22 VITALS — BP 124/74 | HR 73 | Temp 97.7°F | Ht 70.0 in | Wt 187.0 lb

## 2023-03-22 DIAGNOSIS — Z23 Encounter for immunization: Secondary | ICD-10-CM | POA: Diagnosis not present

## 2023-03-22 DIAGNOSIS — I10 Essential (primary) hypertension: Secondary | ICD-10-CM

## 2023-03-22 DIAGNOSIS — F4321 Adjustment disorder with depressed mood: Secondary | ICD-10-CM

## 2023-03-22 DIAGNOSIS — E785 Hyperlipidemia, unspecified: Secondary | ICD-10-CM | POA: Diagnosis not present

## 2023-03-22 DIAGNOSIS — Z8546 Personal history of malignant neoplasm of prostate: Secondary | ICD-10-CM | POA: Diagnosis not present

## 2023-03-22 DIAGNOSIS — M419 Scoliosis, unspecified: Secondary | ICD-10-CM

## 2023-03-22 LAB — LIPID PANEL
Cholesterol: 126 mg/dL (ref 0–200)
HDL: 40.1 mg/dL (ref >39.00)
LDL Cholesterol: 52 mg/dL (ref 0–99)
NonHDL: 85.61
Total CHOL/HDL Ratio: 3
Triglycerides: 168 mg/dL — ABNORMAL HIGH (ref 0.0–149.0)
VLDL: 33.6 mg/dL (ref 0.0–40.0)

## 2023-03-22 LAB — COMPREHENSIVE METABOLIC PANEL
ALT: 23 U/L (ref 0–53)
AST: 18 U/L (ref 0–37)
Albumin: 4.2 g/dL (ref 3.5–5.2)
Alkaline Phosphatase: 92 U/L (ref 39–117)
BUN: 11 mg/dL (ref 6–23)
CO2: 27 meq/L (ref 19–32)
Calcium: 9.1 mg/dL (ref 8.4–10.5)
Chloride: 102 meq/L (ref 96–112)
Creatinine, Ser: 0.89 mg/dL (ref 0.40–1.50)
GFR: 81.39 mL/min (ref >60.00)
Glucose, Bld: 91 mg/dL (ref 70–99)
Potassium: 3.9 meq/L (ref 3.5–5.1)
Sodium: 137 meq/L (ref 135–145)
Total Bilirubin: 0.7 mg/dL (ref 0.2–1.2)
Total Protein: 6.8 g/dL (ref 6.0–8.3)

## 2023-03-22 LAB — CBC WITH DIFFERENTIAL/PLATELET
Basophils Absolute: 0 10*3/uL (ref 0.0–0.1)
Basophils Relative: 0.8 % (ref 0.0–3.0)
Eosinophils Absolute: 0.1 10*3/uL (ref 0.0–0.7)
Eosinophils Relative: 1.8 % (ref 0.0–5.0)
HCT: 43.4 % (ref 39.0–52.0)
Hemoglobin: 14.9 g/dL (ref 13.0–17.0)
Lymphocytes Relative: 29.9 % (ref 12.0–46.0)
Lymphs Abs: 1.5 10*3/uL (ref 0.7–4.0)
MCHC: 34.4 g/dL (ref 30.0–36.0)
MCV: 91.5 fl (ref 78.0–100.0)
Monocytes Absolute: 0.6 10*3/uL (ref 0.1–1.0)
Monocytes Relative: 12.5 % — ABNORMAL HIGH (ref 3.0–12.0)
Neutro Abs: 2.8 10*3/uL (ref 1.4–7.7)
Neutrophils Relative %: 55 % (ref 43.0–77.0)
Platelets: 209 10*3/uL (ref 150.0–400.0)
RBC: 4.74 Mil/uL (ref 4.22–5.81)
RDW: 12.7 % (ref 11.5–15.5)
WBC: 5 10*3/uL (ref 4.0–10.5)

## 2023-03-22 LAB — PSA: PSA: 0.3 ng/mL (ref 0.10–4.00)

## 2023-03-22 LAB — HEMOGLOBIN A1C: Hgb A1c MFr Bld: 5.3 % (ref 4.6–6.5)

## 2023-03-22 MED ORDER — AZITHROMYCIN 250 MG PO TABS: ORAL_TABLET | ORAL | 0 refills | Status: DC

## 2023-03-22 NOTE — Telephone Encounter (Signed)
 Please refer to physical therapy as requested.  Thanks.

## 2023-03-22 NOTE — Assessment & Plan Note (Signed)
 Situational intermittent symptoms.  Stable however. Stress and depression management discussed.

## 2023-03-22 NOTE — Assessment & Plan Note (Signed)
 Stable.  Has occasional minimal leakage. No concerns.  Follows up with urologist on a regular basis

## 2023-03-22 NOTE — Patient Instructions (Signed)
 Hypertension, Adult High blood pressure (hypertension) is when the force of blood pumping through the arteries is too strong. The arteries are the blood vessels that carry blood from the heart throughout the body. Hypertension forces the heart to work harder to pump blood and may cause arteries to become narrow or stiff. Untreated or uncontrolled hypertension can lead to a heart attack, heart failure, a stroke, kidney disease, and other problems. A blood pressure reading consists of a higher number over a lower number. Ideally, your blood pressure should be below 120/80. The first ("top") number is called the systolic pressure. It is a measure of the pressure in your arteries as your heart beats. The second ("bottom") number is called the diastolic pressure. It is a measure of the pressure in your arteries as the heart relaxes. What are the causes? The exact cause of this condition is not known. There are some conditions that result in high blood pressure. What increases the risk? Certain factors may make you more likely to develop high blood pressure. Some of these risk factors are under your control, including: Smoking. Not getting enough exercise or physical activity. Being overweight. Having too much fat, sugar, calories, or salt (sodium) in your diet. Drinking too much alcohol. Other risk factors include: Having a personal history of heart disease, diabetes, high cholesterol, or kidney disease. Stress. Having a family history of high blood pressure and high cholesterol. Having obstructive sleep apnea. Age. The risk increases with age. What are the signs or symptoms? High blood pressure may not cause symptoms. Very high blood pressure (hypertensive crisis) may cause: Headache. Fast or irregular heartbeats (palpitations). Shortness of breath. Nosebleed. Nausea and vomiting. Vision changes. Severe chest pain, dizziness, and seizures. How is this diagnosed? This condition is diagnosed by  measuring your blood pressure while you are seated, with your arm resting on a flat surface, your legs uncrossed, and your feet flat on the floor. The cuff of the blood pressure monitor will be placed directly against the skin of your upper arm at the level of your heart. Blood pressure should be measured at least twice using the same arm. Certain conditions can cause a difference in blood pressure between your right and left arms. If you have a high blood pressure reading during one visit or you have normal blood pressure with other risk factors, you may be asked to: Return on a different day to have your blood pressure checked again. Monitor your blood pressure at home for 1 week or longer. If you are diagnosed with hypertension, you may have other blood or imaging tests to help your health care provider understand your overall risk for other conditions. How is this treated? This condition is treated by making healthy lifestyle changes, such as eating healthy foods, exercising more, and reducing your alcohol intake. You may be referred for counseling on a healthy diet and physical activity. Your health care provider may prescribe medicine if lifestyle changes are not enough to get your blood pressure under control and if: Your systolic blood pressure is above 130. Your diastolic blood pressure is above 80. Your personal target blood pressure may vary depending on your medical conditions, your age, and other factors. Follow these instructions at home: Eating and drinking  Eat a diet that is high in fiber and potassium, and low in sodium, added sugar, and fat. An example of this eating plan is called the DASH diet. DASH stands for Dietary Approaches to Stop Hypertension. To eat this way: Eat  plenty of fresh fruits and vegetables. Try to fill one half of your plate at each meal with fruits and vegetables. Eat whole grains, such as whole-wheat pasta, brown rice, or whole-grain bread. Fill about one  fourth of your plate with whole grains. Eat or drink low-fat dairy products, such as skim milk or low-fat yogurt. Avoid fatty cuts of meat, processed or cured meats, and poultry with skin. Fill about one fourth of your plate with lean proteins, such as fish, chicken without skin, beans, eggs, or tofu. Avoid pre-made and processed foods. These tend to be higher in sodium, added sugar, and fat. Reduce your daily sodium intake. Many people with hypertension should eat less than 1,500 mg of sodium a day. Do not drink alcohol if: Your health care provider tells you not to drink. You are pregnant, may be pregnant, or are planning to become pregnant. If you drink alcohol: Limit how much you have to: 0-1 drink a day for women. 0-2 drinks a day for men. Know how much alcohol is in your drink. In the [place] one drink equals one 12 oz bottle of beer (355 mL), one 5 oz glass of wine (148 mL), or one 1 oz glass of hard liquor (44 mL). Lifestyle  Work with your health care provider to maintain a healthy body weight or to lose weight. Ask what an ideal weight is for you. Get at least 30 minutes of exercise that causes your heart to beat faster (aerobic exercise) most days of the week. Activities may include walking, swimming, or biking. Include exercise to strengthen your muscles (resistance exercise), such as Pilates or lifting weights, as part of your weekly exercise routine. Try to do these types of exercises for 30 minutes at least 3 days a week. Do not use any products that contain nicotine or tobacco. These products include cigarettes, chewing tobacco, and vaping devices, such as e-cigarettes. If you need help quitting, ask your health care provider. Monitor your blood pressure at home as told by your health care provider. Keep all follow-up visits. This is important. Medicines Take over-the-counter and prescription medicines only as told by your health care provider. Follow directions carefully. Blood  pressure medicines must be taken as prescribed. Do not skip doses of blood pressure medicine. Doing this puts you at risk for problems and can make the medicine less effective. Ask your health care provider about side effects or reactions to medicines that you should watch for. Contact a health care provider if you: Think you are having a reaction to a medicine you are taking. Have headaches that keep coming back (recurring). Feel dizzy. Have swelling in your ankles. Have trouble with your vision. Get help right away if you: Develop a severe headache or confusion. Have unusual weakness or numbness. Feel faint. Have severe pain in your chest or abdomen. Vomit repeatedly. Have trouble breathing. These symptoms may be an emergency. Get help right away. Call 911. Do not wait to see if the symptoms will go away. Do not drive yourself to the hospital. Summary Hypertension is when the force of blood pumping through your arteries is too strong. If this condition is not controlled, it may put you at risk for serious complications. Your personal target blood pressure may vary depending on your medical conditions, your age, and other factors. For most people, a normal blood pressure is less than 120/80. Hypertension is treated with lifestyle changes, medicines, or a combination of both. Lifestyle changes include losing weight, eating a healthy,  low-sodium diet, exercising more, and limiting alcohol. This information is not intended to replace advice given to you by your health care provider. Make sure you discuss any questions you have with your health care provider. Document Revised: 11/16/2020 Document Reviewed: 11/16/2020 [employer] Patient Education  2024 [employer].

## 2023-03-22 NOTE — Assessment & Plan Note (Signed)
Well-controlled hypertension Continue lisinopril 40 mg daily and amlodipine 5 mg daily Cardiovascular risks associated with hypertension discussed

## 2023-03-22 NOTE — Progress Notes (Signed)
 Oscar Smith 78 y.o.   Chief Complaint  Patient presents with   Follow-up    6 month f/u HTN. Patient states still having a runny nose, still feel faint sometimes. Patients wants to know if he needs Tdap booster. Pt mentions going to [place] in [place] and wants to know if he can have a zpack for when he travel     HISTORY OF PRESENT ILLNESS: This is a 78 y.o. male here for 43-month follow-up of hypertension Overall doing well Stressed out over international affairs, particularly in [place]. Needs Tdap booster. Will be traveling to [place] soon.  Requesting prescription for azithromycin BP Readings from Last 3 Encounters:  01/11/23 128/72  12/05/22 130/68  10/31/22 120/86     HPI   Prior to Admission medications   Medication Sig Start Date End Date Taking? Authorizing Provider  ABRYSVO 120 MCG/0.5ML injection Inject 0.5 mLs into the muscle once. 09/11/22  Yes [provider]  amLODipine (NORVASC) 5 MG tablet TAKE 1 TABLET BY MOUTH TWICE DAILY. 01/30/23  Yes Little Ishikawa, MD  aspirin EC 81 MG tablet Take 81 mg daily by mouth.   Yes [provider]  atorvastatin (LIPITOR) 20 MG tablet TAKE ONE TABLET ONCE DAILY 02/13/23  Yes Little Ishikawa, MD  diphenhydramine-acetaminophen (TYLENOL PM) 25-500 MG TABS tablet Take 1 tablet by mouth at bedtime as needed (for sleep).   Yes [provider]  esomeprazole (NEXIUM) 40 MG capsule Take 1 capsule (40 mg total) by mouth daily. 02/05/23  Yes Mariem Skolnick, Eilleen Kempf, MD  fluticasone [facility]) 50 MCG/ACT nasal spray Place 2 sprays into both nostrils daily as needed for allergies or rhinitis. 09/19/22  Yes Auryn Paige, Eilleen Kempf, MD  hydrocortisone 2.5 % cream Apply topically as needed.   Yes [provider]  lisinopril (ZESTRIL) 40 MG tablet TAKE ONE TABLET BY MOUTH DAILY 02/18/23  Yes Nellene Courtois, Eilleen Kempf, MD  Multiple Vitamin (MULTIVITAMIN) tablet Take 1 tablet by mouth daily.   Yes [provider]  FLUZONE HIGH-DOSE 0.5 ML injection Inject 0.5 mLs into the muscle once. Patient not taking: Reported on 03/22/2023 09/11/22   [provider]  [facility] syringe Inject 0.5 mLs into the muscle once. Patient not taking: Reported on 03/22/2023 08/25/22   [provider]    Allergies  Allergen Reactions   Flomax [Tamsulosin Hcl] Other (See Comments)    "bad reaction" = jaw pain   Latex Itching and Rash    Patient Active Problem List   Diagnosis Date Noted   Benign neoplasm of descending colon    Benign neoplasm of transverse colon    Situational depression 03/21/2021   Dyslipidemia 09/17/2019   History of diverticulosis 09/17/2019   Chronic sinus complaints 09/17/2019   Essential hypertension 07/03/2018   Pure hypercholesterolemia 07/03/2018   Osteoarthritis of right hip 02/07/2018   Enlarged prostate with urinary retention 12/25/2013   Erectile dysfunction 12/25/2013   Chronic sinusitis 02/20/2012   Frontal mucocele 02/20/2012   Nasal septal deviation 02/20/2012   ADENOCARCINOMA, PROSTATE, GLEASON GRADE 6 12/21/2009   GERD 11/12/2008    Past Medical History:  Diagnosis Date   BPH with obstruction/lower urinary tract symptoms    urologist--- dr Berneice Heinrich   CAD (coronary artery disease)    cardiologist--- dr c. Bjorn Pippin;  per  note native artery;   nuclear stress test 12-13-2020  low risk w/ fixed perfusion defect apex w/ hypokinesis consistant w/ prior infart,  normal wall motion,  nuclear ef 54%;  coronary CT 01-05-2020  score= 767   ED (erectile dysfunction) of organic origin    First degree heart block    GERD (gastroesophageal reflux disease)    Hiatal hernia    History of colon polyps    2002 and 2003 hyperplastic   History of condyloma acuminatum    penis   History of diverticulitis of colon    History of gastritis    Hypertension    Left bundle branch block    Malignant neoplasm prostate [facility]) 11/2009   urologist--- dr Berneice Heinrich;   dx 11/  2011,  gleason 3+3,  completed IMRT 03/ 2012   Penile cancer [facility])    urologist--- dr Berneice Heinrich ;    recurrent 11/ 2023   Wears glasses    Wears hearing aid in both ears     Past Surgical History:  Procedure Laterality Date   BIOPSY  08/04/2021   Procedure: BIOPSY;  Surgeon: Meryl Dare, MD;  Location: [facility];  Service: Gastroenterology;;   Alycia Patten N/A 06/09/2015   Procedure: CIRCUMCISION ADULT WITH PENILE BLOCK;  Surgeon: Sebastian Ache, MD;  Location: [facility];  Service: Urology;  Laterality: N/A;   COLONOSCOPY WITH PROPOFOL N/A 08/04/2021   Procedure: COLONOSCOPY WITH PROPOFOL;  Surgeon: Meryl Dare, MD;  Location: [facility];  Service: Gastroenterology;  Laterality: N/A;   CONDYLOMA EXCISION/FULGURATION N/A 06/09/2020   Procedure: EXCISION OF PENILE WARTS;  Surgeon: Sebastian Ache, MD;  Location: [facility];  Service: Urology;  Laterality: N/A;  45 MINS   ENDOVENOUS ABLATION SAPHENOUS VEIN W/ LASER Left 02/10/2016   endovenous laser ablation left greater saphenous vein and stab phlebectomy left leg by Gretta Began MD   INGUINAL HERNIA REPAIR Right 12/23/2009   @[facility]  by dr Jamey Ripa   LIPOMA EXCISION N/A 12/01/2016   Procedure: EXCISION POSTERIOR NECK MASS;  Surgeon: Glenna Fellows, MD;  Location: [facility];  Service: Plastics;  Laterality: N/A;   PENILE BIOPSY N/A 12/14/2021   Procedure: LOCAL EXCISION OF PENILE CANCER WITH PENILE BLOCK;  Surgeon: Sebastian Ache, MD;  Location: [facility];  Service: Urology;  Laterality: N/A;  1 HR   POLYPECTOMY  08/04/2021   Procedure: POLYPECTOMY;  Surgeon: Meryl Dare, MD;  Location: [facility];  Service: Gastroenterology;;   SHOULDER ARTHROSCOPY WITH SUBACROMIAL DECOMPRESSION, ROTATOR CUFF REPAIR AND BICEP TENDON REPAIR Left 10/2014    Social History   Socioeconomic History   Marital status: Married    Spouse name: Not on file   Number of children: 0    Years of education: BS   Highest education level: Not on file  Occupational History   Occupation: Psychologist, educational   Occupation: Art gallery manager  Tobacco Use   Smoking status: Former    Current packs/day: 0.00    Average packs/day: 0.5 packs/day for 2.0 years (1.0 ttl pk-yrs)    Types: Cigarettes    Start date: 09/05/1963    Quit date: 09/04/1965    Years since quitting: 57.5   Smokeless tobacco: Never   Tobacco comments:    quit around 78 yrs old.  Vaping Use   Vaping status: Never Used  Substance and Sexual Activity   Alcohol use: Yes    Alcohol/week: 20.0 standard drinks of alcohol    Types: 20 Standard drinks or equivalent per week    Comment: drink 1-2 at night    Drug use: Never   Sexual activity: Yes    Partners: Female  Other Topics Concern  Not on file  Social History Narrative   ** Merged History Encounter **       Originally from [place].   Trained as an Art gallery manager.    Married '75- 11 years, divorced; Married '86. No children.    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 Sculptor-life size figures    by commission (see work in [place]  Sculpting work regularly.   Never smoking.   Alcohol: drinks1- 2 beers and 1-2 wines every night;    Learning the piano. Patient get regular exercise.   Wife is a Engineer, civil (consulting).   Caffeine: 3/day    ADLs: drives; independent with ADLs   Advanced Directives: YES; FULL CODE; no prolonged measures.        Social Drivers of Corporate investment banker Strain: Low Risk  (07/10/2022)   Overall Financial Resource Strain (CARDIA)    Difficulty of Paying Living Expenses: Not hard at all  Food Insecurity: No Food Insecurity (07/10/2022)   Hunger Vital Sign    Worried About Running Out of Food in the Last Year: Never true    Ran Out of Food in the Last Year: Never true  Transportation Needs: No Transportation Needs (07/10/2022)   PRAPARE - Scientist, research (physical sciences) (Medical): No    Lack of Transportation (Non-Medical): No  Physical Activity: Sufficiently Active (07/10/2022)   Exercise Vital Sign    Days of Exercise per Week: 5 days    Minutes of Exercise per Session: 30 min  Stress: No Stress Concern Present (07/10/2022)   [employer] of Occupational Health - Occupational Stress Questionnaire    Feeling of Stress : Not at all  Social Connections: Moderately Isolated (07/10/2022)   Social Connection and Isolation Panel [NHANES]    Frequency of Communication with Friends and Family: Twice a week    Frequency of Social Gatherings with Friends and Family: Twice a week    Attends Religious Services: Never    Database administrator or Organizations: No    Attends Banker Meetings: Never    Marital Status: Married  Catering manager Violence: Not At Risk (07/10/2022)   Humiliation, Afraid, Rape, and Kick questionnaire    Fear of Current or Ex-Partner: No    Emotionally Abused: No    Physically Abused: No    Sexually Abused: No    Family History  Problem Relation Age of Onset   Cancer Father 3       stomach versus colon cancer   Pancreatic cancer Sister 39   Cancer Brother        penile cancer   Esophageal cancer Neg Hx    Throat cancer Neg Hx      Review of Systems  Constitutional: Negative.  Negative for chills and fever.  HENT:  Positive for congestion. Negative for sore throat.   Respiratory: Negative.  Negative for cough and shortness of breath.   Cardiovascular: Negative.  Negative for chest pain and palpitations.  Gastrointestinal:  Negative for abdominal pain, diarrhea, nausea and vomiting.  Genitourinary: Negative.  Negative for dysuria and hematuria.  Skin: Negative.  Negative for rash.  Neurological: Negative.  Negative for dizziness and headaches.  All other systems reviewed and are negative.   Today's Vitals   03/22/23 0804  BP: 124/74  Pulse: 73  Temp: 97.7 F (36.5 C)  TempSrc: Oral   SpO2: 96%  Weight: 187 lb (84.8 kg)  Height: 5\' 10"  (1.778 m)   Body mass index is 26.83 kg/m.   Physical Exam Vitals reviewed.  Constitutional:      Appearance: Normal appearance.  HENT:     Head: Normocephalic.     Mouth/Throat:     Mouth: Mucous membranes are moist.     Pharynx: Oropharynx is clear.  Eyes:     Extraocular Movements: Extraocular movements intact.     Conjunctiva/sclera: Conjunctivae normal.     Pupils: Pupils are equal, round, and reactive to light.  Cardiovascular:     Rate and Rhythm: Normal rate and regular rhythm.     Pulses: Normal pulses.     Heart sounds: Normal heart sounds.  Pulmonary:     Effort: Pulmonary effort is normal.     Breath sounds: Normal breath sounds.  Abdominal:     Palpations: Abdomen is soft.     Tenderness: There is no abdominal tenderness.  Skin:    General: Skin is warm and dry.  Neurological:     Mental Status: He is alert and oriented to person, place, and time.  Psychiatric:        Behavior: Behavior normal.      ASSESSMENT & PLAN: A total of 42 minutes was spent with the patient and counseling/coordination of care regarding preparing for this visit, review of most recent office visit notes, review of multiple chronic medical conditions and their management, cardiovascular risks associated with hypertension and dyslipidemia, review of all medications, review of most recent bloodwork results, review of health maintenance items, education on nutrition, prognosis, documentation, and need for follow up.   Problem List Items Addressed This Visit       Cardiovascular and Mediastinum   Essential hypertension - Primary   Well-controlled hypertension Continue lisinopril 40 mg daily and amlodipine 5 mg daily Cardiovascular risks associated with hypertension discussed      Relevant Orders   CBC with Differential/Platelet   PSA   Comprehensive metabolic panel   Hemoglobin A1c   Lipid panel     Other   Dyslipidemia    Relevant Orders   CBC with Differential/Platelet   PSA   Comprehensive metabolic panel   Hemoglobin A1c   Lipid panel   Situational depression   Situational intermittent symptoms.  Stable however. Stress and depression management discussed.      History of prostate cancer   Stable.  Has occasional minimal leakage. No concerns.  Follows up with urologist on a regular basis      Patient Instructions  Hypertension, Adult High blood pressure (hypertension) is when the force of blood pumping through the arteries is too strong. The arteries are the blood vessels that carry blood from the heart throughout the body. Hypertension forces the heart to work harder to pump blood and may cause arteries to become narrow or stiff. Untreated or uncontrolled hypertension can lead to a heart attack, heart failure, a stroke, kidney disease, and other problems. A blood pressure reading consists of a higher number over a lower number. Ideally, your blood pressure should be below 120/80. The first ("top") number is called the systolic pressure. It is a measure of the pressure in your arteries as your heart beats. The second ("bottom") number is called the diastolic pressure. It is a measure of the pressure in your arteries as the heart relaxes. What are the causes? The exact cause of this condition is not known. There are some conditions that result in high blood pressure. What increases the risk?  Certain factors may make you more likely to develop high blood pressure. Some of these risk factors are under your control, including: Smoking. Not getting enough exercise or physical activity. Being overweight. Having too much fat, sugar, calories, or salt (sodium) in your diet. Drinking too much alcohol. Other risk factors include: Having a personal history of heart disease, diabetes, high cholesterol, or kidney disease. Stress. Having a family history of high blood pressure and high cholesterol. Having  obstructive sleep apnea. Age. The risk increases with age. What are the signs or symptoms? High blood pressure may not cause symptoms. Very high blood pressure (hypertensive crisis) may cause: Headache. Fast or irregular heartbeats (palpitations). Shortness of breath. Nosebleed. Nausea and vomiting. Vision changes. Severe chest pain, dizziness, and seizures. How is this diagnosed? This condition is diagnosed by measuring your blood pressure while you are seated, with your arm resting on a flat surface, your legs uncrossed, and your feet flat on the floor. The cuff of the blood pressure monitor will be placed directly against the skin of your upper arm at the level of your heart. Blood pressure should be measured at least twice using the same arm. Certain conditions can cause a difference in blood pressure between your right and left arms. If you have a high blood pressure reading during one visit or you have normal blood pressure with other risk factors, you may be asked to: Return on a different day to have your blood pressure checked again. Monitor your blood pressure at home for 1 week or longer. If you are diagnosed with hypertension, you may have other blood or imaging tests to help your health care provider understand your overall risk for other conditions. How is this treated? This condition is treated by making healthy lifestyle changes, such as eating healthy foods, exercising more, and reducing your alcohol intake. You may be referred for counseling on a healthy diet and physical activity. Your health care provider may prescribe medicine if lifestyle changes are not enough to get your blood pressure under control and if: Your systolic blood pressure is above 130. Your diastolic blood pressure is above 80. Your personal target blood pressure may vary depending on your medical conditions, your age, and other factors. Follow these instructions at home: Eating and drinking  Eat a diet  that is high in fiber and potassium, and low in sodium, added sugar, and fat. An example of this eating plan is called the DASH diet. DASH stands for Dietary Approaches to Stop Hypertension. To eat this way: Eat plenty of fresh fruits and vegetables. Try to fill one half of your plate at each meal with fruits and vegetables. Eat whole grains, such as whole-wheat pasta, brown rice, or whole-grain bread. Fill about one fourth of your plate with whole grains. Eat or drink low-fat dairy products, such as skim milk or low-fat yogurt. Avoid fatty cuts of meat, processed or cured meats, and poultry with skin. Fill about one fourth of your plate with lean proteins, such as fish, chicken without skin, beans, eggs, or tofu. Avoid pre-made and processed foods. These tend to be higher in sodium, added sugar, and fat. Reduce your daily sodium intake. Many people with hypertension should eat less than 1,500 mg of sodium a day. Do not drink alcohol if: Your health care provider tells you not to drink. You are pregnant, may be pregnant, or are planning to become pregnant. If you drink alcohol: Limit how much you have to: 0-1 drink a day for  women. 0-2 drinks a day for men. Know how much alcohol is in your drink. In the [place] one drink equals one 12 oz bottle of beer (355 mL), one 5 oz glass of wine (148 mL), or one 1 oz glass of hard liquor (44 mL). Lifestyle  Work with your health care provider to maintain a healthy body weight or to lose weight. Ask what an ideal weight is for you. Get at least 30 minutes of exercise that causes your heart to beat faster (aerobic exercise) most days of the week. Activities may include walking, swimming, or biking. Include exercise to strengthen your muscles (resistance exercise), such as Pilates or lifting weights, as part of your weekly exercise routine. Try to do these types of exercises for 30 minutes at least 3 days a week. Do not use any products that contain nicotine or  tobacco. These products include cigarettes, chewing tobacco, and vaping devices, such as e-cigarettes. If you need help quitting, ask your health care provider. Monitor your blood pressure at home as told by your health care provider. Keep all follow-up visits. This is important. Medicines Take over-the-counter and prescription medicines only as told by your health care provider. Follow directions carefully. Blood pressure medicines must be taken as prescribed. Do not skip doses of blood pressure medicine. Doing this puts you at risk for problems and can make the medicine less effective. Ask your health care provider about side effects or reactions to medicines that you should watch for. Contact a health care provider if you: Think you are having a reaction to a medicine you are taking. Have headaches that keep coming back (recurring). Feel dizzy. Have swelling in your ankles. Have trouble with your vision. Get help right away if you: Develop a severe headache or confusion. Have unusual weakness or numbness. Feel faint. Have severe pain in your chest or abdomen. Vomit repeatedly. Have trouble breathing. These symptoms may be an emergency. Get help right away. Call 911. Do not wait to see if the symptoms will go away. Do not drive yourself to the hospital. Summary Hypertension is when the force of blood pumping through your arteries is too strong. If this condition is not controlled, it may put you at risk for serious complications. Your personal target blood pressure may vary depending on your medical conditions, your age, and other factors. For most people, a normal blood pressure is less than 120/80. Hypertension is treated with lifestyle changes, medicines, or a combination of both. Lifestyle changes include losing weight, eating a healthy, low-sodium diet, exercising more, and limiting alcohol. This information is not intended to replace advice given to you by your health care  provider. Make sure you discuss any questions you have with your health care provider. Document Revised: 11/16/2020 Document Reviewed: 11/16/2020 Elsevier Patient Education  2024 Elsevier Inc.     Edwina Barth, MD [facility]

## 2023-03-28 DIAGNOSIS — H6123 Impacted cerumen, bilateral: Secondary | ICD-10-CM | POA: Diagnosis not present

## 2023-04-03 ENCOUNTER — Ambulatory Visit: Attending: Emergency Medicine | Admitting: Physical Therapy

## 2023-04-03 ENCOUNTER — Other Ambulatory Visit: Payer: Self-pay

## 2023-04-03 DIAGNOSIS — M6281 Muscle weakness (generalized): Secondary | ICD-10-CM | POA: Diagnosis not present

## 2023-04-03 DIAGNOSIS — M419 Scoliosis, unspecified: Secondary | ICD-10-CM | POA: Insufficient documentation

## 2023-04-03 DIAGNOSIS — R2689 Other abnormalities of gait and mobility: Secondary | ICD-10-CM | POA: Diagnosis not present

## 2023-04-03 NOTE — Therapy (Signed)
 OUTPATIENT PHYSICAL THERAPY THORACOLUMBAR EVALUATION   Patient Name: Oscar Smith MRN: [id number] DOB:1945/12/29, 78 y.o., male Today's Date: 04/03/2023  END OF SESSION:  PT End of Session - 04/03/23 1443     Visit Number 1    Number of Visits 1    PT Start Time 1355    PT Stop Time 1435    PT Time Calculation (min) 40 min             Past Medical History:  Diagnosis Date   BPH with obstruction/lower urinary tract symptoms    urologist--- dr Berneice Heinrich   CAD (coronary artery disease)    cardiologist--- dr c. Bjorn Pippin;  per  note native artery;   nuclear stress test 12-13-2020  low risk w/ fixed perfusion defect apex w/ hypokinesis consistant w/ prior infart,  normal wall motion,  nuclear ef 54%;   coronary CT 01-05-2020  score= 767   ED (erectile dysfunction) of organic origin    First degree heart block    GERD (gastroesophageal reflux disease)    Hiatal hernia    History of colon polyps    2002 and 2003 hyperplastic   History of condyloma acuminatum    penis   History of diverticulitis of colon    History of gastritis    Hypertension    Left bundle branch block    Malignant neoplasm prostate [facility]) 11/2009   urologist--- dr Berneice Heinrich;   dx 11/ 2011,  gleason 3+3,  completed IMRT 03/ 2012   Penile cancer [facility])    urologist--- dr Berneice Heinrich ;    recurrent 11/ 2023   Wears glasses    Wears hearing aid in both ears    Past Surgical History:  Procedure Laterality Date   BIOPSY  08/04/2021   Procedure: BIOPSY;  Surgeon: Meryl Dare, MD;  Location: [facility];  Service: Gastroenterology;;   Alycia Patten N/A 06/09/2015   Procedure: CIRCUMCISION ADULT WITH PENILE BLOCK;  Surgeon: Sebastian Ache, MD;  Location: [facility];  Service: Urology;  Laterality: N/A;   COLONOSCOPY WITH PROPOFOL N/A 08/04/2021   Procedure: COLONOSCOPY WITH PROPOFOL;  Surgeon: Meryl Dare, MD;  Location: [facility];  Service: Gastroenterology;  Laterality: N/A;   CONDYLOMA  EXCISION/FULGURATION N/A 06/09/2020   Procedure: EXCISION OF PENILE WARTS;  Surgeon: Sebastian Ache, MD;  Location: [facility];  Service: Urology;  Laterality: N/A;  45 MINS   ENDOVENOUS ABLATION SAPHENOUS VEIN W/ LASER Left 02/10/2016   endovenous laser ablation left greater saphenous vein and stab phlebectomy left leg by Gretta Began MD   INGUINAL HERNIA REPAIR Right 12/23/2009   @[facility]  by dr Jamey Ripa   LIPOMA EXCISION N/A 12/01/2016   Procedure: EXCISION POSTERIOR NECK MASS;  Surgeon: Glenna Fellows, MD;  Location: [facility];  Service: Plastics;  Laterality: N/A;   PENILE BIOPSY N/A 12/14/2021   Procedure: LOCAL EXCISION OF PENILE CANCER WITH PENILE BLOCK;  Surgeon: Sebastian Ache, MD;  Location: [facility];  Service: Urology;  Laterality: N/A;  1 HR   POLYPECTOMY  08/04/2021   Procedure: POLYPECTOMY;  Surgeon: Meryl Dare, MD;  Location: [facility];  Service: Gastroenterology;;   SHOULDER ARTHROSCOPY WITH SUBACROMIAL DECOMPRESSION, ROTATOR CUFF REPAIR AND BICEP TENDON REPAIR Left 10/2014   Patient Active Problem List   Diagnosis Date Noted   History of prostate cancer 03/22/2023   Benign neoplasm of descending colon    Benign neoplasm of transverse colon    Situational depression 03/21/2021  Dyslipidemia 09/17/2019   History of diverticulosis 09/17/2019   Chronic sinus complaints 09/17/2019   Essential hypertension 07/03/2018   Pure hypercholesterolemia 07/03/2018   Osteoarthritis of right hip 02/07/2018   Enlarged prostate with urinary retention 12/25/2013   Erectile dysfunction 12/25/2013   Chronic sinusitis 02/20/2012   Frontal mucocele 02/20/2012   Nasal septal deviation 02/20/2012   ADENOCARCINOMA, PROSTATE, GLEASON GRADE 6 12/21/2009   GERD 11/12/2008    PCP: Georgina Quint, MD   REFERRING PROVIDER: Georgina Quint, MD   REFERRING DIAG: Scoliosis, unspecified scoliosis type, unspecified spinal region  [M41.9]   Rationale for Evaluation and Treatment: Rehabilitation  THERAPY DIAG:  Other abnormalities of gait and mobility  Muscle weakness (generalized)  ONSET DATE: congenital scoliosis  SUBJECTIVE:                                                                                                                                                                                           SUBJECTIVE STATEMENT: Eval statement 04/03/2023: pt just wants a one time visit to ensure he's doing the correct exercises to manage scoliosis and better his walking  overseas, is not interested in participating in a full POC.  PERTINENT HISTORY:  HTN, Prostate cancer  PAIN:  Are you having pain? No  PRECAUTIONS: None  RED FLAGS: None   WEIGHT BEARING RESTRICTIONS: No  FALLS:  Has patient fallen in last 6 months? No  LIVING ENVIRONMENT: Lives with: lives alone Lives in: House/apartment   OCCUPATION: retired  PLOF: Independent  PATIENT GOALS: "just want to ensure I'm doing the correct exercises"  NEXT MD VISIT: not scheduled  OBJECTIVE:  Note: Objective measures were completed at Evaluation unless otherwise noted.  PATIENT SURVEYS:  NI  COGNITION: Overall cognitive status: Within functional limits for tasks assessed     SENSATION: WFL  MUSCLE LENGTH: Thomas test: Right impaired; Left: Impaired, both have impaired length, no pain  POSTURE: rounded shoulders, forward head, and increased thoracic kyphosis  LUMBAR ROM:   AROM eval  Flexion   Extension   Right lateral flexion   Left lateral flexion   Right rotation   Left rotation    (Blank rows = not tested)  ! Indicates pain with testing  LOWER EXTREMITY ROM:     Active  Right eval Left eval  Hip flexion    Hip extension    Hip abduction    Hip adduction    Hip internal rotation    Hip external rotation    Knee flexion    Knee extension    Ankle dorsiflexion    Ankle plantarflexion    Ankle inversion  Ankle eversion     (Blank rows = not tested)  ! Indicates pain with testing  LOWER EXTREMITY MMT:    MMT Right eval Left eval  Hip flexion 4+ 4+  Hip extension 4 4  Hip abduction 4- 4-  Hip adduction    Hip internal rotation    Hip external rotation    Knee flexion 5 5  Knee extension 5 5  Ankle dorsiflexion    Ankle plantarflexion    Ankle inversion    Ankle eversion     (Blank rows = not tested)   ! Indicates pain with testing  FUNCTIONAL TESTS:  5 times sit to stand: 15.6  GAIT: Distance walked: 180ft Assistive device utilized: None Level of assistance: Complete Independence  [facility] Adult PT Treatment:                                                DATE: 04/03/2023  Self Care: Pt education POC discussion                                                                                                                                PATIENT EDUCATION:  Education details: Pt received education regarding HEP performance, ADL performance, functional activity tolerance, impairment education, appropriate performance of therapeutic activities.  Person educated: Patient Education method: Explanation, Demonstration, Tactile cues, Verbal cues, and Handouts Education comprehension: verbalized understanding and returned demonstration  HOME EXERCISE PROGRAM: Access Code: [email or web address] URL: [email or web address] Date: 04/03/2023 Prepared by: Sheliah Plane  Exercises - Sit to Stand with Resistance Around Legs  - 1 x daily - 4-7 x weekly - 2-3 sets - 10 reps - Side Stepping with Resistance at Thighs  - 1 x daily - 4-7 x weekly - 2-3 sets - 1 reps - 15 steps per leg hold - Supine Bridge with Resistance Band  - 1 x daily - 4-7 x weekly - 2-3 sets - 8-12 reps - 4s hold - Standard Plank  - 1 x daily - 4-7 x weekly - 1-3 sets - 1 reps - 1-55m hold - Bird Dog  - 1 x daily - 4-7 x weekly - 1-3 sets - 8-12 reps - 3s hold - Supine Lower Trunk Rotation  - 1 x daily - 4-7 x  weekly - 1-3 sets - 10 reps - 3s hold - seated Cross legged twist with breath hold  - 1 x daily - 4-7 x weekly - 1-2 sets - 1 reps - 28m hold - Walking  - 1 x daily - 7 x weekly - 1-2 sets - 1 reps - 75m hold  ASSESSMENT:  CLINICAL IMPRESSION: Eval impression (04/03/2023): Pt. attended today's physical therapy session for evaluation of scoliosis. Pt has no complaints of pain or dysfunction, just general fatigue with prolonged walking. Pt does not  want to attend therapy and just wants to have exercises to do at home to get better at walking alongside the ones he uses to manage scoliosis  Treatment performed today focused on pt education detailed in obj as well as formal HEP performance to ensure safety. All activities were completed independently with no signs of discomfort or safety concerns. Pt demonstrated great understanding of education provided. required minimal cues and no assistance for appropriate performance with today's activities. Pt requires the intervention of skilled outpatient physical therapy to address the aforementioned deficits and progress towards a functional level in line with therapeutic goals.    OBJECTIVE IMPAIRMENTS: decreased mobility, difficulty walking, and decreased strength.   ACTIVITY LIMITATIONS: locomotion level  PARTICIPATION LIMITATIONS: community activity  PERSONAL FACTORS: Age and 1 comorbidity: scoliosis  are also affecting patient's functional outcome.   REHAB POTENTIAL: Excellent  CLINICAL DECISION MAKING: Stable/uncomplicated  EVALUATION COMPLEXITY: Low   GOALS: Goals reviewed with patient? YES, One time visit  SHORT TERM GOALS: Target date: 04/03/2023  Pt will be independent with administered HEP to demonstrate the competency necessary for long term managemnet of symptoms at home. Baseline: Goal status: MET by end of initial session 04/03/2203   PLAN:  PT FREQUENCY: one time visit  PT DURATION: One time visit  PLANNED INTERVENTIONS:  One time visit    Sheliah Plane, PT, DPT 04/03/2023, 2:43 PM

## 2023-04-23 DIAGNOSIS — L910 Hypertrophic scar: Secondary | ICD-10-CM | POA: Diagnosis not present

## 2023-06-03 ENCOUNTER — Other Ambulatory Visit: Payer: Self-pay | Admitting: Emergency Medicine

## 2023-06-11 ENCOUNTER — Encounter: Payer: Self-pay | Admitting: Emergency Medicine

## 2023-06-12 ENCOUNTER — Other Ambulatory Visit: Payer: Self-pay | Admitting: Radiology

## 2023-06-12 DIAGNOSIS — H25813 Combined forms of age-related cataract, bilateral: Secondary | ICD-10-CM | POA: Diagnosis not present

## 2023-06-12 DIAGNOSIS — M419 Scoliosis, unspecified: Secondary | ICD-10-CM

## 2023-06-12 DIAGNOSIS — M549 Dorsalgia, unspecified: Secondary | ICD-10-CM

## 2023-06-12 NOTE — Telephone Encounter (Signed)
 Place referral to orthopedics please.  Thanks.

## 2023-06-13 DIAGNOSIS — Z85828 Personal history of other malignant neoplasm of skin: Secondary | ICD-10-CM | POA: Diagnosis not present

## 2023-06-13 DIAGNOSIS — D2362 Other benign neoplasm of skin of left upper limb, including shoulder: Secondary | ICD-10-CM | POA: Diagnosis not present

## 2023-06-19 DIAGNOSIS — R3912 Poor urinary stream: Secondary | ICD-10-CM | POA: Diagnosis not present

## 2023-06-28 ENCOUNTER — Encounter: Payer: Self-pay | Admitting: Physical Medicine and Rehabilitation

## 2023-06-28 ENCOUNTER — Ambulatory Visit: Admitting: Physical Medicine and Rehabilitation

## 2023-06-28 ENCOUNTER — Other Ambulatory Visit (INDEPENDENT_AMBULATORY_CARE_PROVIDER_SITE_OTHER)

## 2023-06-28 DIAGNOSIS — M5442 Lumbago with sciatica, left side: Secondary | ICD-10-CM

## 2023-06-28 DIAGNOSIS — M5416 Radiculopathy, lumbar region: Secondary | ICD-10-CM | POA: Diagnosis not present

## 2023-06-28 DIAGNOSIS — M5441 Lumbago with sciatica, right side: Secondary | ICD-10-CM

## 2023-06-28 DIAGNOSIS — M4126 Other idiopathic scoliosis, lumbar region: Secondary | ICD-10-CM | POA: Diagnosis not present

## 2023-06-28 DIAGNOSIS — G8929 Other chronic pain: Secondary | ICD-10-CM

## 2023-06-28 DIAGNOSIS — M47816 Spondylosis without myelopathy or radiculopathy, lumbar region: Secondary | ICD-10-CM | POA: Diagnosis not present

## 2023-06-28 DIAGNOSIS — H6123 Impacted cerumen, bilateral: Secondary | ICD-10-CM | POA: Diagnosis not present

## 2023-06-28 NOTE — Progress Notes (Signed)
 Pain Scale   Average Pain 2 Patient advising his lower back pain increases when standing and walking. Patient advising he has a Chronic lower back pain history.        +Driver, -BT, -Dye Allergies.

## 2023-06-28 NOTE — Progress Notes (Signed)
 Oscar Smith - 78 y.o. male MRN [id number]  Date of birth: 06/21/1945  Office Visit Note: Visit Date: 06/28/2023 PCP: Elvira Hammersmith, MD Referred by: Elvira Hammersmith, *  Subjective: Chief Complaint  Patient presents with   Lower Back - Pain   HPI: Oscar Smith is a 78 y.o. male who comes in today per the request of Dr. Miguel Sagardia for evaluation of chronic, worsening and severe bilateral lower back pain radiating to hips buttocks and anterior thighs, right greater than left. His wife Oscar Smith is a patient of ours. Pain ongoing for several years. His pain worsens with activity such as mowing and house work. Bending over and prolonged walking causes increased pain. He describes pain as sore and nagging sensation, currently rates as 6 out of 10. Some relief of pain with home exercise regimen, rest and use of medications. He does perform physician directed home exercise regimen almost daily. History of formal physical therapy evaluation in March, he is not scheduled to return at this time. Lumbar radiographs from 2018 shows lumbar scoliosis concave right with diffuse degenerative change. No prior lumbar MRI imaging. Right hip radiographs from 2020 does show mild to moderate degenerative changes to right hip. No history of lumbar injections/surgery. Patient denies focal weakness, numbness and tingling. No recent trauma or falls.      Review of Systems  Musculoskeletal:  Positive for back pain.  Neurological:  Negative for tingling, sensory change, focal weakness and weakness.  All other systems reviewed and are negative.  Otherwise per HPI.  Assessment & Plan: Visit Diagnoses:    ICD-10-CM   1. Chronic bilateral low back pain with bilateral sciatica  M54.42 XR Lumbar Spine 2-3 Views   M54.41 MR LUMBAR SPINE WO CONTRAST   G89.29     2. Lumbar radiculopathy  M54.16 MR LUMBAR SPINE WO CONTRAST    3. Facet arthropathy, lumbar  M47.816 MR LUMBAR SPINE WO  CONTRAST    4. Other idiopathic scoliosis, lumbar region  M41.26 MR LUMBAR SPINE WO CONTRAST       Plan: Findings:  Chronic, worsening and severe bilateral lower back pain radiating to hips buttocks and anterior thighs, right greater than left. Patient continues to have severe pain despite good conservative therapies such as formal physical therapy, home exercise regimen, rest and use of medications. Patients clinical presentation and exam are complex, differentials include lumbar radiculopathy vs facet mediated pain. I am also concerned about possible central canal stenosis as he does have symptoms of claudication with prolonged standing and walking and fairly significant degenerative scoliotic curvature. I obtained lumbar radiographs in the office today that show levoscoliosis, multi level degenerative changes and severe facet arthropathy to lower lumbar spine. I discussed treatment plan in detail today. Next step is to obtain lumbar MRI imaging. Depending on results of MRI imaging we discussed possibility of performing lumbar injections. Patient did inquire about surgical correction of scoliosis. I informed him that often times corrective surgery for scoliosis is complex and requires multi procedures, do not think he would be a candidate given his advanced age. He has no questions at this time. His exam today is non focal, good strength noted to bilateral lower extremities. No red flag symptoms noted.     Meds & Orders: No orders of the defined types were placed in this encounter.   Orders Placed This Encounter  Procedures   XR Lumbar Spine 2-3 Views   MR LUMBAR SPINE WO CONTRAST  Follow-up: Return for Lumbar MRI review.   Procedures: No procedures performed      Clinical History: No specialty comments available.   He reports that he quit smoking about 57 years ago. His smoking use included cigarettes. He started smoking about 59 years ago. He has a 1 pack-year smoking history. He has  never used smokeless tobacco.  Recent Labs    09/19/22 0843 03/22/23 0838  HGBA1C 5.1 5.3    Objective:  VS:  HT:    WT:   BMI:     BP:   HR: bpm  TEMP: ( )  RESP:  Physical Exam Vitals and nursing note reviewed.  HENT:     Head: Normocephalic and atraumatic.     Right Ear: External ear normal.     Left Ear: External ear normal.     Nose: Nose normal.     Mouth/Throat:     Mouth: Mucous membranes are moist.  Eyes:     Extraocular Movements: Extraocular movements intact.  Cardiovascular:     Rate and Rhythm: Normal rate.     Pulses: Normal pulses.  Pulmonary:     Effort: Pulmonary effort is normal.  Abdominal:     General: Abdomen is flat. There is no distension.  Musculoskeletal:        General: Tenderness present.     Cervical back: Normal range of motion.     Comments: Patient is slow to rise from seated position to standing. Good lumbar range of motion. No pain noted with facet loading. 5/5 strength noted with bilateral hip flexion, knee flexion/extension, ankle dorsiflexion/plantarflexion and EHL. No clonus noted bilaterally. No pain upon palpation of greater trochanters. No pain with internal/external rotation of bilateral hips. Sensation intact bilaterally. Negative slump test bilaterally. Ambulates without aid, gait steady.     Skin:    General: Skin is warm and dry.     Capillary Refill: Capillary refill takes less than 2 seconds.  Neurological:     General: No focal deficit present.     Mental Status: He is alert and oriented to person, place, and time.  Psychiatric:        Mood and Affect: Mood normal.        Behavior: Behavior normal.     Ortho Exam  Imaging: No results found.   Past Medical/Family/Surgical/Social History: Medications & Allergies reviewed per EMR, new medications updated. Patient Active Problem List   Diagnosis Date Noted   History of prostate cancer 03/22/2023   Benign neoplasm of descending colon    Benign neoplasm of  transverse colon    Situational depression 03/21/2021   Dyslipidemia 09/17/2019   History of diverticulosis 09/17/2019   Chronic sinus complaints 09/17/2019   Essential hypertension 07/03/2018   Pure hypercholesterolemia 07/03/2018   Osteoarthritis of right hip 02/07/2018   Enlarged prostate with urinary retention 12/25/2013   Erectile dysfunction 12/25/2013   Chronic sinusitis 02/20/2012   Frontal mucocele 02/20/2012   Nasal septal deviation 02/20/2012   ADENOCARCINOMA, PROSTATE, GLEASON GRADE 6 12/21/2009   GERD 11/12/2008   Past Medical History:  Diagnosis Date   BPH with obstruction/lower urinary tract symptoms    urologist--- dr Secundino Dach   CAD (coronary artery disease)    cardiologist--- dr c. Alda Amas;  per  note native artery;   nuclear stress test 12-13-2020  low risk w/ fixed perfusion defect apex w/ hypokinesis consistant w/ prior infart,  normal wall motion,  nuclear ef 54%;   coronary CT 01-05-2020  score= 767  ED (erectile dysfunction) of organic origin    First degree heart block    GERD (gastroesophageal reflux disease)    Hiatal hernia    History of colon polyps    2002 and 2003 hyperplastic   History of condyloma acuminatum    penis   History of diverticulitis of colon    History of gastritis    Hypertension    Left bundle branch block    Malignant neoplasm prostate [facility]) 11/2009   urologist--- dr Secundino Dach;   dx 11/ 2011,  gleason 3+3,  completed IMRT 03/ 2012   Penile cancer [facility])    urologist--- dr Secundino Dach ;    recurrent 11/ 2023   Wears glasses    Wears hearing aid in both ears    Family History  Problem Relation Age of Onset   Cancer Father 48       stomach versus colon cancer   Pancreatic cancer Sister 76   Cancer Brother        penile cancer   Esophageal cancer Neg Hx    Throat cancer Neg Hx    Past Surgical History:  Procedure Laterality Date   BIOPSY  08/04/2021   Procedure: BIOPSY;  Surgeon: Asencion Blacksmith, MD;  Location: [facility];   Service: Gastroenterology;;   CIRCUMCISION N/A 06/09/2015   Procedure: CIRCUMCISION ADULT WITH PENILE BLOCK;  Surgeon: Osborn Blaze, MD;  Location[facility];  Service: Urology;  Laterality: N/A;   COLONOSCOPY WITH PROPOFOL  N/A 08/04/2021   Procedure: COLONOSCOPY WITH PROPOFOL ;  Surgeon: Asencion Blacksmith, MD;  Location: [facility];  Service: Gastroenterology;  Laterality: N/A;   CONDYLOMA EXCISION/FULGURATION N/A 06/09/2020   Procedure: EXCISION OF PENILE WARTS;  Surgeon: Osborn Blaze, MD;  Location[facility];  Service: Urology;  Laterality: N/A;  45 MINS   ENDOVENOUS ABLATION SAPHENOUS VEIN W/ LASER Left 02/10/2016   endovenous laser ablation left greater saphenous vein and stab phlebectomy left leg by Ouida Bloom MD   INGUINAL HERNIA REPAIR Right 12/23/2009   @[facility]  by dr Linell Rhymes   LIPOMA EXCISION N/A 12/01/2016   Procedure: EXCISION POSTERIOR NECK MASS;  Surgeon: Alger Infield, MD;  Location: [facility];  Service: Plastics;  Laterality: N/A;   PENILE BIOPSY N/A 12/14/2021   Procedure: LOCAL EXCISION OF PENILE CANCER WITH PENILE BLOCK;  Surgeon: Osborn Blaze, MD;  [facility];  Service: Urology;  Laterality: N/A;  1 HR   POLYPECTOMY  08/04/2021   Procedure: POLYPECTOMY;  Surgeon: Asencion Blacksmith, MD;  Location: [facility];  Service: Gastroenterology;;   SHOULDER ARTHROSCOPY WITH SUBACROMIAL DECOMPRESSION, ROTATOR CUFF REPAIR AND BICEP TENDON REPAIR Left 10/2014   Social History   Occupational History   Occupation: Psychologist, educational   Occupation: Art gallery manager  Tobacco Use   Smoking status: Former    Current packs/day: 0.00    Average packs/day: 0.5 packs/day for 2.0 years (1.0 ttl pk-yrs)    Types: Cigarettes    Start date: 09/05/1963    Quit date: 09/04/1965    Years since quitting: 57.8   Smokeless tobacco: Never   Tobacco comments:    quit around 78 yrs old.  Vaping Use   Vaping status: Never Used   Substance and Sexual Activity   Alcohol use: Yes    Alcohol/week: 20.0 standard drinks of alcohol    Types: 20 Standard drinks or equivalent per week    Comment: drink 1-2 at night    Drug use: Never   Sexual  activity: Yes    Partners: Female

## 2023-06-29 ENCOUNTER — Encounter: Payer: Self-pay | Admitting: Emergency Medicine

## 2023-06-29 ENCOUNTER — Other Ambulatory Visit: Payer: Self-pay | Admitting: Emergency Medicine

## 2023-06-29 DIAGNOSIS — F418 Other specified anxiety disorders: Secondary | ICD-10-CM

## 2023-06-29 MED ORDER — LORAZEPAM 1 MG PO TABS: 1.0000 mg | ORAL_TABLET | Freq: Every day | ORAL | 1 refills | Status: AC | PRN

## 2023-07-14 ENCOUNTER — Other Ambulatory Visit: Payer: Self-pay | Admitting: Emergency Medicine

## 2023-07-14 ENCOUNTER — Ambulatory Visit
Admission: RE | Admit: 2023-07-14 | Discharge: 2023-07-14 | Disposition: A | Discharge status: Home / Self Care | Source: Ambulatory Visit | Attending: Physical Medicine and Rehabilitation | Admitting: Physical Medicine and Rehabilitation

## 2023-07-14 DIAGNOSIS — M4126 Other idiopathic scoliosis, lumbar region: Secondary | ICD-10-CM

## 2023-07-14 DIAGNOSIS — M48061 Spinal stenosis, lumbar region without neurogenic claudication: Secondary | ICD-10-CM | POA: Diagnosis not present

## 2023-07-14 DIAGNOSIS — M47816 Spondylosis without myelopathy or radiculopathy, lumbar region: Secondary | ICD-10-CM | POA: Diagnosis not present

## 2023-07-14 DIAGNOSIS — M419 Scoliosis, unspecified: Secondary | ICD-10-CM | POA: Diagnosis not present

## 2023-07-14 DIAGNOSIS — G8929 Other chronic pain: Secondary | ICD-10-CM

## 2023-07-14 DIAGNOSIS — M5416 Radiculopathy, lumbar region: Secondary | ICD-10-CM

## 2023-07-16 ENCOUNTER — Ambulatory Visit: Payer: Medicare Other

## 2023-07-23 ENCOUNTER — Ambulatory Visit (INDEPENDENT_AMBULATORY_CARE_PROVIDER_SITE_OTHER)

## 2023-07-23 VITALS — Ht 70.0 in | Wt 184.0 lb

## 2023-07-23 DIAGNOSIS — Z0000 Encounter for general adult medical examination without abnormal findings: Secondary | ICD-10-CM

## 2023-07-23 NOTE — Patient Instructions (Addendum)
 Mr. Oscar Smith , Thank you for taking time out of your busy schedule to complete your Annual Wellness Visit with me. I enjoyed our conversation and look forward to speaking with you again next year. I, as well as your care team,  appreciate your ongoing commitment to your health goals. Please review the following plan we discussed and let me know if I can assist you in the future. Your Game plan/ To Do List    Follow up Visits: Next Medicare AWV with our clinical staff: 09/26/2024   Have you seen your provider in the last 6 months (3 months if uncontrolled diabetes)? Yes Next Office Visit with your provider: 09/19/2023   Clinician Recommendations:  Aim for 30 minutes of exercise or brisk walking, 6-8 glasses of water, and 5 servings of fruits and vegetables each day.       This is a list of the screening recommended for you and due dates:  Health Maintenance  Topic Date Due   COVID-19 Vaccine (4 - 2024-25 season) 09/24/2022   Flu Shot  08/24/2023   Medicare Annual Wellness Visit  07/22/2024   DTaP/Tdap/Td vaccine (3 - Td or Tdap) 03/21/2033   Pneumococcal Vaccine for age over 62  Completed   Zoster (Shingles) Vaccine  Completed   Hepatitis B Vaccine  Aged Out   HPV Vaccine  Aged Out   Meningitis B Vaccine  Aged Out   Colon Cancer Screening  Discontinued   Hepatitis C Screening  Discontinued    Advanced directives: (In Chart) A copy of your advanced directives are scanned into your chart should your provider ever need it. Advance Care Planning is important because it:  [x]  Makes sure you receive the medical care that is consistent with your values, goals, and preferences  [x]  It provides guidance to your family and loved ones and reduces their decisional burden about whether or not they are making the right decisions based on your wishes.  Follow the link provided in your after visit summary or read over the paperwork we have mailed to you to help you started getting your Advance  Directives in place. If you need assistance in completing these, please reach out to us  so that we can help you!

## 2023-07-23 NOTE — Progress Notes (Signed)
 Subjective:   Oscar Smith is a 78 y.o. who presents for a Medicare Wellness preventive visit.  As a reminder, Annual Wellness Visits don't include a physical exam, and some assessments may be limited, especially if this visit is performed virtually. We may recommend an in-person follow-up visit with your provider if needed.  Visit Complete: Virtual I connected with  Oscar Smith on 07/23/23 by a audio enabled telemedicine application and verified that I am speaking with the correct person using two identifiers.  Patient Location: Home  Provider Location: Office/Clinic  I discussed the limitations of evaluation and management by telemedicine. The patient expressed understanding and agreed to proceed.  Vital Signs: Because this visit was a virtual/telehealth visit, some criteria may be missing or patient reported. Any vitals not documented were not able to be obtained and vitals that have been documented are patient reported.  VideoDeclined- This patient declined Librarian, academic. Therefore the visit was completed with audio only.  Persons Participating in Visit: Patient.  AWV Questionnaire: Yes: Patient Medicare AWV questionnaire was completed by the patient on 07/19/2023 (partially); I have confirmed that all information answered by patient is correct and no changes since this date.  Cardiac Risk Factors include: advanced age (>34men, >41 women);dyslipidemia;hypertension;male gender     Objective:    Today's Vitals   07/23/23 1013  Weight: 184 lb (83.5 kg)  Height: 5' 10 (1.778 m)   Body mass index is 26.4 kg/m.     07/23/2023   10:13 AM 04/03/2023    2:02 PM 07/10/2022   10:35 AM 12/14/2021   12:26 PM 08/04/2021    6:52 AM 07/08/2021    8:24 AM 06/09/2020   12:34 PM  Advanced Directives  Does Patient Have a Medical Advance Directive? Yes No Yes Yes Yes Yes Yes  Type of Estate agent of [place];Living  will  Healthcare Power of [place];Living will  Healthcare Power of [place];Living will Healthcare Power of [place];Living will Living will  Does patient want to make changes to medical advance directive? No - Patient declined  No - Patient declined No - Patient declined   No - Patient declined  Copy of Healthcare Power of Attorney in Chart? Yes - validated most recent copy scanned in chart (See row information)  Yes - validated most recent copy scanned in chart (See row information)   No - copy requested   Would patient like information on creating a medical advance directive?  No - Patient declined         Current Medications (verified) Outpatient Encounter Medications as of 07/23/2023  Medication Sig   ABRYSVO 120 MCG/0.5ML injection Inject 0.5 mLs into the muscle once.   amLODipine  (NORVASC ) 5 MG tablet TAKE 1 TABLET BY MOUTH TWICE DAILY.   aspirin EC 81 MG tablet Take 81 mg daily by mouth.   atorvastatin  (LIPITOR) 20 MG tablet TAKE ONE TABLET ONCE DAILY   diphenhydramine-acetaminophen  (TYLENOL  PM) 25-500 MG TABS tablet Take 1 tablet by mouth at bedtime as needed (for sleep).   esomeprazole  (NEXIUM ) 40 MG capsule Take 1 capsule (40 mg total) by mouth daily.   fluticasone  (FLONASE ) 50 MCG/ACT nasal spray Place 2 sprays into both nostrils daily as needed for allergies or rhinitis.   FLUZONE HIGH-DOSE 0.5 ML injection Inject 0.5 mLs into the muscle once.   hydrocortisone 2.5 % cream Apply topically as needed.   lisinopril  (ZESTRIL ) 40 MG tablet TAKE ONE TABLET BY MOUTH DAILY  LORazepam  (ATIVAN ) 1 MG tablet Take 1 tablet (1 mg total) by mouth daily as needed for anxiety.   Multiple Vitamin (MULTIVITAMIN) tablet Take 1 tablet by mouth daily.   SPIKEVAX syringe Inject 0.5 mLs into the muscle once.   [DISCONTINUED] azithromycin  (ZITHROMAX ) 250 MG tablet Sig as indicated   No facility-administered encounter medications on file as of 07/23/2023.    Allergies (verified) Flomax [tamsulosin hcl]  and Latex   History: Past Medical History:  Diagnosis Date   BPH with obstruction/lower urinary tract symptoms    urologist--- dr alvaro   CAD (coronary artery disease)    cardiologist--- dr c. kate;  per  note native artery;   nuclear stress test 12-13-2020  low risk w/ fixed perfusion defect apex w/ hypokinesis consistant w/ prior infart,  normal wall motion,  nuclear ef 54%;   coronary CT 01-05-2020  score= 767   ED (erectile dysfunction) of organic origin    First degree heart block    GERD (gastroesophageal reflux disease)    Hiatal hernia    History of colon polyps    2002 and 2003 hyperplastic   History of condyloma acuminatum    penis   History of diverticulitis of colon    History of gastritis    Hypertension    Left bundle branch block    Malignant neoplasm prostate [facility]) 11/2009   urologist--- dr alvaro;   dx 11/ 2011,  gleason 3+3,  completed IMRT 03/ 2012   Penile cancer [facility])    urologist--- dr alvaro ;    recurrent 11/ 2023   Wears glasses    Wears hearing aid in both ears    Past Surgical History:  Procedure Laterality Date   BIOPSY  08/04/2021   Procedure: BIOPSY;  Surgeon: Aneita Gwendlyn DASEN, MD;  Location: [facility];  Service: Gastroenterology;;   BAILEY N/A 06/09/2015   Procedure: CIRCUMCISION ADULT WITH PENILE BLOCK;  Surgeon: Ricardo alvaro, MD;  Location: [facility];  Service: Urology;  Laterality: N/A;   COLONOSCOPY WITH PROPOFOL  N/A 08/04/2021   Procedure: COLONOSCOPY WITH PROPOFOL ;  Surgeon: Aneita Gwendlyn DASEN, MD;  Location: [facility];  Service: Gastroenterology;  Laterality: N/A;   CONDYLOMA EXCISION/FULGURATION N/A 06/09/2020   Procedure: EXCISION OF PENILE WARTS;  Surgeon: alvaro Ricardo, MD;  Location: [facility];  Service: Urology;  Laterality: N/A;  45 MINS   ENDOVENOUS ABLATION SAPHENOUS VEIN W/ LASER Left 02/10/2016   endovenous laser ablation left greater saphenous vein and stab phlebectomy left leg  by Krystal Doing MD   INGUINAL HERNIA REPAIR Right 12/23/2009   @[facility]  by dr merrilyn   LIPOMA EXCISION N/A 12/01/2016   Procedure: EXCISION POSTERIOR NECK MASS;  Surgeon: Arelia Filippo, MD;  Location: [facility];  Service: Plastics;  Laterality: N/A;   PENILE BIOPSY N/A 12/14/2021   Procedure: LOCAL EXCISION OF PENILE CANCER WITH PENILE BLOCK;  Surgeon: alvaro Ricardo, MD;  Location: [facility];  Service: Urology;  Laterality: N/A;  1 HR   POLYPECTOMY  08/04/2021   Procedure: POLYPECTOMY;  Surgeon: Aneita Gwendlyn DASEN, MD;  Location: [facility];  Service: Gastroenterology;;   SHOULDER ARTHROSCOPY WITH SUBACROMIAL DECOMPRESSION, ROTATOR CUFF REPAIR AND BICEP TENDON REPAIR Left 10/2014   Family History  Problem Relation Age of Onset   Cancer Father 58       stomach versus colon cancer   Pancreatic cancer Sister 42   Cancer Brother        penile cancer  Esophageal cancer Neg Hx    Throat cancer Neg Hx    Social History   Socioeconomic History   Marital status: Married    Spouse name: Not on file   Number of children: 0   Years of education: BS   Highest education level: Bachelor's degree (e.g., BA, AB, BS)  Occupational History   Occupation: Psychologist, educational   OccupationEducation officer, environmental  Tobacco Use   Smoking status: Former    Current packs/day: 0.00    Average packs/day: 0.5 packs/day for 2.0 years (1.0 ttl pk-yrs)    Types: Cigarettes    Start date: 09/05/1963    Quit date: 09/04/1965    Years since quitting: 57.9   Smokeless tobacco: Never   Tobacco comments:    quit around 78 yrs old.  Vaping Use   Vaping status: Never Used  Substance and Sexual Activity   Alcohol use: Not Currently    Alcohol/week: 2.0 standard drinks of alcohol    Types: 1 Glasses of wine, 1 Cans of beer per week    Comment: drink 1-2 at night    Drug use: Never   Sexual activity: Yes    Partners: Female  Other Topics Concern   Not on file  Social History Narrative   ** Merged  History Encounter **       Originally from [place].   Trained as an Art gallery manager.    Married '75- 11 years, divorced; Married '86. No children.    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 Sculptor-life size figures    by commission (see work in [place]  Sculpting work regularly.   Never smoking.   Alcohol: drinks1- 2 beers and 1-2 wines every night;    Learning the piano. Patient get regular exercise.   Wife is a Engineer, civil (consulting).   Caffeine: 3/day    ADLs: drives; independent with ADLs   Advanced Directives: YES; FULL CODE; no prolonged measures.        Social Drivers of Corporate investment banker Strain: Low Risk  (07/23/2023)   Overall Financial Resource Strain (CARDIA)    Difficulty of Paying Living Expenses: Not very hard  Food Insecurity: No Food Insecurity (07/23/2023)   Hunger Vital Sign    Worried About Running Out of Food in the Last Year: Never true    Ran Out of Food in the Last Year: Never true  Transportation Needs: No Transportation Needs (07/23/2023)   PRAPARE - Administrator, Civil Service (Medical): No    Lack of Transportation (Non-Medical): No  Physical Activity: Insufficiently Active (07/23/2023)   Exercise Vital Sign    Days of Exercise per Week: 3 days    Minutes of Exercise per Session: 30 min  Stress: Stress Concern Present (07/23/2023)   [employer] of Occupational Health - Occupational Stress Questionnaire    Feeling of Stress: To some extent  Social Connections: Socially Isolated (07/23/2023)   Social Connection and Isolation Panel    Frequency of Communication with Friends and Family: Once a week    Frequency of Social Gatherings with Friends and Family: Never    Attends Religious Services: Never    Database administrator or Organizations: No    Attends Engineer, structural: Never    Marital Status: Married    Tobacco Counseling Counseling given:  No Tobacco comments: quit around 78 yrs old.    Clinical Intake:  Pre-visit preparation completed: Yes  Pain : No/denies pain     BMI - recorded: 26.4 Nutritional Status: BMI 25 -29 Overweight Nutritional Risks: None Diabetes: No  Lab Results  Component Value Date   HGBA1C 5.3 03/22/2023   HGBA1C 5.1 09/19/2022   HGBA1C 5.2 03/28/2022     How often do you need to have someone help you when you read instructions, pamphlets, or other written materials from your doctor or pharmacy?: 1 - Never  Interpreter Needed?: No  Information entered by :: Verdie Saba, CMA   Activities of Daily Living     07/23/2023   10:20 AM 07/19/2023    9:14 AM  In your present state of health, do you have any difficulty performing the following activities:  Hearing? 1 1  Comment wears hearing aids; getting adjusted to the new hearing aids   Vision? 1 1  Comment currently seeing Dr Robinson - may have cataract surgery   Difficulty concentrating or making decisions? 0 0  Walking or climbing stairs? 0 0  Dressing or bathing? 0 0  Doing errands, shopping? 0 0  Preparing Food and eating ? N N  Using the Toilet? N N  In the past six months, have you accidently leaked urine? CINDERELLA CINDERELLA  Comment wears a pad/pull up   Do you have problems with loss of bowel control? Y Y  Comment wears a pad/pull up   Managing your Medications? N N  Managing your Finances? N N  Housekeeping or managing your Housekeeping? N N    Patient Care Team: Purcell Emil Schanz, MD as PCP - General (Internal Medicine) Claudene Rayfield HERO, MD as Consulting Physician (Family Medicine) Kristie Lamprey, MD as Consulting Physician (Gastroenterology) Robinson Idol, MD as Consulting Physician (Ophthalmology) Encarnacion Norleen Lenis, DMD as Consulting Physician (Dentistry) Claudene Rayfield HERO, MD (Family Medicine) Alvaro Ricardo KATHEE Raddle., MD as Consulting Physician (Urology)  I have updated your Care Teams any recent Medical Services you may have  received from other providers in the past year.     Assessment:   This is a routine wellness examination for Joesph.  Hearing/Vision screen Hearing Screening - Comments:: wears hearing aids; getting adjusted to the new hearing aids Vision Screening - Comments:: Wears rx glasses - up to date with routine eye exams with Dr Robinson   Goals Addressed               This Visit's Progress     Patient Stated (pt-stated)        Patient stated he plans to continue exercising and lose weight (about 10lbs) and watch diet more       Depression Screen     07/23/2023   10:24 AM 03/22/2023    8:09 AM 01/11/2023   10:56 AM 07/10/2022   10:37 AM 03/28/2022    8:07 AM 09/27/2021    1:03 PM 07/08/2021    8:25 AM  PHQ 2/9 Scores  PHQ - 2 Score 0 1 1 0 0 0 0  PHQ- 9 Score 0   0       Fall Risk     07/23/2023   10:23 AM 07/19/2023    9:14 AM 03/22/2023    8:09 AM 01/11/2023   10:56 AM 09/19/2022    8:10 AM  Fall Risk   Falls in the past year? 0 1 0 0 0  Comment confirmed with patient - no falls      Number falls in past yr: 0  0 0 0  Injury with  Fall? 0  0 0 0  Risk for fall due to : No Fall Risks  No Fall Risks No Fall Risks No Fall Risks  Follow up Falls evaluation completed;Falls prevention discussed  Falls evaluation completed Falls evaluation completed Falls evaluation completed    MEDICARE RISK AT HOME:  Medicare Risk at Home Any stairs in or around the home?: Yes If so, are there any without handrails?: No Home free of loose throw rugs in walkways, pet beds, electrical cords, etc?: No Adequate lighting in your home to reduce risk of falls?: Yes Life alert?: No Use of a cane, walker or w/c?: No Grab bars in the bathroom?: No Shower chair or bench in shower?: No Elevated toilet seat or a handicapped toilet?: Yes  TIMED UP AND GO:  Was the test performed?  No  Cognitive Function: 6CIT completed        07/23/2023   10:31 AM 07/10/2022   10:37 AM 07/14/2019    9:04 AM  07/10/2018    9:11 AM 09/26/2016    3:38 PM  6CIT Screen  What Year? 0 points 0 points 0 points 0 points 0 points  What month? 0 points 0 points 0 points 0 points 0 points  What time? 0 points 0 points 0 points 0 points 0 points  Count back from 20 0 points 0 points 0 points 0 points 0 points  Months in reverse 0 points 0 points 0 points 0 points 2 points  Repeat phrase 0 points 0 points 0 points 0 points 0 points  Total Score 0 points 0 points 0 points 0 points 2 points    Immunizations Immunization History  Administered Date(s) Administered   Fluad Quad(high Dose 65+) 09/18/2018, 10/29/2019   Influenza Split 10/21/2010, 10/18/2011   Influenza, High Dose Seasonal PF 10/31/2017   Influenza,inj,Quad PF,6+ Mos 11/01/2012, 09/25/2013, 11/02/2015, 11/20/2016   Influenza-Unspecified 10/23/2016, 08/25/2022   MMR 02/13/2013   PFIZER(Purple Top)SARS-COV-2 Vaccination 02/27/2019, 03/24/2019   Pneumococcal Conjugate-13 06/22/2015   Pneumococcal Polysaccharide-23 08/21/2008   Tdap 01/30/2013, 03/22/2023   Unspecified SARS-COV-2 Vaccination 08/08/2021   Zoster Recombinant(Shingrix) 04/09/2018, 07/04/2018   Zoster, Live 10/21/2010    Screening Tests Health Maintenance  Topic Date Due   COVID-19 Vaccine (4 - 2024-25 season) 09/24/2022   INFLUENZA VACCINE  08/24/2023   Medicare Annual Wellness (AWV)  07/22/2024   DTaP/Tdap/Td (3 - Td or Tdap) 03/21/2033   Pneumococcal Vaccine: 50+ Years  Completed   Zoster Vaccines- Shingrix  Completed   Hepatitis B Vaccines  Aged Out   HPV VACCINES  Aged Out   Meningococcal B Vaccine  Aged Out   Colonoscopy  Discontinued   Hepatitis C Screening  Discontinued    Health Maintenance  Health Maintenance Due  Topic Date Due   COVID-19 Vaccine (4 - 2024-25 season) 09/24/2022   Health Maintenance Items Addressed: 07/23/2023   Additional Screening:  Vision Screening: Recommended annual ophthalmology exams for early detection of glaucoma and other  disorders of the eye. Would you like a referral to an eye doctor? No  Pt stated he's seen Dr Robinson for an eye exam in 05/2023.  Dental Screening: Recommended annual dental exams for proper oral hygiene  Community Resource Referral / Chronic Care Management: CRR required this visit?  No   CCM required this visit?  No   Plan:    I have personally reviewed and noted the following in the patient's chart:   Medical and social history Use of alcohol, tobacco or illicit drugs  Current medications and supplements including opioid prescriptions. Patient is not currently taking opioid prescriptions. Functional ability and status Nutritional status Physical activity Advanced directives List of other physicians Hospitalizations, surgeries, and ER visits in previous 12 months Vitals Screenings to include cognitive, depression, and falls Referrals and appointments  In addition, I have reviewed and discussed with patient certain preventive protocols, quality metrics, and best practice recommendations. A written personalized care plan for preventive services as well as general preventive health recommendations were provided to patient.   Verdie CHRISTELLA Saba, CMA   07/23/2023   After Visit Summary: (MyChart) Due to this being a telephonic visit, the after visit summary with patients personalized plan was offered to patient via MyChart   Notes: Nothing significant to report at this time.

## 2023-07-25 ENCOUNTER — Ambulatory Visit: Payer: Self-pay | Admitting: Physical Medicine and Rehabilitation

## 2023-07-25 NOTE — Telephone Encounter (Signed)
 Needs OV to discuss MRI, he has severe stenosis at L4-5 that may fit more with his pain than the known scoliosis

## 2023-07-25 NOTE — Telephone Encounter (Signed)
 LMX1 for patient to schedule OV with Megan for MRI Review

## 2023-07-30 ENCOUNTER — Encounter: Payer: Self-pay | Admitting: Physical Medicine and Rehabilitation

## 2023-07-30 ENCOUNTER — Ambulatory Visit: Admitting: Physical Medicine and Rehabilitation

## 2023-07-30 DIAGNOSIS — M48062 Spinal stenosis, lumbar region with neurogenic claudication: Secondary | ICD-10-CM

## 2023-07-30 DIAGNOSIS — M47816 Spondylosis without myelopathy or radiculopathy, lumbar region: Secondary | ICD-10-CM | POA: Diagnosis not present

## 2023-07-30 DIAGNOSIS — G8929 Other chronic pain: Secondary | ICD-10-CM

## 2023-07-30 DIAGNOSIS — M5442 Lumbago with sciatica, left side: Secondary | ICD-10-CM | POA: Diagnosis not present

## 2023-07-30 DIAGNOSIS — M5441 Lumbago with sciatica, right side: Secondary | ICD-10-CM | POA: Diagnosis not present

## 2023-07-30 DIAGNOSIS — M5416 Radiculopathy, lumbar region: Secondary | ICD-10-CM | POA: Diagnosis not present

## 2023-07-30 NOTE — Progress Notes (Unsigned)
 Oscar Smith - 78 y.o. male MRN [id number]  Date of birth: 01-Dec-1945  Office Visit Note: Visit Date: 07/30/2023 PCP: Purcell Emil Schanz, MD Referred by: Purcell Emil Schanz, *  Subjective: Chief Complaint  Patient presents with   Lower Back - Pain   HPI: Oscar Smith is a 78 y.o. male who comes in today for evaluation of chronic, worsening and severe bilateral lower back pain radiating to hips buttocks and anterior thighs, right greater than left. Here is here today for lumbar MRI review. Pain ongoing for several years, worsens with prolonged walking. He is not able to walk long distances. Some relief of pain with home exercise regimen, rest and use of medications. He describes pain as sore and aching sensation, currently rates as 7 out of 10. History of formal physical therapy in the past with some relief of pain. Recent lumbar MRI imaging shows severe multifactorial spinal stenosis at L4-L5 with lateral recess narrowing bilaterally. There is moderate spinal canal stenosis noted at L3-L4. Right hip radiographs from 2020 does show mild to moderate degenerative changes to right hip. He is not experiencing right sided groin pain at this time, biggest issue is with walking. Patient denies focal weakness, numbness and tingling. No recent trauma or falls.       Review of Systems  Musculoskeletal:  Positive for back pain.  Neurological:  Negative for tingling, sensory change, focal weakness and weakness.  All other systems reviewed and are negative.  Otherwise per HPI.  Assessment & Plan: Visit Diagnoses:    ICD-10-CM   1. Chronic bilateral low back pain with bilateral sciatica  M54.42 Ambulatory referral to Orthopedic Surgery   M54.41 Ambulatory referral to Physical Medicine Rehab   G89.29     2. Lumbar radiculopathy  M54.16 Ambulatory referral to Orthopedic Surgery    Ambulatory referral to Physical Medicine Rehab    3. Facet arthropathy, lumbar  M47.816  Ambulatory referral to Orthopedic Surgery    Ambulatory referral to Physical Medicine Rehab    4. Spinal stenosis of lumbar region with neurogenic claudication  M48.062 Ambulatory referral to Orthopedic Surgery    Ambulatory referral to Physical Medicine Rehab       Plan: Findings:  Chronic, worsening and severe bilateral lower back pain radiating to hips buttocks and anterior thighs, right greater than left. He continues to have severe pain despite good conservative therapies such as formal physical therapy, home exercise regimen, rest and use of medications. I discussed recent lumbar MRI with him today using imaging and spine model. There is severe spinal canal stenosis at L4-L5, lateral recess also noted at this level. Moderate spinal canal stenosis at L3-L4. We discussed treatment options in detail today including injection therapy and surgical consultation. Patient would like to proceed with epidural steroid injection, he would also like to speak with our spine surgeon Dr. Ozell Ada to discuss options. I placed order for right L5-S1 interlaminar epidural steroid injection under fluoroscopic guidance. If good relief of pain with injection we can repeat this procedure infrequently as needed. Could also look at transforaminal approach as needed. I discussed injection procedure in detail, he has no questions at this time. I also placed referral to Dr. Ada for surgical consultation. Patient has plans to travel to [place] in September, I am hopeful we can get him in for injection prior to his trip. He does voice anxiety related to procedures, he does have Ativan  he can take prior to injection procedure. No red  flag symptoms noted upon exam today.     Meds & Orders: No orders of the defined types were placed in this encounter.   Orders Placed This Encounter  Procedures   Ambulatory referral to Orthopedic Surgery   Ambulatory referral to Physical Medicine Rehab    Follow-up: Return for Right  L5-S1 interlaminar epidural steroid injection.   Procedures: No procedures performed      Clinical History: MRI LUMBAR SPINE WITHOUT CONTRAST   TECHNIQUE: Multiplanar, multisequence MR imaging of the lumbar spine was performed. No intravenous contrast was administered.   COMPARISON:  Lumbar spine radiographs 06/28/2023.   FINDINGS: Segmentation: Conventional anatomy assumed, with the last open disc space designated L5-S1.Concordant with prior radiographs.   Alignment: Moderate to severe convex left scoliosis centered at L2-3. Minimal degenerative anterolisthesis at L4-5.   Vertebrae: No worrisome osseous lesion, acute fracture or pars defect. Mixed (Modic 2) endplate degenerative changes asymmetric to the left at L5-S1. The visualized sacroiliac joints appear unremarkable.   Conus medullaris: Extends to the T12-L1 level. The conus and cauda equina appear normal.   Paraspinal and other soft tissues: No significant paraspinal findings. Partially imaged left renal cyst for which no specific follow-up imaging is recommended.   Disc levels:   Sagittal images demonstrate no significant disc space findings within the visualized lower thoracic spine.   T12-L1: Preserved disc height with mild disc bulging and facet hypertrophy. No spinal stenosis. Mild left foraminal narrowing without nerve root impingement.   L1-2: Chronic loss of disc height with annular disc bulging and endplate osteophytes asymmetric to the right. Mild bilateral facet hypertrophy. No spinal stenosis. Mild right lateral recess and right foraminal narrowing without nerve root impingement.   L2-3: Chronic loss of disc height with annular disc bulging and endplate osteophytes asymmetric to the right. Mild facet and ligamentous hypertrophy. No significant spinal stenosis. Mild right lateral recess and mild to moderate right foraminal narrowing.   L3-4: Relatively preserved disc height with annular disc  bulging and endplate osteophytes asymmetric to the right. Moderate facet and ligamentous hypertrophy. Resulting moderate multifactorial spinal stenosis with mild lateral recess and foraminal narrowing bilaterally.   L4-5: Mild loss of disc height with annular disc bulging and endplate osteophytes. Moderate to advanced bilateral facet hypertrophy. Resulting severe multifactorial spinal stenosis with lateral recess narrowing bilaterally. There is mild-to-moderate foraminal narrowing as well.   L5-S1: Mild loss of disc height with disc bulging and endplate osteophytes asymmetric to the left. Mild bilateral facet hypertrophy. No spinal stenosis. Moderate osseous foraminal narrowing on the left with possible chronic left L5 nerve root encroachment within and beyond the foramen.   IMPRESSION: 1. Multilevel spondylosis superimposed on a convex left scoliosis. No acute osseous findings. 2. Severe multifactorial spinal stenosis at L4-5 with lateral recess narrowing bilaterally. 3. Moderate multifactorial spinal stenosis at L3-4 with mild lateral recess and foraminal narrowing bilaterally. 4. Moderate osseous foraminal narrowing on the left at L5-S1 with possible chronic left L5 nerve root encroachment within and beyond the foramen. 5. No evidence of metastatic disease.     Electronically Signed   By: Elsie Perone M.D.   On: 07/25/2023 12:52   He reports that he quit smoking about 57 years ago. His smoking use included cigarettes. He started smoking about 59 years ago. He has a 1 pack-year smoking history. He has never used smokeless tobacco.  Recent Labs    09/19/22 0843 03/22/23 0838  HGBA1C 5.1 5.3    Objective:  VS:  HT:  WT:   BMI:     BP:   HR: bpm  TEMP: ( )  RESP:  Physical Exam Vitals and nursing note reviewed.  HENT:     Head: Normocephalic and atraumatic.     Right Ear: External ear normal.     Left Ear: External ear normal.     Nose: Nose normal.      Mouth/Throat:     Mouth: Mucous membranes are moist.  Eyes:     Extraocular Movements: Extraocular movements intact.  Cardiovascular:     Rate and Rhythm: Normal rate.     Pulses: Normal pulses.  Pulmonary:     Effort: Pulmonary effort is normal.  Abdominal:     General: Abdomen is flat. There is no distension.  Musculoskeletal:        General: Tenderness present.     Cervical back: Normal range of motion.  Skin:    General: Skin is warm and dry.     Capillary Refill: Capillary refill takes less than 2 seconds.  Neurological:     General: No focal deficit present.     Mental Status: He is alert and oriented to person, place, and time.  Psychiatric:        Mood and Affect: Mood normal.        Behavior: Behavior normal.     Ortho Exam  Imaging: No results found.  Past Medical/Family/Surgical/Social History: Medications & Allergies reviewed per EMR, new medications updated. Patient Active Problem List   Diagnosis Date Noted   History of prostate cancer 03/22/2023   Benign neoplasm of descending colon    Benign neoplasm of transverse colon    Situational depression 03/21/2021   Dyslipidemia 09/17/2019   History of diverticulosis 09/17/2019   Chronic sinus complaints 09/17/2019   Essential hypertension 07/03/2018   Pure hypercholesterolemia 07/03/2018   Osteoarthritis of right hip 02/07/2018   Enlarged prostate with urinary retention 12/25/2013   Erectile dysfunction 12/25/2013   Chronic sinusitis 02/20/2012   Frontal mucocele 02/20/2012   Nasal septal deviation 02/20/2012   ADENOCARCINOMA, PROSTATE, GLEASON GRADE 6 12/21/2009   GERD 11/12/2008   Past Medical History:  Diagnosis Date   BPH with obstruction/lower urinary tract symptoms    urologist--- dr alvaro   CAD (coronary artery disease)    cardiologist--- dr c. kate;  per  note native artery;   nuclear stress test 12-13-2020  low risk w/ fixed perfusion defect apex w/ hypokinesis consistant w/ prior infart,   normal wall motion,  nuclear ef 54%;   coronary CT 01-05-2020  score= 767   ED (erectile dysfunction) of organic origin    First degree heart block    GERD (gastroesophageal reflux disease)    Hiatal hernia    History of colon polyps    2002 and 2003 hyperplastic   History of condyloma acuminatum    penis   History of diverticulitis of colon    History of gastritis    Hypertension    Left bundle branch block    Malignant neoplasm prostate [facility] 11/2009   urologist--- dr alvaro;   dx 11/ 2011,  gleason 3+3,  completed IMRT 03/ 2012   Penile cancer [facility])    urologist--- dr alvaro ;    recurrent 11/ 2023   Wears glasses    Wears hearing aid in both ears    Family History  Problem Relation Age of Onset   Cancer Father 49       stomach versus colon cancer  Pancreatic cancer Sister 59   Cancer Brother        penile cancer   Esophageal cancer Neg Hx    Throat cancer Neg Hx    Past Surgical History:  Procedure Laterality Date   BIOPSY  08/04/2021   Procedure: BIOPSY;  Surgeon: Aneita Gwendlyn DASEN, MD;  Location: [facility];  Service: Gastroenterology;;   CIRCUMCISION N/A 06/09/2015   Procedure: CIRCUMCISION ADULT WITH PENILE BLOCK;  Surgeon: Ricardo Likens, MD;  Location: [facility];  Service: Urology;  Laterality: N/A;   COLONOSCOPY WITH PROPOFOL  N/A 08/04/2021   Procedure: COLONOSCOPY WITH PROPOFOL ;  Surgeon: Aneita Gwendlyn DASEN, MD;  Location: [facility];  Service: Gastroenterology;  Laterality: N/A;   CONDYLOMA EXCISION/FULGURATION N/A 06/09/2020   Procedure: EXCISION OF PENILE WARTS;  Surgeon: Likens Ricardo, MD;  Location: [facility];  Service: Urology;  Laterality: N/A;  45 MINS   ENDOVENOUS ABLATION SAPHENOUS VEIN W/ LASER Left 02/10/2016   endovenous laser ablation left greater saphenous vein and stab phlebectomy left leg by Krystal Doing MD   INGUINAL HERNIA REPAIR Right 12/23/2009   @[facility]  by dr merrilyn   LIPOMA EXCISION N/A 12/01/2016    Procedure: EXCISION POSTERIOR NECK MASS;  Surgeon: Arelia Filippo, MD;  Location: [facility];  Service: Plastics;  Laterality: N/A;   PENILE BIOPSY N/A 12/14/2021   Procedure: LOCAL EXCISION OF PENILE CANCER WITH PENILE BLOCK;  Surgeon: Likens Ricardo, MD;  Location: [facility];  Service: Urology;  Laterality: N/A;  1 HR   POLYPECTOMY  08/04/2021   Procedure: POLYPECTOMY;  Surgeon: Aneita Gwendlyn DASEN, MD;  Location: [facility];  Service: Gastroenterology;;   SHOULDER ARTHROSCOPY WITH SUBACROMIAL DECOMPRESSION, ROTATOR CUFF REPAIR AND BICEP TENDON REPAIR Left 10/2014   Social History   Occupational History   Occupation: Psychologist, educational   Occupation: Art gallery manager  Tobacco Use   Smoking status: Former    Current packs/day: 0.00    Average packs/day: 0.5 packs/day for 2.0 years (1.0 ttl pk-yrs)    Types: Cigarettes    Start date: 09/05/1963    Quit date: 09/04/1965    Years since quitting: 57.9   Smokeless tobacco: Never   Tobacco comments:    quit around 78 yrs old.  Vaping Use   Vaping status: Never Used  Substance and Sexual Activity   Alcohol use: Not Currently    Alcohol/week: 2.0 standard drinks of alcohol    Types: 1 Glasses of wine, 1 Cans of beer per week    Comment: drink 1-2 at night    Drug use: Never   Sexual activity: Yes    Partners: Female

## 2023-07-30 NOTE — Progress Notes (Unsigned)
 Pain Scale   Average Pain 0 Patient advising he has lower back pain and states that walking increases his pain and resting decreases his lower back pain        +Driver, -BT, -Dye Allergies.

## 2023-08-01 DIAGNOSIS — H2513 Age-related nuclear cataract, bilateral: Secondary | ICD-10-CM | POA: Diagnosis not present

## 2023-08-01 DIAGNOSIS — H25013 Cortical age-related cataract, bilateral: Secondary | ICD-10-CM | POA: Diagnosis not present

## 2023-08-14 ENCOUNTER — Encounter: Payer: Self-pay | Admitting: Emergency Medicine

## 2023-08-16 NOTE — Telephone Encounter (Signed)
 Recommend orthopedic evaluation for this shoulder.  Place referral if needed.  Thanks.

## 2023-08-17 ENCOUNTER — Other Ambulatory Visit: Payer: Self-pay | Admitting: Radiology

## 2023-08-17 DIAGNOSIS — M25512 Pain in left shoulder: Secondary | ICD-10-CM

## 2023-08-21 ENCOUNTER — Other Ambulatory Visit: Payer: Self-pay | Admitting: Emergency Medicine

## 2023-08-27 ENCOUNTER — Ambulatory Visit: Admitting: Physical Medicine and Rehabilitation

## 2023-08-27 ENCOUNTER — Other Ambulatory Visit: Payer: Self-pay

## 2023-08-27 VITALS — BP 121/72 | HR 79

## 2023-08-27 DIAGNOSIS — M5416 Radiculopathy, lumbar region: Secondary | ICD-10-CM

## 2023-08-27 MED ORDER — METHYLPREDNISOLONE ACETATE 40 MG/ML IJ SUSP
40.0000 mg | Freq: Once | INTRAMUSCULAR | Status: AC
  Administered 2023-08-27: 40 mg

## 2023-08-27 NOTE — Progress Notes (Signed)
 Pain Scale   Average Pain 0 Patient advising he had pain yesterday after walking, but today no pain yet. Patient advising when he walks and stands for a long period of time his pain increases and when he lays down pain decreases.        +Driver, -BT, -Dye Allergies.

## 2023-08-27 NOTE — Procedures (Signed)
 Lumbar Epidural Steroid Injection - Interlaminar Approach with Fluoroscopic Guidance  Patient: Oscar Smith      Date of Birth: [birth year] MRN: [id number] PCP: Purcell Emil Schanz, MD      Visit Date: 08/27/2023   Universal Protocol:     Consent Given By: the patient  Position: PRONE  Additional Comments: Vital signs were monitored before and after the procedure. Patient was prepped and draped in the usual sterile fashion. The correct patient, procedure, and site was verified.   Injection Procedure Details:   Procedure diagnoses: Lumbar radiculopathy [M54.16]   Meds Administered:  Meds ordered this encounter  Medications   methylPREDNISolone  acetate (DEPO-MEDROL ) injection 40 mg     Laterality: Left  Location/Site:  L5-S1  Needle: 3.5 in., 20 ga. Tuohy  Needle Placement: Paramedian epidural  Findings:   -Comments: Excellent flow of contrast into the epidural space.  Procedure Details: Using a paramedian approach from the side mentioned above, the region overlying the inferior lamina was localized under fluoroscopic visualization and the soft tissues overlying this structure were infiltrated with 4 ml. of 1% Lidocaine  without Epinephrine . The Tuohy needle was inserted into the epidural space using a paramedian approach.   The epidural space was localized using loss of resistance along with counter oblique bi-planar fluoroscopic views.  After negative aspirate for air, blood, and CSF, a 2 ml. volume of Isovue-250 was injected into the epidural space and the flow of contrast was observed. Radiographs were obtained for documentation purposes.    The injectate was administered into the level noted above.   Additional Comments:  The patient tolerated the procedure well Dressing: 2 x 2 sterile gauze and Band-Aid    Post-procedure details: Patient was observed during the procedure. Post-procedure instructions were reviewed.  Patient left the clinic in stable  condition.

## 2023-08-27 NOTE — Progress Notes (Signed)
 Oscar Smith - 78 y.o. male MRN [id number]  Date of birth: 05-19-1945  Office Visit Note: Visit Date: 08/27/2023 PCP: Purcell Emil Schanz, MD Referred by: Purcell Emil Schanz, *  Subjective: Chief Complaint  Patient presents with   Lower Back - Pain   HPI:  Oscar Smith is a 78 y.o. male who comes in today at the request of Duwaine Pouch, FNP for planned Left L5-S1 Lumbar Interlaminar epidural steroid injection with fluoroscopic guidance.  The patient has failed conservative care including home exercise, medications, time and activity modification.  This injection will be diagnostic and hopefully therapeutic.  Please see requesting physician notes for further details and justification.   ROS Otherwise per HPI.  Assessment & Plan: Visit Diagnoses:    ICD-10-CM   1. Lumbar radiculopathy  M54.16 XR C-ARM NO REPORT    Epidural Steroid injection    methylPREDNISolone  acetate (DEPO-MEDROL ) injection 40 mg      Plan: No additional findings.   Meds & Orders:  Meds ordered this encounter  Medications   methylPREDNISolone  acetate (DEPO-MEDROL ) injection 40 mg    Orders Placed This Encounter  Procedures   XR C-ARM NO REPORT   Epidural Steroid injection    Follow-up: Return for visit to requesting provider as needed.   Procedures: No procedures performed  Lumbar Epidural Steroid Injection - Interlaminar Approach with Fluoroscopic Guidance  Patient: Oscar Smith      Date of Birth: January 03, 1946 MRN: [id number] PCP: Purcell Emil Schanz, MD      Visit Date: 08/27/2023   Universal Protocol:     Consent Given By: the patient  Position: PRONE  Additional Comments: Vital signs were monitored before and after the procedure. Patient was prepped and draped in the usual sterile fashion. The correct patient, procedure, and site was verified.   Injection Procedure Details:   Procedure diagnoses: Lumbar radiculopathy [M54.16]   Meds  Administered:  Meds ordered this encounter  Medications   methylPREDNISolone  acetate (DEPO-MEDROL ) injection 40 mg     Laterality: Left  Location/Site:  L5-S1  Needle: 3.5 in., 20 ga. Tuohy  Needle Placement: Paramedian epidural  Findings:   -Comments: Excellent flow of contrast into the epidural space.  Procedure Details: Using a paramedian approach from the side mentioned above, the region overlying the inferior lamina was localized under fluoroscopic visualization and the soft tissues overlying this structure were infiltrated with 4 ml. of 1% Lidocaine  without Epinephrine . The Tuohy needle was inserted into the epidural space using a paramedian approach.   The epidural space was localized using loss of resistance along with counter oblique bi-planar fluoroscopic views.  After negative aspirate for air, blood, and CSF, a 2 ml. volume of Isovue-250 was injected into the epidural space and the flow of contrast was observed. Radiographs were obtained for documentation purposes.    The injectate was administered into the level noted above.   Additional Comments:  The patient tolerated the procedure well Dressing: 2 x 2 sterile gauze and Band-Aid    Post-procedure details: Patient was observed during the procedure. Post-procedure instructions were reviewed.  Patient left the clinic in stable condition.   Clinical History: MRI LUMBAR SPINE WITHOUT CONTRAST   TECHNIQUE: Multiplanar, multisequence MR imaging of the lumbar spine was performed. No intravenous contrast was administered.   COMPARISON:  Lumbar spine radiographs 06/28/2023.   FINDINGS: Segmentation: Conventional anatomy assumed, with the last open disc space designated L5-S1.Concordant with prior radiographs.   Alignment: Moderate to severe  convex left scoliosis centered at L2-3. Minimal degenerative anterolisthesis at L4-5.   Vertebrae: No worrisome osseous lesion, acute fracture or pars defect. Mixed (Modic  2) endplate degenerative changes asymmetric to the left at L5-S1. The visualized sacroiliac joints appear unremarkable.   Conus medullaris: Extends to the T12-L1 level. The conus and cauda equina appear normal.   Paraspinal and other soft tissues: No significant paraspinal findings. Partially imaged left renal cyst for which no specific follow-up imaging is recommended.   Disc levels:   Sagittal images demonstrate no significant disc space findings within the visualized lower thoracic spine.   T12-L1: Preserved disc height with mild disc bulging and facet hypertrophy. No spinal stenosis. Mild left foraminal narrowing without nerve root impingement.   L1-2: Chronic loss of disc height with annular disc bulging and endplate osteophytes asymmetric to the right. Mild bilateral facet hypertrophy. No spinal stenosis. Mild right lateral recess and right foraminal narrowing without nerve root impingement.   L2-3: Chronic loss of disc height with annular disc bulging and endplate osteophytes asymmetric to the right. Mild facet and ligamentous hypertrophy. No significant spinal stenosis. Mild right lateral recess and mild to moderate right foraminal narrowing.   L3-4: Relatively preserved disc height with annular disc bulging and endplate osteophytes asymmetric to the right. Moderate facet and ligamentous hypertrophy. Resulting moderate multifactorial spinal stenosis with mild lateral recess and foraminal narrowing bilaterally.   L4-5: Mild loss of disc height with annular disc bulging and endplate osteophytes. Moderate to advanced bilateral facet hypertrophy. Resulting severe multifactorial spinal stenosis with lateral recess narrowing bilaterally. There is mild-to-moderate foraminal narrowing as well.   L5-S1: Mild loss of disc height with disc bulging and endplate osteophytes asymmetric to the left. Mild bilateral facet hypertrophy. No spinal stenosis. Moderate osseous  foraminal narrowing on the left with possible chronic left L5 nerve root encroachment within and beyond the foramen.   IMPRESSION: 1. Multilevel spondylosis superimposed on a convex left scoliosis. No acute osseous findings. 2. Severe multifactorial spinal stenosis at L4-5 with lateral recess narrowing bilaterally. 3. Moderate multifactorial spinal stenosis at L3-4 with mild lateral recess and foraminal narrowing bilaterally. 4. Moderate osseous foraminal narrowing on the left at L5-S1 with possible chronic left L5 nerve root encroachment within and beyond the foramen. 5. No evidence of metastatic disease.     Electronically Signed   By: Elsie Perone M.D.   On: 07/25/2023 12:52     Objective:  VS:  HT:    WT:   BMI:     BP:121/72  HR:79bpm  TEMP: ( )  RESP:  Physical Exam Vitals and nursing note reviewed.  Constitutional:      General: He is not in acute distress.    Appearance: Normal appearance. He is not ill-appearing.  HENT:     Head: Normocephalic and atraumatic.     Right Ear: External ear normal.     Left Ear: External ear normal.     Nose: No congestion.  Eyes:     Extraocular Movements: Extraocular movements intact.  Cardiovascular:     Rate and Rhythm: Normal rate.     Pulses: Normal pulses.  Pulmonary:     Effort: Pulmonary effort is normal. No respiratory distress.  Abdominal:     General: There is no distension.     Palpations: Abdomen is soft.  Musculoskeletal:        General: No tenderness or signs of injury.     Cervical back: Neck supple.     Right  lower leg: No edema.     Left lower leg: No edema.     Comments: Patient has good distal strength without clonus.  Skin:    Findings: No erythema or rash.  Neurological:     General: No focal deficit present.     Mental Status: He is alert and oriented to person, place, and time.     Sensory: No sensory deficit.     Motor: No weakness or abnormal muscle tone.     Coordination: Coordination  normal.  Psychiatric:        Mood and Affect: Mood normal.        Behavior: Behavior normal.      Imaging: No results found.

## 2023-08-27 NOTE — Patient Instructions (Signed)

## 2023-08-28 ENCOUNTER — Encounter: Payer: Self-pay | Admitting: Emergency Medicine

## 2023-08-30 ENCOUNTER — Ambulatory Visit: Admitting: Orthopedic Surgery

## 2023-08-30 ENCOUNTER — Other Ambulatory Visit (INDEPENDENT_AMBULATORY_CARE_PROVIDER_SITE_OTHER): Payer: Self-pay

## 2023-08-30 VITALS — BP 128/76 | HR 72 | Ht 68.0 in | Wt 189.0 lb

## 2023-08-30 DIAGNOSIS — M5416 Radiculopathy, lumbar region: Secondary | ICD-10-CM

## 2023-08-30 NOTE — Progress Notes (Signed)
 Orthopedic Spine Surgery Office Note  Assessment: Patient is a 78 y.o. male with low back and buttock pain and fatigue with ambulation and improves with flexion of the lumbar spine consistent with neurogenic claudication.  Has central stenosis at L4/5   Plan: -Explained that initially conservative treatment is tried as a significant number of patients may experience relief with these treatment modalities. Discussed that the conservative treatments include:  -activity modification  -physical therapy  -over the counter pain medications  -medrol  dosepak  -lumbar steroid injections -Patient has tried Tylenol , activity modification, lumbar steroid injection - Since patient is a percent better since getting the injection, recommended continued observation.  If he does have return of symptoms but noticed lasting relief with the injection, would recommend repeat injection -If his injection does not provide him with lasting relief, discussed L4/5 laminectomy as a treatment option.  Explained that there is a risk of progression of his scoliosis if we do just a decompression surgery as opposed to decompression and fusion.  However, the fusion surgery would be more extensive to address the scoliosis and he has concerns with his age about that amount of surgery so would probably plan for a laminectomy alone and monitor his curvature postoperatively -Patient should return to office on an as-needed basis   Patient expressed understanding of the plan and all questions were answered to the patient's satisfaction.   ___________________________________________________________________________   History:  Patient is a 78 y.o. male who presents today for lumbar spine.  Patient has had about 2 years of worsening walking endurance.  He has noticed a heaviness and fatigue in his back and buttocks when he is walking.  He gets better if he sits down.  He is no longer able to walk the distance that he used to walk.  He  has been only able to walk shorter and shorter distances before his symptoms come on.  He does notice that he leans over or flexes his lumbar spine to help with his pain and symptoms.  He recently got an injection with Dr. Eldonna and noticed 100% relief with that injection.  Weakness: Denies Symptoms of imbalance: Denies Paresthesias and numbness: Denies Bowel or bladder incontinence: Yes, has had urinary continence due to his prostate.  No recent changes in bowel or bladder habits Saddle anesthesia: Denies  Treatments tried: Tylenol , activity modification, lumbar steroid injection  Review of systems: Denies fevers and chills, night sweats, unexplained weight loss, history of cancer, pain that wakes him at night  Past medical history: CAD GERD History of prostate cancer HTN LBBB  Allergies: flomax, latex  Past surgical history:  Hernia repair Condyloma excision Polypectomy Lipoma excision Left shoulder rotator cuff repair   Social history: Denies use of nicotine product (smoking, vaping, patches, smokeless) Alcohol use: Yes, approximately 14 drinks Denies recreational drug use   Physical Exam:  BMI of 28.7  General: no acute distress, appears stated age Neurologic: alert, answering questions appropriately, following commands Respiratory: unlabored breathing on room air, symmetric chest rise Psychiatric: appropriate affect, normal cadence to speech   MSK (spine):  -Strength exam      Left  Right EHL    5/5  5/5 TA    5/5  5/5 GSC    5/5  5/5 Knee extension  5/5  5/5 Hip flexion   5/5  5/5  -Sensory exam    Sensation intact to light touch in L3-S1 nerve distributions of bilateral lower extremities   Imaging: XRs of the lumbar spine from  08/30/2023 were independently reviewed and interpreted, showing no evidence of instability on flexion/extension views.  Disc height loss and anterior osteophytes throughout the lumbar spine.  No fracture or dislocation  seen.  MRI of the lumbar spine from 07/14/2023 was independently reviewed and interpreted, showing central stenosis at L3/4.  More significant stenosis centrally and in the lateral recess at L4/5.  Bilateral foraminal stenosis at L2/3, L3/4, L4/5, L5/S1.   Patient name: Oscar Smith Patient MRN: [id number] Date of visit: 08/30/23

## 2023-09-05 ENCOUNTER — Ambulatory Visit: Admitting: Orthopedic Surgery

## 2023-09-05 ENCOUNTER — Other Ambulatory Visit (INDEPENDENT_AMBULATORY_CARE_PROVIDER_SITE_OTHER): Payer: Self-pay

## 2023-09-05 DIAGNOSIS — M25512 Pain in left shoulder: Secondary | ICD-10-CM

## 2023-09-05 MED ORDER — MELOXICAM 7.5 MG PO TABS: ORAL_TABLET | ORAL | 0 refills | Status: DC

## 2023-09-06 ENCOUNTER — Encounter: Payer: Self-pay | Admitting: Orthopedic Surgery

## 2023-09-06 NOTE — Progress Notes (Signed)
 Office Visit Note   Patient: Oscar Smith           Date of Birth: [birth year]           MRN: [id number] Visit Date: 09/05/2023 Requested by: Purcell Emil Schanz, MD [address] [place],  [place] [address] PCP: Purcell Emil Schanz, MD  Subjective: Chief Complaint  Patient presents with   Left Shoulder - Pain    HPI: Oscar Smith is a 78 y.o. male who presents to the office reporting left shoulder pain of 6 weeks duration.  Patient plays the guitar a lot in a classical manner which does involve a lot of of shoulder movement.  That bothers him at times.  Otherwise the shoulder does reasonably well with activities of daily living.  Describes prior surgery 8 years ago which sounds like it was arthroscopy with biceps tenodesis distal clavicle excision and possible rotator cuff tear repair.  He has about 20 minutes of playing endurance on the entire.  Overall he states his shoulder is improving.  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  He does okay with other activities.  Right shoulder no problem..                ROS: All systems reviewed are negative as they relate to the chief complaint within the history of present illness.  Patient denies fevers or chills.  Assessment & Plan: Visit Diagnoses:  1. Left shoulder pain, unspecified chronicity     Plan: Impression is good strength and range of motion in the left shoulder which has some coarseness with passive range of motion.  Right shoulder also has similar level of coarseness.  Nonetheless his function is excellent and the pain is improving.  Will try him with Mobic  7-1/2 mg a day for 2 weeks and then as needed.  Could consider manual joint injection as a neck step if short course of anti-inflammatories has not helped.  Follow-Up Instructions: No follow-ups on file.   Orders:  Orders Placed This Encounter  Procedures   XR Shoulder Left   Meds ordered this encounter  Medications   meloxicam  (MOBIC ) 7.5 MG  tablet    Sig: 1 po q d x 2 weeks then prn    Dispense:  30 tablet    Refill:  0      Procedures: No procedures performed   Clinical Data: No additional findings.  Objective: Vital Signs: There were no vitals taken for this visit.  Physical Exam:  Constitutional: Patient appears well-developed HEENT:  Head: Normocephalic Eyes:EOM are normal Neck: Normal range of motion Cardiovascular: Normal rate Pulmonary/chest: Effort normal Neurologic: Patient is alert Skin: Skin is warm Psychiatric: Patient has normal mood and affect  Ortho Exam: Ortho exam demonstrates bilateral shoulder range of motion of 70/100/170.  Has very good internal and external rotation strength at 15 degrees of abduction.  Bilateral coarseness is present consistent with rotator cuff pathology in both shoulders.  Has with internal and external rotation at 90 degrees of abduction.  No discrete AC joint tenderness is present on the left or right-hand side.  Cervical spine range of motion is intact.  Motor or sensory function to both hands intact with palpable radial pulse.  Specialty Comments:  MRI LUMBAR SPINE WITHOUT CONTRAST   TECHNIQUE: Multiplanar, multisequence MR imaging of the lumbar spine was performed. No intravenous contrast was administered.   COMPARISON:  Lumbar spine radiographs 06/28/2023.   FINDINGS: Segmentation: Conventional anatomy assumed,  with the last open disc space designated L5-S1.Concordant with prior radiographs.   Alignment: Moderate to severe convex left scoliosis centered at L2-3. Minimal degenerative anterolisthesis at L4-5.   Vertebrae: No worrisome osseous lesion, acute fracture or pars defect. Mixed (Modic 2) endplate degenerative changes asymmetric to the left at L5-S1. The visualized sacroiliac joints appear unremarkable.   Conus medullaris: Extends to the T12-L1 level. The conus and cauda equina appear normal.   Paraspinal and other soft tissues: No significant  paraspinal findings. Partially imaged left renal cyst for which no specific follow-up imaging is recommended.   Disc levels:   Sagittal images demonstrate no significant disc space findings within the visualized lower thoracic spine.   T12-L1: Preserved disc height with mild disc bulging and facet hypertrophy. No spinal stenosis. Mild left foraminal narrowing without nerve root impingement.   L1-2: Chronic loss of disc height with annular disc bulging and endplate osteophytes asymmetric to the right. Mild bilateral facet hypertrophy. No spinal stenosis. Mild right lateral recess and right foraminal narrowing without nerve root impingement.   L2-3: Chronic loss of disc height with annular disc bulging and endplate osteophytes asymmetric to the right. Mild facet and ligamentous hypertrophy. No significant spinal stenosis. Mild right lateral recess and mild to moderate right foraminal narrowing.   L3-4: Relatively preserved disc height with annular disc bulging and endplate osteophytes asymmetric to the right. Moderate facet and ligamentous hypertrophy. Resulting moderate multifactorial spinal stenosis with mild lateral recess and foraminal narrowing bilaterally.   L4-5: Mild loss of disc height with annular disc bulging and endplate osteophytes. Moderate to advanced bilateral facet hypertrophy. Resulting severe multifactorial spinal stenosis with lateral recess narrowing bilaterally. There is mild-to-moderate foraminal narrowing as well.   L5-S1: Mild loss of disc height with disc bulging and endplate osteophytes asymmetric to the left. Mild bilateral facet hypertrophy. No spinal stenosis. Moderate osseous foraminal narrowing on the left with possible chronic left L5 nerve root encroachment within and beyond the foramen.   IMPRESSION: 1. Multilevel spondylosis superimposed on a convex left scoliosis. No acute osseous findings. 2. Severe multifactorial spinal stenosis at L4-5  with lateral recess narrowing bilaterally. 3. Moderate multifactorial spinal stenosis at L3-4 with mild lateral recess and foraminal narrowing bilaterally. 4. Moderate osseous foraminal narrowing on the left at L5-S1 with possible chronic left L5 nerve root encroachment within and beyond the foramen. 5. No evidence of metastatic disease.     Electronically Signed   By: Elsie Perone M.D.   On: 07/25/2023 12:52  Imaging: XR Shoulder Left Result Date: 09/06/2023 AP axillary outlet radiographs left shoulder reviewed.  Mild glenohumeral joint space narrowing is present.  Acromiohumeral distance appears maintained.  No acute fracture.  No dislocation.  Visualized lung fields intact    PMFS History: Patient Active Problem List   Diagnosis Date Noted   History of prostate cancer 03/22/2023   Benign neoplasm of descending colon    Benign neoplasm of transverse colon    Situational depression 03/21/2021   Dyslipidemia 09/17/2019   History of diverticulosis 09/17/2019   Chronic sinus complaints 09/17/2019   Essential hypertension 07/03/2018   Pure hypercholesterolemia 07/03/2018   Osteoarthritis of right hip 02/07/2018   Enlarged prostate with urinary retention 12/25/2013   Erectile dysfunction 12/25/2013   Chronic sinusitis 02/20/2012   Frontal mucocele 02/20/2012   Nasal septal deviation 02/20/2012   ADENOCARCINOMA, PROSTATE, GLEASON GRADE 6 12/21/2009   GERD 11/12/2008   Past Medical History:  Diagnosis Date   BPH with obstruction/lower urinary  tract symptoms    urologist--- dr alvaro   CAD (coronary artery disease)    cardiologist--- dr c. kate;  per  note native artery;   nuclear stress test 12-13-2020  low risk w/ fixed perfusion defect apex w/ hypokinesis consistant w/ prior infart,  normal wall motion,  nuclear ef 54%;   coronary CT 01-05-2020  score= 767   ED (erectile dysfunction) of organic origin    First degree heart block    GERD (gastroesophageal reflux  disease)    Hiatal hernia    History of colon polyps    2002 and 2003 hyperplastic   History of condyloma acuminatum    penis   History of diverticulitis of colon    History of gastritis    Hypertension    Left bundle branch block    Malignant neoplasm prostate [facility]) 11/2009   urologist--- dr alvaro;   dx 11/ 2011,  gleason 3+3,  completed IMRT 03/ 2012   Penile cancer [facility])    urologist--- dr alvaro ;    recurrent 11/ 2023   Wears glasses    Wears hearing aid in both ears     Family History  Problem Relation Age of Onset   Cancer Father 43       stomach versus colon cancer   Pancreatic cancer Sister 80   Cancer Brother        penile cancer   Esophageal cancer Neg Hx    Throat cancer Neg Hx     Past Surgical History:  Procedure Laterality Date   BIOPSY  08/04/2021   Procedure: BIOPSY;  Surgeon: Aneita Gwendlyn DASEN, MD;  Location: [facility];  Service: Gastroenterology;;   CIRCUMCISION N/A 06/09/2015   Procedure: CIRCUMCISION ADULT WITH PENILE BLOCK;  Surgeon: Ricardo alvaro, MD;  Location: [facility];  Service: Urology;  Laterality: N/A;   COLONOSCOPY WITH PROPOFOL  N/A 08/04/2021   Procedure: COLONOSCOPY WITH PROPOFOL ;  Surgeon: Aneita Gwendlyn DASEN, MD;  Location: [facility];  Service: Gastroenterology;  Laterality: N/A;   CONDYLOMA EXCISION/FULGURATION N/A 06/09/2020   Procedure: EXCISION OF PENILE WARTS;  Surgeon: alvaro Ricardo, MD;  Location: [facility];  Service: Urology;  Laterality: N/A;  45 MINS   ENDOVENOUS ABLATION SAPHENOUS VEIN W/ LASER Left 02/10/2016   endovenous laser ablation left greater saphenous vein and stab phlebectomy left leg by Krystal Doing MD   INGUINAL HERNIA REPAIR Right 12/23/2009   @[facility]  by dr merrilyn   LIPOMA EXCISION N/A 12/01/2016   Procedure: EXCISION POSTERIOR NECK MASS;  Surgeon: Arelia Filippo, MD;  Location: [facility];  Service: Plastics;  Laterality: N/A;   PENILE BIOPSY N/A 12/14/2021    Procedure: LOCAL EXCISION OF PENILE CANCER WITH PENILE BLOCK;  Surgeon: alvaro Ricardo, MD;  Location: [facility];  Service: Urology;  Laterality: N/A;  1 HR   POLYPECTOMY  08/04/2021   Procedure: POLYPECTOMY;  Surgeon: Aneita Gwendlyn DASEN, MD;  Location: [facility];  Service: Gastroenterology;;   SHOULDER ARTHROSCOPY WITH SUBACROMIAL DECOMPRESSION, ROTATOR CUFF REPAIR AND BICEP TENDON REPAIR Left 10/2014   Social History   Occupational History   Occupation: Psychologist, educational   Occupation: Art gallery manager  Tobacco Use   Smoking status: Former    Current packs/day: 0.00    Average packs/day: 0.5 packs/day for 2.0 years (1.0 ttl pk-yrs)    Types: Cigarettes    Start date: 09/05/1963    Quit date: 09/04/1965    Years since quitting: 58.0   Smokeless tobacco: Never  Tobacco comments:    quit around 78 yrs old.  Vaping Use   Vaping status: Never Used  Substance and Sexual Activity   Alcohol use: Not Currently    Alcohol/week: 2.0 standard drinks of alcohol    Types: 1 Glasses of wine, 1 Cans of beer per week    Comment: drink 1-2 at night    Drug use: Never   Sexual activity: Yes    Partners: Female

## 2023-09-18 DIAGNOSIS — H6123 Impacted cerumen, bilateral: Secondary | ICD-10-CM | POA: Diagnosis not present

## 2023-09-19 ENCOUNTER — Ambulatory Visit: Payer: Medicare Other | Admitting: Emergency Medicine

## 2023-09-19 ENCOUNTER — Other Ambulatory Visit (INDEPENDENT_AMBULATORY_CARE_PROVIDER_SITE_OTHER)

## 2023-09-19 ENCOUNTER — Ambulatory Visit: Payer: Self-pay | Admitting: Emergency Medicine

## 2023-09-19 ENCOUNTER — Encounter: Payer: Self-pay | Admitting: Emergency Medicine

## 2023-09-19 VITALS — BP 122/64 | HR 72 | Ht 68.0 in | Wt 185.0 lb

## 2023-09-19 DIAGNOSIS — K219 Gastro-esophageal reflux disease without esophagitis: Secondary | ICD-10-CM

## 2023-09-19 DIAGNOSIS — J329 Chronic sinusitis, unspecified: Secondary | ICD-10-CM

## 2023-09-19 DIAGNOSIS — I10 Essential (primary) hypertension: Secondary | ICD-10-CM

## 2023-09-19 DIAGNOSIS — C61 Malignant neoplasm of prostate: Secondary | ICD-10-CM

## 2023-09-19 DIAGNOSIS — Z8546 Personal history of malignant neoplasm of prostate: Secondary | ICD-10-CM

## 2023-09-19 DIAGNOSIS — K599 Functional intestinal disorder, unspecified: Secondary | ICD-10-CM | POA: Diagnosis not present

## 2023-09-19 DIAGNOSIS — E785 Hyperlipidemia, unspecified: Secondary | ICD-10-CM | POA: Diagnosis not present

## 2023-09-19 LAB — LIPID PANEL
Cholesterol: 114 mg/dL (ref 0–200)
HDL: 40.5 mg/dL (ref >39.00)
LDL Cholesterol: 48 mg/dL (ref 0–99)
NonHDL: 73.42
Total CHOL/HDL Ratio: 3
Triglycerides: 128 mg/dL (ref 0.0–149.0)
VLDL: 25.6 mg/dL (ref 0.0–40.0)

## 2023-09-19 LAB — CBC WITH DIFFERENTIAL/PLATELET
Basophils Absolute: 0 K/uL (ref 0.0–0.1)
Basophils Relative: 0.8 % (ref 0.0–3.0)
Eosinophils Absolute: 0.1 K/uL (ref 0.0–0.7)
Eosinophils Relative: 1.6 % (ref 0.0–5.0)
HCT: 41.2 % (ref 39.0–52.0)
Hemoglobin: 14.2 g/dL (ref 13.0–17.0)
Lymphocytes Relative: 28.9 % (ref 12.0–46.0)
Lymphs Abs: 1.5 K/uL (ref 0.7–4.0)
MCHC: 34.4 g/dL (ref 30.0–36.0)
MCV: 90.8 fl (ref 78.0–100.0)
Monocytes Absolute: 0.6 K/uL (ref 0.1–1.0)
Monocytes Relative: 10.5 % (ref 3.0–12.0)
Neutro Abs: 3.1 K/uL (ref 1.4–7.7)
Neutrophils Relative %: 58.2 % (ref 43.0–77.0)
Platelets: 214 K/uL (ref 150.0–400.0)
RBC: 4.54 Mil/uL (ref 4.22–5.81)
RDW: 12.9 % (ref 11.5–15.5)
WBC: 5.3 K/uL (ref 4.0–10.5)

## 2023-09-19 LAB — PSA: PSA: 0.34 ng/mL (ref 0.10–4.00)

## 2023-09-19 LAB — COMPREHENSIVE METABOLIC PANEL WITH GFR
ALT: 23 U/L (ref 0–53)
AST: 18 U/L (ref 0–37)
Albumin: 4.1 g/dL (ref 3.5–5.2)
Alkaline Phosphatase: 88 U/L (ref 39–117)
BUN: 13 mg/dL (ref 6–23)
CO2: 25 meq/L (ref 19–32)
Calcium: 8.9 mg/dL (ref 8.4–10.5)
Chloride: 97 meq/L (ref 96–112)
Creatinine, Ser: 0.8 mg/dL (ref 0.40–1.50)
GFR: 83.76 mL/min (ref >60.00)
Glucose, Bld: 98 mg/dL (ref 70–99)
Potassium: 4.2 meq/L (ref 3.5–5.1)
Sodium: 132 meq/L — ABNORMAL LOW (ref 135–145)
Total Bilirubin: 0.9 mg/dL (ref 0.2–1.2)
Total Protein: 7 g/dL (ref 6.0–8.3)

## 2023-09-19 LAB — VITAMIN B12: Vitamin B-12: 515 pg/mL (ref 211–911)

## 2023-09-19 LAB — HEMOGLOBIN A1C: Hgb A1c MFr Bld: 5.4 % (ref 4.6–6.5)

## 2023-09-19 LAB — VITAMIN D 25 HYDROXY (VIT D DEFICIENCY, FRACTURES): VITD: 40.29 ng/mL (ref 30.00–100.00)

## 2023-09-19 NOTE — Patient Instructions (Signed)
 Health Maintenance After Age 80 After age 27, you are at a higher risk for certain long-term diseases and infections as well as injuries from falls. Falls are a major cause of broken bones and head injuries in people who are older than age 73. Getting regular preventive care can help to keep you healthy and well. Preventive care includes getting regular testing and making lifestyle changes as recommended by your health care provider. Talk with your health care provider about: Which screenings and tests you should have. A screening is a test that checks for a disease when you have no symptoms. A diet and exercise plan that is right for you. What should I know about screenings and tests to prevent falls? Screening and testing are the best ways to find a health problem early. Early diagnosis and treatment give you the best chance of managing medical conditions that are common after age 90. Certain conditions and lifestyle choices may make you more likely to have a fall. Your health care provider may recommend: Regular vision checks. Poor vision and conditions such as cataracts can make you more likely to have a fall. If you wear glasses, make sure to get your prescription updated if your vision changes. Medicine review. Work with your health care provider to regularly review all of the medicines you are taking, including over-the-counter medicines. Ask your health care provider about any side effects that may make you more likely to have a fall. Tell your health care provider if any medicines that you take make you feel dizzy or sleepy. Strength and balance checks. Your health care provider may recommend certain tests to check your strength and balance while standing, walking, or changing positions. Foot health exam. Foot pain and numbness, as well as not wearing proper footwear, can make you more likely to have a fall. Screenings, including: Osteoporosis screening. Osteoporosis is a condition that causes  the bones to get weaker and break more easily. Blood pressure screening. Blood pressure changes and medicines to control blood pressure can make you feel dizzy. Depression screening. You may be more likely to have a fall if you have a fear of falling, feel depressed, or feel unable to do activities that you used to do. Alcohol  use screening. Using too much alcohol  can affect your balance and may make you more likely to have a fall. Follow these instructions at home: Lifestyle Do not drink alcohol  if: Your health care provider tells you not to drink. If you drink alcohol : Limit how much you have to: 0-1 drink a day for women. 0-2 drinks a day for men. Know how much alcohol  is in your drink. In the [place] one drink equals one 12 oz bottle of beer (355 mL), one 5 oz glass of wine (148 mL), or one 1 oz glass of hard liquor (44 mL). Do not use any products that contain nicotine or tobacco. These products include cigarettes, chewing tobacco, and vaping devices, such as e-cigarettes. If you need help quitting, ask your health care provider. Activity  Follow a regular exercise program to stay fit. This will help you maintain your balance. Ask your health care provider what types of exercise are appropriate for you. If you need a cane or walker, use it as recommended by your health care provider. Wear supportive shoes that have nonskid soles. Safety  Remove any tripping hazards, such as rugs, cords, and clutter. Install safety equipment such as grab bars in bathrooms and safety rails on stairs. Keep rooms and walkways  well-lit. General instructions Talk with your health care provider about your risks for falling. Tell your health care provider if: You fall. Be sure to tell your health care provider about all falls, even ones that seem minor. You feel dizzy, tiredness (fatigue), or off-balance. Take over-the-counter and prescription medicines only as told by your health care provider. These include  supplements. Eat a healthy diet and maintain a healthy weight. A healthy diet includes low-fat dairy products, low-fat (lean) meats, and fiber from whole grains, beans, and lots of fruits and vegetables. Stay current with your vaccines. Schedule regular health, dental, and eye exams. Summary Having a healthy lifestyle and getting preventive care can help to protect your health and wellness after age 15. Screening and testing are the best way to find a health problem early and help you avoid having a fall. Early diagnosis and treatment give you the best chance for managing medical conditions that are more common for people who are older than age 42. Falls are a major cause of broken bones and head injuries in people who are older than age 64. Take precautions to prevent a fall at home. Work with your health care provider to learn what changes you can make to improve your health and wellness and to prevent falls. This information is not intended to replace advice given to you by your health care provider. Make sure you discuss any questions you have with your health care provider. Document Revised: 05/31/2020 Document Reviewed: 05/31/2020 Elsevier Patient Education  2024 [employer].

## 2023-09-19 NOTE — Assessment & Plan Note (Addendum)
 Diet related. Diet and nutrition discussed Advised to increase daily fiber intake and take daily fiber supplement May benefit from probiotics Advised to stay well-hydrated At present time would avoid laxative but may take stool softeners

## 2023-09-19 NOTE — Progress Notes (Signed)
 Oscar Smith 78 y.o.   Chief Complaint  Patient presents with   Follow-up    Patient here for 6 month f/u for HTN. Patient mentions having a lot of mucus constantly, constipation and sometimes explosion diarrhea says its been days since he last had a bowel movement, does have some bloating. Patient mentions he is going to [place] next week for a month     HISTORY OF PRESENT ILLNESS: This is a 78 y.o. male here for 39-month follow-up of chronic medical conditions including hypertension Has had bowel movement problems with intermittent diarrhea and constipation.  Presently constipated for the past 4 days No other associated symptoms.  Denies fever or chills.  Able to eat and drink.  Denies nausea or vomiting. No other complaints or medical concerns today.  HPI   Prior to Admission medications   Medication Sig Start Date End Date Taking? Authorizing Provider  ABRYSVO 120 MCG/0.5ML injection Inject 0.5 mLs into the muscle once. 09/11/22  Yes [provider]  amLODipine  (NORVASC ) 5 MG tablet TAKE 1 TABLET BY MOUTH TWICE DAILY. 01/30/23  Yes Kate Lonni CROME, MD  aspirin EC 81 MG tablet Take 81 mg daily by mouth.   Yes [provider]  atorvastatin  (LIPITOR) 20 MG tablet TAKE ONE TABLET ONCE DAILY 02/13/23  Yes Kate Lonni CROME, MD  diphenhydramine-acetaminophen  (TYLENOL  PM) 25-500 MG TABS tablet Take 1 tablet by mouth at bedtime as needed (for sleep).   Yes [provider]  esomeprazole  (NEXIUM ) 40 MG capsule Take 1 capsule (40 mg total) by mouth daily. 06/04/23  Yes Lilyan Prete, Emil Schanz, MD  fluticasone  (FLONASE ) 50 MCG/ACT nasal spray Place 2 sprays into both nostrils daily as needed for allergies or rhinitis. 07/15/23  Yes Halaina Vanduzer Jose, MD  hydrocortisone 2.5 % cream Apply topically as needed.   Yes [provider]  lisinopril  (ZESTRIL ) 40 MG tablet TAKE ONE TABLET BY MOUTH DAILY 08/21/23  Yes Stuart Mirabile, Emil Schanz, MD   LORazepam  (ATIVAN ) 1 MG tablet Take 1 tablet (1 mg total) by mouth daily as needed for anxiety. 06/29/23  Yes Purcell Emil Schanz, MD  meloxicam  (MOBIC ) 7.5 MG tablet 1 po q d x 2 weeks then prn 09/05/23  Yes Dean, Cordella Hamilton, MD  Multiple Vitamin (MULTIVITAMIN) tablet Take 1 tablet by mouth daily.   Yes [provider]  FLUZONE HIGH-DOSE 0.5 ML injection Inject 0.5 mLs into the muscle once. Patient not taking: Reported on 09/19/2023 09/11/22   [provider]  [facility] syringe Inject 0.5 mLs into the muscle once. Patient not taking: Reported on 09/19/2023 08/25/22   [provider]    Allergies  Allergen Reactions   Flomax [Tamsulosin Hcl] Other (See Comments)    bad reaction = jaw pain   Latex Itching and Rash    Patient Active Problem List   Diagnosis Date Noted   History of prostate cancer 03/22/2023   Benign neoplasm of descending colon    Benign neoplasm of transverse colon    Situational depression 03/21/2021   Dyslipidemia 09/17/2019   History of diverticulosis 09/17/2019   Chronic sinus complaints 09/17/2019   Essential hypertension 07/03/2018   Pure hypercholesterolemia 07/03/2018   Osteoarthritis of right hip 02/07/2018   Enlarged prostate with urinary retention 12/25/2013   Erectile dysfunction 12/25/2013   Chronic sinusitis 02/20/2012   Frontal mucocele 02/20/2012   Nasal septal deviation 02/20/2012   ADENOCARCINOMA, PROSTATE, GLEASON GRADE 6 12/21/2009   GERD 11/12/2008    Past Medical History:  Diagnosis Date   BPH with obstruction/lower urinary tract symptoms    urologist--- dr alvaro   CAD (coronary artery disease)    cardiologist--- dr c. kate;  per  note native artery;   nuclear stress test 12-13-2020  low risk w/ fixed perfusion defect apex w/ hypokinesis consistant w/ prior infart,  normal wall motion,  nuclear ef 54%;   coronary CT 01-05-2020  score= 767   ED (erectile dysfunction) of organic origin    First degree heart  block    GERD (gastroesophageal reflux disease)    Hiatal hernia    History of colon polyps    2002 and 2003 hyperplastic   History of condyloma acuminatum    penis   History of diverticulitis of colon    History of gastritis    Hypertension    Left bundle branch block    Malignant neoplasm prostate [facility]) 11/2009   urologist--- dr alvaro;   dx 11/ 2011,  gleason 3+3,  completed IMRT 03/ 2012   Penile cancer [facility])    urologist--- dr alvaro ;    recurrent 11/ 2023   Wears glasses    Wears hearing aid in both ears     Past Surgical History:  Procedure Laterality Date   BIOPSY  08/04/2021   Procedure: BIOPSY;  Surgeon: Aneita Gwendlyn DASEN, MD;  Location: [facility];  Service: Gastroenterology;;   BAILEY N/A 06/09/2015   Procedure: CIRCUMCISION ADULT WITH PENILE BLOCK;  Surgeon: Ricardo alvaro, MD;  Location: [facility];  Service: Urology;  Laterality: N/A;   COLONOSCOPY WITH PROPOFOL  N/A 08/04/2021   Procedure: COLONOSCOPY WITH PROPOFOL ;  Surgeon: Aneita Gwendlyn DASEN, MD;  Location: [facility];  Service: Gastroenterology;  Laterality: N/A;   CONDYLOMA EXCISION/FULGURATION N/A 06/09/2020   Procedure: EXCISION OF PENILE WARTS;  Surgeon: alvaro Ricardo, MD;  Location[facility];  Service: Urology;  Laterality: N/A;  45 MINS   ENDOVENOUS ABLATION SAPHENOUS VEIN W/ LASER Left 02/10/2016   endovenous laser ablation left greater saphenous vein and stab phlebectomy left leg by Krystal Doing MD   INGUINAL HERNIA REPAIR Right 12/23/2009   @[facility]  by dr merrilyn   LIPOMA EXCISION N/A 12/01/2016   Procedure: EXCISION POSTERIOR NECK MASS;  Surgeon: Arelia Filippo, MD;  Location: [facility];  Service: Plastics;  Laterality: N/A;   PENILE BIOPSY N/A 12/14/2021   Procedure: LOCAL EXCISION OF PENILE CANCER WITH PENILE BLOCK;  Surgeon: alvaro Ricardo, MD;  Location: [facility];  Service: Urology;  Laterality: N/A;  1 HR   POLYPECTOMY   08/04/2021   Procedure: POLYPECTOMY;  Surgeon: Aneita Gwendlyn DASEN, MD;  Location: [facility];  Service: Gastroenterology;;   SHOULDER ARTHROSCOPY WITH SUBACROMIAL DECOMPRESSION, ROTATOR CUFF REPAIR AND BICEP TENDON REPAIR Left 10/2014    Social History   Socioeconomic History   Marital status: Married    Spouse name: Not on file   Number of children: 0   Years of education: BS   Highest education level: Bachelor's degree (e.g., BA, AB, BS)  Occupational History   Occupation: Psychologist, educational   OccupationEducation officer, environmental  Tobacco Use   Smoking status: Former    Current packs/day: 0.00    Average packs/day: 0.5 packs/day for 2.0 years (1.0 ttl pk-yrs)    Types: Cigarettes    Start date: 09/05/1963    Quit date: 09/04/1965    Years since quitting: 58.0   Smokeless tobacco: Never   Tobacco comments:    quit around 78 yrs old.  Vaping Use   Vaping status: Never Used  Substance and Sexual Activity   Alcohol use: Not Currently    Alcohol/week: 2.0 standard drinks of alcohol    Types: 1 Glasses of wine, 1 Cans of beer per week    Comment: drink 1-2 at night    Drug use: Never   Sexual activity: Yes    Partners: Female  Other Topics Concern   Not on file  Social History Narrative   ** Merged History Encounter **       Originally from [place].   Trained as an Art gallery manager.    Married '75- 11 years, divorced; Married '86. No children.    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 Sculptor-life size figures    by commission (see work [place]  Sculpting work regularly.   Never smoking.   Alcohol: drinks1- 2 beers and 1-2 wines every night;    Learning the piano. Patient get regular exercise.   Wife is a Engineer, civil (consulting).   Caffeine: 3/day    ADLs: drives; independent with ADLs   Advanced Directives: YES; FULL CODE; no prolonged measures.        Social Drivers of Corporate investment banker Strain: Low Risk  (07/23/2023)    Overall Financial Resource Strain (CARDIA)    Difficulty of Paying Living Expenses: Not very hard  Food Insecurity: No Food Insecurity (07/23/2023)   Hunger Vital Sign    Worried About Running Out of Food in the Last Year: Never true    Ran Out of Food in the Last Year: Never true  Transportation Needs: No Transportation Needs (07/23/2023)   PRAPARE - Administrator, Civil Service (Medical): No    Lack of Transportation (Non-Medical): No  Physical Activity: Insufficiently Active (07/23/2023)   Exercise Vital Sign    Days of Exercise per Week: 3 days    Minutes of Exercise per Session: 30 min  Stress: Stress Concern Present (07/23/2023)   [employer] of Occupational Health - Occupational Stress Questionnaire    Feeling of Stress: To some extent  Social Connections: Socially Isolated (07/23/2023)   Social Connection and Isolation Panel    Frequency of Communication with Friends and Family: Once a week    Frequency of Social Gatherings with Friends and Family: Never    Attends Religious Services: Never    Database administrator or Organizations: No    Attends Banker Meetings: Never    Marital Status: Married  Catering manager Violence: Not At Risk (07/23/2023)   Humiliation, Afraid, Rape, and Kick questionnaire    Fear of Current or Ex-Partner: No    Emotionally Abused: No    Physically Abused: No    Sexually Abused: No    Family History  Problem Relation Age of Onset   Cancer Father 7       stomach versus colon cancer   Pancreatic cancer Sister 31   Cancer Brother        penile cancer   Esophageal cancer Neg Hx    Throat cancer Neg Hx      Review of Systems  Constitutional: Negative.  Negative for chills and fever.  HENT: Negative.  Negative for congestion and sore throat.   Respiratory: Negative.  Negative for cough and shortness of breath.   Cardiovascular: Negative.  Negative for chest pain and palpitations.  Gastrointestinal:  Positive  for constipation and diarrhea. Negative for abdominal  pain, blood in stool, melena, nausea and vomiting.  Genitourinary: Negative.  Negative for dysuria and hematuria.  Skin: Negative.  Negative for rash.  Neurological: Negative.  Negative for dizziness and headaches.    Vitals:   09/19/23 0850  BP: 122/64  Pulse: 72  SpO2: 96%    Physical Exam Vitals reviewed.  Constitutional:      Appearance: Normal appearance.  HENT:     Head: Normocephalic.     Mouth/Throat:     Mouth: Mucous membranes are moist.     Pharynx: Oropharynx is clear.  Eyes:     Extraocular Movements: Extraocular movements intact.     Conjunctiva/sclera: Conjunctivae normal.     Pupils: Pupils are equal, round, and reactive to light.  Cardiovascular:     Rate and Rhythm: Normal rate and regular rhythm.     Pulses: Normal pulses.     Heart sounds: Normal heart sounds.  Pulmonary:     Effort: Pulmonary effort is normal.     Breath sounds: Normal breath sounds.  Abdominal:     Palpations: Abdomen is soft.     Tenderness: There is no abdominal tenderness.  Musculoskeletal:     Cervical back: No tenderness.  Lymphadenopathy:     Cervical: No cervical adenopathy.  Skin:    General: Skin is warm and dry.  Neurological:     General: No focal deficit present.     Mental Status: He is alert and oriented to person, place, and time.  Psychiatric:        Mood and Affect: Mood normal.        Behavior: Behavior normal.      ASSESSMENT & PLAN: A total of 42 minutes was spent with the patient and counseling/coordination of care regarding preparing for this visit, review of most recent office visit notes, review of multiple chronic medical conditions and their management, differential diagnosis of bowel dysfunction and need to stay well-hydrated and increase daily fiber intake, review of all medications, review of most recent bloodwork results, review of health maintenance items, education on nutrition, prognosis,  documentation, and need for follow up.   Problem List Items Addressed This Visit       Cardiovascular and Mediastinum   Essential hypertension - Primary   BP Readings from Last 3 Encounters:  09/19/23 122/64  08/30/23 128/76  08/27/23 121/72  Well-controlled hypertension Continue lisinopril  40 mg daily and amlodipine  5 mg daily Cardiovascular risks associated with hypertension discussed       Relevant Orders   Comprehensive metabolic panel with GFR   CBC with Differential/Platelet   Hemoglobin A1c   Lipid panel   Vitamin B12   VITAMIN D  25 Hydroxy (Vit-D Deficiency, Fractures)     Respiratory   Chronic sinusitis   Stable chronic condition Uses Flonase  as needed No recent flareup        Digestive   GERD   Asymptomatic and well-controlled. Continues omeprazole  20 mg as needed       Relevant Orders   CBC with Differential/Platelet   Bowel dysfunction   Diet related. Diet and nutrition discussed Advised to increase daily fiber intake and take daily fiber supplement May benefit from probiotics Advised to stay well-hydrated At present time would avoid laxative but may take stool softeners        Genitourinary   ADENOCARCINOMA, PROSTATE, GLEASON GRADE 6     Other   Dyslipidemia   Chronic stable condition Continue atorvastatin  20 mg daily  Relevant Orders   Comprehensive metabolic panel with GFR   Hemoglobin A1c   Lipid panel   History of prostate cancer   Stable.  Has occasional minimal leakage. No concerns.  Follows up with urologist on a regular basis      Relevant Orders   PSA   Patient Instructions  Health Maintenance After Age 37 After age 34, you are at a higher risk for certain long-term diseases and infections as well as injuries from falls. Falls are a major cause of broken bones and head injuries in people who are older than age 30. Getting regular preventive care can help to keep you healthy and well. Preventive care includes getting  regular testing and making lifestyle changes as recommended by your health care provider. Talk with your health care provider about: Which screenings and tests you should have. A screening is a test that checks for a disease when you have no symptoms. A diet and exercise plan that is right for you. What should I know about screenings and tests to prevent falls? Screening and testing are the best ways to find a health problem early. Early diagnosis and treatment give you the best chance of managing medical conditions that are common after age 8. Certain conditions and lifestyle choices may make you more likely to have a fall. Your health care provider may recommend: Regular vision checks. Poor vision and conditions such as cataracts can make you more likely to have a fall. If you wear glasses, make sure to get your prescription updated if your vision changes. Medicine review. Work with your health care provider to regularly review all of the medicines you are taking, including over-the-counter medicines. Ask your health care provider about any side effects that may make you more likely to have a fall. Tell your health care provider if any medicines that you take make you feel dizzy or sleepy. Strength and balance checks. Your health care provider may recommend certain tests to check your strength and balance while standing, walking, or changing positions. Foot health exam. Foot pain and numbness, as well as not wearing proper footwear, can make you more likely to have a fall. Screenings, including: Osteoporosis screening. Osteoporosis is a condition that causes the bones to get weaker and break more easily. Blood pressure screening. Blood pressure changes and medicines to control blood pressure can make you feel dizzy. Depression screening. You may be more likely to have a fall if you have a fear of falling, feel depressed, or feel unable to do activities that you used to do. Alcohol use screening.  Using too much alcohol can affect your balance and may make you more likely to have a fall. Follow these instructions at home: Lifestyle Do not drink alcohol if: Your health care provider tells you not to drink. If you drink alcohol: Limit how much you have to: 0-1 drink a day for women. 0-2 drinks a day for men. Know how much alcohol is in your drink. In the [place] one drink equals one 12 oz bottle of beer (355 mL), one 5 oz glass of wine (148 mL), or one 1 oz glass of hard liquor (44 mL). Do not use any products that contain nicotine or tobacco. These products include cigarettes, chewing tobacco, and vaping devices, such as e-cigarettes. If you need help quitting, ask your health care provider. Activity  Follow a regular exercise program to stay fit. This will help you maintain your balance. Ask your health care provider what types of  exercise are appropriate for you. If you need a cane or walker, use it as recommended by your health care provider. Wear supportive shoes that have nonskid soles. Safety  Remove any tripping hazards, such as rugs, cords, and clutter. Install safety equipment such as grab bars in bathrooms and safety rails on stairs. Keep rooms and walkways well-lit. General instructions Talk with your health care provider about your risks for falling. Tell your health care provider if: You fall. Be sure to tell your health care provider about all falls, even ones that seem minor. You feel dizzy, tiredness (fatigue), or off-balance. Take over-the-counter and prescription medicines only as told by your health care provider. These include supplements. Eat a healthy diet and maintain a healthy weight. A healthy diet includes low-fat dairy products, low-fat (lean) meats, and fiber from whole grains, beans, and lots of fruits and vegetables. Stay current with your vaccines. Schedule regular health, dental, and eye exams. Summary Having a healthy lifestyle and getting  preventive care can help to protect your health and wellness after age 25. Screening and testing are the best way to find a health problem early and help you avoid having a fall. Early diagnosis and treatment give you the best chance for managing medical conditions that are more common for people who are older than age 21. Falls are a major cause of broken bones and head injuries in people who are older than age 62. Take precautions to prevent a fall at home. Work with your health care provider to learn what changes you can make to improve your health and wellness and to prevent falls. This information is not intended to replace advice given to you by your health care provider. Make sure you discuss any questions you have with your health care provider. Document Revised: 05/31/2020 Document Reviewed: 05/31/2020 Elsevier Patient Education  2024 Elsevier Inc.      Emil Schaumann, MD [facility]

## 2023-09-19 NOTE — Assessment & Plan Note (Signed)
Chronic stable condition Continue atorvastatin 20 mg daily.

## 2023-09-19 NOTE — Assessment & Plan Note (Signed)
 Stable chronic condition Uses Flonase  as needed No recent flareup

## 2023-09-19 NOTE — Assessment & Plan Note (Signed)
Asymptomatic and well-controlled.  Continues omeprazole 20 mg as needed

## 2023-09-19 NOTE — Assessment & Plan Note (Signed)
 BP Readings from Last 3 Encounters:  09/19/23 122/64  08/30/23 128/76  08/27/23 121/72  Well-controlled hypertension Continue lisinopril  40 mg daily and amlodipine  5 mg daily Cardiovascular risks associated with hypertension discussed

## 2023-09-19 NOTE — Assessment & Plan Note (Signed)
 Stable.  Has occasional minimal leakage. No concerns.  Follows up with urologist on a regular basis

## 2023-09-24 ENCOUNTER — Ambulatory Visit (HOSPITAL_COMMUNITY): Payer: Self-pay | Admitting: Internal Medicine

## 2023-09-24 ENCOUNTER — Ambulatory Visit (HOSPITAL_COMMUNITY)
Admission: RE | Admit: 2023-09-24 | Discharge: 2023-09-24 | Disposition: A | Discharge status: Home / Self Care | Source: Ambulatory Visit | Attending: Internal Medicine | Admitting: Internal Medicine

## 2023-09-24 ENCOUNTER — Ambulatory Visit (INDEPENDENT_AMBULATORY_CARE_PROVIDER_SITE_OTHER)

## 2023-09-24 ENCOUNTER — Encounter (HOSPITAL_COMMUNITY): Payer: Self-pay

## 2023-09-24 VITALS — BP 147/90 | HR 80 | Temp 98.0°F | Resp 16

## 2023-09-24 DIAGNOSIS — M25562 Pain in left knee: Secondary | ICD-10-CM | POA: Diagnosis not present

## 2023-09-24 MED ORDER — MELOXICAM 15 MG PO TABS: 15.0000 mg | ORAL_TABLET | Freq: Every day | ORAL | 0 refills | Status: AC

## 2023-09-24 NOTE — Discharge Instructions (Addendum)
 Your knee xray shows narrowing space of the joint area where you are hurting and I suspect you have arthritis there. The radiologist will finish the final reading in a couple of hours and if he finds anything different I will call you. In the mean time I will have you try some arthritis medication to help the pain. If in a couple of day you are not Improving much, then go to [facility] to get a steroid injection in your joint, so you can endure your long drive.

## 2023-09-24 NOTE — ED Provider Notes (Signed)
 [facility]    CSN: [id number] Arrival date & time: 09/24/23  1213      History   Chief Complaint Chief Complaint  Patient presents with   Knee Pain    Recent sudden onset pain Left knee - Entered by patient    HPI Oscar Smith is a 78 y.o. male presents due to having  L medial knee pain x 3 days. Yesterday he had swelling all the way down to his foot, but is not swollen today. Denies any injury. Has been wearing a knee brace and support sock. States that 5 days ago woke up with mild ache of his L knee, so when he went to see his PCP for his annual he was wearing a knee brace, then later on the pain started getting worse. He will be driving to [facility] this week and wants to be checked before he leaves.     Past Medical History:  Diagnosis Date   BPH with obstruction/lower urinary tract symptoms    urologist--- dr alvaro   CAD (coronary artery disease)    cardiologist--- dr c. kate;  per  note native artery;   nuclear stress test 12-13-2020  low risk w/ fixed perfusion defect apex w/ hypokinesis consistant w/ prior infart,  normal wall motion,  nuclear ef 54%;   coronary CT 01-05-2020  score= 767   ED (erectile dysfunction) of organic origin    First degree heart block    GERD (gastroesophageal reflux disease)    Hiatal hernia    History of colon polyps    2002 and 2003 hyperplastic   History of condyloma acuminatum    penis   History of diverticulitis of colon    History of gastritis    Hypertension    Left bundle branch block    Malignant neoplasm prostate (HCC) 11/2009   urologist--- dr alvaro;   dx 11/ 2011,  gleason 3+3,  completed IMRT 03/ 2012   Penile cancer [facility])    urologist--- dr alvaro ;    recurrent 11/ 2023   Wears glasses    Wears hearing aid in both ears     Patient Active Problem List   Diagnosis Date Noted   Bowel dysfunction 09/19/2023   History of prostate cancer 03/22/2023   Benign neoplasm of descending colon    Benign  neoplasm of transverse colon    Situational depression 03/21/2021   Dyslipidemia 09/17/2019   History of diverticulosis 09/17/2019   Chronic sinus complaints 09/17/2019   Essential hypertension 07/03/2018   Pure hypercholesterolemia 07/03/2018   Osteoarthritis of right hip 02/07/2018   Enlarged prostate with urinary retention 12/25/2013   Erectile dysfunction 12/25/2013   Chronic sinusitis 02/20/2012   Frontal mucocele 02/20/2012   Nasal septal deviation 02/20/2012   ADENOCARCINOMA, PROSTATE, GLEASON GRADE 6 12/21/2009   GERD 11/12/2008    Past Surgical History:  Procedure Laterality Date   BIOPSY  08/04/2021   Procedure: BIOPSY;  Surgeon: Aneita Gwendlyn DASEN, MD;  Location: [facility];  Service: Gastroenterology;;   BAILEY N/A 06/09/2015   Procedure: CIRCUMCISION ADULT WITH PENILE BLOCK;  Surgeon: Ricardo alvaro, MD;  Location: [facility];  Service: Urology;  Laterality: N/A;   COLONOSCOPY WITH PROPOFOL  N/A 08/04/2021   Procedure: COLONOSCOPY WITH PROPOFOL ;  Surgeon: Aneita Gwendlyn DASEN, MD;  Location: [facility];  Service: Gastroenterology;  Laterality: N/A;   CONDYLOMA EXCISION/FULGURATION N/A 06/09/2020   Procedure: EXCISION OF PENILE WARTS;  Surgeon: alvaro Ricardo, MD;  Location: [facility]  [place];  Service: Urology;  Laterality: N/A;  45 MINS   ENDOVENOUS ABLATION SAPHENOUS VEIN W/ LASER Left 02/10/2016   endovenous laser ablation left greater saphenous vein and stab phlebectomy left leg by Krystal Doing MD   INGUINAL HERNIA REPAIR Right 12/23/2009   @[facility]  by dr merrilyn   LIPOMA EXCISION N/A 12/01/2016   Procedure: EXCISION POSTERIOR NECK MASS;  Surgeon: Arelia Filippo, MD;  Location: [facility];  Service: Plastics;  Laterality: N/A;   PENILE BIOPSY N/A 12/14/2021   Procedure: LOCAL EXCISION OF PENILE CANCER WITH PENILE BLOCK;  Surgeon: Alvaro Hummer, MD;  Location: [facility];  Service: Urology;  Laterality: N/A;  1  HR   POLYPECTOMY  08/04/2021   Procedure: POLYPECTOMY;  Surgeon: Aneita Gwendlyn DASEN, MD;  Location: [facility];  Service: Gastroenterology;;   SHOULDER ARTHROSCOPY WITH SUBACROMIAL DECOMPRESSION, ROTATOR CUFF REPAIR AND BICEP TENDON REPAIR Left 10/2014       Home Medications    Prior to Admission medications   Medication Sig Start Date End Date Taking? Authorizing Provider  meloxicam  (MOBIC ) 15 MG tablet Take 1 tablet (15 mg total) by mouth daily. 09/24/23  Yes Rodriguez-Southworth, Amberle Lyter, PA-C  amLODipine  (NORVASC ) 5 MG tablet TAKE 1 TABLET BY MOUTH TWICE DAILY. 01/30/23   Kate Lonni CROME, MD  aspirin EC 81 MG tablet Take 81 mg daily by mouth.    [provider]  atorvastatin  (LIPITOR) 20 MG tablet TAKE ONE TABLET ONCE DAILY 02/13/23   Kate Lonni CROME, MD  diphenhydramine-acetaminophen  (TYLENOL  PM) 25-500 MG TABS tablet Take 1 tablet by mouth at bedtime as needed (for sleep).    [provider]  esomeprazole  (NEXIUM ) 40 MG capsule Take 1 capsule (40 mg total) by mouth daily. 06/04/23   Purcell Emil Schanz, MD  fluticasone  (FLONASE ) 50 MCG/ACT nasal spray Place 2 sprays into both nostrils daily as needed for allergies or rhinitis. 07/15/23   Sagardia, Miguel Jose, MD  hydrocortisone 2.5 % cream Apply topically as needed.    [provider]  lisinopril  (ZESTRIL ) 40 MG tablet TAKE ONE TABLET BY MOUTH DAILY 08/21/23   Sagardia, Miguel Jose, MD  LORazepam  (ATIVAN ) 1 MG tablet Take 1 tablet (1 mg total) by mouth daily as needed for anxiety. 06/29/23   Sagardia, Miguel Jose, MD  Multiple Vitamin (MULTIVITAMIN) tablet Take 1 tablet by mouth daily.    [provider]    Family History Family History  Problem Relation Age of Onset   Cancer Father 68       stomach versus colon cancer   Pancreatic cancer Sister 62   Cancer Brother        penile cancer   Esophageal cancer Neg Hx    Throat cancer Neg Hx     Social History Social History    Tobacco Use   Smoking status: Former    Current packs/day: 0.00    Average packs/day: 0.5 packs/day for 2.0 years (1.0 ttl pk-yrs)    Types: Cigarettes    Start date: 09/05/1963    Quit date: 09/04/1965    Years since quitting: 58.0   Smokeless tobacco: Never   Tobacco comments:    quit around 78 yrs old.  Vaping Use   Vaping status: Never Used  Substance Use Topics   Alcohol use: Yes    Alcohol/week: 2.0 standard drinks of alcohol    Types: 1 Glasses of wine, 1 Cans of beer per week    Comment: drink 1-2 at night  Drug use: Never     Allergies   Flomax [tamsulosin hcl] and Latex   Review of Systems Review of Systems As noted in HPI  Physical Exam Triage Vital Signs ED Triage Vitals [09/24/23 1249]  Encounter Vitals Group     BP (!) 147/90     Girls Systolic BP Percentile      Girls Diastolic BP Percentile      Boys Systolic BP Percentile      Boys Diastolic BP Percentile      Pulse Rate 80     Resp 16     Temp 98 F (36.7 C)     Temp Source Oral     SpO2 96 %     Weight      Height      Head Circumference      Peak Flow      Pain Score 8     Pain Loc      Pain Education      Exclude from Growth Chart    No data found.  Updated Vital Signs BP (!) 147/90 (BP Location: Left Arm)   Pulse 80   Temp 98 F (36.7 C) (Oral)   Resp 16   SpO2 96%   Visual Acuity Right Eye Distance:   Left Eye Distance:   Bilateral Distance:    Right Eye Near:   Left Eye Near:    Bilateral Near:     Physical Exam Vitals and nursing note reviewed.  Constitutional:      General: He is not in acute distress.    Appearance: He is not toxic-appearing.  HENT:     Right Ear: External ear normal.     Left Ear: External ear normal.  Eyes:     General: No scleral icterus.    Conjunctiva/sclera: Conjunctivae normal.  Cardiovascular:     Pulses: Normal pulses.  Pulmonary:     Effort: Pulmonary effort is normal.  Musculoskeletal:        General: Tenderness present.  No swelling or deformity.     Cervical back: Neck supple.     Right lower leg: No edema.     Left lower leg: No edema.     Comments: L KNEE- with no deformity or swelling. Has point tenderness on medial knee over his tibia, and medial joint line. ROM is normal, but fully straightening it, provokes more pain on medial knee.   Skin:    General: Skin is warm and dry.     Findings: No bruising, erythema or rash.  Neurological:     Mental Status: He is alert and oriented to person, place, and time.     Gait: Gait normal.  Psychiatric:        Mood and Affect: Mood normal.        Behavior: Behavior normal.        Thought Content: Thought content normal.      UC Treatments / Results  Labs (all labs ordered are listed, but only abnormal results are displayed) Labs Reviewed - No data to display  EKG   Radiology No results found.  Procedures Procedures (including critical care time)  Medications Ordered in UC Medications - No data to display  Initial Impression / Assessment and Plan / UC Course  I have reviewed the triage vital signs and the nursing notes.  Pertinent  imaging results that were available during my care of the patient were reviewed by me and considered in my medical decision making (  see chart for details).  Medial knee L pain, more likely from OA  I placed him on Mobic  as needed and advised to FU with [facility] on Wednesday if not improved by 50%.    Final Clinical Impressions(s) / UC Diagnoses   Final diagnoses:  Medial knee pain, left     Discharge Instructions      Your knee xray shows narrowing space of the joint area where you are hurting and I suspect you have arthritis there. The radiologist will finish the final reading in a couple of hours and if he finds anything different I will call you. In the mean time I will have you try some arthritis medication to help the pain. If in a couple of day you are not Improving much, then go to [facility] to  get a steroid injection in your joint, so you can endure your long drive.      ED Prescriptions     Medication Sig Dispense Auth. Provider   meloxicam  (MOBIC ) 15 MG tablet Take 1 tablet (15 mg total) by mouth daily. 7 tablet Rodriguez-Southworth, Kyra, PA-C      PDMP not reviewed this encounter.   Lindi Kyra, PA-C 09/24/23 1355

## 2023-09-24 NOTE — ED Triage Notes (Signed)
 Patient c/o left knee pain x 3 days. Patient marked his left knee where he is having pain. Patient states he had swelling to the left foot yesterday, but not today. Patient denies any injury.   Patient has been wearing a knee brace and wearing a support sock.

## 2023-09-25 ENCOUNTER — Other Ambulatory Visit: Payer: Self-pay | Admitting: Emergency Medicine

## 2023-09-25 ENCOUNTER — Encounter: Payer: Self-pay | Admitting: Emergency Medicine

## 2023-09-25 DIAGNOSIS — M1711 Unilateral primary osteoarthritis, right knee: Secondary | ICD-10-CM | POA: Diagnosis not present

## 2023-11-06 DIAGNOSIS — H25011 Cortical age-related cataract, right eye: Secondary | ICD-10-CM | POA: Diagnosis not present

## 2023-11-06 DIAGNOSIS — H25811 Combined forms of age-related cataract, right eye: Secondary | ICD-10-CM | POA: Diagnosis not present

## 2023-11-06 DIAGNOSIS — H2511 Age-related nuclear cataract, right eye: Secondary | ICD-10-CM | POA: Diagnosis not present

## 2023-11-20 DIAGNOSIS — H25812 Combined forms of age-related cataract, left eye: Secondary | ICD-10-CM | POA: Diagnosis not present

## 2023-11-20 DIAGNOSIS — H2512 Age-related nuclear cataract, left eye: Secondary | ICD-10-CM | POA: Diagnosis not present

## 2023-11-20 DIAGNOSIS — H25012 Cortical age-related cataract, left eye: Secondary | ICD-10-CM | POA: Diagnosis not present

## 2023-11-26 ENCOUNTER — Encounter: Payer: Self-pay | Admitting: Radiology

## 2023-12-03 NOTE — Progress Notes (Unsigned)
 Cardiology Office Note:    Date:  12/07/2023   ID:  Oscar Smith, DOB 1945/03/25, MRN [id number]  PCP:  Purcell Emil Schanz, MD  Cardiologist:  None  Electrophysiologist:  None   Referring MD: Purcell Emil Schanz, *   Chief Complaint  Patient presents with   Dizziness    History of Present Illness:    Oscar Smith is a 78 y.o. male with a hx of prostate cancer, hypertension who presents for follow-up.  He was referred by Dr. Sagardia for evaluation of left bundle branch block, initially seen on 05/09/2019.  He went to the ED on 05/06/2019 for dizziness.  Felt to be consistent with vertigo.  However work-up notable for new left bundle branch block.  High-sensitivity troponins 11->19->20.  Walks 3-4 times per week for about 25 minutes.  No chest pain, dyspnea, or syncope.  Does report intermittent episodes where it feels like heart is racing, occurs 1-2 times per month.  Can last minutes.  TTE on 05/27/19 showed LVEF 55 to 60%, grade 1 diastolic dysfunction, normal RV systolic function no significant valvular disease.  Calcium  score 767 on 01/05/2020 (71st percentile).  Lexiscan  Myoview  on 12/13/2020 showed fixed inferior perfusion defect with normal wall motion consistent with artifact, fixed perfusion defect at apex with hypokinesis consistent with infarct; low risk study, no evidence of ischemia.  Echocardiogram 12/2020 showed EF 55 to 60%, G1 DD, normal RV function, no significant valvular disease, echodensity in right atrium consistent with prominent crista terminalis.  Zio patch x14 days 12/28/2020 showed no significant arrhythmias.  Echocardiogram 12/2022 showed EF 50 to 55%, moderate LVH, normal RV function, no significant valvular disease.  Since last clinic visit, he reports he is doing okay.  Does report he has been getting tired in the afternoon.  But denies any chest pain or dyspnea.  He denies any lightheadedness, syncope, or palpitations.  Does report some lower  extremity edema.  He walks at least once per week for 30 minutes, denies any exertional symptoms.  Past Medical History:  Diagnosis Date   BPH with obstruction/lower urinary tract symptoms    urologist--- dr alvaro   CAD (coronary artery disease)    cardiologist--- dr c. kate;  per  note native artery;   nuclear stress test 12-13-2020  low risk w/ fixed perfusion defect apex w/ hypokinesis consistant w/ prior infart,  normal wall motion,  nuclear ef 54%;   coronary CT 01-05-2020  score= 767   ED (erectile dysfunction) of organic origin    First degree heart block    GERD (gastroesophageal reflux disease)    Hiatal hernia    History of colon polyps    2002 and 2003 hyperplastic   History of condyloma acuminatum    penis   History of diverticulitis of colon    History of gastritis    Hypertension    Left bundle branch block    Malignant neoplasm prostate [facility]) 11/2009   urologist--- dr alvaro;   dx 11/ 2011,  gleason 3+3,  completed IMRT 03/ 2012   Penile cancer [facility])    urologist--- dr alvaro ;    recurrent 11/ 2023   Wears glasses    Wears hearing aid in both ears     Past Surgical History:  Procedure Laterality Date   BIOPSY  08/04/2021   Procedure: BIOPSY;  Surgeon: Aneita Gwendlyn DASEN, MD;  Location: [facility];  Service: Gastroenterology;;   BAILEY N/A 06/09/2015   Procedure: CIRCUMCISION ADULT  WITH PENILE BLOCK;  Surgeon: Ricardo Likens, MD;  Location: [facility];  Service: Urology;  Laterality: N/A;   COLONOSCOPY WITH PROPOFOL  N/A 08/04/2021   Procedure: COLONOSCOPY WITH PROPOFOL ;  Surgeon: Aneita Gwendlyn DASEN, MD;  Location: [facility];  Service: Gastroenterology;  Laterality: N/A;   CONDYLOMA EXCISION/FULGURATION N/A 06/09/2020   Procedure: EXCISION OF PENILE WARTS;  Surgeon: Likens Ricardo, MD;  Location: [facility];  Service: Urology;  Laterality: N/A;  45 MINS   ENDOVENOUS ABLATION SAPHENOUS VEIN W/ LASER Left 02/10/2016    endovenous laser ablation left greater saphenous vein and stab phlebectomy left leg by Krystal Doing MD   INGUINAL HERNIA REPAIR Right 12/23/2009   @[facility]  by dr merrilyn   LIPOMA EXCISION N/A 12/01/2016   Procedure: EXCISION POSTERIOR NECK MASS;  Surgeon: Arelia Filippo, MD;  Location: [facility];  Service: Plastics;  Laterality: N/A;   PENILE BIOPSY N/A 12/14/2021   Procedure: LOCAL EXCISION OF PENILE CANCER WITH PENILE BLOCK;  Surgeon: Likens Ricardo, MD;  Location: [facility];  Service: Urology;  Laterality: N/A;  1 HR   POLYPECTOMY  08/04/2021   Procedure: POLYPECTOMY;  Surgeon: Aneita Gwendlyn DASEN, MD;  Location: [facility];  Service: Gastroenterology;;   SHOULDER ARTHROSCOPY WITH SUBACROMIAL DECOMPRESSION, ROTATOR CUFF REPAIR AND BICEP TENDON REPAIR Left 10/2014    Current Medications: Current Meds  Medication Sig   amLODipine  (NORVASC ) 5 MG tablet TAKE 1 TABLET BY MOUTH TWICE DAILY.   aspirin EC 81 MG tablet Take 81 mg daily by mouth.   atorvastatin  (LIPITOR) 20 MG tablet TAKE ONE TABLET ONCE DAILY   diphenhydramine-acetaminophen  (TYLENOL  PM) 25-500 MG TABS tablet Take 1 tablet by mouth at bedtime as needed (for sleep).   esomeprazole  (NEXIUM ) 40 MG capsule Take 1 capsule (40 mg total) by mouth daily.   fluticasone  (FLONASE ) 50 MCG/ACT nasal spray Place 2 sprays into both nostrils daily as needed for allergies or rhinitis.   hydrocortisone 2.5 % cream Apply topically as needed.   lisinopril  (ZESTRIL ) 40 MG tablet TAKE ONE TABLET BY MOUTH DAILY   LORazepam  (ATIVAN ) 1 MG tablet Take 1 tablet (1 mg total) by mouth daily as needed for anxiety.   meloxicam  (MOBIC ) 15 MG tablet Take 1 tablet (15 mg total) by mouth daily.   Multiple Vitamin (MULTIVITAMIN) tablet Take 1 tablet by mouth daily.     Allergies:   Flomax [tamsulosin hcl] and Latex   Social History   Socioeconomic History   Marital status: Married    Spouse name: Not on file   Number of children:  0   Years of education: BS   Highest education level: Bachelor's degree (e.g., BA, AB, BS)  Occupational History   Occupation: PSYCHOLOGIST, EDUCATIONAL   Occupationeducation officer, environmental  Tobacco Use   Smoking status: Former    Current packs/day: 0.00    Average packs/day: 0.5 packs/day for 2.0 years (1.0 ttl pk-yrs)    Types: Cigarettes    Start date: 09/05/1963    Quit date: 09/04/1965    Years since quitting: 58.2   Smokeless tobacco: Never   Tobacco comments:    quit around 78 yrs old.  Vaping Use   Vaping status: Never Used  Substance and Sexual Activity   Alcohol use: Yes    Alcohol/week: 2.0 standard drinks of alcohol    Types: 1 Glasses of wine, 1 Cans of beer per week    Comment: drink 1-2 at night    Drug use: Never  Sexual activity: Yes    Partners: Female  Other Topics Concern   Not on file  Social History Narrative   ** Merged History Encounter **       Originally from [place].   Trained as an art gallery manager.    Married '75- 11 years, divorced; Married '86. No children.    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 Sculptor-life size figures    by commission (see work [place]  Sculpting work regularly.   Never smoking.   Alcohol: drinks1- 2 beers and 1-2 wines every night;    Learning the piano. Patient get regular exercise.   Wife is a engineer, civil (consulting).   Caffeine: 3/day    ADLs: drives; independent with ADLs   Advanced Directives: YES; FULL CODE; no prolonged measures.        Social Drivers of Corporate Investment Banker Strain: Low Risk  (07/23/2023)   Overall Financial Resource Strain (CARDIA)    Difficulty of Paying Living Expenses: Not very hard  Food Insecurity: No Food Insecurity (07/23/2023)   Hunger Vital Sign    Worried About Running Out of Food in the Last Year: Never true    Ran Out of Food in the Last Year: Never true  Transportation Needs: No Transportation Needs (07/23/2023)   PRAPARE -  Administrator, Civil Service (Medical): No    Lack of Transportation (Non-Medical): No  Physical Activity: Insufficiently Active (07/23/2023)   Exercise Vital Sign    Days of Exercise per Week: 3 days    Minutes of Exercise per Session: 30 min  Stress: Stress Concern Present (07/23/2023)   [employer] of Occupational Health - Occupational Stress Questionnaire    Feeling of Stress: To some extent  Social Connections: Socially Isolated (07/23/2023)   Social Connection and Isolation Panel    Frequency of Communication with Friends and Family: Once a week    Frequency of Social Gatherings with Friends and Family: Never    Attends Religious Services: Never    Database Administrator or Organizations: No    Attends Engineer, Structural: Never    Marital Status: Married     Family History: The patient'sfamily history includes Cancer in his brother; Cancer (age of onset: 87) in his father; Pancreatic cancer (age of onset: 57) in his sister. There is no history of Esophageal cancer or Throat cancer.  ROS:   Please see the history of present illness.    All other systems reviewed and are negative.  EKGs/Labs/Other Studies Reviewed:    The following studies were reviewed today:  EKG:   12/07/2023: Sinus rhythm, first-degree AV block, left bundle branch block, rate 73 12/05/2022: Sinus rhythm, first-degree AV block, left bundle branch block, rate 72 11/15/2021: Sinus rhythm, first-degree AV block, left bundle branch block, rate 78 12/02/2020: Sinus rhythm with first-degree AV block, left bundle branch block, rate 67   TTE 05/27/19:  1. Left ventricular ejection fraction, by estimation, is 55 to 60%. The  left ventricle has normal function. The left ventricle has no regional  wall motion abnormalities. Left ventricular diastolic parameters are  consistent with Grade I diastolic  dysfunction (impaired relaxation).   2. Right ventricular systolic function is normal.  The right ventricular  size is mildly enlarged.   3. Left atrial size was mildly dilated.   4. The mitral valve is normal in structure. Trivial mitral valve  regurgitation. No evidence of  mitral stenosis.   5. The aortic valve is tricuspid. Aortic valve regurgitation is not  visualized. Mild aortic valve sclerosis is present, with no evidence of  aortic valve stenosis.   6. The inferior vena cava is normal in size with greater than 50%  respiratory variability, suggesting right atrial pressure of 3 mmHg.   Recent Labs: 09/19/2023: ALT 23; BUN 13; Creatinine, Ser 0.80; Hemoglobin 14.2; Platelets 214.0; Potassium 4.2; Sodium 132  Recent Lipid Panel    Component Value Date/Time   CHOL 114 09/19/2023 1001   CHOL 153 09/17/2019 0957   TRIG 128.0 09/19/2023 1001   HDL 40.50 09/19/2023 1001   HDL 46 09/17/2019 0957   CHOLHDL 3 09/19/2023 1001   VLDL 25.6 09/19/2023 1001   LDLCALC 48 09/19/2023 1001   LDLCALC 90 09/17/2019 0957   LDLDIRECT 68.0 09/27/2021 1326    Physical Exam:    VS:  BP 112/60 (BP Location: Left Arm, Patient Position: Sitting, Cuff Size: Normal)   Pulse 73   Ht 5' 10 (1.778 m)   Wt 189 lb (85.7 kg)   SpO2 95%   BMI 27.12 kg/m     Wt Readings from Last 3 Encounters:  12/07/23 189 lb (85.7 kg)  09/19/23 185 lb (83.9 kg)  08/30/23 189 lb (85.7 kg)     GEN: Well nourished, well developed in no acute distress HEENT: Normal NECK: No JVD CARDIAC: RRR, no murmurs, rubs, gallops RESPIRATORY:  Clear to auscultation without rales, wheezing or rhonchi  ABDOMEN: Soft, non-tender, non-distended MUSCULOSKELETAL:  Trace edema SKIN: Warm and dry NEUROLOGIC:  Alert and oriented x 3 PSYCHIATRIC:  Normal affect   ASSESSMENT:    1. Coronary artery disease involving native coronary artery of native heart without angina pectoris   2. Nonspecific abnormal electrocardiogram (ECG) (EKG)   3. Essential hypertension   4. Lower extremity edema   5. Hyperlipidemia,  unspecified hyperlipidemia type      PLAN:    CAD:  Calcium  score 767 on 01/05/2020 (71st percentile).  Lexiscan  Myoview  on 12/13/2020 showed fixed inferior perfusion defect with normal wall motion consistent with artifact, fixed perfusion defect at apex with hypokinesis consistent with infarct; low risk study, no evidence of ischemia.   -Continue aspirin 81 mg daily -Continue atorvastatin  20 mg daily  LE edema: Reported issues with lower extremity edema at clinic visit 11/2022.  Trace on exam.  BNP, CMET normal.  Echo shows EF 50 to 55% 12/2022  Left bundle branch block: Echocardiogram 12/2020 showed EF 55 to 60%, G1 DD, normal RV function, no significant valvular disease, echodensity in right atrium consistent with prominent crista terminalis.  Echocardiogram 12/2022 showed EF 50 to 55%, moderate LVH, normal RV function, no significant valvular disease.  Palpitations: Zio patch x14 days 12/28/2020 showed no significant arrhythmias.  Hypertension: On lisinopril  40 mg daily and amlodipine  5 mg twice daily.  Appears controlled  Hyperlipidemia: On atorvastatin  20 mg daily.  LDL 74 on 09/19/2022.  Calcium  score 767 on 01/05/2020 (71st percentile)  Snoring/daytime somnolence: Normal sleep study 08/2019   RTC in 1 year      Medication Adjustments/Labs and Tests Ordered: Current medicines are reviewed at length with the patient today.  Concerns regarding medicines are outlined above.  Orders Placed This Encounter  Procedures   EKG 12-Lead    No orders of the defined types were placed in this encounter.    Patient Instructions  Medication Instructions:  none *If you need a refill on your cardiac medications before  your next appointment, please call your pharmacy*  Lab Work: none If you have labs (blood work) drawn today and your tests are completely normal, you will receive your results only by: MyChart Message (if you have MyChart) OR A paper copy in the mail If you have any  lab test that is abnormal or we need to change your treatment, we will call you to review the results.  Testing/Procedures: none  Follow-Up: At [facility], you and your health needs are our priority.  As part of our continuing mission to provide you with exceptional heart care, our providers are all part of one team.  This team includes your primary Cardiologist (physician) and Advanced Practice Providers or APPs (Physician Assistants and Nurse Practitioners) who all work together to provide you with the care you need, when you need it.  Your next appointment:   1 year(s)  Provider:   Dr. Lonni Nanas    Other Instructions none           Signed, Lonni LITTIE Nanas, MD  12/07/2023 5:31 PM    [facility]

## 2023-12-07 ENCOUNTER — Ambulatory Visit: Attending: Cardiology | Admitting: Cardiology

## 2023-12-07 VITALS — BP 112/60 | HR 73 | Ht 70.0 in | Wt 189.0 lb

## 2023-12-07 DIAGNOSIS — R600 Localized edema: Secondary | ICD-10-CM | POA: Diagnosis not present

## 2023-12-07 DIAGNOSIS — I10 Essential (primary) hypertension: Secondary | ICD-10-CM | POA: Diagnosis not present

## 2023-12-07 DIAGNOSIS — I2510 Atherosclerotic heart disease of native coronary artery without angina pectoris: Secondary | ICD-10-CM | POA: Diagnosis not present

## 2023-12-07 DIAGNOSIS — E785 Hyperlipidemia, unspecified: Secondary | ICD-10-CM

## 2023-12-07 DIAGNOSIS — R9431 Abnormal electrocardiogram [ECG] [EKG]: Secondary | ICD-10-CM

## 2023-12-07 NOTE — Patient Instructions (Signed)
 Medication Instructions:  none *If you need a refill on your cardiac medications before your next appointment, please call your pharmacy*  Lab Work: none If you have labs (blood work) drawn today and your tests are completely normal, you will receive your results only by: MyChart Message (if you have MyChart) OR A paper copy in the mail If you have any lab test that is abnormal or we need to change your treatment, we will call you to review the results.  Testing/Procedures: none  Follow-Up: At [facility], you and your health needs are our priority.  As part of our continuing mission to provide you with exceptional heart care, our providers are all part of one team.  This team includes your primary Cardiologist (physician) and Advanced Practice Providers or APPs (Physician Assistants and Nurse Practitioners) who all work together to provide you with the care you need, when you need it.  Your next appointment:   1 year(s)  Provider:   Dr. Lonni Nanas    Other Instructions none

## 2023-12-17 ENCOUNTER — Encounter: Payer: Self-pay | Admitting: Emergency Medicine

## 2023-12-19 ENCOUNTER — Ambulatory Visit: Admitting: Emergency Medicine

## 2023-12-19 ENCOUNTER — Encounter: Payer: Self-pay | Admitting: Emergency Medicine

## 2023-12-27 ENCOUNTER — Encounter: Payer: Self-pay | Admitting: Emergency Medicine

## 2023-12-27 NOTE — Telephone Encounter (Signed)
 Yes you can call the office and make an appointment

## 2023-12-31 ENCOUNTER — Encounter: Payer: Self-pay | Admitting: Emergency Medicine

## 2023-12-31 ENCOUNTER — Ambulatory Visit: Admitting: Emergency Medicine

## 2023-12-31 VITALS — BP 126/80 | HR 67 | Temp 97.7°F | Ht 70.0 in | Wt 191.0 lb

## 2023-12-31 DIAGNOSIS — R6889 Other general symptoms and signs: Secondary | ICD-10-CM | POA: Insufficient documentation

## 2023-12-31 DIAGNOSIS — B9689 Other specified bacterial agents as the cause of diseases classified elsewhere: Secondary | ICD-10-CM

## 2023-12-31 LAB — POCT INFLUENZA A/B
Influenza A, POC: NEGATIVE
Influenza B, POC: NEGATIVE

## 2023-12-31 LAB — POC COVID19 BINAXNOW: SARS Coronavirus 2 Ag: NEGATIVE

## 2023-12-31 MED ORDER — AMOXICILLIN-POT CLAVULANATE 875-125 MG PO TABS: 1.0000 | ORAL_TABLET | Freq: Two times a day (BID) | ORAL | 0 refills | Status: AC

## 2023-12-31 NOTE — Progress Notes (Addendum)
 Oscar Smith 78 y.o.   Chief Complaint  Patient presents with   URI    patient has persistant drainage and sore throat. Had it before and got better but it has came back, pt states that he has no taste and feels nausea     HISTORY OF PRESENT ILLNESS: This is a 78 y.o. male complaining of flulike symptoms for 1 month Sinus congestion getting worse.  Has lost of taste with occasional nausea No other associated symptoms No other complaints or medical concerns today.  URI  Associated symptoms include congestion. Pertinent negatives include no abdominal pain, chest pain, coughing, diarrhea, dysuria, headaches, nausea, rash, sore throat or vomiting.     Prior to Admission medications   Medication Sig Start Date End Date Taking? Authorizing Provider  amLODipine  (NORVASC ) 5 MG tablet TAKE 1 TABLET BY MOUTH TWICE DAILY. 01/30/23  Yes Kate Lonni CROME, MD  aspirin EC 81 MG tablet Take 81 mg daily by mouth.   Yes [provider]  atorvastatin  (LIPITOR) 20 MG tablet TAKE ONE TABLET ONCE DAILY 02/13/23  Yes Kate Lonni CROME, MD  diphenhydramine-acetaminophen  (TYLENOL  PM) 25-500 MG TABS tablet Take 1 tablet by mouth at bedtime as needed (for sleep).   Yes [provider]  esomeprazole  (NEXIUM ) 40 MG capsule Take 1 capsule (40 mg total) by mouth daily. 09/25/23  Yes Heatherly Stenner, Emil Schanz, MD  fluticasone  (FLONASE ) 50 MCG/ACT nasal spray Place 2 sprays into both nostrils daily as needed for allergies or rhinitis. 07/15/23  Yes Bessie Boyte Jose, MD  hydrocortisone 2.5 % cream Apply topically as needed.   Yes [provider]  lisinopril  (ZESTRIL ) 40 MG tablet TAKE ONE TABLET BY MOUTH DAILY 08/21/23  Yes Markie Heffernan Jose, MD  LORazepam  (ATIVAN ) 1 MG tablet Take 1 tablet (1 mg total) by mouth daily as needed for anxiety. 06/29/23  Yes Maki Sweetser, Emil Schanz, MD  meloxicam  (MOBIC ) 15 MG tablet Take 1 tablet (15 mg total) by mouth daily. 09/24/23  Yes  Rodriguez-Southworth, Kyra, PA-C  Multiple Vitamin (MULTIVITAMIN) tablet Take 1 tablet by mouth daily.   Yes [provider]    Allergies  Allergen Reactions   Flomax [Tamsulosin Hcl] Other (See Comments)    bad reaction = jaw pain   Latex Itching and Rash    Patient Active Problem List   Diagnosis Date Noted   Bowel dysfunction 09/19/2023   History of prostate cancer 03/22/2023   Benign neoplasm of descending colon    Benign neoplasm of transverse colon    Situational depression 03/21/2021   Dyslipidemia 09/17/2019   History of diverticulosis 09/17/2019   Chronic sinus complaints 09/17/2019   Essential hypertension 07/03/2018   Pure hypercholesterolemia 07/03/2018   Osteoarthritis of right hip 02/07/2018   Enlarged prostate with urinary retention 12/25/2013   Erectile dysfunction 12/25/2013   Chronic sinusitis 02/20/2012   Frontal mucocele 02/20/2012   Nasal septal deviation 02/20/2012   ADENOCARCINOMA, PROSTATE, GLEASON GRADE 6 12/21/2009   GERD 11/12/2008    Past Medical History:  Diagnosis Date   BPH with obstruction/lower urinary tract symptoms    urologist--- dr alvaro   CAD (coronary artery disease)    cardiologist--- dr c. kate;  per  note native artery;   nuclear stress test 12-13-2020  low risk w/ fixed perfusion defect apex w/ hypokinesis consistant w/ prior infart,  normal wall motion,  nuclear ef 54%;   coronary CT 01-05-2020  score= 767   ED (erectile dysfunction) of organic origin  First degree heart block    GERD (gastroesophageal reflux disease)    Hiatal hernia    History of colon polyps    2002 and 2003 hyperplastic   History of condyloma acuminatum    penis   History of diverticulitis of colon    History of gastritis    Hypertension    Left bundle branch block    Malignant neoplasm prostate [facility]) 11/2009   urologist--- dr alvaro;   dx 11/ 2011,  gleason 3+3,  completed IMRT 03/ 2012   Penile cancer [facility])    urologist--- dr  alvaro ;    recurrent 11/ 2023   Wears glasses    Wears hearing aid in both ears     Past Surgical History:  Procedure Laterality Date   BIOPSY  08/04/2021   Procedure: BIOPSY;  Surgeon: Aneita Gwendlyn DASEN, MD;  Location: [facility];  Service: Gastroenterology;;   BAILEY N/A 06/09/2015   Procedure: CIRCUMCISION ADULT WITH PENILE BLOCK;  Surgeon: Ricardo Alvaro, MD;  Location: [facility];  Service: Urology;  Laterality: N/A;   COLONOSCOPY WITH PROPOFOL  N/A 08/04/2021   Procedure: COLONOSCOPY WITH PROPOFOL ;  Surgeon: Aneita Gwendlyn DASEN, MD;  Location: [facility];  Service: Gastroenterology;  Laterality: N/A;   CONDYLOMA EXCISION/FULGURATION N/A 06/09/2020   Procedure: EXCISION OF PENILE WARTS;  Surgeon: Alvaro Ricardo, MD;  Location: [facility];  Service: Urology;  Laterality: N/A;  45 MINS   ENDOVENOUS ABLATION SAPHENOUS VEIN W/ LASER Left 02/10/2016   endovenous laser ablation left greater saphenous vein and stab phlebectomy left leg by Krystal Doing MD   INGUINAL HERNIA REPAIR Right 12/23/2009   @[facility]  by dr merrilyn   LIPOMA EXCISION N/A 12/01/2016   Procedure: EXCISION POSTERIOR NECK MASS;  Surgeon: Arelia Filippo, MD;  Location: [facility];  Service: Plastics;  Laterality: N/A;   PENILE BIOPSY N/A 12/14/2021   Procedure: LOCAL EXCISION OF PENILE CANCER WITH PENILE BLOCK;  Surgeon: Alvaro Ricardo, MD;  Location: [facility];  Service: Urology;  Laterality: N/A;  1 HR   POLYPECTOMY  08/04/2021   Procedure: POLYPECTOMY;  Surgeon: Aneita Gwendlyn DASEN, MD;  Location: [facility];  Service: Gastroenterology;;   SHOULDER ARTHROSCOPY WITH SUBACROMIAL DECOMPRESSION, ROTATOR CUFF REPAIR AND BICEP TENDON REPAIR Left 10/2014    Social History   Socioeconomic History   Marital status: Married    Spouse name: Not on file   Number of children: 0   Years of education: BS   Highest education level: Bachelor's degree (e.g., BA, AB,  BS)  Occupational History   Occupation: PSYCHOLOGIST, EDUCATIONAL   Occupationeducation officer, environmental  Tobacco Use   Smoking status: Former    Current packs/day: 0.00    Average packs/day: 0.5 packs/day for 2.0 years (1.0 ttl pk-yrs)    Types: Cigarettes    Start date: 09/05/1963    Quit date: 09/04/1965    Years since quitting: 58.3   Smokeless tobacco: Never   Tobacco comments:    quit around 78 yrs old.  Vaping Use   Vaping status: Never Used  Substance and Sexual Activity   Alcohol use: Yes    Alcohol/week: 2.0 standard drinks of alcohol    Types: 1 Glasses of wine, 1 Cans of beer per week    Comment: drink 1-2 at night    Drug use: Never   Sexual activity: Yes    Partners: Female  Other Topics Concern   Not on file  Social History Narrative   **  Merged History Encounter **       Originally from [place].   Trained as an art gallery manager.    Married '75- 11 years, divorced; Married '86. No children.    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 Sculptor-life size figures    by commission (see work in [place]  Sculpting work regularly.   Never smoking.   Alcohol: drinks1- 2 beers and 1-2 wines every night;    Learning the piano. Patient get regular exercise.   Wife is a engineer, civil (consulting).   Caffeine: 3/day    ADLs: drives; independent with ADLs   Advanced Directives: YES; FULL CODE; no prolonged measures.        Social Drivers of Corporate Investment Banker Strain: Low Risk  (12/30/2023)   Overall Financial Resource Strain (CARDIA)    Difficulty of Paying Living Expenses: Not very hard  Food Insecurity: No Food Insecurity (12/30/2023)   Hunger Vital Sign    Worried About Running Out of Food in the Last Year: Never true    Ran Out of Food in the Last Year: Never true  Transportation Needs: No Transportation Needs (12/30/2023)   PRAPARE - Administrator, Civil Service (Medical): No    Lack of Transportation (Non-Medical):  No  Physical Activity: Insufficiently Active (12/30/2023)   Exercise Vital Sign    Days of Exercise per Week: 2 days    Minutes of Exercise per Session: 20 min  Stress: No Stress Concern Present (12/30/2023)   [employer] of Occupational Health - Occupational Stress Questionnaire    Feeling of Stress: Only a little  Social Connections: Socially Isolated (12/30/2023)   Social Connection and Isolation Panel    Frequency of Communication with Friends and Family: Never    Frequency of Social Gatherings with Friends and Family: Never    Attends Religious Services: Never    Database Administrator or Organizations: No    Attends Engineer, Structural: Not on file    Marital Status: Married  Catering Manager Violence: Not At Risk (07/23/2023)   Humiliation, Afraid, Rape, and Kick questionnaire    Fear of Current or Ex-Partner: No    Emotionally Abused: No    Physically Abused: No    Sexually Abused: No    Family History  Problem Relation Age of Onset   Cancer Father 102       stomach versus colon cancer   Pancreatic cancer Sister 36   Cancer Brother        penile cancer   Esophageal cancer Neg Hx    Throat cancer Neg Hx      Review of Systems  Constitutional: Negative.  Negative for chills and fever.  HENT:  Positive for congestion. Negative for sore throat.   Respiratory: Negative.  Negative for cough and shortness of breath.   Cardiovascular: Negative.  Negative for chest pain and palpitations.  Gastrointestinal:  Negative for abdominal pain, diarrhea, nausea and vomiting.  Genitourinary: Negative.  Negative for dysuria and hematuria.  Skin: Negative.  Negative for rash.  Neurological: Negative.  Negative for dizziness and headaches.  All other systems reviewed and are negative.   Vitals:   12/31/23 1011  BP: 126/80  Pulse: 67  Temp: 97.7 F (36.5 C)  SpO2: 97%    Physical Exam Vitals reviewed.  Constitutional:      Appearance: Normal appearance.  HENT:      Head: Normocephalic.  Mouth/Throat:     Mouth: Mucous membranes are moist.     Pharynx: Oropharynx is clear.  Eyes:     Extraocular Movements: Extraocular movements intact.     Conjunctiva/sclera: Conjunctivae normal.     Pupils: Pupils are equal, round, and reactive to light.  Cardiovascular:     Rate and Rhythm: Normal rate and regular rhythm.     Pulses: Normal pulses.     Heart sounds: Normal heart sounds.  Pulmonary:     Effort: Pulmonary effort is normal.     Breath sounds: Normal breath sounds.  Musculoskeletal:     Cervical back: No tenderness.  Lymphadenopathy:     Cervical: No cervical adenopathy.  Skin:    General: Skin is warm and dry.     Capillary Refill: Capillary refill takes less than 2 seconds.  Neurological:     General: No focal deficit present.     Mental Status: He is alert and oriented to person, place, and time.  Psychiatric:        Mood and Affect: Mood normal.        Behavior: Behavior normal.    Results for orders placed or performed in visit on 12/31/23 (from the past 24 hours)  POC COVID-19     Status: Normal   Collection Time: 12/31/23 10:43 AM  Result Value Ref Range   SARS Coronavirus 2 Ag Negative Negative  POCT Influenza A/B     Status: Normal   Collection Time: 12/31/23 10:43 AM  Result Value Ref Range   Influenza A, POC Negative Negative   Influenza B, POC Negative Negative     ASSESSMENT & PLAN: Problem List Items Addressed This Visit       Respiratory   Bacterial sinusitis - Primary   Upper viral respiratory infection now with secondary bacterial sinus infection Recommend Augmentin  875 mg twice a day for at least 7 days Symptom management discussed Advised to rest and stay well-hydrated      Relevant Medications   amoxicillin -clavulanate (AUGMENTIN ) 875-125 MG tablet     Other   Flu-like symptoms   Negative COVID and flu tests Symptom management discussed Advised to rest and stay well-hydrated Use Flonase  as  needed.  Recommend frequent daily saline spray use Advised to contact the office if no better or worse during the next several days      Relevant Orders   POC COVID-19   POCT Influenza A/B   Patient Instructions  Sinus Infection, Adult A sinus infection is soreness and swelling (inflammation) of your sinuses. Sinuses are hollow spaces in the bones around your face. They are located: Around your eyes. In the middle of your forehead. Behind your nose. In your cheekbones. Your sinuses and nasal passages are lined with a fluid called mucus. Mucus drains out of your sinuses. Swelling can trap mucus in your sinuses. This lets germs (bacteria, virus, or fungus) grow, which leads to infection. Most of the time, this condition is caused by a virus. What are the causes? Allergies. Asthma. Germs. Things that block your nose or sinuses. Growths in the nose (nasal polyps). Chemicals or irritants in the air. A fungus. This is rare. What increases the risk? Having a weak body defense system (immune system). Doing a lot of swimming or diving. Using nasal sprays too much. Smoking. What are the signs or symptoms? The main symptoms of this condition are pain and a feeling of pressure around the sinuses. Other symptoms include: Stuffy nose (congestion). This may make  it hard to breathe through your nose. Runny nose (drainage). Soreness, swelling, and warmth in the sinuses. A cough that may get worse at night. Being unable to smell and taste. Mucus that collects in the throat or the back of the nose (postnasal drip). This may cause a sore throat or bad breath. Being very tired (fatigued). A fever. How is this diagnosed? Your symptoms. Your medical history. A physical exam. Tests to find out if your condition is short-term (acute) or long-term (chronic). Your doctor may: Check your nose for growths (polyps). Check your sinuses using a tool that has a light on one end (endoscope). Check for  allergies or germs. Do imaging tests, such as an MRI or CT scan. How is this treated? Treatment for this condition depends on the cause and whether it is short-term or long-term. If caused by a virus, your symptoms should go away on their own within 10 days. You may be given medicines to relieve symptoms. They include: Medicines that shrink swollen tissue in the nose. A spray that treats swelling of the nostrils. Rinses that help get rid of thick mucus in your nose (nasal saline washes). Medicines that treat allergies (antihistamines). Over-the-counter pain relievers. If caused by bacteria, your doctor may wait to see if you will get better without treatment. You may be given antibiotic medicine if you have: A very bad infection. A weak body defense system. If caused by growths in the nose, surgery may be needed. Follow these instructions at home: Medicines Take, use, or apply over-the-counter and prescription medicines only as told by your doctor. These may include nasal sprays. If you were prescribed an antibiotic medicine, take it as told by your doctor. Do not stop taking it even if you start to feel better. Hydrate and humidify  Drink enough water to keep your pee (urine) pale yellow. Use a cool mist humidifier to keep the humidity level in your home above 50%. Breathe in steam for 10-15 minutes, 3-4 times a day, or as told by your doctor. You can do this in the bathroom while a hot shower is running. Try not to spend time in cool or dry air. Rest Rest as much as you can. Sleep with your head raised (elevated). Make sure you get enough sleep each night. General instructions  Put a warm, moist washcloth on your face 3-4 times a day, or as often as told by your doctor. Use nasal saline washes as often as told by your doctor. Wash your hands often with soap and water. If you cannot use soap and water, use hand sanitizer. Do not smoke. Avoid being around people who are smoking  (secondhand smoke). Keep all follow-up visits. Contact a doctor if: You have a fever. Your symptoms get worse. Your symptoms do not get better within 10 days. Get help right away if: You have a very bad headache. You cannot stop vomiting. You have very bad pain or swelling around your face or eyes. You have trouble seeing. You feel confused. Your neck is stiff. You have trouble breathing. These symptoms may be an emergency. Get help right away. Call 911. Do not wait to see if the symptoms will go away. Do not drive yourself to the hospital. Summary A sinus infection is swelling of your sinuses. Sinuses are hollow spaces in the bones around your face. This condition is caused by tissues in your nose that become inflamed or swollen. This traps germs. These can lead to infection. If you were prescribed  an antibiotic medicine, take it as told by your doctor. Do not stop taking it even if you start to feel better. Keep all follow-up visits. This information is not intended to replace advice given to you by your health care provider. Make sure you discuss any questions you have with your health care provider. Document Revised: 12/14/2020 Document Reviewed: 12/14/2020 Elsevier Patient Education  2024 Elsevier Inc.     Emil Schaumann, MD [facility]

## 2023-12-31 NOTE — Assessment & Plan Note (Signed)
 Upper viral respiratory infection now with secondary bacterial sinus infection Recommend Augmentin  875 mg twice a day for at least 7 days Symptom management discussed Advised to rest and stay well-hydrated

## 2023-12-31 NOTE — Assessment & Plan Note (Signed)
 Negative COVID and flu tests Symptom management discussed Advised to rest and stay well-hydrated Use Flonase  as needed.  Recommend frequent daily saline spray use Advised to contact the office if no better or worse during the next several days

## 2023-12-31 NOTE — Patient Instructions (Signed)

## 2024-02-05 ENCOUNTER — Other Ambulatory Visit: Payer: Self-pay | Admitting: Emergency Medicine

## 2024-02-10 ENCOUNTER — Other Ambulatory Visit: Payer: Self-pay | Admitting: Cardiology

## 2024-02-13 ENCOUNTER — Encounter: Payer: Self-pay | Admitting: Emergency Medicine

## 2024-02-13 ENCOUNTER — Other Ambulatory Visit: Payer: Self-pay | Admitting: Emergency Medicine

## 2024-02-13 MED ORDER — AMLODIPINE BESYLATE 5 MG PO TABS: 5.0000 mg | ORAL_TABLET | Freq: Every day | ORAL | 3 refills | Status: AC

## 2024-02-13 NOTE — Telephone Encounter (Signed)
 New prescription for amlodipine  sent to pharmacy requested today.  Thanks

## 2024-02-19 ENCOUNTER — Other Ambulatory Visit: Payer: Self-pay | Admitting: Emergency Medicine

## 2024-03-24 ENCOUNTER — Ambulatory Visit: Admitting: Emergency Medicine

## 2024-09-26 ENCOUNTER — Ambulatory Visit
